# Patient Record
Sex: Male | Born: 1945
Health system: Southern US, Community
[De-identification: ages and names within clinical notes are randomized; demographics above are authoritative.]

## PROBLEM LIST (undated history)

## (undated) DIAGNOSIS — M1990 Unspecified osteoarthritis, unspecified site: Secondary | ICD-10-CM

## (undated) HISTORY — PX: CATARACT EXTRACTION, BILATERAL: SHX1313

---

## 2011-06-30 DIAGNOSIS — M069 Rheumatoid arthritis, unspecified: Secondary | ICD-10-CM | POA: Diagnosis not present

## 2011-10-29 DIAGNOSIS — M069 Rheumatoid arthritis, unspecified: Secondary | ICD-10-CM | POA: Diagnosis not present

## 2011-11-12 DIAGNOSIS — E785 Hyperlipidemia, unspecified: Secondary | ICD-10-CM | POA: Diagnosis not present

## 2011-11-12 DIAGNOSIS — Z125 Encounter for screening for malignant neoplasm of prostate: Secondary | ICD-10-CM | POA: Diagnosis not present

## 2011-11-12 DIAGNOSIS — J309 Allergic rhinitis, unspecified: Secondary | ICD-10-CM | POA: Diagnosis not present

## 2011-11-12 DIAGNOSIS — F17200 Nicotine dependence, unspecified, uncomplicated: Secondary | ICD-10-CM | POA: Diagnosis not present

## 2011-11-26 DIAGNOSIS — Z125 Encounter for screening for malignant neoplasm of prostate: Secondary | ICD-10-CM | POA: Diagnosis not present

## 2011-11-26 DIAGNOSIS — E785 Hyperlipidemia, unspecified: Secondary | ICD-10-CM | POA: Diagnosis not present

## 2011-11-26 DIAGNOSIS — F17200 Nicotine dependence, unspecified, uncomplicated: Secondary | ICD-10-CM | POA: Diagnosis not present

## 2011-11-26 DIAGNOSIS — Z0000 Encounter for general adult medical examination without abnormal findings: Secondary | ICD-10-CM | POA: Diagnosis not present

## 2011-11-26 DIAGNOSIS — Z23 Encounter for immunization: Secondary | ICD-10-CM | POA: Diagnosis not present

## 2011-11-26 DIAGNOSIS — E663 Overweight: Secondary | ICD-10-CM | POA: Diagnosis not present

## 2011-12-03 DIAGNOSIS — M069 Rheumatoid arthritis, unspecified: Secondary | ICD-10-CM | POA: Diagnosis not present

## 2011-12-03 DIAGNOSIS — J309 Allergic rhinitis, unspecified: Secondary | ICD-10-CM | POA: Diagnosis not present

## 2011-12-03 DIAGNOSIS — N182 Chronic kidney disease, stage 2 (mild): Secondary | ICD-10-CM | POA: Diagnosis not present

## 2011-12-03 DIAGNOSIS — F17200 Nicotine dependence, unspecified, uncomplicated: Secondary | ICD-10-CM | POA: Diagnosis not present

## 2011-12-10 DIAGNOSIS — Z1211 Encounter for screening for malignant neoplasm of colon: Secondary | ICD-10-CM | POA: Diagnosis not present

## 2011-12-23 DIAGNOSIS — K649 Unspecified hemorrhoids: Secondary | ICD-10-CM | POA: Diagnosis not present

## 2011-12-23 DIAGNOSIS — K5730 Diverticulosis of large intestine without perforation or abscess without bleeding: Secondary | ICD-10-CM | POA: Diagnosis not present

## 2011-12-23 DIAGNOSIS — D126 Benign neoplasm of colon, unspecified: Secondary | ICD-10-CM | POA: Diagnosis not present

## 2011-12-23 DIAGNOSIS — Z1211 Encounter for screening for malignant neoplasm of colon: Secondary | ICD-10-CM | POA: Diagnosis not present

## 2012-01-04 DIAGNOSIS — S61209A Unspecified open wound of unspecified finger without damage to nail, initial encounter: Secondary | ICD-10-CM | POA: Diagnosis not present

## 2012-02-02 DIAGNOSIS — M069 Rheumatoid arthritis, unspecified: Secondary | ICD-10-CM | POA: Diagnosis not present

## 2012-04-13 DIAGNOSIS — H2510 Age-related nuclear cataract, unspecified eye: Secondary | ICD-10-CM | POA: Diagnosis not present

## 2012-05-28 DIAGNOSIS — H269 Unspecified cataract: Secondary | ICD-10-CM | POA: Diagnosis not present

## 2012-05-28 DIAGNOSIS — H2510 Age-related nuclear cataract, unspecified eye: Secondary | ICD-10-CM | POA: Diagnosis not present

## 2012-06-03 DIAGNOSIS — M069 Rheumatoid arthritis, unspecified: Secondary | ICD-10-CM | POA: Diagnosis not present

## 2012-09-30 DIAGNOSIS — M069 Rheumatoid arthritis, unspecified: Secondary | ICD-10-CM | POA: Diagnosis not present

## 2012-09-30 DIAGNOSIS — M79609 Pain in unspecified limb: Secondary | ICD-10-CM | POA: Diagnosis not present

## 2012-10-06 DIAGNOSIS — M069 Rheumatoid arthritis, unspecified: Secondary | ICD-10-CM | POA: Diagnosis not present

## 2012-10-06 DIAGNOSIS — Z9221 Personal history of antineoplastic chemotherapy: Secondary | ICD-10-CM | POA: Diagnosis not present

## 2012-12-01 DIAGNOSIS — Z125 Encounter for screening for malignant neoplasm of prostate: Secondary | ICD-10-CM | POA: Diagnosis not present

## 2012-12-01 DIAGNOSIS — N182 Chronic kidney disease, stage 2 (mild): Secondary | ICD-10-CM | POA: Diagnosis not present

## 2012-12-01 DIAGNOSIS — Z1331 Encounter for screening for depression: Secondary | ICD-10-CM | POA: Diagnosis not present

## 2012-12-01 DIAGNOSIS — E785 Hyperlipidemia, unspecified: Secondary | ICD-10-CM | POA: Diagnosis not present

## 2012-12-01 DIAGNOSIS — Z0000 Encounter for general adult medical examination without abnormal findings: Secondary | ICD-10-CM | POA: Diagnosis not present

## 2012-12-01 DIAGNOSIS — Z23 Encounter for immunization: Secondary | ICD-10-CM | POA: Diagnosis not present

## 2012-12-08 DIAGNOSIS — F17200 Nicotine dependence, unspecified, uncomplicated: Secondary | ICD-10-CM | POA: Diagnosis not present

## 2012-12-08 DIAGNOSIS — N182 Chronic kidney disease, stage 2 (mild): Secondary | ICD-10-CM | POA: Diagnosis not present

## 2012-12-08 DIAGNOSIS — E785 Hyperlipidemia, unspecified: Secondary | ICD-10-CM | POA: Diagnosis not present

## 2012-12-08 DIAGNOSIS — J309 Allergic rhinitis, unspecified: Secondary | ICD-10-CM | POA: Diagnosis not present

## 2012-12-30 DIAGNOSIS — M069 Rheumatoid arthritis, unspecified: Secondary | ICD-10-CM | POA: Diagnosis not present

## 2013-03-31 DIAGNOSIS — M069 Rheumatoid arthritis, unspecified: Secondary | ICD-10-CM | POA: Diagnosis not present

## 2013-08-01 DIAGNOSIS — R059 Cough, unspecified: Secondary | ICD-10-CM | POA: Diagnosis not present

## 2013-08-01 DIAGNOSIS — R05 Cough: Secondary | ICD-10-CM | POA: Diagnosis not present

## 2013-08-01 DIAGNOSIS — J988 Other specified respiratory disorders: Secondary | ICD-10-CM | POA: Diagnosis not present

## 2013-08-01 DIAGNOSIS — M069 Rheumatoid arthritis, unspecified: Secondary | ICD-10-CM | POA: Diagnosis not present

## 2013-11-28 DIAGNOSIS — M069 Rheumatoid arthritis, unspecified: Secondary | ICD-10-CM | POA: Diagnosis not present

## 2013-12-08 DIAGNOSIS — W57XXXA Bitten or stung by nonvenomous insect and other nonvenomous arthropods, initial encounter: Secondary | ICD-10-CM | POA: Diagnosis not present

## 2013-12-08 DIAGNOSIS — E785 Hyperlipidemia, unspecified: Secondary | ICD-10-CM | POA: Diagnosis not present

## 2013-12-08 DIAGNOSIS — E669 Obesity, unspecified: Secondary | ICD-10-CM | POA: Diagnosis not present

## 2013-12-08 DIAGNOSIS — Z125 Encounter for screening for malignant neoplasm of prostate: Secondary | ICD-10-CM | POA: Diagnosis not present

## 2013-12-08 DIAGNOSIS — N182 Chronic kidney disease, stage 2 (mild): Secondary | ICD-10-CM | POA: Diagnosis not present

## 2013-12-08 DIAGNOSIS — Z1331 Encounter for screening for depression: Secondary | ICD-10-CM | POA: Diagnosis not present

## 2013-12-08 DIAGNOSIS — Z0000 Encounter for general adult medical examination without abnormal findings: Secondary | ICD-10-CM | POA: Diagnosis not present

## 2013-12-08 DIAGNOSIS — T148 Other injury of unspecified body region: Secondary | ICD-10-CM | POA: Diagnosis not present

## 2013-12-08 DIAGNOSIS — F17200 Nicotine dependence, unspecified, uncomplicated: Secondary | ICD-10-CM | POA: Diagnosis not present

## 2013-12-15 DIAGNOSIS — N182 Chronic kidney disease, stage 2 (mild): Secondary | ICD-10-CM | POA: Diagnosis not present

## 2013-12-15 DIAGNOSIS — F17200 Nicotine dependence, unspecified, uncomplicated: Secondary | ICD-10-CM | POA: Diagnosis not present

## 2013-12-15 DIAGNOSIS — J309 Allergic rhinitis, unspecified: Secondary | ICD-10-CM | POA: Diagnosis not present

## 2013-12-15 DIAGNOSIS — M069 Rheumatoid arthritis, unspecified: Secondary | ICD-10-CM | POA: Diagnosis not present

## 2014-04-03 DIAGNOSIS — M0589 Other rheumatoid arthritis with rheumatoid factor of multiple sites: Secondary | ICD-10-CM | POA: Diagnosis not present

## 2014-08-14 DIAGNOSIS — M25521 Pain in right elbow: Secondary | ICD-10-CM | POA: Diagnosis not present

## 2014-08-14 DIAGNOSIS — M0589 Other rheumatoid arthritis with rheumatoid factor of multiple sites: Secondary | ICD-10-CM | POA: Diagnosis not present

## 2014-12-05 DIAGNOSIS — L570 Actinic keratosis: Secondary | ICD-10-CM | POA: Diagnosis not present

## 2014-12-05 DIAGNOSIS — Z1283 Encounter for screening for malignant neoplasm of skin: Secondary | ICD-10-CM | POA: Diagnosis not present

## 2014-12-05 DIAGNOSIS — S80869A Insect bite (nonvenomous), unspecified lower leg, initial encounter: Secondary | ICD-10-CM | POA: Diagnosis not present

## 2014-12-12 DIAGNOSIS — Z1389 Encounter for screening for other disorder: Secondary | ICD-10-CM | POA: Diagnosis not present

## 2014-12-12 DIAGNOSIS — F17210 Nicotine dependence, cigarettes, uncomplicated: Secondary | ICD-10-CM | POA: Diagnosis not present

## 2014-12-12 DIAGNOSIS — Z23 Encounter for immunization: Secondary | ICD-10-CM | POA: Diagnosis not present

## 2014-12-12 DIAGNOSIS — N182 Chronic kidney disease, stage 2 (mild): Secondary | ICD-10-CM | POA: Diagnosis not present

## 2014-12-12 DIAGNOSIS — Z13820 Encounter for screening for osteoporosis: Secondary | ICD-10-CM | POA: Diagnosis not present

## 2014-12-12 DIAGNOSIS — E785 Hyperlipidemia, unspecified: Secondary | ICD-10-CM | POA: Diagnosis not present

## 2014-12-12 DIAGNOSIS — Z125 Encounter for screening for malignant neoplasm of prostate: Secondary | ICD-10-CM | POA: Diagnosis not present

## 2014-12-12 DIAGNOSIS — Z0000 Encounter for general adult medical examination without abnormal findings: Secondary | ICD-10-CM | POA: Diagnosis not present

## 2014-12-13 DIAGNOSIS — M0589 Other rheumatoid arthritis with rheumatoid factor of multiple sites: Secondary | ICD-10-CM | POA: Diagnosis not present

## 2014-12-19 DIAGNOSIS — N182 Chronic kidney disease, stage 2 (mild): Secondary | ICD-10-CM | POA: Diagnosis not present

## 2014-12-19 DIAGNOSIS — J449 Chronic obstructive pulmonary disease, unspecified: Secondary | ICD-10-CM | POA: Diagnosis not present

## 2014-12-19 DIAGNOSIS — M8580 Other specified disorders of bone density and structure, unspecified site: Secondary | ICD-10-CM | POA: Diagnosis not present

## 2014-12-19 DIAGNOSIS — F17210 Nicotine dependence, cigarettes, uncomplicated: Secondary | ICD-10-CM | POA: Diagnosis not present

## 2015-09-05 DIAGNOSIS — R05 Cough: Secondary | ICD-10-CM | POA: Diagnosis not present

## 2015-09-05 DIAGNOSIS — Z79899 Other long term (current) drug therapy: Secondary | ICD-10-CM | POA: Diagnosis not present

## 2015-09-05 DIAGNOSIS — R062 Wheezing: Secondary | ICD-10-CM | POA: Diagnosis not present

## 2015-09-05 DIAGNOSIS — M19041 Primary osteoarthritis, right hand: Secondary | ICD-10-CM | POA: Diagnosis not present

## 2015-09-05 DIAGNOSIS — M0589 Other rheumatoid arthritis with rheumatoid factor of multiple sites: Secondary | ICD-10-CM | POA: Diagnosis not present

## 2015-09-05 DIAGNOSIS — R683 Clubbing of fingers: Secondary | ICD-10-CM | POA: Diagnosis not present

## 2015-09-05 DIAGNOSIS — M19042 Primary osteoarthritis, left hand: Secondary | ICD-10-CM | POA: Diagnosis not present

## 2015-12-13 DIAGNOSIS — J302 Other seasonal allergic rhinitis: Secondary | ICD-10-CM | POA: Diagnosis not present

## 2015-12-13 DIAGNOSIS — Z0000 Encounter for general adult medical examination without abnormal findings: Secondary | ICD-10-CM | POA: Diagnosis not present

## 2015-12-13 DIAGNOSIS — Z1211 Encounter for screening for malignant neoplasm of colon: Secondary | ICD-10-CM | POA: Diagnosis not present

## 2015-12-13 DIAGNOSIS — Z1389 Encounter for screening for other disorder: Secondary | ICD-10-CM | POA: Diagnosis not present

## 2015-12-13 DIAGNOSIS — Z125 Encounter for screening for malignant neoplasm of prostate: Secondary | ICD-10-CM | POA: Diagnosis not present

## 2015-12-13 DIAGNOSIS — E663 Overweight: Secondary | ICD-10-CM | POA: Diagnosis not present

## 2015-12-13 DIAGNOSIS — E785 Hyperlipidemia, unspecified: Secondary | ICD-10-CM | POA: Diagnosis not present

## 2015-12-13 DIAGNOSIS — N182 Chronic kidney disease, stage 2 (mild): Secondary | ICD-10-CM | POA: Diagnosis not present

## 2015-12-19 DIAGNOSIS — R683 Clubbing of fingers: Secondary | ICD-10-CM | POA: Diagnosis not present

## 2015-12-19 DIAGNOSIS — Z79899 Other long term (current) drug therapy: Secondary | ICD-10-CM | POA: Diagnosis not present

## 2015-12-19 DIAGNOSIS — M0589 Other rheumatoid arthritis with rheumatoid factor of multiple sites: Secondary | ICD-10-CM | POA: Diagnosis not present

## 2015-12-19 DIAGNOSIS — R062 Wheezing: Secondary | ICD-10-CM | POA: Diagnosis not present

## 2015-12-20 DIAGNOSIS — F17210 Nicotine dependence, cigarettes, uncomplicated: Secondary | ICD-10-CM | POA: Diagnosis not present

## 2015-12-20 DIAGNOSIS — E785 Hyperlipidemia, unspecified: Secondary | ICD-10-CM | POA: Diagnosis not present

## 2015-12-20 DIAGNOSIS — J449 Chronic obstructive pulmonary disease, unspecified: Secondary | ICD-10-CM | POA: Diagnosis not present

## 2015-12-20 DIAGNOSIS — R0989 Other specified symptoms and signs involving the circulatory and respiratory systems: Secondary | ICD-10-CM | POA: Diagnosis not present

## 2015-12-20 DIAGNOSIS — N182 Chronic kidney disease, stage 2 (mild): Secondary | ICD-10-CM | POA: Diagnosis not present

## 2015-12-21 ENCOUNTER — Other Ambulatory Visit: Payer: Self-pay | Admitting: Internal Medicine

## 2015-12-21 DIAGNOSIS — R0989 Other specified symptoms and signs involving the circulatory and respiratory systems: Secondary | ICD-10-CM

## 2015-12-31 DIAGNOSIS — M059 Rheumatoid arthritis with rheumatoid factor, unspecified: Secondary | ICD-10-CM | POA: Diagnosis not present

## 2015-12-31 DIAGNOSIS — Z86010 Personal history of colonic polyps: Secondary | ICD-10-CM | POA: Diagnosis not present

## 2015-12-31 DIAGNOSIS — J309 Allergic rhinitis, unspecified: Secondary | ICD-10-CM | POA: Diagnosis not present

## 2016-01-07 ENCOUNTER — Ambulatory Visit
Admission: RE | Admit: 2016-01-07 | Discharge: 2016-01-07 | Disposition: A | Discharge status: Home / Self Care | Payer: Medicare Other | Source: Ambulatory Visit | Attending: Internal Medicine | Admitting: Internal Medicine

## 2016-01-07 DIAGNOSIS — R0989 Other specified symptoms and signs involving the circulatory and respiratory systems: Secondary | ICD-10-CM

## 2016-01-07 DIAGNOSIS — Z136 Encounter for screening for cardiovascular disorders: Secondary | ICD-10-CM | POA: Diagnosis not present

## 2016-01-07 DIAGNOSIS — I714 Abdominal aortic aneurysm, without rupture: Secondary | ICD-10-CM | POA: Diagnosis not present

## 2016-02-07 DIAGNOSIS — Z1211 Encounter for screening for malignant neoplasm of colon: Secondary | ICD-10-CM | POA: Diagnosis not present

## 2016-02-07 DIAGNOSIS — D123 Benign neoplasm of transverse colon: Secondary | ICD-10-CM | POA: Diagnosis not present

## 2016-02-07 DIAGNOSIS — K635 Polyp of colon: Secondary | ICD-10-CM | POA: Diagnosis not present

## 2016-02-07 DIAGNOSIS — Z86010 Personal history of colonic polyps: Secondary | ICD-10-CM | POA: Diagnosis not present

## 2016-03-20 DIAGNOSIS — Z79899 Other long term (current) drug therapy: Secondary | ICD-10-CM | POA: Diagnosis not present

## 2016-03-20 DIAGNOSIS — M0589 Other rheumatoid arthritis with rheumatoid factor of multiple sites: Secondary | ICD-10-CM | POA: Diagnosis not present

## 2016-03-20 DIAGNOSIS — R683 Clubbing of fingers: Secondary | ICD-10-CM | POA: Diagnosis not present

## 2016-03-20 DIAGNOSIS — R062 Wheezing: Secondary | ICD-10-CM | POA: Diagnosis not present

## 2016-06-19 DIAGNOSIS — M0589 Other rheumatoid arthritis with rheumatoid factor of multiple sites: Secondary | ICD-10-CM | POA: Diagnosis not present

## 2016-09-17 DIAGNOSIS — M0589 Other rheumatoid arthritis with rheumatoid factor of multiple sites: Secondary | ICD-10-CM | POA: Diagnosis not present

## 2016-09-17 DIAGNOSIS — M8580 Other specified disorders of bone density and structure, unspecified site: Secondary | ICD-10-CM | POA: Diagnosis not present

## 2016-09-17 DIAGNOSIS — Z79899 Other long term (current) drug therapy: Secondary | ICD-10-CM | POA: Diagnosis not present

## 2016-12-15 DIAGNOSIS — M0589 Other rheumatoid arthritis with rheumatoid factor of multiple sites: Secondary | ICD-10-CM | POA: Diagnosis not present

## 2017-01-01 DIAGNOSIS — M859 Disorder of bone density and structure, unspecified: Secondary | ICD-10-CM | POA: Diagnosis not present

## 2017-01-01 DIAGNOSIS — Z125 Encounter for screening for malignant neoplasm of prostate: Secondary | ICD-10-CM | POA: Diagnosis not present

## 2017-01-01 DIAGNOSIS — M8580 Other specified disorders of bone density and structure, unspecified site: Secondary | ICD-10-CM | POA: Diagnosis not present

## 2017-01-01 DIAGNOSIS — E785 Hyperlipidemia, unspecified: Secondary | ICD-10-CM | POA: Diagnosis not present

## 2017-01-08 DIAGNOSIS — N182 Chronic kidney disease, stage 2 (mild): Secondary | ICD-10-CM | POA: Diagnosis not present

## 2017-01-08 DIAGNOSIS — I700 Atherosclerosis of aorta: Secondary | ICD-10-CM | POA: Diagnosis not present

## 2017-01-08 DIAGNOSIS — M0589 Other rheumatoid arthritis with rheumatoid factor of multiple sites: Secondary | ICD-10-CM | POA: Diagnosis not present

## 2017-01-08 DIAGNOSIS — M8580 Other specified disorders of bone density and structure, unspecified site: Secondary | ICD-10-CM | POA: Diagnosis not present

## 2017-03-19 DIAGNOSIS — M0589 Other rheumatoid arthritis with rheumatoid factor of multiple sites: Secondary | ICD-10-CM | POA: Diagnosis not present

## 2017-03-19 DIAGNOSIS — Z79899 Other long term (current) drug therapy: Secondary | ICD-10-CM | POA: Diagnosis not present

## 2017-03-19 DIAGNOSIS — M8580 Other specified disorders of bone density and structure, unspecified site: Secondary | ICD-10-CM | POA: Diagnosis not present

## 2017-06-18 DIAGNOSIS — M0589 Other rheumatoid arthritis with rheumatoid factor of multiple sites: Secondary | ICD-10-CM | POA: Diagnosis not present

## 2017-09-16 DIAGNOSIS — M8580 Other specified disorders of bone density and structure, unspecified site: Secondary | ICD-10-CM | POA: Diagnosis not present

## 2017-09-16 DIAGNOSIS — Z79899 Other long term (current) drug therapy: Secondary | ICD-10-CM | POA: Diagnosis not present

## 2017-09-16 DIAGNOSIS — M0589 Other rheumatoid arthritis with rheumatoid factor of multiple sites: Secondary | ICD-10-CM | POA: Diagnosis not present

## 2017-09-16 DIAGNOSIS — M1990 Unspecified osteoarthritis, unspecified site: Secondary | ICD-10-CM | POA: Diagnosis not present

## 2017-09-16 DIAGNOSIS — M25562 Pain in left knee: Secondary | ICD-10-CM | POA: Diagnosis not present

## 2018-01-05 DIAGNOSIS — E785 Hyperlipidemia, unspecified: Secondary | ICD-10-CM | POA: Diagnosis not present

## 2018-01-05 DIAGNOSIS — M0589 Other rheumatoid arthritis with rheumatoid factor of multiple sites: Secondary | ICD-10-CM | POA: Diagnosis not present

## 2018-01-05 DIAGNOSIS — Z79899 Other long term (current) drug therapy: Secondary | ICD-10-CM | POA: Diagnosis not present

## 2018-01-05 DIAGNOSIS — Z125 Encounter for screening for malignant neoplasm of prostate: Secondary | ICD-10-CM | POA: Diagnosis not present

## 2018-01-12 DIAGNOSIS — Z0000 Encounter for general adult medical examination without abnormal findings: Secondary | ICD-10-CM | POA: Diagnosis not present

## 2018-01-12 DIAGNOSIS — M0589 Other rheumatoid arthritis with rheumatoid factor of multiple sites: Secondary | ICD-10-CM | POA: Diagnosis not present

## 2018-01-12 DIAGNOSIS — N182 Chronic kidney disease, stage 2 (mild): Secondary | ICD-10-CM | POA: Diagnosis not present

## 2018-01-12 DIAGNOSIS — E785 Hyperlipidemia, unspecified: Secondary | ICD-10-CM | POA: Diagnosis not present

## 2018-03-17 DIAGNOSIS — M1990 Unspecified osteoarthritis, unspecified site: Secondary | ICD-10-CM | POA: Diagnosis not present

## 2018-03-17 DIAGNOSIS — M25562 Pain in left knee: Secondary | ICD-10-CM | POA: Diagnosis not present

## 2018-03-17 DIAGNOSIS — M0589 Other rheumatoid arthritis with rheumatoid factor of multiple sites: Secondary | ICD-10-CM | POA: Diagnosis not present

## 2018-03-17 DIAGNOSIS — Z79899 Other long term (current) drug therapy: Secondary | ICD-10-CM | POA: Diagnosis not present

## 2018-03-17 DIAGNOSIS — M8580 Other specified disorders of bone density and structure, unspecified site: Secondary | ICD-10-CM | POA: Diagnosis not present

## 2018-04-13 DIAGNOSIS — M898X1 Other specified disorders of bone, shoulder: Secondary | ICD-10-CM | POA: Diagnosis not present

## 2018-04-13 DIAGNOSIS — Z79899 Other long term (current) drug therapy: Secondary | ICD-10-CM | POA: Diagnosis not present

## 2018-04-13 DIAGNOSIS — M25551 Pain in right hip: Secondary | ICD-10-CM | POA: Diagnosis not present

## 2018-04-13 DIAGNOSIS — M4185 Other forms of scoliosis, thoracolumbar region: Secondary | ICD-10-CM | POA: Diagnosis not present

## 2018-04-13 DIAGNOSIS — M1990 Unspecified osteoarthritis, unspecified site: Secondary | ICD-10-CM | POA: Diagnosis not present

## 2018-04-13 DIAGNOSIS — M545 Low back pain: Secondary | ICD-10-CM | POA: Diagnosis not present

## 2018-04-13 DIAGNOSIS — M160 Bilateral primary osteoarthritis of hip: Secondary | ICD-10-CM | POA: Diagnosis not present

## 2018-04-13 DIAGNOSIS — M25562 Pain in left knee: Secondary | ICD-10-CM | POA: Diagnosis not present

## 2018-04-13 DIAGNOSIS — M5135 Other intervertebral disc degeneration, thoracolumbar region: Secondary | ICD-10-CM | POA: Diagnosis not present

## 2018-04-13 DIAGNOSIS — M549 Dorsalgia, unspecified: Secondary | ICD-10-CM | POA: Diagnosis not present

## 2018-04-13 DIAGNOSIS — M47815 Spondylosis without myelopathy or radiculopathy, thoracolumbar region: Secondary | ICD-10-CM | POA: Diagnosis not present

## 2018-04-13 DIAGNOSIS — M0589 Other rheumatoid arthritis with rheumatoid factor of multiple sites: Secondary | ICD-10-CM | POA: Diagnosis not present

## 2018-04-13 DIAGNOSIS — M8580 Other specified disorders of bone density and structure, unspecified site: Secondary | ICD-10-CM | POA: Diagnosis not present

## 2018-05-12 DIAGNOSIS — M8580 Other specified disorders of bone density and structure, unspecified site: Secondary | ICD-10-CM | POA: Diagnosis not present

## 2018-05-12 DIAGNOSIS — M545 Low back pain: Secondary | ICD-10-CM | POA: Diagnosis not present

## 2018-05-12 DIAGNOSIS — Z79899 Other long term (current) drug therapy: Secondary | ICD-10-CM | POA: Diagnosis not present

## 2018-05-12 DIAGNOSIS — M25562 Pain in left knee: Secondary | ICD-10-CM | POA: Diagnosis not present

## 2018-05-12 DIAGNOSIS — M1990 Unspecified osteoarthritis, unspecified site: Secondary | ICD-10-CM | POA: Diagnosis not present

## 2018-05-12 DIAGNOSIS — M25551 Pain in right hip: Secondary | ICD-10-CM | POA: Diagnosis not present

## 2018-05-12 DIAGNOSIS — M0589 Other rheumatoid arthritis with rheumatoid factor of multiple sites: Secondary | ICD-10-CM | POA: Diagnosis not present

## 2018-05-19 DIAGNOSIS — M545 Low back pain: Secondary | ICD-10-CM | POA: Diagnosis not present

## 2018-05-19 DIAGNOSIS — M5431 Sciatica, right side: Secondary | ICD-10-CM | POA: Diagnosis not present

## 2018-05-19 DIAGNOSIS — M25512 Pain in left shoulder: Secondary | ICD-10-CM | POA: Diagnosis not present

## 2018-05-25 ENCOUNTER — Telehealth: Payer: Self-pay | Admitting: Nurse Practitioner

## 2018-05-25 ENCOUNTER — Other Ambulatory Visit: Payer: Self-pay | Admitting: Orthopedic Surgery

## 2018-05-25 DIAGNOSIS — M545 Low back pain, unspecified: Secondary | ICD-10-CM

## 2018-05-25 DIAGNOSIS — G8929 Other chronic pain: Secondary | ICD-10-CM

## 2018-05-25 NOTE — Telephone Encounter (Signed)
Phone call to patient to verify medication list and allergies for myelogram procedure, spoke with wife. Pt aware he will not need to hold any medications for this appointment.

## 2018-06-02 ENCOUNTER — Ambulatory Visit
Admission: RE | Admit: 2018-06-02 | Discharge: 2018-06-02 | Disposition: A | Discharge status: Home / Self Care | Payer: Medicare Other | Source: Ambulatory Visit | Attending: Orthopedic Surgery | Admitting: Orthopedic Surgery

## 2018-06-02 DIAGNOSIS — M5126 Other intervertebral disc displacement, lumbar region: Secondary | ICD-10-CM | POA: Diagnosis not present

## 2018-06-02 DIAGNOSIS — G8929 Other chronic pain: Secondary | ICD-10-CM

## 2018-06-02 DIAGNOSIS — M545 Low back pain: Principal | ICD-10-CM

## 2018-06-02 MED ORDER — MEPERIDINE HCL 100 MG/ML IJ SOLN
75.0000 mg | Freq: Once | INTRAMUSCULAR | Status: AC
  Administered 2018-06-02: 75 mg via INTRAMUSCULAR

## 2018-06-02 MED ORDER — ONDANSETRON HCL 4 MG/2ML IJ SOLN
4.0000 mg | Freq: Once | INTRAMUSCULAR | Status: AC
  Administered 2018-06-02: 4 mg via INTRAMUSCULAR

## 2018-06-02 MED ORDER — DIAZEPAM 5 MG PO TABS
5.0000 mg | ORAL_TABLET | Freq: Once | ORAL | Status: AC
  Administered 2018-06-02: 5 mg via ORAL

## 2018-06-02 MED ORDER — IOPAMIDOL (ISOVUE-M 200) INJECTION 41%
20.0000 mL | Freq: Once | INTRAMUSCULAR | Status: AC
  Administered 2018-06-02: 20 mL via INTRATHECAL

## 2018-06-02 MED ORDER — ONDANSETRON HCL 4 MG/2ML IJ SOLN: 4.0000 mg | Freq: Four times a day (QID) | INTRAMUSCULAR | Status: DC | PRN

## 2018-06-02 NOTE — Discharge Instructions (Signed)

## 2018-06-03 DIAGNOSIS — M5431 Sciatica, right side: Secondary | ICD-10-CM | POA: Diagnosis not present

## 2018-06-03 DIAGNOSIS — M25512 Pain in left shoulder: Secondary | ICD-10-CM | POA: Diagnosis not present

## 2018-06-04 ENCOUNTER — Inpatient Hospital Stay (HOSPITAL_COMMUNITY)
Admission: AD | Admit: 2018-06-04 | Discharge: 2018-06-29 | DRG: 456 | Disposition: A | Discharge status: Rehabilitation Facility | Payer: Medicare Other | Source: Ambulatory Visit | Attending: Orthopedic Surgery | Admitting: Orthopedic Surgery

## 2018-06-04 ENCOUNTER — Encounter (HOSPITAL_COMMUNITY): Payer: Self-pay

## 2018-06-04 ENCOUNTER — Other Ambulatory Visit: Payer: Self-pay

## 2018-06-04 ENCOUNTER — Inpatient Hospital Stay (HOSPITAL_COMMUNITY): Payer: Medicare Other

## 2018-06-04 DIAGNOSIS — R627 Adult failure to thrive: Secondary | ICD-10-CM | POA: Diagnosis present

## 2018-06-04 DIAGNOSIS — D492 Neoplasm of unspecified behavior of bone, soft tissue, and skin: Secondary | ICD-10-CM | POA: Diagnosis not present

## 2018-06-04 DIAGNOSIS — I82502 Chronic embolism and thrombosis of unspecified deep veins of left lower extremity: Secondary | ICD-10-CM | POA: Diagnosis not present

## 2018-06-04 DIAGNOSIS — Z66 Do not resuscitate: Secondary | ICD-10-CM | POA: Diagnosis not present

## 2018-06-04 DIAGNOSIS — M8448XA Pathological fracture, other site, initial encounter for fracture: Secondary | ICD-10-CM | POA: Diagnosis not present

## 2018-06-04 DIAGNOSIS — C7951 Secondary malignant neoplasm of bone: Secondary | ICD-10-CM | POA: Diagnosis not present

## 2018-06-04 DIAGNOSIS — Z79899 Other long term (current) drug therapy: Secondary | ICD-10-CM

## 2018-06-04 DIAGNOSIS — R609 Edema, unspecified: Secondary | ICD-10-CM | POA: Diagnosis not present

## 2018-06-04 DIAGNOSIS — M8448XD Pathological fracture, other site, subsequent encounter for fracture with routine healing: Secondary | ICD-10-CM | POA: Diagnosis present

## 2018-06-04 DIAGNOSIS — D638 Anemia in other chronic diseases classified elsewhere: Secondary | ICD-10-CM | POA: Diagnosis present

## 2018-06-04 DIAGNOSIS — R29890 Loss of height: Secondary | ICD-10-CM | POA: Diagnosis present

## 2018-06-04 DIAGNOSIS — C3431 Malignant neoplasm of lower lobe, right bronchus or lung: Secondary | ICD-10-CM | POA: Diagnosis not present

## 2018-06-04 DIAGNOSIS — Z923 Personal history of irradiation: Secondary | ICD-10-CM | POA: Diagnosis not present

## 2018-06-04 DIAGNOSIS — S36892A Contusion of other intra-abdominal organs, initial encounter: Secondary | ICD-10-CM | POA: Diagnosis not present

## 2018-06-04 DIAGNOSIS — M8458XD Pathological fracture in neoplastic disease, other specified site, subsequent encounter for fracture with routine healing: Secondary | ICD-10-CM | POA: Diagnosis not present

## 2018-06-04 DIAGNOSIS — C3490 Malignant neoplasm of unspecified part of unspecified bronchus or lung: Secondary | ICD-10-CM | POA: Diagnosis not present

## 2018-06-04 DIAGNOSIS — M48061 Spinal stenosis, lumbar region without neurogenic claudication: Secondary | ICD-10-CM | POA: Diagnosis not present

## 2018-06-04 DIAGNOSIS — K5900 Constipation, unspecified: Secondary | ICD-10-CM | POA: Diagnosis present

## 2018-06-04 DIAGNOSIS — M62838 Other muscle spasm: Secondary | ICD-10-CM | POA: Diagnosis not present

## 2018-06-04 DIAGNOSIS — Z882 Allergy status to sulfonamides status: Secondary | ICD-10-CM

## 2018-06-04 DIAGNOSIS — I959 Hypotension, unspecified: Secondary | ICD-10-CM | POA: Diagnosis not present

## 2018-06-04 DIAGNOSIS — Z981 Arthrodesis status: Secondary | ICD-10-CM

## 2018-06-04 DIAGNOSIS — R918 Other nonspecific abnormal finding of lung field: Secondary | ICD-10-CM | POA: Diagnosis not present

## 2018-06-04 DIAGNOSIS — R52 Pain, unspecified: Secondary | ICD-10-CM | POA: Diagnosis not present

## 2018-06-04 DIAGNOSIS — F17210 Nicotine dependence, cigarettes, uncomplicated: Secondary | ICD-10-CM | POA: Diagnosis present

## 2018-06-04 DIAGNOSIS — C787 Secondary malignant neoplasm of liver and intrahepatic bile duct: Secondary | ICD-10-CM | POA: Diagnosis not present

## 2018-06-04 DIAGNOSIS — Z720 Tobacco use: Secondary | ICD-10-CM | POA: Diagnosis present

## 2018-06-04 DIAGNOSIS — J9690 Respiratory failure, unspecified, unspecified whether with hypoxia or hypercapnia: Secondary | ICD-10-CM

## 2018-06-04 DIAGNOSIS — Z515 Encounter for palliative care: Secondary | ICD-10-CM | POA: Diagnosis not present

## 2018-06-04 DIAGNOSIS — Z7982 Long term (current) use of aspirin: Secondary | ICD-10-CM

## 2018-06-04 DIAGNOSIS — H409 Unspecified glaucoma: Secondary | ICD-10-CM | POA: Diagnosis present

## 2018-06-04 DIAGNOSIS — M545 Low back pain: Secondary | ICD-10-CM | POA: Diagnosis not present

## 2018-06-04 DIAGNOSIS — I824Y9 Acute embolism and thrombosis of unspecified deep veins of unspecified proximal lower extremity: Secondary | ICD-10-CM | POA: Diagnosis not present

## 2018-06-04 DIAGNOSIS — Z885 Allergy status to narcotic agent status: Secondary | ICD-10-CM | POA: Diagnosis not present

## 2018-06-04 DIAGNOSIS — J40 Bronchitis, not specified as acute or chronic: Secondary | ICD-10-CM

## 2018-06-04 DIAGNOSIS — M4326 Fusion of spine, lumbar region: Secondary | ICD-10-CM | POA: Diagnosis not present

## 2018-06-04 DIAGNOSIS — G8918 Other acute postprocedural pain: Secondary | ICD-10-CM | POA: Diagnosis not present

## 2018-06-04 DIAGNOSIS — K661 Hemoperitoneum: Secondary | ICD-10-CM | POA: Diagnosis not present

## 2018-06-04 DIAGNOSIS — C3400 Malignant neoplasm of unspecified main bronchus: Secondary | ICD-10-CM | POA: Diagnosis not present

## 2018-06-04 DIAGNOSIS — J9811 Atelectasis: Secondary | ICD-10-CM | POA: Diagnosis not present

## 2018-06-04 DIAGNOSIS — Z4659 Encounter for fitting and adjustment of other gastrointestinal appliance and device: Secondary | ICD-10-CM

## 2018-06-04 DIAGNOSIS — M48062 Spinal stenosis, lumbar region with neurogenic claudication: Secondary | ICD-10-CM | POA: Diagnosis not present

## 2018-06-04 DIAGNOSIS — M8468XA Pathological fracture in other disease, other site, initial encounter for fracture: Secondary | ICD-10-CM | POA: Diagnosis not present

## 2018-06-04 DIAGNOSIS — K219 Gastro-esophageal reflux disease without esophagitis: Secondary | ICD-10-CM | POA: Diagnosis present

## 2018-06-04 DIAGNOSIS — K8020 Calculus of gallbladder without cholecystitis without obstruction: Secondary | ICD-10-CM | POA: Diagnosis not present

## 2018-06-04 DIAGNOSIS — B9620 Unspecified Escherichia coli [E. coli] as the cause of diseases classified elsewhere: Secondary | ICD-10-CM | POA: Diagnosis not present

## 2018-06-04 DIAGNOSIS — T8141XA Infection following a procedure, superficial incisional surgical site, initial encounter: Secondary | ICD-10-CM | POA: Diagnosis not present

## 2018-06-04 DIAGNOSIS — I82412 Acute embolism and thrombosis of left femoral vein: Secondary | ICD-10-CM | POA: Diagnosis not present

## 2018-06-04 DIAGNOSIS — C799 Secondary malignant neoplasm of unspecified site: Secondary | ICD-10-CM | POA: Diagnosis not present

## 2018-06-04 DIAGNOSIS — M069 Rheumatoid arthritis, unspecified: Secondary | ICD-10-CM | POA: Diagnosis not present

## 2018-06-04 DIAGNOSIS — Z8249 Family history of ischemic heart disease and other diseases of the circulatory system: Secondary | ICD-10-CM | POA: Diagnosis not present

## 2018-06-04 DIAGNOSIS — C3491 Malignant neoplasm of unspecified part of right bronchus or lung: Secondary | ICD-10-CM | POA: Diagnosis present

## 2018-06-04 DIAGNOSIS — L259 Unspecified contact dermatitis, unspecified cause: Secondary | ICD-10-CM | POA: Diagnosis not present

## 2018-06-04 DIAGNOSIS — T8130XA Disruption of wound, unspecified, initial encounter: Secondary | ICD-10-CM | POA: Diagnosis not present

## 2018-06-04 DIAGNOSIS — R000 Tachycardia, unspecified: Secondary | ICD-10-CM | POA: Diagnosis not present

## 2018-06-04 DIAGNOSIS — M8458XA Pathological fracture in neoplastic disease, other specified site, initial encounter for fracture: Secondary | ICD-10-CM | POA: Diagnosis present

## 2018-06-04 DIAGNOSIS — Z1611 Resistance to penicillins: Secondary | ICD-10-CM | POA: Diagnosis not present

## 2018-06-04 DIAGNOSIS — Z01818 Encounter for other preprocedural examination: Secondary | ICD-10-CM | POA: Diagnosis not present

## 2018-06-04 DIAGNOSIS — T8149XA Infection following a procedure, other surgical site, initial encounter: Secondary | ICD-10-CM | POA: Diagnosis not present

## 2018-06-04 DIAGNOSIS — Z888 Allergy status to other drugs, medicaments and biological substances status: Secondary | ICD-10-CM | POA: Diagnosis not present

## 2018-06-04 DIAGNOSIS — Z978 Presence of other specified devices: Secondary | ICD-10-CM

## 2018-06-04 DIAGNOSIS — M4856XS Collapsed vertebra, not elsewhere classified, lumbar region, sequela of fracture: Secondary | ICD-10-CM | POA: Diagnosis not present

## 2018-06-04 DIAGNOSIS — J95821 Acute postprocedural respiratory failure: Secondary | ICD-10-CM | POA: Diagnosis not present

## 2018-06-04 DIAGNOSIS — G893 Neoplasm related pain (acute) (chronic): Secondary | ICD-10-CM | POA: Diagnosis not present

## 2018-06-04 DIAGNOSIS — D72829 Elevated white blood cell count, unspecified: Secondary | ICD-10-CM | POA: Diagnosis not present

## 2018-06-04 DIAGNOSIS — E876 Hypokalemia: Secondary | ICD-10-CM | POA: Diagnosis not present

## 2018-06-04 DIAGNOSIS — Z833 Family history of diabetes mellitus: Secondary | ICD-10-CM | POA: Diagnosis not present

## 2018-06-04 DIAGNOSIS — J9602 Acute respiratory failure with hypercapnia: Secondary | ICD-10-CM | POA: Diagnosis not present

## 2018-06-04 DIAGNOSIS — K769 Liver disease, unspecified: Secondary | ICD-10-CM | POA: Diagnosis not present

## 2018-06-04 DIAGNOSIS — C412 Malignant neoplasm of vertebral column: Secondary | ICD-10-CM | POA: Diagnosis not present

## 2018-06-04 DIAGNOSIS — M792 Neuralgia and neuritis, unspecified: Secondary | ICD-10-CM | POA: Diagnosis not present

## 2018-06-04 DIAGNOSIS — E871 Hypo-osmolality and hyponatremia: Secondary | ICD-10-CM | POA: Diagnosis not present

## 2018-06-04 DIAGNOSIS — C801 Malignant (primary) neoplasm, unspecified: Secondary | ICD-10-CM | POA: Diagnosis not present

## 2018-06-04 DIAGNOSIS — C7989 Secondary malignant neoplasm of other specified sites: Secondary | ICD-10-CM | POA: Diagnosis not present

## 2018-06-04 DIAGNOSIS — D62 Acute posthemorrhagic anemia: Secondary | ICD-10-CM | POA: Diagnosis not present

## 2018-06-04 DIAGNOSIS — J439 Emphysema, unspecified: Secondary | ICD-10-CM | POA: Diagnosis present

## 2018-06-04 DIAGNOSIS — R197 Diarrhea, unspecified: Secondary | ICD-10-CM | POA: Diagnosis not present

## 2018-06-04 DIAGNOSIS — D649 Anemia, unspecified: Secondary | ICD-10-CM

## 2018-06-04 DIAGNOSIS — R21 Rash and other nonspecific skin eruption: Secondary | ICD-10-CM

## 2018-06-04 DIAGNOSIS — Z79891 Long term (current) use of opiate analgesic: Secondary | ICD-10-CM

## 2018-06-04 DIAGNOSIS — M25551 Pain in right hip: Secondary | ICD-10-CM | POA: Diagnosis not present

## 2018-06-04 DIAGNOSIS — I9762 Postprocedural hemorrhage of a circulatory system organ or structure following other procedure: Secondary | ICD-10-CM | POA: Diagnosis not present

## 2018-06-04 DIAGNOSIS — Y838 Other surgical procedures as the cause of abnormal reaction of the patient, or of later complication, without mention of misadventure at the time of the procedure: Secondary | ICD-10-CM | POA: Diagnosis present

## 2018-06-04 DIAGNOSIS — R0989 Other specified symptoms and signs involving the circulatory and respiratory systems: Secondary | ICD-10-CM | POA: Diagnosis not present

## 2018-06-04 DIAGNOSIS — J9600 Acute respiratory failure, unspecified whether with hypoxia or hypercapnia: Secondary | ICD-10-CM | POA: Diagnosis not present

## 2018-06-04 DIAGNOSIS — C7801 Secondary malignant neoplasm of right lung: Secondary | ICD-10-CM | POA: Diagnosis not present

## 2018-06-04 DIAGNOSIS — R5081 Fever presenting with conditions classified elsewhere: Secondary | ICD-10-CM | POA: Diagnosis not present

## 2018-06-04 DIAGNOSIS — G479 Sleep disorder, unspecified: Secondary | ICD-10-CM | POA: Diagnosis not present

## 2018-06-04 DIAGNOSIS — Z419 Encounter for procedure for purposes other than remedying health state, unspecified: Secondary | ICD-10-CM

## 2018-06-04 DIAGNOSIS — J9601 Acute respiratory failure with hypoxia: Secondary | ICD-10-CM | POA: Diagnosis not present

## 2018-06-04 DIAGNOSIS — E46 Unspecified protein-calorie malnutrition: Secondary | ICD-10-CM | POA: Diagnosis present

## 2018-06-04 DIAGNOSIS — Z7189 Other specified counseling: Secondary | ICD-10-CM | POA: Diagnosis not present

## 2018-06-04 DIAGNOSIS — Z4682 Encounter for fitting and adjustment of non-vascular catheter: Secondary | ICD-10-CM | POA: Diagnosis not present

## 2018-06-04 DIAGNOSIS — Z881 Allergy status to other antibiotic agents status: Secondary | ICD-10-CM | POA: Diagnosis not present

## 2018-06-04 DIAGNOSIS — I951 Orthostatic hypotension: Secondary | ICD-10-CM | POA: Diagnosis not present

## 2018-06-04 DIAGNOSIS — Z9889 Other specified postprocedural states: Secondary | ICD-10-CM | POA: Diagnosis not present

## 2018-06-04 DIAGNOSIS — Z91048 Other nonmedicinal substance allergy status: Secondary | ICD-10-CM | POA: Diagnosis not present

## 2018-06-04 DIAGNOSIS — G629 Polyneuropathy, unspecified: Secondary | ICD-10-CM | POA: Diagnosis present

## 2018-06-04 DIAGNOSIS — B961 Klebsiella pneumoniae [K. pneumoniae] as the cause of diseases classified elsewhere: Secondary | ICD-10-CM | POA: Diagnosis not present

## 2018-06-04 DIAGNOSIS — I714 Abdominal aortic aneurysm, without rupture: Secondary | ICD-10-CM | POA: Diagnosis present

## 2018-06-04 DIAGNOSIS — M5416 Radiculopathy, lumbar region: Secondary | ICD-10-CM | POA: Diagnosis not present

## 2018-06-04 DIAGNOSIS — M7981 Nontraumatic hematoma of soft tissue: Secondary | ICD-10-CM

## 2018-06-04 HISTORY — DX: Unspecified osteoarthritis, unspecified site: M19.90

## 2018-06-04 LAB — COMPREHENSIVE METABOLIC PANEL
ALT: 20 U/L (ref 0–44)
AST: 23 U/L (ref 15–41)
Albumin: 3.4 g/dL — ABNORMAL LOW (ref 3.5–5.0)
Alkaline Phosphatase: 99 U/L (ref 38–126)
Anion gap: 11 (ref 5–15)
BUN: 13 mg/dL (ref 8–23)
CO2: 24 mmol/L (ref 22–32)
Calcium: 9.2 mg/dL (ref 8.9–10.3)
Chloride: 101 mmol/L (ref 98–111)
Creatinine, Ser: 0.84 mg/dL (ref 0.61–1.24)
GFR calc Af Amer: >60 mL/min (ref >60)
GFR calc non Af Amer: >60 mL/min (ref >60)
Glucose, Bld: 104 mg/dL — ABNORMAL HIGH (ref 70–99)
Potassium: 3.1 mmol/L — ABNORMAL LOW (ref 3.5–5.1)
Sodium: 136 mmol/L (ref 135–145)
Total Bilirubin: 1.2 mg/dL (ref 0.3–1.2)
Total Protein: 6.8 g/dL (ref 6.5–8.1)

## 2018-06-04 LAB — CBC WITH DIFFERENTIAL/PLATELET
Abs Immature Granulocytes: 0.11 10*3/uL — ABNORMAL HIGH (ref 0.00–0.07)
Basophils Absolute: 0.1 10*3/uL (ref 0.0–0.1)
Basophils Relative: 1 %
Eosinophils Absolute: 0.1 10*3/uL (ref 0.0–0.5)
Eosinophils Relative: 1 %
HCT: 40.6 % (ref 39.0–52.0)
Hemoglobin: 13.4 g/dL (ref 13.0–17.0)
Immature Granulocytes: 1 %
Lymphocytes Relative: 16 %
Lymphs Abs: 2 10*3/uL (ref 0.7–4.0)
MCH: 31 pg (ref 26.0–34.0)
MCHC: 33 g/dL (ref 30.0–36.0)
MCV: 94 fL (ref 80.0–100.0)
Monocytes Absolute: 0.7 10*3/uL (ref 0.1–1.0)
Monocytes Relative: 6 %
Neutro Abs: 9.8 10*3/uL — ABNORMAL HIGH (ref 1.7–7.7)
Neutrophils Relative %: 75 %
Platelets: 287 10*3/uL (ref 150–400)
RBC: 4.32 MIL/uL (ref 4.22–5.81)
RDW: 14.5 % (ref 11.5–15.5)
WBC: 12.8 10*3/uL — ABNORMAL HIGH (ref 4.0–10.5)
nRBC: 0 % (ref 0.0–0.2)

## 2018-06-04 LAB — SEDIMENTATION RATE: Sed Rate: 66 mm/hr — ABNORMAL HIGH (ref 0–16)

## 2018-06-04 LAB — URIC ACID: Uric Acid, Serum: 4.6 mg/dL (ref 3.7–8.6)

## 2018-06-04 LAB — PROTIME-INR
INR: 0.99
Prothrombin Time: 13 seconds (ref 11.4–15.2)

## 2018-06-04 LAB — C-REACTIVE PROTEIN: CRP: 11.9 mg/dL — ABNORMAL HIGH (ref <1.0)

## 2018-06-04 LAB — APTT: aPTT: 30 seconds (ref 24–36)

## 2018-06-04 MED ORDER — ONDANSETRON HCL 4 MG/2ML IJ SOLN
4.0000 mg | Freq: Four times a day (QID) | INTRAMUSCULAR | Status: DC | PRN
  Administered 2018-06-04 – 2018-06-10 (×7): 4 mg via INTRAVENOUS
  Filled 2018-06-04 (×7): qty 2

## 2018-06-04 MED ORDER — SODIUM CHLORIDE 0.9% FLUSH: 3.0000 mL | INTRAVENOUS | Status: DC | PRN

## 2018-06-04 MED ORDER — FOLIC ACID 1 MG PO TABS
1.0000 mg | ORAL_TABLET | Freq: Every day | ORAL | Status: DC
  Administered 2018-06-04 – 2018-06-09 (×5): 1 mg via ORAL
  Filled 2018-06-04 (×6): qty 1

## 2018-06-04 MED ORDER — ASPIRIN 81 MG PO CHEW
81.0000 mg | CHEWABLE_TABLET | Freq: Every day | ORAL | Status: DC
  Administered 2018-06-04 – 2018-06-07 (×4): 81 mg via ORAL
  Filled 2018-06-04 (×5): qty 1

## 2018-06-04 MED ORDER — IOHEXOL 300 MG/ML  SOLN
30.0000 mL | Freq: Once | INTRAMUSCULAR | Status: AC | PRN
  Administered 2018-06-04: 30 mL via ORAL

## 2018-06-04 MED ORDER — HYDROMORPHONE HCL 2 MG PO TABS
2.0000 mg | ORAL_TABLET | Freq: Four times a day (QID) | ORAL | Status: DC | PRN
  Administered 2018-06-04 – 2018-06-10 (×7): 4 mg via ORAL
  Filled 2018-06-04 (×7): qty 2
  Filled 2018-06-04: qty 1
  Filled 2018-06-04: qty 2

## 2018-06-04 MED ORDER — POTASSIUM 99 MG PO TABS: 99.0000 mg | ORAL_TABLET | Freq: Every day | ORAL | Status: DC | PRN

## 2018-06-04 MED ORDER — POLYETHYLENE GLYCOL 3350 17 G PO PACK
17.0000 g | PACK | Freq: Every day | ORAL | Status: DC | PRN
  Administered 2018-06-05 – 2018-06-07 (×2): 17 g via ORAL
  Filled 2018-06-04 (×3): qty 1

## 2018-06-04 MED ORDER — ALUM & MAG HYDROXIDE-SIMETH 200-200-20 MG/5ML PO SUSP
15.0000 mL | Freq: Once | ORAL | Status: AC
  Administered 2018-06-04: 15 mL via ORAL
  Filled 2018-06-04: qty 30

## 2018-06-04 MED ORDER — SODIUM CHLORIDE 0.9 % IV SOLN
INTRAVENOUS | Status: DC
  Administered 2018-06-04 – 2018-06-07 (×6): via INTRAVENOUS

## 2018-06-04 MED ORDER — SODIUM CHLORIDE 0.9 % IV SOLN
250.0000 mL | INTRAVENOUS | Status: DC | PRN
  Administered 2018-06-11: 16:00:00 via INTRAVENOUS

## 2018-06-04 MED ORDER — GADOBUTROL 1 MMOL/ML IV SOLN
7.5000 mL | Freq: Once | INTRAVENOUS | Status: AC | PRN
  Administered 2018-06-04: 7.5 mL via INTRAVENOUS

## 2018-06-04 MED ORDER — SODIUM CHLORIDE (PF) 0.9 % IJ SOLN
INTRAMUSCULAR | Status: AC
  Filled 2018-06-04: qty 50

## 2018-06-04 MED ORDER — DOCUSATE SODIUM 100 MG PO CAPS
100.0000 mg | ORAL_CAPSULE | Freq: Two times a day (BID) | ORAL | Status: DC | PRN
  Administered 2018-06-04 – 2018-06-05 (×2): 100 mg via ORAL
  Filled 2018-06-04 (×2): qty 1

## 2018-06-04 MED ORDER — MAGNESIUM CITRATE PO SOLN: 1.0000 | Freq: Once | ORAL | Status: DC | PRN

## 2018-06-04 MED ORDER — ACETAMINOPHEN 325 MG PO TABS
650.0000 mg | ORAL_TABLET | Freq: Four times a day (QID) | ORAL | Status: DC | PRN
  Filled 2018-06-04: qty 2

## 2018-06-04 MED ORDER — METHOTREXATE 2.5 MG PO TABS
20.0000 mg | ORAL_TABLET | ORAL | Status: DC
  Filled 2018-06-04: qty 8

## 2018-06-04 MED ORDER — LYSINE 500 MG PO CAPS: 500.0000 mg | ORAL_CAPSULE | Freq: Every day | ORAL | Status: DC | PRN

## 2018-06-04 MED ORDER — HYPROMELLOSE (GONIOSCOPIC) 2.5 % OP SOLN
1.0000 [drp] | OPHTHALMIC | Status: DC | PRN
  Filled 2018-06-04: qty 15

## 2018-06-04 MED ORDER — SODIUM CHLORIDE 0.9% FLUSH
3.0000 mL | Freq: Two times a day (BID) | INTRAVENOUS | Status: DC
  Administered 2018-06-04 – 2018-06-11 (×9): 3 mL via INTRAVENOUS

## 2018-06-04 MED ORDER — ONDANSETRON HCL 4 MG PO TABS: 4.0000 mg | ORAL_TABLET | Freq: Four times a day (QID) | ORAL | Status: DC | PRN

## 2018-06-04 MED ORDER — IOHEXOL 300 MG/ML  SOLN
100.0000 mL | Freq: Once | INTRAMUSCULAR | Status: AC | PRN
  Administered 2018-06-04: 100 mL via INTRAVENOUS

## 2018-06-04 MED ORDER — OXYCODONE HCL 5 MG PO TABS: 5.0000 mg | ORAL_TABLET | Freq: Four times a day (QID) | ORAL | Status: DC | PRN

## 2018-06-04 MED ORDER — ASPIRIN EC 81 MG PO TBEC: 81.0000 mg | DELAYED_RELEASE_TABLET | Freq: Every day | ORAL | Status: DC

## 2018-06-04 MED ORDER — BISACODYL 10 MG RE SUPP
10.0000 mg | Freq: Every day | RECTAL | Status: DC | PRN
  Administered 2018-06-05: 10 mg via RECTAL
  Filled 2018-06-04: qty 1

## 2018-06-04 MED ORDER — DOCUSATE SODIUM 100 MG PO CAPS: 100.0000 mg | ORAL_CAPSULE | Freq: Two times a day (BID) | ORAL | Status: DC

## 2018-06-04 MED ORDER — HYDROMORPHONE HCL 2 MG PO TABS: 4.0000 mg | ORAL_TABLET | Freq: Four times a day (QID) | ORAL | Status: DC | PRN

## 2018-06-04 NOTE — H&P (Addendum)
Brian [name] is an 72 y.o. male.   Chief Complaint: low back pain HPI: [name] presented to the office with the chief complaint of low back pain in addition to radicular pain into the right buttocks and leg. He also reported pain in his ribs as well as his left shoulder. He has a lot of numbness in his right lower leg as well. The sciatica has been bothering him for about 6 weeks. He has been using a walker to get around. Left shoulder has been bothering him for about 5 weeks. No recent falls or injures. He thinks the shoulder is bothering him from walking using a walker. No change in bladder or bowel function. No history of cancer. CT myelogram was ordered and showed at L4, there is a pathologic compression fracture with lytic destruction. There is loss of height posteriorly of 40%. Extraosseous tumor encroaches upon the spinal canal and both of the L4-5 intervertebral foramina, right more than left. Extraosseous tumor extends from the superior endplate of L4 down as far as the L4-5 disc. This is consistent with either metastatic carcinoma or myeloma. Patient was seen in the office and the decision was made for direct admission to the hospital for a full workup and consult to IR and oncology.   Family History Heart disease -Father -Mother Osteorthritis -Mother Diabetes mellitus -Mother   Social History Tobacco smoking status: Current every day smoker  Tobacco-years of use: 39 Occupation: retired Chewing tobacco: none Alcohol intake: None Hand dominance: Right Work related injury: No Advance directive: No Medical power of attorney: No Live alone or with others: with others (wife) Marital status: Married   Past Medical History Osteoarthritis Chronic Back Pain Concussion Eye Disease/Cataracts/Glaucoma Osteoarthritis Rheumatoid Arthritis COPD  Allergies:  Allergies  Allergen Reactions  . Minoxidil Palpitations  . Nicotine Palpitations   Medications aspirin 81 mg  capsule calcium cyclobenzaprine 10 mg tablet fluticasone propionate folic acid HYDROmorphone 4 mg tablet Take 1 tablet every 6-8 hours by oral route after meals. ibuprofen lysine 500 mg tablet metHOTREXate sodium 2.5 mg tablet omega 3 350 mg-dha 235 mg-epa 90 mg-fish oil 597 mg capsule,delay rel potassium predniSONE 5 mg tablet Pseudoephedrine Hcl S.R. 120 mg capsule,extended release    Ct Lumbar Spine W Contrast Result Date: 06/02/2018 CLINICAL DATA:  Low back and bilateral leg pain. EXAM: LUMBAR MYELOGRAM FLUOROSCOPY TIME:  0 minutes 38 seconds. 142.58 micro gray meter squared PROCEDURE: After thorough discussion of risks and benefits of the procedure including bleeding, infection, injury to nerves, blood vessels, adjacent structures as well as headache and CSF leak, written and oral informed consent was obtained. Consent was obtained by Dr. [name]. Time out form was completed. Patient was positioned prone on the fluoroscopy table. Local anesthesia was provided with 1% lidocaine without epinephrine after prepped and draped in the usual sterile fashion. Puncture was performed at L4-5 using a 3 1/2 inch 22-gauge spinal needle via right paramedian approach. Using a single pass through the dura, the needle was placed within the thecal sac, with return of clear CSF. 15 mL of Isovue M-200 was injected into the thecal sac, with normal opacification of the nerve roots and cauda equina consistent with free flow within the subarachnoid space. I personally performed the lumbar puncture and administered the intrathecal contrast. I also personally performed acquisition of the myelogram images. TECHNIQUE: Contiguous axial images were obtained through the Lumbar spine after the intrathecal infusion of infusion. Coronal and sagittal reconstructions were obtained of the axial image  sets. COMPARISON:  Radiography 04/13/2018 FINDINGS: LUMBAR MYELOGRAM FINDINGS: There is no compressive central canal stenosis.  There is an anterior extradural defect at the L4 level with an abnormal appearance of the posterior L4 vertebral body. There appears to be lytic change of the bone, new since the radiography of October. Extradural defect is present with some canal narrowing and lateral recess encroachment right more than left. L5-S1 shows disc space narrowing. Standing flexion extension views show perhaps lighted crease in prominence of the extradural defect at L4. No antero or retrolisthesis is seen. CT LUMBAR MYELOGRAM FINDINGS: No significant abnormality is seen at L2-3 or above. The vertebral bodies appear normal. The disc spaces are unremarkable. No compressive stenosis. At L4, there is a pathologic compression fracture with lytic destruction. There is loss of height posteriorly of 40%. Extraosseous tumor encroaches upon the spinal canal and both of the L4-5 intervertebral foramina, right more than left. Extraosseous tumor extends from the superior endplate of L4 down as far as the L4-5 disc. This is consistent with either metastatic carcinoma or myeloma. At L5-S1, there is chronic disc degeneration with vacuum phenomenon and bulging of the disc. There is bilateral facet degeneration. No central canal stenosis. There is bilateral foraminal narrowing right more than left. As shown at radiography, there is an infrarenal abdominal aortic aneurysm with transverse diameter maximal 3.8 cm. There are calcified gallstones within the gallbladder. No other regional finding. IMPRESSION: Pathologic fracture at L4 with maximal loss of height posteriorly of 40%. Lytic destruction of the L4 vertebral body. Extraosseous tumor encroaching upon the spinal canal at the L4 level, worse on the right than the left, with some foraminal encroachment worse on the right than the left at L4-5. This could be metastatic carcinoma or myeloma. Chronic degenerative disc disease at L5-S1 but without significant stenosis. Infrarenal abdominal aortic aneurysm  with maximal transverse diameter of 3.8 cm. Recommend followup by ultrasound in 2 years. This recommendation follows ACR consensus guidelines: White Paper of the ACR Incidental Findings Committee II on Vascular Findings. J Am Coll Radiol 2013; 10:789-794. Chololithiasis. These results will be called to the ordering clinician or representative by the Radiologist Assistant, and communication documented in the PACS or zVision Dashboard. Electronically Signed   By: [name] M.D.   On: 06/02/2018 11:59   Dg Myelography Lumbar Inj Lumbosacral Result Date: 06/02/2018 CLINICAL DATA:  Low back and bilateral leg pain. EXAM: LUMBAR MYELOGRAM FLUOROSCOPY TIME:  0 minutes 38 seconds. 142.58 micro gray meter squared PROCEDURE: After thorough discussion of risks and benefits of the procedure including bleeding, infection, injury to nerves, blood vessels, adjacent structures as well as headache and CSF leak, written and oral informed consent was obtained. Consent was obtained by Dr. [name]. Time out form was completed. Patient was positioned prone on the fluoroscopy table. Local anesthesia was provided with 1% lidocaine without epinephrine after prepped and draped in the usual sterile fashion. Puncture was performed at L4-5 using a 3 1/2 inch 22-gauge spinal needle via right paramedian approach. Using a single pass through the dura, the needle was placed within the thecal sac, with return of clear CSF. 15 mL of Isovue M-200 was injected into the thecal sac, with normal opacification of the nerve roots and cauda equina consistent with free flow within the subarachnoid space. I personally performed the lumbar puncture and administered the intrathecal contrast. I also personally performed acquisition of the myelogram images. TECHNIQUE: Contiguous axial images were obtained through the Lumbar spine after the intrathecal  infusion of infusion. Coronal and sagittal reconstructions were obtained of the axial image sets.  COMPARISON:  Radiography 04/13/2018 FINDINGS: LUMBAR MYELOGRAM FINDINGS: There is no compressive central canal stenosis. There is an anterior extradural defect at the L4 level with an abnormal appearance of the posterior L4 vertebral body. There appears to be lytic change of the bone, new since the radiography of October. Extradural defect is present with some canal narrowing and lateral recess encroachment right more than left. L5-S1 shows disc space narrowing. Standing flexion extension views show perhaps lighted crease in prominence of the extradural defect at L4. No antero or retrolisthesis is seen. CT LUMBAR MYELOGRAM FINDINGS: No significant abnormality is seen at L2-3 or above. The vertebral bodies appear normal. The disc spaces are unremarkable. No compressive stenosis. At L4, there is a pathologic compression fracture with lytic destruction. There is loss of height posteriorly of 40%. Extraosseous tumor encroaches upon the spinal canal and both of the L4-5 intervertebral foramina, right more than left. Extraosseous tumor extends from the superior endplate of L4 down as far as the L4-5 disc. This is consistent with either metastatic carcinoma or myeloma. At L5-S1, there is chronic disc degeneration with vacuum phenomenon and bulging of the disc. There is bilateral facet degeneration. No central canal stenosis. There is bilateral foraminal narrowing right more than left. As shown at radiography, there is an infrarenal abdominal aortic aneurysm with transverse diameter maximal 3.8 cm. There are calcified gallstones within the gallbladder. No other regional finding. IMPRESSION: Pathologic fracture at L4 with maximal loss of height posteriorly of 40%. Lytic destruction of the L4 vertebral body. Extraosseous tumor encroaching upon the spinal canal at the L4 level, worse on the right than the left, with some foraminal encroachment worse on the right than the left at L4-5. This could be metastatic carcinoma or  myeloma. Chronic degenerative disc disease at L5-S1 but without significant stenosis. Infrarenal abdominal aortic aneurysm with maximal transverse diameter of 3.8 cm. Recommend followup by ultrasound in 2 years. This recommendation follows ACR consensus guidelines: White Paper of the ACR Incidental Findings Committee II on Vascular Findings. J Am Coll Radiol 2013; 10:789-794. Chololithiasis. These results will be called to the ordering clinician or representative by the Radiologist Assistant, and communication documented in the PACS or zVision Dashboard. Electronically Signed   By: [name] M.D.   On: 06/02/2018 11:59    Review of Systems  Constitutional: Positive for malaise/fatigue. Negative for chills, diaphoresis, fever and weight loss.  HENT: Negative.   Eyes: Negative.   Respiratory: Positive for shortness of breath. Negative for cough, hemoptysis, sputum production and wheezing.   Cardiovascular: Negative.   Gastrointestinal: Positive for constipation. Negative for abdominal pain, blood in stool, diarrhea, heartburn, melena, nausea and vomiting.  Genitourinary: Positive for frequency. Negative for dysuria, flank pain, hematuria and urgency.  Musculoskeletal: Positive for back pain, joint pain and myalgias. Negative for falls and neck pain.  Skin: Negative.   Neurological: Positive for sensory change and weakness. Negative for dizziness, tingling, tremors, speech change, focal weakness, seizures, loss of consciousness and headaches.  Endo/Heme/Allergies: Negative.   Psychiatric/Behavioral: Negative.     Today's Vitals   06/04/18 1321  BP: (!) 144/80  Pulse: (!) 109  Resp: 14  Temp: 97.9 F (36.6 C)  TempSrc: Oral  SpO2: 95%    Physical Exam  Constitutional: He is oriented to person, place, and time. He appears well-developed and well-nourished. No distress.  HENT:  Head: Normocephalic and atraumatic.  Right Ear:  External ear normal.  Left Ear: External ear normal.  Nose:  Nose normal.  Mouth/Throat: Oropharynx is clear and moist.  Eyes: Conjunctivae and EOM are normal.  Neck: Normal range of motion. Neck supple.  Cardiovascular: Normal rate, regular rhythm, normal heart sounds and intact distal pulses.  Respiratory: Effort normal and breath sounds normal. No respiratory distress. He has no wheezes.  GI: Soft. Bowel sounds are normal. He exhibits no distension. There is no abdominal tenderness.  Neurological: He is alert and oriented to person, place, and time.  Skin: No rash noted. He is not diaphoretic. No erythema.  Psychiatric: He has a normal mood and affect. His behavior is normal.  Presents in wheelchair. Nonambulatory. Exam of his left shoulder normal except pain over the proximal clavicle. There is no gross deformities. There are no masses or tumors about the shoulder. Normal shoulder function. He had no major tenderness on palpation over the chest wall nor did he have pain with deep respirations. On auscultating his lungs his lungs were clear. His heart normal sinus rhythm and no murmur.  Neurologically he has some weakness of his toe extensors on the right. Calf is fine is no phlebitis. His circulation is intact. He had a great deal difficulty standing. Radicular pain into the right LE with lumbar motion.     Assessment/Plan Metastatic lumbar tumor Dr. Gladstone Lighter reviewed the test results with the patient and his wife. He has a significant pathologic fracture of L4 with that tumor displacing the sac somewhat from anterior to posterior. He is not incontinent and has good control of his bowel fact. Colonoscopy done 3 years ago He denies any problem for any heart disease blood pressure issues. No history of seizures. No history of any prostate issues. He has been a smoker for 62 years. 30 years ago he smoked 3 packs a day. Now he smokes about a pack a day. No history of any tumors or kidney issues. He is literally disabled getting up walking around on his own so  Dr. Gladstone Lighter decided to admit him. Dr. Gladstone Lighter called the radiologist who recommended we do a total body bone scan in preparation for possible needle biopsy. Since he is a smoker, he needs a CT scan of his chest. This could be a primary issue in the chest that has metastasized to the vertebrae. Will have the oncologist see him and will decide where to go from there. Will get a complete evaluation for multiple myeloma.    H&P performed by Dr. Latanya Maudlin, MD Documented by Ardeen Jourdain, PA-C  [place], PA-C 06/04/2018, 9:01 AM

## 2018-06-04 NOTE — Progress Notes (Signed)
Pt back from MRI. Complaining of back pain and indigestion. Page on call twice for TUMS, no response. Pt is also requesting Ibuprofen. Refusing Oxy. Gave dilaudid at 9pm. Page on call for Ibuprofen. Still awaiting orders.

## 2018-06-04 NOTE — Progress Notes (Signed)
Pt transported to MRI 

## 2018-06-05 DIAGNOSIS — Z923 Personal history of irradiation: Secondary | ICD-10-CM

## 2018-06-05 DIAGNOSIS — D492 Neoplasm of unspecified behavior of bone, soft tissue, and skin: Secondary | ICD-10-CM

## 2018-06-05 DIAGNOSIS — M545 Low back pain: Secondary | ICD-10-CM

## 2018-06-05 DIAGNOSIS — F17210 Nicotine dependence, cigarettes, uncomplicated: Secondary | ICD-10-CM

## 2018-06-05 LAB — COMPREHENSIVE METABOLIC PANEL
ALT: 19 U/L (ref 0–44)
AST: 19 U/L (ref 15–41)
Albumin: 2.8 g/dL — ABNORMAL LOW (ref 3.5–5.0)
Alkaline Phosphatase: 81 U/L (ref 38–126)
Anion gap: 9 (ref 5–15)
BUN: 10 mg/dL (ref 8–23)
CO2: 27 mmol/L (ref 22–32)
Calcium: 8.7 mg/dL — ABNORMAL LOW (ref 8.9–10.3)
Chloride: 103 mmol/L (ref 98–111)
Creatinine, Ser: 0.77 mg/dL (ref 0.61–1.24)
GFR calc Af Amer: >60 mL/min (ref >60)
GFR calc non Af Amer: >60 mL/min (ref >60)
Glucose, Bld: 101 mg/dL — ABNORMAL HIGH (ref 70–99)
Potassium: 4.3 mmol/L (ref 3.5–5.1)
Sodium: 139 mmol/L (ref 135–145)
Total Bilirubin: 0.9 mg/dL (ref 0.3–1.2)
Total Protein: 5.9 g/dL — ABNORMAL LOW (ref 6.5–8.1)

## 2018-06-05 LAB — CBC
HCT: 35.4 % — ABNORMAL LOW (ref 39.0–52.0)
Hemoglobin: 11.6 g/dL — ABNORMAL LOW (ref 13.0–17.0)
MCH: 31.6 pg (ref 26.0–34.0)
MCHC: 32.8 g/dL (ref 30.0–36.0)
MCV: 96.5 fL (ref 80.0–100.0)
Platelets: 255 10*3/uL (ref 150–400)
RBC: 3.67 MIL/uL — ABNORMAL LOW (ref 4.22–5.81)
RDW: 14.7 % (ref 11.5–15.5)
WBC: 10.7 10*3/uL — ABNORMAL HIGH (ref 4.0–10.5)
nRBC: 0 % (ref 0.0–0.2)

## 2018-06-05 MED ORDER — DOCUSATE SODIUM 100 MG PO CAPS
100.0000 mg | ORAL_CAPSULE | Freq: Two times a day (BID) | ORAL | Status: DC | PRN
  Administered 2018-06-06 – 2018-06-07 (×2): 100 mg via ORAL
  Filled 2018-06-05 (×2): qty 1

## 2018-06-05 MED ORDER — CALCIUM CARBONATE ANTACID 500 MG PO CHEW
1.0000 | CHEWABLE_TABLET | Freq: Once | ORAL | Status: AC
  Administered 2018-06-05: 200 mg via ORAL
  Filled 2018-06-05: qty 1

## 2018-06-05 MED ORDER — IBUPROFEN 200 MG PO TABS
400.0000 mg | ORAL_TABLET | Freq: Once | ORAL | Status: AC
  Administered 2018-06-05: 400 mg via ORAL
  Filled 2018-06-05: qty 2

## 2018-06-05 MED ORDER — IBUPROFEN 800 MG PO TABS
800.0000 mg | ORAL_TABLET | Freq: Three times a day (TID) | ORAL | Status: DC | PRN
  Administered 2018-06-05 – 2018-06-08 (×5): 800 mg via ORAL
  Filled 2018-06-05 (×8): qty 1

## 2018-06-05 NOTE — Consult Note (Signed)
Referral MD  Reason for Referral: Likely metastatic bronchogenic carcinoma-severe lower back pain secondary to L4 involvement  No chief complaint on file. : I cannot walk because of pain  HPI: [name] is a very nice 72 year old white male.  He is originally from [place].  He did serve in the [employer]  He was in for a couple years.  I thanked him for his service.  He has been in [place] for about 23 years.  He has been in great health.  He has been active.  He began to develop progressive lower back discomfort.  He had severe hip issues.  He was not really able to walk because of pain.  There is no weakness in the legs.  He had no problems with bowel or bladder incontinence.  He is a smoker.  He probably has about a 70-80-pack-year history of tobacco use.  He saw Dr. Gladstone Lighter.  As always, Dr. Gladstone Lighter was very thorough in the evaluation.  A CT scan of the lumbar spine was done on 06/02/2018.  This showed a pathologic fracture of the L4 vertebral body.  There was extraosseous tumor encroaching upon the spinal canal.  This was worse on the right than on the left.  Everything else looked okay.  An MRI of the lumbar spine was then done.  This showed metastatic tumor at L4 vertebral body with associated pathologic fracture and posterior bulging of the vertebral body.  It was felt that this was causing severe spinal canal stenosis and severe right neural foraminal stenosis.  He had [name] admitted.  A CT scan of the chest abdomen pelvis were done.  Unfortunately, this did show a tumor in the right lower lobe measuring 7.3 x 8.1 x 5.1 cm.  There was a destructive lesion in the left clavicle.  There was a destructive lesion in the right eighth rib.  There was a 2.5 cm lesion in the liver.  [name] has had no cough.  His appetite is okay.  He has had no nausea or vomiting.  He has had no bleeding.  There is been no headache.  We were asked to see him for management  recommendations.  Overall, his performance status prior to his hospitalization was ECOG 0     Past Medical History:  Diagnosis Date  . Arthritis   :  Past Surgical History:  Procedure Laterality Date  . CATARACT EXTRACTION, BILATERAL    :   Current Facility-Administered Medications:  .  0.9 %  sodium chloride infusion, , Intravenous, Continuous, Constable, Amber, PA-C, Stopped at 06/04/18 2040 .  0.9 %  sodium chloride infusion, 250 mL, Intravenous, PRN, Cecilio Asper, Amber, PA-C .  acetaminophen (TYLENOL) tablet 650 mg, 650 mg, Oral, Q6H PRN, Cecilio Asper, Amber, PA-C .  aspirin chewable tablet 81 mg, 81 mg, Oral, Daily, Constable, Amber, PA-C, 81 mg at 06/04/18 1423 .  bisacodyl (DULCOLAX) suppository 10 mg, 10 mg, Rectal, Daily PRN, Cecilio Asper, Amber, PA-C .  docusate sodium (COLACE) capsule 100 mg, 100 mg, Oral, BID PRN, Cecilio Asper, Amber, PA-C, 100 mg at 06/04/18 2032 .  folic acid (FOLVITE) tablet 1 mg, 1 mg, Oral, Daily, Constable, Amber, PA-C, 1 mg at 06/04/18 1423 .  HYDROmorphone (DILAUDID) tablet 2-4 mg, 2-4 mg, Oral, Q6H PRN, Cecilio Asper, Amber, PA-C, 4 mg at 06/04/18 2032 .  hydroxypropyl methylcellulose / hypromellose (ISOPTO TEARS / GONIOVISC) 2.5 % ophthalmic solution 1-2 drop, 1-2 drop, Both Eyes, PRN, Constable, Amber, PA-C .  ibuprofen (ADVIL,MOTRIN) tablet 800 mg, 800  mg, Oral, TID PRN, Latanya Maudlin, MD .  magnesium citrate solution 1 Bottle, 1 Bottle, Oral, Once PRN, Cecilio Asper, [employer], PA-C .  [START ON 06/09/2018] methotrexate (RHEUMATREX) tablet 20 mg, 20 mg, Oral, Weekly, Constable, Amber, PA-C .  ondansetron (ZOFRAN) tablet 4 mg, 4 mg, Oral, Q6H PRN **OR** ondansetron (ZOFRAN) injection 4 mg, 4 mg, Intravenous, Q6H PRN, Constable, Amber, PA-C, 4 mg at 06/04/18 1900 .  polyethylene glycol (MIRALAX / GLYCOLAX) packet 17 g, 17 g, Oral, Daily PRN, Constable, Amber, PA-C .  sodium chloride flush (NS) 0.9 % injection 3 mL, 3 mL, Intravenous, Q12H, Constable, Amber, PA-C, 3  mL at 06/04/18 1423 .  sodium chloride flush (NS) 0.9 % injection 3 mL, 3 mL, Intravenous, PRN, Constable, Amber, PA-C:  . aspirin  81 mg Oral Daily  . folic acid  1 mg Oral Daily  . [START ON 06/09/2018] methotrexate  20 mg Oral Weekly  . sodium chloride flush  3 mL Intravenous Q12H  :  Allergies  Allergen Reactions  . Minoxidil Palpitations  . Nicotine Palpitations  . Sulfamethoxazole Other (See Comments)    Both parents allergic, avoids med  :  History reviewed. No pertinent family history.:  Social History   Socioeconomic History  . Marital status: Married    Spouse name: Not on file  . Number of children: Not on file  . Years of education: Not on file  . Highest education level: Not on file  Occupational History  . Not on file  Social Needs  . Financial resource strain: Not on file  . Food insecurity:    Worry: Not on file    Inability: Not on file  . Transportation needs:    Medical: Not on file    Non-medical: Not on file  Tobacco Use  . Smoking status: Current Every Day Smoker    Packs/day: 0.25    Types: Cigarettes  . Smokeless tobacco: Never Used  Substance and Sexual Activity  . Alcohol use: Not on file  . Drug use: Never  . Sexual activity: Not on file  Lifestyle  . Physical activity:    Days per week: Not on file    Minutes per session: Not on file  . Stress: Not on file  Relationships  . Social connections:    Talks on phone: Not on file    Gets together: Not on file    Attends religious service: Not on file    Active member of club or organization: Not on file    Attends meetings of clubs or organizations: Not on file    Relationship status: Not on file  . Intimate partner violence:    Fear of current or ex partner: Not on file    Emotionally abused: Not on file    Physically abused: Not on file    Forced sexual activity: Not on file  Other Topics Concern  . Not on file  Social History Narrative  . Not on file  :  Review of Systems   Constitutional: Negative.   HENT: Negative.   Eyes: Negative.   Respiratory: Negative.   Cardiovascular: Negative.   Gastrointestinal: Negative.   Genitourinary: Negative.   Musculoskeletal: Positive for back pain.  Skin: Negative.   Neurological: Negative.   Endo/Heme/Allergies: Negative.   Psychiatric/Behavioral: Negative.      Exam: Well-developed well-nourished white male in no obvious distress.  Head neck exam shows no ocular or oral lesions.  He has no obvious adenopathy in the neck.  Lungs are clear bilaterally.  Cardiac exam regular rate and rhythm with no murmurs, rubs or bruits.  Abdomen is soft.  Has good bowel sounds.  There is no fluid wave.  There is no palpable liver or spleen tip.  Chest wall exam does show a subcutaneous nodule in the left anterior chest wall about the third-fourth rib.  It feels like a sebaceous cyst.  Back exam does show some tenderness in the lumbar spine.  Extremities shows no clubbing, cyanosis or edema.  He has decent strength in his upper and lower extremities.  Neurological exam shows no focal neurological deficits.  Skin exam shows no rashes, ecchymoses or petechia.  Patient Vitals for the past 24 hrs:  BP Temp Temp src Pulse Resp SpO2  06/05/18 0438 105/66 98.7 F (37.1 C) Oral 78 16 93 %  06/04/18 2005 133/82 98 F (36.7 C) Oral 96 20 93 %  06/04/18 1321 (!) 144/80 97.9 F (36.6 C) Oral (!) 109 14 95 %     Recent Labs    06/04/18 1417 06/05/18 0634  WBC 12.8* 10.7*  HGB 13.4 11.6*  HCT 40.6 35.4*  PLT 287 255   Recent Labs    06/04/18 1417 06/05/18 0634  NA 136 139  K 3.1* 4.3  CL 101 103  CO2 24 27  GLUCOSE 104* 101*  BUN 13 10  CREATININE 0.84 0.77  CALCIUM 9.2 8.7*    Blood smear review: None  Pathology: Pending    Assessment and Plan: [name] is a very nice 72 year old white male.  He looks like he has metastatic lung cancer.  I do believe that this is prerogative be a non-small cell lung cancer.  I  think the real issue though is what to do about his back.  I looked at his MRI.  He has replacement of L4 by tumor.  I really think that despite the fact that he has metastatic disease, that he is going to need to have some type of spinal procedure done to stabilize his spine.  I think if you treated his L4 tumor with radiation, he would be left with a unstable spine and that this would still cause a lot of pain and would really hinder his quality of life.  I realize that we are not a situation that is not curable.  He looks like he is in very good shape.  Prior to this back issue, he was very active.  He does not have a lot of metastatic disease.  With our new treatment, quite a few of our patients are living good lives for over a year and a half or more.  I really think that he is going to need to have a surgical procedure to remove the tumor at L4 and stabilize the spine.  Again, I realize that this is very aggressive for someone with metastatic disease.  However, I just think that he will still be debilitated after treatment because his spine will be unstable.  By doing surgery, we can get enough tumor for all of our molecular studies.  His spine can be stabilized and his pain will be markedly decreased.  I think, even with pain medicine, he still would be very limited.  He would be amenable to surgery if his pain would get better and he would be more functional.  I would do an MRI of the brain to see if there is any brain metastasis.  If he does have brain metastasis, then possibly I  would change my recommendations for doing it L4 corpectomy.  [name] served our country.  I wanted to make sure that we do as much as we can for him so that he will have a good quality of life while fighting this lung cancer.     Lattie Haw, MD  [name] 7:14

## 2018-06-05 NOTE — Progress Notes (Addendum)
Paged ortho. Received verbal order from Dr. Rolena Infante to give Tums and Iburopen per patient request. Patient states he states ibuprofen at home.

## 2018-06-05 NOTE — Progress Notes (Signed)
Subjective:    Patient reports pain as 5 on 0-10 scale. CT of abdomen and Chest shows a Right Lower Lobe Mass consistence with Bronchogenic Carcinoma.. He has metastatic lesions to his Left Clavicle,Right seventh Rib and the L-4 Vertebra. MRI of Lumbar spine reveals the same wit Spinal Stenosis. He is neurologically intact but very weak when he attempts to ambulate. He will be evaluated by DR. Enever ,Oncology.   Objective: Vital signs in last 24 hours: Temp:  [97.9 F (36.6 C)-98.7 F (37.1 C)] 98.7 F (37.1 C) (12/14 0438) Pulse Rate:  [78-109] 78 (12/14 0438) Resp:  [14-20] 16 (12/14 0438) BP: (105-144)/(66-82) 105/66 (12/14 0438) SpO2:  [93 %-95 %] 93 % (12/14 0438)  Intake/Output from previous day: 12/13 0701 - 12/14 0700 In: 120 [P.O.:120] Out: 1600 [Urine:1600] Intake/Output this shift: No intake/output data recorded.  Recent Labs    06/04/18 1417 06/05/18 0634  HGB 13.4 11.6*   Recent Labs    06/04/18 1417 06/05/18 0634  WBC 12.8* 10.7*  RBC 4.32 3.67*  HCT 40.6 35.4*  PLT 287 255   Recent Labs    06/04/18 1417  NA 136  K 3.1*  CL 101  CO2 24  BUN 13  CREATININE 0.84  GLUCOSE 104*  CALCIUM 9.2   Recent Labs    06/04/18 1417  INR 0.99    Neurologically intact  Very weak in Ambulation.  Assessment/Plan:    Up with therapy.Evaluation by Oncology and Total Body Bone Scan as requested by Radiologist    Latanya Maudlin 06/05/2018, 7:45 AM

## 2018-06-06 MED ORDER — OXYCODONE HCL ER 10 MG PO T12A
10.0000 mg | EXTENDED_RELEASE_TABLET | Freq: Two times a day (BID) | ORAL | Status: DC
  Administered 2018-06-07 (×2): 10 mg via ORAL
  Filled 2018-06-06 (×4): qty 1

## 2018-06-06 MED ORDER — ACETAMINOPHEN 325 MG PO TABS
650.0000 mg | ORAL_TABLET | Freq: Four times a day (QID) | ORAL | Status: DC | PRN
  Filled 2018-06-06: qty 2

## 2018-06-06 NOTE — Progress Notes (Signed)
Subjective:    Patient reports pain as 6 on 0-10 scale.Case thoroughly discussed with the patient and his wife. I explained to them that  Dr.Brooks will be in to see him and discuss surgery. I told him that he has a Tumor in his Right Lung that most likely sread to his skeletal system.    Objective: Vital signs in last 24 hours: Temp:  [98.1 F (36.7 C)-98.7 F (37.1 C)] 98.2 F (36.8 C) (12/15 0500) Pulse Rate:  [66-76] 73 (12/15 0500) Resp:  [17-18] 18 (12/15 0500) BP: (99-122)/(60-77) 122/73 (12/15 0500) SpO2:  [94 %-96 %] 94 % (12/15 0500)  Intake/Output from previous day: 12/14 0701 - 12/15 0700 In: 200 [P.O.:200] Out: 600 [Urine:600] Intake/Output this shift: No intake/output data recorded.  Recent Labs    06/04/18 1417 06/05/18 0634  HGB 13.4 11.6*   Recent Labs    06/04/18 1417 06/05/18 0634  WBC 12.8* 10.7*  RBC 4.32 3.67*  HCT 40.6 35.4*  PLT 287 255   Recent Labs    06/04/18 1417 06/05/18 0634  NA 136 139  K 3.1* 4.3  CL 101 103  CO2 24 27  BUN 13 10  CREATININE 0.84 0.77  GLUCOSE 104* 101*  CALCIUM 9.2 8.7*   Recent Labs    06/04/18 1417  INR 0.99    Main issue is Back and Leg pain.    Assessment/Plan:    Up with therapy he will be evaluated by DR. Brooks.    Latanya Maudlin 1June 28, 202019, 8:12 AM

## 2018-06-06 NOTE — Consult Note (Signed)
Brian Rocker, MD Chief Complaint: Metastatic L4 cancer with L4 fracture. History: Brian [name] is a very pleasant 72 year old gentleman who is been having progressive debilitating back pain which radiates into the lower extremity for the last 2-1/2 months.  There was no injury trauma or specific event that he can recall that led to the onset of his back pain.  The patient initially sought treatment by his rheumatologist who ultimately referred him to my partner Dr. Gladstone Lighter.  During the course of his work-up and treatment he had a CT myelogram which demonstrated an L4 pathological fracture.  As a result he is asked me to evaluate and take over care for the management of his spine.  Hospital course: Patient was admitted Friday for pain medical management as well as a comprehensive oncology work-up.  Thus far he has had 24-hour urine collection, CT scans of the chest, abdomen, pelvis as well as a lumbar MRI.  Three-phase bone scan (skeletal survey) is pending.  Patient reports that he is been essentially bed bound because there is too much pain when he tries to stand and ambulate.  He denies any loss in bowel and bladder control.  Past Medical History:  Diagnosis Date  . Arthritis     Allergies  Allergen Reactions  . Minoxidil Palpitations  . Nicotine Palpitations  . Sulfamethoxazole Other (See Comments)    Both parents allergic, avoids med    No current facility-administered medications on file prior to encounter.    Current Outpatient Medications on File Prior to Encounter  Medication Sig Dispense Refill  . aspirin 81 MG chewable tablet Chew 81 mg by mouth daily.    . Calcium Carb-Cholecalciferol (CALCIUM 600/VITAMIN D3) 600-800 MG-UNIT TABS Take 1 tablet by mouth daily.    Marland Kitchen docusate sodium (COLACE) 100 MG capsule Take 100 mg by mouth 2 (two) times daily as needed for mild constipation.    . folic acid (FOLVITE) 1 MG tablet Take 1 mg by mouth daily.    . Homeopathic Products (T-RELIEF PAIN  RELIEF) CREA Apply 1 application topically 2 (two) times daily. Hip/Back pain    . HYDROmorphone (DILAUDID) 4 MG tablet Take 4 mg by mouth every 6 (six) hours as needed for severe pain.    . hydroxypropyl methylcellulose / hypromellose (ISOPTO TEARS / GONIOVISC) 2.5 % ophthalmic solution Place 1-2 drops into both eyes as needed for dry eyes.    Marland Kitchen ibuprofen (ADVIL,MOTRIN) 200 MG tablet Take 400 mg by mouth every 6 (six) hours as needed for headache or mild pain.    Marland Kitchen Lysine 500 MG CAPS Take 500 mg by mouth daily as needed (chapped lips).    . methotrexate 2.5 MG tablet Take 20 mg by mouth once a week. Wednesdays    . Omega-3 350 MG CPDR Take 350 mg by mouth daily.    . Potassium 99 MG TABS Take 99 mg by mouth daily as needed (leg cramps).    . pseudoephedrine (SUDAFED) 120 MG 12 hr tablet Take 120 mg by mouth every 12 (twelve) hours as needed for congestion.      Physical Exam: Vitals:   06/05/18 2040 06/06/18 0500  BP: 118/77 122/73  Pulse: 76 73  Resp: 18 18  Temp: 98.7 F (37.1 C) 98.2 F (36.8 C)  SpO2: 96% 94%   There is no height or weight on file to calculate BMI. He is alert and oriented x3.  But he has noticed some issues with his short memory.  He states that he  is having increased difficulty remembering things.  Lungs: Clear to auscultation Cardiac: Regular rate and rhythm no rubs gallops or murmurs Abdomen: No significant tenderness with deep palpation.  No rebound tenderness.  No loss in bowel and bladder control. Neuro: 5 out of 5 motor strength in the lower extremity.  Motor strength testing was done when he was supine in the bed.  Negative lower extremity nerve root tension signs.  Negative Babinski test, no clonus, 1+ deep tendon reflexes at the knee and the Achilles.  Musculoskeletal: he is complaining of significant left clavicular/AC joint pain, right flank chest pain, and severe lumbar pain.  Patient has significant pain with light palpation over these areas.  Upper  extremity: He has actually full range of motion at the shoulder, elbow, wrist without significant pain with range of motion testing.  Lower extremity: No significant pain with range of motion of the hip, knee, ankle.  Lumbar spine: Significant pain with light palpation over the entire lumbar spine area.  No ecchymosis or bruising is noted.  No significant SI joint pain is noted.   Image: Ct Chest W Contrast  Result Date: 06/04/2018 CLINICAL DATA:  CLINICAL DATA Patient with a pathologic fracture in L4 on postmyelogram CT scan 06/02/2018. Evaluate for primary and metastatic disease. EXAM: CT CHEST, ABDOMEN, AND PELVIS WITH CONTRAST TECHNIQUE: Multidetector CT imaging of the chest, abdomen and pelvis was performed following the standard protocol during bolus administration of intravenous contrast. CONTRAST:  100 mL OMNIPAQUE IOHEXOL 300 MG/ML  SOLN COMPARISON:  Postmyelogram lumbar spine CT scan 06/02/2018. FINDINGS: CT CHEST FINDINGS Cardiovascular: Heart size is normal. There is calcific aortic and coronary atherosclerosis. No pericardial effusion. Mediastinum/Nodes: Right hilar lymph node on image 37 measures 1.5 cm short axis dimension. Small node anterior to the right mainstem bronchus measuring 1 cm on image 33 also noted. A few smaller mediastinal lymph nodes are noted. No axillary or supraclavicular lymphadenopathy. Lungs/Pleura: The lungs demonstrate extensive centrilobular emphysematous disease. There is a mass in the right lower lobe which measures 7.3 cm craniocaudal on image 72 of series 7 by 8.1 cm transverse by 5.1 cm AP on image 108 of series 4. Subpleural nodule in the right middle lobe on image 120 measures 0.5 cm Musculoskeletal: A destructive lesion in the medial diaphysis of the left clavicle measures 3 cm long image 20 of series 4 and is approximately 1.4 cm medial to the clavicular head. A destructive lesion in the lateral arc of the right eighth rib measures 7 cm long by 3.1 cm  transverse by 4.2 cm craniocaudal. Subtle lucent lesion lateral arc of the left seventh rib on image 105 of series 4 is also noted. No other focal bony abnormality is identified. A subcutaneous low attenuating lesion in the anterior left chest wall measures 1.8 cm AP x 2.6 cm transverse x 2.5 cm craniocaudal. CT ABDOMEN PELVIS FINDINGS Hepatobiliary: A 2.5 cm in diameter hypoattenuating lesion is seen in the left hepatic lobe near the dome on image 54. A punctate hypoattenuating lesion in the right hepatic lobe on image 70 is also identified. The liver is otherwise unremarkable. A few stones are seen in the gallbladder but there is no evidence of cholecystitis. Biliary tree appears normal. Pancreas: Unremarkable. No pancreatic ductal dilatation or surrounding inflammatory changes. Spleen: Normal in size without focal abnormality. Adrenals/Urinary Tract: Mild thickening of the left adrenal gland is most suggestive of hyperplasia. The right adrenal gland is unremarkable. Stomach/Bowel: Stomach is within normal limits. No evidence of  appendicitis. No evidence of bowel wall thickening, distention, or inflammatory changes. Vascular/Lymphatic: The patient has extensive calcific atherosclerosis. Mild aneurysmal dilatation of the descending abdominal aorta at 3.3 cm is identified. No lymphadenopathy. Reproductive: The prostate gland is mildly enlarged. Other: Small fat containing left inguinal hernia is noted. Musculoskeletal: Pathologic fracture of L4 is identified as seen on the comparison CT. No other bony abnormality is identified. Simple lipoma in the right gluteal musculature incidentally noted. IMPRESSION: Right lower lobe mass lesion most consistent with bronchogenic carcinoma. Destructive lesions in the left clavicle, right seventh ribs and L4 vertebral body are consistent with metastatic disease. Small mediastinal lymph nodes measuring up to 1.5 cm are identified may also be pathologic. Two lesions in the liver  are identified and worrisome for metastatic disease. Advanced emphysema. Calcific aortic and coronary atherosclerosis. 3.3 cm abdominal aortic aneurysm is identified. Recommend followup by ultrasound in 3 years. This recommendation follows ACR consensus guidelines: White Paper of the ACR Incidental Findings Committee II on Vascular Findings. J Am Coll Radiol 2013; 10:789-794 Gallstones without evidence of cholecystitis. Cystic lesion in the subcutaneous tissues of the anterior left chest wall is likely a sebaceous cyst. Simple lipoma right gluteal musculature. Electronically Signed   By: Inge Rise M.D.   On: 06/04/2018 19:31   Ct Lumbar Spine W Contrast  Result Date: 06/02/2018 CLINICAL DATA:  Low back and bilateral leg pain. EXAM: LUMBAR MYELOGRAM FLUOROSCOPY TIME:  0 minutes 38 seconds. 142.58 micro gray meter squared PROCEDURE: After thorough discussion of risks and benefits of the procedure including bleeding, infection, injury to nerves, blood vessels, adjacent structures as well as headache and CSF leak, written and oral informed consent was obtained. Consent was obtained by Dr. [name]. Time out form was completed. Patient was positioned prone on the fluoroscopy table. Local anesthesia was provided with 1% lidocaine without epinephrine after prepped and draped in the usual sterile fashion. Puncture was performed at L4-5 using a 3 1/2 inch 22-gauge spinal needle via right paramedian approach. Using a single pass through the dura, the needle was placed within the thecal sac, with return of clear CSF. 15 mL of Isovue M-200 was injected into the thecal sac, with normal opacification of the nerve roots and cauda equina consistent with free flow within the subarachnoid space. I personally performed the lumbar puncture and administered the intrathecal contrast. I also personally performed acquisition of the myelogram images. TECHNIQUE: Contiguous axial images were obtained through the Lumbar spine  after the intrathecal infusion of infusion. Coronal and sagittal reconstructions were obtained of the axial image sets. COMPARISON:  Radiography 04/13/2018 FINDINGS: LUMBAR MYELOGRAM FINDINGS: There is no compressive central canal stenosis. There is an anterior extradural defect at the L4 level with an abnormal appearance of the posterior L4 vertebral body. There appears to be lytic change of the bone, new since the radiography of October. Extradural defect is present with some canal narrowing and lateral recess encroachment right more than left. L5-S1 shows disc space narrowing. Standing flexion extension views show perhaps lighted crease in prominence of the extradural defect at L4. No antero or retrolisthesis is seen. CT LUMBAR MYELOGRAM FINDINGS: No significant abnormality is seen at L2-3 or above. The vertebral bodies appear normal. The disc spaces are unremarkable. No compressive stenosis. At L4, there is a pathologic compression fracture with lytic destruction. There is loss of height posteriorly of 40%. Extraosseous tumor encroaches upon the spinal canal and both of the L4-5 intervertebral foramina, right more than left. Extraosseous tumor  extends from the superior endplate of L4 down as far as the L4-5 disc. This is consistent with either metastatic carcinoma or myeloma. At L5-S1, there is chronic disc degeneration with vacuum phenomenon and bulging of the disc. There is bilateral facet degeneration. No central canal stenosis. There is bilateral foraminal narrowing right more than left. As shown at radiography, there is an infrarenal abdominal aortic aneurysm with transverse diameter maximal 3.8 cm. There are calcified gallstones within the gallbladder. No other regional finding. IMPRESSION: Pathologic fracture at L4 with maximal loss of height posteriorly of 40%. Lytic destruction of the L4 vertebral body. Extraosseous tumor encroaching upon the spinal canal at the L4 level, worse on the right than the  left, with some foraminal encroachment worse on the right than the left at L4-5. This could be metastatic carcinoma or myeloma. Chronic degenerative disc disease at L5-S1 but without significant stenosis. Infrarenal abdominal aortic aneurysm with maximal transverse diameter of 3.8 cm. Recommend followup by ultrasound in 2 years. This recommendation follows ACR consensus guidelines: White Paper of the ACR Incidental Findings Committee II on Vascular Findings. J Am Coll Radiol 2013; 10:789-794. Chololithiasis. These results will be called to the ordering clinician or representative by the Radiologist Assistant, and communication documented in the PACS or zVision Dashboard. Electronically Signed   By: [name] M.D.   On: 06/02/2018 11:59   Mr Lumbar Spine W Wo Contrast  Result Date: 06/04/2018 CLINICAL DATA:  Metastatic disease.  Lung cancer. EXAM: MRI LUMBAR SPINE WITHOUT AND WITH CONTRAST TECHNIQUE: Multiplanar and multiecho pulse sequences of the lumbar spine were obtained without and with intravenous contrast. CONTRAST:  7.5 mL Gadavist COMPARISON:  CT lumbar spine 06/02/2018 FINDINGS: Segmentation: Normal. The lowest disc space is considered to be L5-S1. Alignment:  Normal Vertebrae: There is loss of normal signal throughout the L4 vertebral body, where there is a known pathologic fracture with approximately 40% height loss. There is posterior convex bulging with multifocal contrast-enhancement. There is thickening of the overlying ventral dura with associated increased contrast enhancement. This results in severe spinal canal stenosis at this level. There are no other focal osseous lesions. Conus medullaris and cauda equina: The conus medullaris terminates at the L1 level. The cauda equina and conus medullaris are both normal. Paraspinal and other soft tissues: The visualized aorta, IVC and iliac vessels are normal. The visualized retroperitoneal organs and paraspinal soft tissues are normal. Disc  levels: Sagittal plane imaging includes the T11-12 disc level through the upper sacrum, with axial imaging of the T12-L1 to L5-S1 disc levels. There is disc desiccation at the T12-L1, L1-L2 and L2-L3 levels without spinal canal stenosis or neural impingement. L3-4: Small disc bulge without associated stenosis. At the L4 level, there is severe spinal canal stenosis due to posterior bulging of the L4 vertebral body. L4-5: Minimal disc bulge. There is severe right neural foraminal stenosis due to the L4 mass. Mild left foraminal stenosis. L5-S1: Disc desiccation and mild endplate spurring. Moderate right neural foraminal stenosis. The visualized portion of the sacrum is normal. IMPRESSION: 1. Metastatic lesion of the L4 vertebral body with associated pathologic fracture and posterior bulging of the vertebral body. This, in combination with overlying epidural thickening, causes severe spinal canal stenosis and severe right neural foraminal stenosis. 2. Moderate right L5-S1 neural foraminal stenosis. Electronically Signed   By: Ulyses Jarred M.D.   On: 06/04/2018 22:20   Ct Abdomen Pelvis W Contrast  Result Date: 06/04/2018 CLINICAL DATA:  CLINICAL DATA Patient with a  pathologic fracture in L4 on postmyelogram CT scan 06/02/2018. Evaluate for primary and metastatic disease. EXAM: CT CHEST, ABDOMEN, AND PELVIS WITH CONTRAST TECHNIQUE: Multidetector CT imaging of the chest, abdomen and pelvis was performed following the standard protocol during bolus administration of intravenous contrast. CONTRAST:  100 mL OMNIPAQUE IOHEXOL 300 MG/ML  SOLN COMPARISON:  Postmyelogram lumbar spine CT scan 06/02/2018. FINDINGS: CT CHEST FINDINGS Cardiovascular: Heart size is normal. There is calcific aortic and coronary atherosclerosis. No pericardial effusion. Mediastinum/Nodes: Right hilar lymph node on image 37 measures 1.5 cm short axis dimension. Small node anterior to the right mainstem bronchus measuring 1 cm on image 33 also  noted. A few smaller mediastinal lymph nodes are noted. No axillary or supraclavicular lymphadenopathy. Lungs/Pleura: The lungs demonstrate extensive centrilobular emphysematous disease. There is a mass in the right lower lobe which measures 7.3 cm craniocaudal on image 72 of series 7 by 8.1 cm transverse by 5.1 cm AP on image 108 of series 4. Subpleural nodule in the right middle lobe on image 120 measures 0.5 cm Musculoskeletal: A destructive lesion in the medial diaphysis of the left clavicle measures 3 cm long image 20 of series 4 and is approximately 1.4 cm medial to the clavicular head. A destructive lesion in the lateral arc of the right eighth rib measures 7 cm long by 3.1 cm transverse by 4.2 cm craniocaudal. Subtle lucent lesion lateral arc of the left seventh rib on image 105 of series 4 is also noted. No other focal bony abnormality is identified. A subcutaneous low attenuating lesion in the anterior left chest wall measures 1.8 cm AP x 2.6 cm transverse x 2.5 cm craniocaudal. CT ABDOMEN PELVIS FINDINGS Hepatobiliary: A 2.5 cm in diameter hypoattenuating lesion is seen in the left hepatic lobe near the dome on image 54. A punctate hypoattenuating lesion in the right hepatic lobe on image 70 is also identified. The liver is otherwise unremarkable. A few stones are seen in the gallbladder but there is no evidence of cholecystitis. Biliary tree appears normal. Pancreas: Unremarkable. No pancreatic ductal dilatation or surrounding inflammatory changes. Spleen: Normal in size without focal abnormality. Adrenals/Urinary Tract: Mild thickening of the left adrenal gland is most suggestive of hyperplasia. The right adrenal gland is unremarkable. Stomach/Bowel: Stomach is within normal limits. No evidence of appendicitis. No evidence of bowel wall thickening, distention, or inflammatory changes. Vascular/Lymphatic: The patient has extensive calcific atherosclerosis. Mild aneurysmal dilatation of the descending  abdominal aorta at 3.3 cm is identified. No lymphadenopathy. Reproductive: The prostate gland is mildly enlarged. Other: Small fat containing left inguinal hernia is noted. Musculoskeletal: Pathologic fracture of L4 is identified as seen on the comparison CT. No other bony abnormality is identified. Simple lipoma in the right gluteal musculature incidentally noted. IMPRESSION: Right lower lobe mass lesion most consistent with bronchogenic carcinoma. Destructive lesions in the left clavicle, right seventh ribs and L4 vertebral body are consistent with metastatic disease. Small mediastinal lymph nodes measuring up to 1.5 cm are identified may also be pathologic. Two lesions in the liver are identified and worrisome for metastatic disease. Advanced emphysema. Calcific aortic and coronary atherosclerosis. 3.3 cm abdominal aortic aneurysm is identified. Recommend followup by ultrasound in 3 years. This recommendation follows ACR consensus guidelines: White Paper of the ACR Incidental Findings Committee II on Vascular Findings. J Am Coll Radiol 2013; 10:789-794 Gallstones without evidence of cholecystitis. Cystic lesion in the subcutaneous tissues of the anterior left chest wall is likely a sebaceous cyst. Simple lipoma right gluteal  musculature. Electronically Signed   By: Inge Rise M.D.   On: 06/04/2018 19:31   Dg Myelography Lumbar Inj Lumbosacral  Result Date: 06/02/2018 CLINICAL DATA:  Low back and bilateral leg pain. EXAM: LUMBAR MYELOGRAM FLUOROSCOPY TIME:  0 minutes 38 seconds. 142.58 micro gray meter squared PROCEDURE: After thorough discussion of risks and benefits of the procedure including bleeding, infection, injury to nerves, blood vessels, adjacent structures as well as headache and CSF leak, written and oral informed consent was obtained. Consent was obtained by Dr. [name]. Time out form was completed. Patient was positioned prone on the fluoroscopy table. Local anesthesia was provided  with 1% lidocaine without epinephrine after prepped and draped in the usual sterile fashion. Puncture was performed at L4-5 using a 3 1/2 inch 22-gauge spinal needle via right paramedian approach. Using a single pass through the dura, the needle was placed within the thecal sac, with return of clear CSF. 15 mL of Isovue M-200 was injected into the thecal sac, with normal opacification of the nerve roots and cauda equina consistent with free flow within the subarachnoid space. I personally performed the lumbar puncture and administered the intrathecal contrast. I also personally performed acquisition of the myelogram images. TECHNIQUE: Contiguous axial images were obtained through the Lumbar spine after the intrathecal infusion of infusion. Coronal and sagittal reconstructions were obtained of the axial image sets. COMPARISON:  Radiography 04/13/2018 FINDINGS: LUMBAR MYELOGRAM FINDINGS: There is no compressive central canal stenosis. There is an anterior extradural defect at the L4 level with an abnormal appearance of the posterior L4 vertebral body. There appears to be lytic change of the bone, new since the radiography of October. Extradural defect is present with some canal narrowing and lateral recess encroachment right more than left. L5-S1 shows disc space narrowing. Standing flexion extension views show perhaps lighted crease in prominence of the extradural defect at L4. No antero or retrolisthesis is seen. CT LUMBAR MYELOGRAM FINDINGS: No significant abnormality is seen at L2-3 or above. The vertebral bodies appear normal. The disc spaces are unremarkable. No compressive stenosis. At L4, there is a pathologic compression fracture with lytic destruction. There is loss of height posteriorly of 40%. Extraosseous tumor encroaches upon the spinal canal and both of the L4-5 intervertebral foramina, right more than left. Extraosseous tumor extends from the superior endplate of L4 down as far as the L4-5 disc. This  is consistent with either metastatic carcinoma or myeloma. At L5-S1, there is chronic disc degeneration with vacuum phenomenon and bulging of the disc. There is bilateral facet degeneration. No central canal stenosis. There is bilateral foraminal narrowing right more than left. As shown at radiography, there is an infrarenal abdominal aortic aneurysm with transverse diameter maximal 3.8 cm. There are calcified gallstones within the gallbladder. No other regional finding. IMPRESSION: Pathologic fracture at L4 with maximal loss of height posteriorly of 40%. Lytic destruction of the L4 vertebral body. Extraosseous tumor encroaching upon the spinal canal at the L4 level, worse on the right than the left, with some foraminal encroachment worse on the right than the left at L4-5. This could be metastatic carcinoma or myeloma. Chronic degenerative disc disease at L5-S1 but without significant stenosis. Infrarenal abdominal aortic aneurysm with maximal transverse diameter of 3.8 cm. Recommend followup by ultrasound in 2 years. This recommendation follows ACR consensus guidelines: White Paper of the ACR Incidental Findings Committee II on Vascular Findings. J Am Coll Radiol 2013; 10:789-794. Chololithiasis. These results will be called to the ordering clinician  or representative by the Radiologist Assistant, and communication documented in the PACS or zVision Dashboard. Electronically Signed   By: [name] M.D.   On: 06/02/2018 11:59    A/P: Brian [name] is a very pleasant 72 year old gentleman with a 80-month history of progressive debilitating back pain.  Although he has no focal neurological deficits on clinical exam he does have horrific back pain which is significantly impacting the quality of his life.  Imaging studies confirm an L4 lytic lesion with L4 compression fracture deformity.  Patient has a pathological fracture at L4 with tumor extending posteriorly creating significant spinal stenosis centrally and in the  right neural foramen.  The remainder of his lumbar MRI and CT scan did not demonstrate any other areas of concern in the lumbar spine.  CT scans do indicate there is a mass in the lungs suggestive of bronchogenic carcinoma as the primary.  There is also a lesion in the right rib in the left clavicle.  I have reviewed the notes from [place] and I agree that the patient does require an L4 corpectomy.  In order to address the instability I would need to do an L4 corpectomy with L3-L5 fusion and then supplemental posterior fixation most likely L2-L5 possibly even through S1.  In addition you may also require supplemental posterior decompression at L4.  If the patient's life expectancy is greater than 6 months then I would recommend reconstruction with a cage and bone.  If however it is not, then I would recommend a cement/re-bar construct to reconstruct the L4 corpectomy.  I had a long discussion with the patient and his wife concerning the pathology, the instability pattern, and the surgical management of this.  All of their questions were addressed.  The risks of surgery include infection, bleeding, [place] Side to the bowel and bladder, loss of bowel and bladder control, blood clots, death, stroke, paralysis, injury to the lumbar plexus resulting in hip flexor weakness and difficulty walking, nonunion, hardware failure, need for additional surgery, leak of spinal fluid, ongoing or worse pain.  At this point time I am going to order a custom TLSO from [employer].  He will require brace treatment following surgery.  Once he is fitted with a brace and then a taken upright standing x-ray to evaluate his overall stability and determine the best surgical approach for him.  Further recommendations pending review of his bone scan as well as his upright x-rays in the TLSO brace.

## 2018-06-06 NOTE — Progress Notes (Signed)
Orthopedic Tech Progress Note Patient Details:  Brian [name] [birth year] [id number] [employer] was called and brace was ordered. Patient ID: Brian [name], male   DOB: 11-04-1945, 72 y.o.   MRN: [id number]   Ladell Pier [facility] 111-08-2017, 2:57 PM

## 2018-06-06 NOTE — Progress Notes (Signed)
So far, everything is pretty steady with [name].  He really is not getting out of bed that much.  Pain I think is the biggest problem that he has.  I think that his pain is much more significant than what he says to Dr. Gladstone Lighter.  He says that the pain does go down his legs when he walks.  Again, I really think that a L4 corpectomy is necessary.  He does not have a lot of metastatic disease.  He is in good physical shape.  I think that his quality of life is really good to be dictated by his back.  With surgery, there will be enough material for pathology so that we can send off all of our genetic studies that we need.  He probably is going to need some type of long-term pain management.  He says he has problems with patches.  As such, I will try him on OxyContin.  I think this is fairly effective and tolerated pretty well.  We will definitely need radiation therapy to the back once he heals up from surgery.  He may need radiation to the left clavicle also.  His appetite is pretty good.  His wife was in with him today.  I had a long talk with both of them.  I again emphasized that this is a situation that is not curable.  However, I think that we can have a significant impact on his quality of life and quantity of life with treatment.  Pathology is key.  Again, with surgery, we should have more than enough pathologic specimen for analysis.  [name] is ready for surgery.  He just wants to have the pain improved.  Again, I think that corpectomy is going to be the best way for [place] to achieve this.  I think that with radiation, the L4 vertebral body will be weakened so much that he will still be left with an unstable spine.  Hopefully, we will see him next over at [place], MD  1 Thessalonians 5:16-18

## 2018-06-07 ENCOUNTER — Inpatient Hospital Stay (HOSPITAL_COMMUNITY): Payer: Medicare Other

## 2018-06-07 ENCOUNTER — Other Ambulatory Visit: Payer: Self-pay

## 2018-06-07 DIAGNOSIS — C799 Secondary malignant neoplasm of unspecified site: Secondary | ICD-10-CM

## 2018-06-07 DIAGNOSIS — J40 Bronchitis, not specified as acute or chronic: Secondary | ICD-10-CM

## 2018-06-07 DIAGNOSIS — Z01818 Encounter for other preprocedural examination: Secondary | ICD-10-CM

## 2018-06-07 DIAGNOSIS — Z720 Tobacco use: Secondary | ICD-10-CM | POA: Diagnosis present

## 2018-06-07 LAB — PROTEIN ELECTROPHORESIS, SERUM
A/G Ratio: 1.1 (ref 0.7–1.7)
Albumin ELP: 3.2 g/dL (ref 2.9–4.4)
Alpha-1-Globulin: 0.4 g/dL (ref 0.0–0.4)
Alpha-2-Globulin: 1.2 g/dL — ABNORMAL HIGH (ref 0.4–1.0)
Beta Globulin: 0.9 g/dL (ref 0.7–1.3)
Gamma Globulin: 0.5 g/dL (ref 0.4–1.8)
Globulin, Total: 2.9 g/dL (ref 2.2–3.9)
Total Protein ELP: 6.1 g/dL (ref 6.0–8.5)

## 2018-06-07 LAB — ANTINUCLEAR ANTIBODIES, IFA: ANA Ab, IFA: NEGATIVE

## 2018-06-07 MED ORDER — ZOLEDRONIC ACID 4 MG/5ML IV CONC
4.0000 mg | Freq: Once | INTRAVENOUS | Status: AC
  Administered 2018-06-07: 4 mg via INTRAVENOUS
  Filled 2018-06-07: qty 5

## 2018-06-07 MED ORDER — DEXAMETHASONE SODIUM PHOSPHATE 10 MG/ML IJ SOLN
20.0000 mg | INTRAMUSCULAR | Status: AC
  Administered 2018-06-07 – 2018-06-10 (×4): 20 mg via INTRAVENOUS
  Filled 2018-06-07 (×3): qty 2
  Filled 2018-06-07: qty 5
  Filled 2018-06-07: qty 2

## 2018-06-07 MED ORDER — TECHNETIUM TC 99M MEDRONATE IV KIT
20.0000 | PACK | Freq: Once | INTRAVENOUS | Status: AC | PRN
  Administered 2018-06-07: 20.2 via INTRAVENOUS

## 2018-06-07 MED ORDER — GADOBUTROL 1 MMOL/ML IV SOLN
10.0000 mL | Freq: Once | INTRAVENOUS | Status: AC | PRN
  Administered 2018-06-07: 9 mL via INTRAVENOUS

## 2018-06-07 MED ORDER — DIAZEPAM 5 MG PO TABS
10.0000 mg | ORAL_TABLET | Freq: Once | ORAL | Status: AC
  Administered 2018-06-07: 10 mg via ORAL
  Filled 2018-06-07: qty 2

## 2018-06-07 NOTE — Care Management Important Message (Signed)
Important Message  Patient Details  Name: Brian [name] MRN: [id number] Date of Birth: 1945/09/11   Medicare Important Message Given:  Yes    Kerin Salen 06/07/2018, 2:16 [place] Message  Patient Details  Name: Brian [name] MRN: [id number] Date of Birth: [birth year]   Medicare Important Message Given:  Yes    Kerin Salen 06/07/2018, 2:16 PM

## 2018-06-07 NOTE — Progress Notes (Signed)
Patient continues to have significant back pain. Patient was fitted with a Aspen TLSO.  Should be noted that I had ordered a custom TLSO and not an off-the-shelf TLSO.  Patient did have the remainder of his preoperative imaging studies.  Bone scan:  Abnormal uptake is seen in midportion of right rib, L4 vertebral body and anterior portions of left first rib and left clavicle consistent with metastatic disease. This correlates with findings seen on prior CT scan.  No evidence of any other skeletal metastases are noted on the bone scan.    Brain MRI: No evidence of metastatic disease.  Moderate chronic small vessel ischemic changes affecting the brain.  Plan.  At this point time the patient has significant ongoing back pain secondary to the pathological fracture of L4 due to the metastatic lesion.  1.  Preoperative medical clearance has been obtained by the hospitalist team.   2.  Given the fact that he has no brain mets, and he has only the right seventh rib, and clavicle I agree with Dr. Marin Olp that his metastatic tumor load is low.  Given that I do think moving forward with the L4 corpectomy and reconstruction is in his best choice. 3.  Goals of surgery were discussed again with patient and his wife.  These include: Reduction in pain, improved ability to ambulate, and decreased tumor burden at the L4 level.  The patient and his wife know that this is not a curative procedure and that I cannot completely eliminate all of the tumor at that level.  The goal is to decompress the spinal cord and stabilize it in an attempt to decrease his pain and improve his quality of life.  Furthermore we will obtain enough tissue for appropriate tissue diagnosis for postoperative neoadjuvant chemotherapy/radiation treatment. 4.  We have also discussed the risks of surgery which include: Death, stroke, paralysis, hardware failure, need for additional surgery, leak of spinal fluid, ongoing or worse pain, loss of bowel  and bladder control, blood clots, infection, bleeding, nonunion, injury to the lumbar plexus resulting in weakness and further compromised ability to ambulate 5.  Patient has expressed an understanding of these risks and all their questions were addressed.  Surgical plan: L4 corpectomy with reconstruction from the lateral decubitus position.  Supplemental posterior instrumented fusion L2-L5 possibly to S1.  Possible posterior L4 decompression and further resection of tumor.  Surgery has been scheduled for Friday or noon.  Postoperatively more than likely we will keep the patient intubated and transferred to the ICU where appropriate pain management can be provided and he can be slowly weaned to extubation on Saturday.  My hope is that by Monday he will be ambulating and we can make a decision as to whether or not he would be an appropriate candidate for [facility] inpatient rehab versus discharge to home with home health services.  As it relates to the [employer].  I will contact [employer] so they can pick this up as this was not the brace that was ordered.  Postoperatively we will have him measured for an actual custom TLSO the brace that was specifically ordered by me yesterday.

## 2018-06-07 NOTE — Progress Notes (Signed)
Everything is about the same for [name].  He still is not really getting out of bed because of pain.  He was brought in a brace.  He certainly is not happy with this brace.  He says that the brace probably is not going to help him.  I am not sure exactly what this indicates.  I think the brace probably would work very well for him once he has his surgery and fusion.  I do not know why he is not yet had the MRI of the brain.  I think this is a very critical test for [place].  I think that if he does have brain metastasis, then I probably would not put him through major spinal surgery.  I thought for sure that the MRI would have been done yesterday.  I do not know if there is any problems with the MRI machine.  He is on OxyContin.  He is on 10 mg p.o. twice daily.  Hopefully this will help with some of the pain issues that he might be having.  His appetite continues to be quite good.  I will try him on some Xgeva or Zometa.  We will see how this does for his bones.  I could also consider some steroid use for him.  However, I do not want to give him too many steroids right now as this might affect some of the healing for surgery if this is to happen.  I am not sure when he is going to have surgery for the lumbar L4 involvement.  We will continue to follow the lead of orthopedic surgery.  I appreciate all the care that he is getting from all the staff on 5 E.  Lattie Haw, MD  Romans `12:16

## 2018-06-07 NOTE — Consult Note (Signed)
[facility] Medical Consultation  Kee Drudge NTZ:[id number] DOB: [birth year] DOA: 06/04/2018 PCP: [name], MD   Requesting physician: Dr. Gladstone Lighter Date of consultation: 06/07/18 Reason for consultation: medical evaluation preop   Impression/Recommendations Active Problems:   Lumbar spine tumor   Bronchitis due to tobacco use   Metastatic cancer [facility])  72 y/o male with PMH of RA, Tobacco use, is admitted under orthopedics service for evaluation of lower back pains, found to have metastatic cancer with bone mets/pathologic fracture at L4.  Patient is being scheduled for spinal surgery this week. hospitalist is asked for preop medical evaluation.   Metastatic cancer with pathologic L4 fracture. CT chest: Right lower lobe mass lesion most consistent with bronchogenic carcinoma, mets to R 7th rib, L4 vertebra, two liver lesions. Bone scan: right rib, L4 vertebra, L first rib, left clavicle. MRI brain: no mets. Pt is being scheduled for spinal surgery this week, bone biopsy. Received steroids, valium, opioids, zometa, cont per ortho, oncology. -Preop: patient has no history of ischemic heart disease, no exertional symptoms at baseline, no diabetes, no history of strokes, no renal disease. ECG: no acute ischemic changes. RCRI risk is 3.9%. He may have underlying copd, but no s/s of exacerbation. Patient is at mild risk for upcoming surgery. Recommend to resume aspirin post op per surgery   Possible underlying COPD. Imagine: +emphysema. No wheezing on exam. Recommend outpatient PFTs. Counseled to stop smoking   I will followup again tomorrow. Please contact me if I can be of assistance in the meanwhile. Thank you for this consultation.  Chief Complaint: Back pains   HPI: 72 y/o male with PMH of RA, Tobacco use, is admitted under orthopedics service for evaluation of lower back pains. Patient complained of worsening lower back pains with the last 6 weeks, associated with right  leg pain, numbness. No recent trauma. Underwent extensive work up, found to have pathologic fracture at L4 with suspected metastatic bronchogenic carcinoma. CT chest showed Right lower lobe mass lesion most consistent with bronchogenic carcinoma. MRI spine confirmed Metastatic lesion of the L4 vertebral body with associated pathologic fracture and posterior bulging of the vertebral body. Patient is being scheduled for spinal surgery on friday. hospitalist is asked for preop medical evaluation. Patient denies acute chest pains, no acute shortness of breath, no cough, no fevers, no exertional cardiopulmonary symptoms at home. He had one episode of nausea, vomiting yesterday in the setting of acute back pains. No abdominal pains. He reports BL LE mild paresthesias, pains. No focal weakness.   Review of Systems:  No shortness of breath No chest pain No changes in vision No headaches No focal weakness No diarrhea   unremarkable   Past Medical History:  Diagnosis Date  . Arthritis    Past Surgical History:  Procedure Laterality Date  . CATARACT EXTRACTION, BILATERAL     Social History:  reports that he has been smoking cigarettes. He has been smoking about 0.25 packs per day. He has never used smokeless tobacco. He reports that he does not use drugs. No history on file for alcohol.  Allergies  Allergen Reactions  . Minoxidil Palpitations  . Nicotine Palpitations  . Sulfamethoxazole Other (See Comments)    Both parents allergic, avoids med   History reviewed. No pertinent family history.  Prior to Admission medications   Medication Sig Start Date End Date Taking? Authorizing Provider  aspirin 81 MG chewable tablet Chew 81 mg by mouth daily.   Yes [provider]  Calcium Carb-Cholecalciferol (CALCIUM 600/VITAMIN D3) 600-800 MG-UNIT TABS Take 1 tablet by mouth daily.   Yes [provider]  docusate sodium (COLACE) 100 MG capsule Take 100 mg by mouth 2 (two) times daily as  needed for mild constipation.   Yes [provider]  folic acid (FOLVITE) 1 MG tablet Take 1 mg by mouth daily.   Yes [provider]  Homeopathic Products (T-RELIEF PAIN RELIEF) CREA Apply 1 application topically 2 (two) times daily. Hip/Back pain   Yes [provider]  HYDROmorphone (DILAUDID) 4 MG tablet Take 4 mg by mouth every 6 (six) hours as needed for severe pain.   Yes [provider]  hydroxypropyl methylcellulose / hypromellose (ISOPTO TEARS / GONIOVISC) 2.5 % ophthalmic solution Place 1-2 drops into both eyes as needed for dry eyes.   Yes [provider]  ibuprofen (ADVIL,MOTRIN) 200 MG tablet Take 400 mg by mouth every 6 (six) hours as needed for headache or mild pain.   Yes [provider]  Lysine 500 MG CAPS Take 500 mg by mouth daily as needed (chapped lips).   Yes [provider]  methotrexate 2.5 MG tablet Take 20 mg by mouth once a week. Wednesdays   Yes [provider]  Omega-3 350 MG CPDR Take 350 mg by mouth daily.   Yes [provider]  Potassium 99 MG TABS Take 99 mg by mouth daily as needed (leg cramps).   Yes [provider]  pseudoephedrine (SUDAFED) 120 MG 12 hr tablet Take 120 mg by mouth every 12 (twelve) hours as needed for congestion.   Yes [provider]   Physical Exam: Blood pressure 114/66, pulse (!) 59, temperature 98.1 F (36.7 C), temperature source Oral, resp. rate 18, SpO2 95 %. Vitals:   06/06/18 2020 06/07/18 0431  BP: 107/67 114/66  Pulse: 78 (!) 59  Resp: 18 18  Temp: 98.2 F (36.8 C) 98.1 F (36.7 C)  SpO2: 96% 95%     General:  No distress   Eyes: eom-i  ENT: no oral ulcers   Neck: supple   Cardiovascular: s1,s2 rrr  Respiratory: CTA BL  Abdomen: soft, nt   Skin: no rash   Musculoskeletal: no leg edema   Psychiatric: no hallucinations   Neurologic: CN 2-12 intact. 5/5 BL right leg limited due to pain  Labs on Admission:   Basic Metabolic Panel: Recent Labs  Lab 06/04/18 1417 06/05/18 0634  NA 136 139  K 3.1* 4.3  CL 101 103  CO2 24 27  GLUCOSE 104* 101*  BUN 13 10  CREATININE 0.84 0.77  CALCIUM 9.2 8.7*   Liver Function Tests: Recent Labs  Lab 06/04/18 1417 06/05/18 0634  AST 23 19  ALT 20 19  ALKPHOS 99 81  BILITOT 1.2 0.9  PROT 6.8 5.9*  ALBUMIN 3.4* 2.8*   No results for input(s): LIPASE, AMYLASE in the last 168 hours. No results for input(s): AMMONIA in the last 168 hours. CBC: Recent Labs  Lab 06/04/18 1417 06/05/18 0634  WBC 12.8* 10.7*  NEUTROABS 9.8*  --   HGB 13.4 11.6*  HCT 40.6 35.4*  MCV 94.0 96.5  PLT 287 255   Cardiac Enzymes: No results for input(s): CKTOTAL, CKMB, CKMBINDEX, TROPONINI in the last 168 hours. BNP: Invalid input(s): POCBNP CBG: No results for input(s): GLUCAP in the last 168 hours.  Radiological Exams on Admission: Nm Bone Scan Whole Body  Result Date: 06/07/2018 CLINICAL DATA:  Metastatic spine tumor. EXAM: NUCLEAR  MEDICINE WHOLE BODY BONE SCAN TECHNIQUE: Whole body anterior and posterior images were obtained approximately 3 hours after intravenous injection of radiopharmaceutical. RADIOPHARMACEUTICALS:  20.2 mCi Technetium-50m MDP IV COMPARISON:  MRI of June 04, 2018. CT scan of June 04, 2018. FINDINGS: Abnormal uptake is seen in left knee consistent with degenerative change. Abnormal uptake is seen at L4 level consistent with metastatic lesion seen on prior MRI. Focus of abnormal uptake is seen involving the posterior portion of a right rib which is consistent with metastatic disease. Abnormal uptake is seen involving the anterior portions of the left first rib and left clavicle consistent with metastatic disease as described on prior CT scan. IMPRESSION: Abnormal uptake is seen in midportion of right rib, L4 vertebral body and anterior portions of left first rib and left clavicle consistent with metastatic disease. This correlates with  findings seen on prior CT scan. Electronically Signed   By: Marijo Conception, M.D.   On: 06/07/2018 13:43    EKG: pend   Time spent: >35 minutes   Kinnie Feil [facility] Pager [phone]  If 7PM-7AM, please contact night-coverage [email or web address] Password [email or web address] 06/07/2018, 2:45 PM

## 2018-06-07 NOTE — Progress Notes (Signed)
Subjective: Patient reports pain steady around 5 out of 10. He states that he finds the brace very uncomfortable and does not know if it is "usable" for him. Denies SOB at rest and chest pain. Voiding well. Reports BM.   Objective: Vital signs in last 24 hours: Temp:  [98.1 F (36.7 C)-98.5 F (36.9 C)] 98.1 F (36.7 C) (12/16 0431) Pulse Rate:  [59-78] 59 (12/16 0431) Resp:  [18] 18 (12/16 0431) BP: (107-119)/(66-78) 114/66 (12/16 0431) SpO2:  [95 %-96 %] 95 % (12/16 0431)  Intake/Output from previous day: 12/15 0701 - 12/16 0700 In: -  Out: 975 [Urine:975]   Recent Labs    06/04/18 1417 06/05/18 0634  HGB 13.4 11.6*   Recent Labs    06/04/18 1417 06/05/18 0634  WBC 12.8* 10.7*  RBC 4.32 3.67*  HCT 40.6 35.4*  PLT 287 255   Recent Labs    06/04/18 1417 06/05/18 0634  NA 136 139  K 3.1* 4.3  CL 101 103  CO2 24 27  BUN 13 10  CREATININE 0.84 0.77  GLUCOSE 104* 101*  CALCIUM 9.2 8.7*   Recent Labs    06/04/18 1417  INR 0.99   Alert and oriented Neurologically intact Dorsiflexion/Plantar flexion intact Compartment soft    Assessment/Plan: L4 lytic lesion with L4 compression fracture deformity Will defer further care to Dr. Rolena Infante and Dr. Marin Olp. Appears that plan is for a L4 corpectomy with L3-L5 fusion and then supplemental posterior fixation most likely L2-L5 possibly even through S1.   Tamelia Michalowski L Zaydon Kinser 06/07/2018, 7:31 AM

## 2018-06-08 LAB — UPEP/UIFE/LIGHT CHAINS/TP, 24-HR UR
% BETA, Urine: 64.3 %
ALPHA 1 URINE: 2.2 %
Albumin, U: 9.7 %
Alpha 2, Urine: 8.2 %
Free Kappa Lt Chains,Ur: 39.2 mg/L — ABNORMAL HIGH (ref 1.35–24.19)
Free Kappa/Lambda Ratio: 8.63 (ref 2.04–10.37)
Free Lambda Lt Chains,Ur: 4.54 mg/L (ref 0.24–6.66)
GAMMA GLOBULIN URINE: 15.6 %
Total Protein, Urine: 22.9 mg/dL

## 2018-06-08 MED ORDER — PROMETHAZINE HCL 25 MG PO TABS
25.0000 mg | ORAL_TABLET | Freq: Four times a day (QID) | ORAL | Status: DC | PRN
  Administered 2018-06-08: 25 mg via ORAL
  Filled 2018-06-08: qty 1

## 2018-06-08 MED ORDER — PROMETHAZINE HCL 25 MG RE SUPP
25.0000 mg | Freq: Four times a day (QID) | RECTAL | Status: DC | PRN
  Filled 2018-06-08: qty 1

## 2018-06-08 MED ORDER — CYCLOBENZAPRINE HCL 10 MG PO TABS
5.0000 mg | ORAL_TABLET | Freq: Three times a day (TID) | ORAL | Status: DC | PRN
  Administered 2018-06-10: 5 mg via ORAL
  Filled 2018-06-08 (×2): qty 1

## 2018-06-08 MED ORDER — PROMETHAZINE HCL 25 MG PO TABS: 25.0000 mg | ORAL_TABLET | Freq: Four times a day (QID) | ORAL | Status: DC | PRN

## 2018-06-08 MED ORDER — PROMETHAZINE HCL 25 MG/ML IJ SOLN
12.5000 mg | Freq: Four times a day (QID) | INTRAMUSCULAR | Status: DC | PRN
  Administered 2018-06-08 – 2018-06-10 (×5): 12.5 mg via INTRAVENOUS
  Filled 2018-06-08 (×5): qty 1

## 2018-06-08 NOTE — Consult Note (Signed)
[facility] PROGRESS NOTE  Brian [name] OHY:[id number] DOB: 08/18/1945 DOA: 06/04/2018 PCP: [name], MD  Brief summary   72 y/o male with PMH of RA, Tobacco use, is admitted under orthopedics service for evaluation of lower back pains, found to have metastatic cancer with bone mets/pathologic fracture at L4.  Patient is being scheduled for spinal surgery this week. hospitalist is asked for preop medical evaluation.   Assessment/Plan:  Metastatic cancer with pathologic L4 fracture. CT chest: Right lower lobe mass lesion most consistent with bronchogenic carcinoma, mets to R 7th rib, L4 vertebra, two liver lesions. Bone scan: right rib, L4 vertebra, L first rib, left clavicle. MRI brain: no mets. Pt is being scheduled for spinal surgery this week, bone biopsy. Received steroids, valium, opioids, zometa, cont per ortho, oncology. -Preop: patient has no history of ischemic heart disease, no exertional symptoms at baseline, no diabetes, no history of strokes, no renal disease. ECG: no acute ischemic changes. RCRI risk is 3.9%. He may have underlying copd, but no s/s of exacerbation. Patient is at mild risk for upcoming surgery. Recommend to resume aspirin post op per surgery   Possible underlying COPD. Imagine: +emphysema. No wheezing on exam. Recommend outpatient PFTs. Counseled to stop smoking   I will followup again tomorrow. Please contact me if I can be of assistance in the meanwhile. Thank you for this consultation.   Code Status: full Family Communication: d/w patient, his family, RN (indicate person spoken with, relationship, and if by phone, the number) Disposition Plan: TF to [facility] per primary    Procedures:  MRI  Antibiotics: Anti-infectives (From admission, onward)   None        (indicate start date, and stop date if known)  HPI/Subjective: Reports mild nausea, muscle cramps.   Objective: Vitals:   06/07/18 2045 06/08/18 0532  BP: 120/60 114/69   Pulse: 72 (!) 57  Resp: 19 19  Temp: 98.4 F (36.9 C) 98 F (36.7 C)  SpO2: 95% 98%    Intake/Output Summary (Last 24 hours) at 06/08/2018 1131 Last data filed at 06/08/2018 0500 Gross per 24 hour  Intake 709.94 ml  Output 425 ml  Net 284.94 ml   There were no vitals filed for this visit.  Exam:   General:  No distress   Cardiovascular: s1.s2 rrr  Respiratory: CTA BL  Abdomen: soft, nt   Musculoskeletal: no leg edema    Data Reviewed: Basic Metabolic Panel: Recent Labs  Lab 06/04/18 1417 06/05/18 0634  NA 136 139  K 3.1* 4.3  CL 101 103  CO2 24 27  GLUCOSE 104* 101*  BUN 13 10  CREATININE 0.84 0.77  CALCIUM 9.2 8.7*   Liver Function Tests: Recent Labs  Lab 06/04/18 1417 06/05/18 0634  AST 23 19  ALT 20 19  ALKPHOS 99 81  BILITOT 1.2 0.9  PROT 6.8 5.9*  ALBUMIN 3.4* 2.8*   No results for input(s): LIPASE, AMYLASE in the last 168 hours. No results for input(s): AMMONIA in the last 168 hours. CBC: Recent Labs  Lab 06/04/18 1417 06/05/18 0634  WBC 12.8* 10.7*  NEUTROABS 9.8*  --   HGB 13.4 11.6*  HCT 40.6 35.4*  MCV 94.0 96.5  PLT 287 255   Cardiac Enzymes: No results for input(s): CKTOTAL, CKMB, CKMBINDEX, TROPONINI in the last 168 hours. BNP (last 3 results) No results for input(s): BNP in the last 8760 hours.  ProBNP (last 3 results) No results for input(s): PROBNP in the last  8760 hours.  CBG: No results for input(s): GLUCAP in the last 168 hours.  No results found for this or any previous visit (from the past 240 hour(s)).   Studies: Mr [name] Contrast  Result Date: 06/07/2018 CLINICAL DATA:  Staging small cell lung cancer. EXAM: MRI HEAD WITHOUT AND WITH CONTRAST TECHNIQUE: Multiplanar, multiecho pulse sequences of the brain and surrounding structures were obtained without and with intravenous contrast. CONTRAST:  9 cc Gadavist. COMPARISON:  None. FINDINGS: Brain: Diffusion imaging does not show any acute or subacute  infarction. There are a few old small vessel insults within the pons and cerebellum. Cerebral hemispheres show moderate chronic small-vessel ischemic changes of the deep and subcortical white matter without large vessel territory infarction. No evidence of hemorrhage, hydrocephalus or extra-axial collection. After contrast administration, there is no abnormal enhancement to suggest primary or metastatic mass lesion. Vascular: Major vessels at the base of the brain show flow. Skull and upper cervical spine: Negative Sinuses/Orbits: Clear/normal Other: None IMPRESSION: No evidence of metastatic disease. Moderate chronic small-vessel ischemic changes affecting the brain as outlined above. Electronically Signed   By: [name] M.D.   On: 06/07/2018 14:45   Nm Bone Scan Whole Body  Result Date: 06/07/2018 CLINICAL DATA:  Metastatic spine tumor. EXAM: NUCLEAR MEDICINE WHOLE BODY BONE SCAN TECHNIQUE: Whole body anterior and posterior images were obtained approximately 3 hours after intravenous injection of radiopharmaceutical. RADIOPHARMACEUTICALS:  20.2 mCi Technetium-7m MDP IV COMPARISON:  MRI of June 04, 2018. CT scan of June 04, 2018. FINDINGS: Abnormal uptake is seen in left knee consistent with degenerative change. Abnormal uptake is seen at L4 level consistent with metastatic lesion seen on prior MRI. Focus of abnormal uptake is seen involving the posterior portion of a right rib which is consistent with metastatic disease. Abnormal uptake is seen involving the anterior portions of the left first rib and left clavicle consistent with metastatic disease as described on prior CT scan. IMPRESSION: Abnormal uptake is seen in midportion of right rib, L4 vertebral body and anterior portions of left first rib and left clavicle consistent with metastatic disease. This correlates with findings seen on prior CT scan. Electronically Signed   By: Marijo Conception, M.D.   On: 06/07/2018 13:43    Scheduled  Meds: . aspirin  81 mg Oral Daily  . dexamethasone  20 mg Intravenous Q24H  . folic acid  1 mg Oral Daily  . [START ON 06/09/2018] methotrexate  20 mg Oral Weekly  . oxyCODONE  10 mg Oral Q12H  . sodium chloride flush  3 mL Intravenous Q12H   Continuous Infusions: . sodium chloride 50 mL/hr at 06/08/18 0500  . sodium chloride      Active Problems:   Lumbar spine tumor   Bronchitis due to tobacco use   Metastatic cancer ([place])    Time spent: >35 minutes     Kinnie Feil  [facility] Pager [phone]. If 7PM-7AM, please contact night-coverage at [email or web address], password [facility] 06/08/2018, 11:31 AM  LOS: 4 days

## 2018-06-08 NOTE — Progress Notes (Signed)
Overall, there really is no change with respect to his status.  The MRI of the brain is negative for any CNS metastasis.  As such, I think that the L4 corpectomy is "on go" and should be happening tomorrow.  I really believe that this is the best way to help his quality of life.  The bone scan really does not show anything that is revealing that we do not know from the CT scan.  It really does not deter from him having the spinal procedure.  He is on the OxyContin.  It might be helping a little bit.  We will plan to see him over at [facility].  I will eagerly anticipate the surgery as we will then have pathologic specimen that we can send off for a myriad of molecular studies.  This will really help [place] with our prognosis and treatment recommendations.  I probably would get radiation oncology on board now so that they can help plan for radiation therapy post L4 corpectomy/fusion.  Lattie Haw, MD  John 1:14

## 2018-06-08 NOTE — Progress Notes (Signed)
Subjective:   Procedure(s) (LRB): L4 Corpectomy, L3-5 anterior reconsgtruction, posterior L2-S1 fusion/instrumentation (N/A) Patient reports pain as 5 on 0-10 scaleBack pain persists. No major Neurological Deficit. DR. Rolena Infante plans to transfer to [facility].on Wed..    Objective: Vital signs in last 24 hours: Temp:  [98 F (36.7 C)-98.4 F (36.9 C)] 98 F (36.7 C) (12/17 0532) Pulse Rate:  [57-72] 57 (12/17 0532) Resp:  [19] 19 (12/17 0532) BP: (114-120)/(60-69) 114/69 (12/17 0532) SpO2:  [95 %-98 %] 98 % (12/17 0532)  Intake/Output from previous day: 12/16 0701 - 12/17 0700 In: 829.9 [P.O.:120; I.V.:709.9] Out: 425 [Urine:425] Intake/Output this shift: No intake/output data recorded.  No results for input(s): HGB in the last 72 hours. No results for input(s): WBC, RBC, HCT, PLT in the last 72 hours. No results for input(s): NA, K, CL, CO2, BUN, CREATININE, GLUCOSE, CALCIUM in the last 72 hours. No results for input(s): LABPT, INR in the last 72 hours.  Dorsiflexion/Plantar flexion intact  Bone scan shows a few Mets.,Ribs and Clavicle and L-4.  Assessment/Plan:   Procedure(s) (LRB): L4 Corpectomy, L3-5 anterior reconsgtruction, posterior L2-S1 fusion/instrumentation (N/A) Up with therapy.    Brian [name] 06/08/2018, 7:26 AM

## 2018-06-09 LAB — MRSA PCR SCREENING: MRSA by PCR: NEGATIVE

## 2018-06-09 MED ORDER — KCL IN DEXTROSE-NACL 20-5-0.9 MEQ/L-%-% IV SOLN
INTRAVENOUS | Status: AC
  Administered 2018-06-09 – 2018-06-10 (×2): via INTRAVENOUS
  Filled 2018-06-09 (×3): qty 1000

## 2018-06-09 MED ORDER — PANTOPRAZOLE SODIUM 40 MG IV SOLR
40.0000 mg | Freq: Two times a day (BID) | INTRAVENOUS | Status: AC
  Administered 2018-06-09 – 2018-06-10 (×3): 40 mg via INTRAVENOUS
  Filled 2018-06-09 (×3): qty 40

## 2018-06-09 MED ORDER — HYDROMORPHONE HCL 1 MG/ML IJ SOLN
0.5000 mg | INTRAMUSCULAR | Status: DC | PRN
  Administered 2018-06-09 – 2018-06-10 (×5): 1 mg via INTRAVENOUS
  Administered 2018-06-10: 0.5 mg via INTRAVENOUS
  Administered 2018-06-11 (×2): 1 mg via INTRAVENOUS
  Filled 2018-06-09 (×3): qty 1
  Filled 2018-06-09: qty 0.5
  Filled 2018-06-09 (×4): qty 1

## 2018-06-09 MED ORDER — ALBUTEROL SULFATE (2.5 MG/3ML) 0.083% IN NEBU
2.5000 mg | INHALATION_SOLUTION | Freq: Four times a day (QID) | RESPIRATORY_TRACT | Status: DC | PRN
  Administered 2018-06-10 – 2018-06-18 (×2): 2.5 mg via RESPIRATORY_TRACT
  Filled 2018-06-09 (×3): qty 3

## 2018-06-09 MED ORDER — IPRATROPIUM-ALBUTEROL 0.5-2.5 (3) MG/3ML IN SOLN: 3.0000 mL | Freq: Two times a day (BID) | RESPIRATORY_TRACT | Status: DC

## 2018-06-09 MED ORDER — IPRATROPIUM-ALBUTEROL 0.5-2.5 (3) MG/3ML IN SOLN
3.0000 mL | Freq: Two times a day (BID) | RESPIRATORY_TRACT | Status: DC
  Administered 2018-06-09 (×2): 3 mL via RESPIRATORY_TRACT
  Filled 2018-06-09 (×3): qty 3

## 2018-06-09 NOTE — Consult Note (Signed)
[facility] PROGRESS NOTE  Brian [name] ZJI:[id number] DOB: [birth year] DOA: 06/04/2018 PCP: [name], MD  Brief summary   72 y/o male with PMH of RA, Tobacco use, is admitted under orthopedics service for evaluation of lower back pains, found to have metastatic cancer with bone mets/pathologic fracture at L4.  Patient is being scheduled for spinal surgery this week. hospitalist is asked for preop medical evaluation.   Assessment/Plan:  Metastatic cancer with pathologic L4 fracture. CT chest: Right lower lobe mass lesion most consistent with bronchogenic carcinoma, mets to R 7th rib, L4 vertebra, two liver lesions. Bone scan: right rib, L4 vertebra, L first rib, left clavicle. MRI brain: no mets. Pt is being scheduled for spinal surgery this week, bone biopsy. Received steroids, valium, opioids, zometa, cont per ortho, oncology. -Preop: patient has no history of ischemic heart disease, no exertional symptoms at baseline, no diabetes, no history of strokes, no renal disease. ECG: no acute ischemic changes. RCRI risk is 3.9%. He may have underlying copd, but no s/s of exacerbation. Patient is at mild risk for upcoming surgery. Recommend to resume aspirin post op per surgery. Incentive spirometry   Underlying COPD. Imagine: +emphysema. No wheezing on exam. Recommend outpatient PFTs. Counseled to stop smoking. Start bronchodilators, prn. Incentive spirometry .   Nausea, vomiting. Family says this is due to oxycontin. Abdominal exam is benign. + BM. D/c OxyContin. Cont antiemetics. PPI for GERD  GERD. Start PPI.   Patient is being Transfrred to [facility] nfor surgery. hospitalist will sign off. Please call with questions.  Thank you for this consultation.   Code Status: full Family Communication: d/w patient, his family, RN (indicate person spoken with, relationship, and if by phone, the number) Disposition Plan: TF to [facility] per primary     Procedures:  MRI  Antibiotics: Anti-infectives (From admission, onward)   None       (indicate start date, and stop date if known)  HPI/Subjective: Reports mild nausea, muscle cramps.   Objective: Vitals:   06/08/18 2011 06/09/18 0556  BP: 130/72 (!) 142/87  Pulse: 78 83  Resp: 18 18  Temp: 98.3 F (36.8 C) 98.2 F (36.8 C)  SpO2: 94% 98%    Intake/Output Summary (Last 24 hours) at 06/09/2018 1003 Last data filed at 06/09/2018 0350 Gross per 24 hour  Intake -  Output 750 ml  Net -750 ml   There were no vitals filed for this visit.  Exam:   General:  No distress   Cardiovascular: s1.s2 rrr  Respiratory: CTA BL  Abdomen: soft, nt   Musculoskeletal: no leg edema    Data Reviewed: Basic Metabolic Panel: Recent Labs  Lab 06/04/18 1417 06/05/18 0634  NA 136 139  K 3.1* 4.3  CL 101 103  CO2 24 27  GLUCOSE 104* 101*  BUN 13 10  CREATININE 0.84 0.77  CALCIUM 9.2 8.7*   Liver Function Tests: Recent Labs  Lab 06/04/18 1417 06/05/18 0634  AST 23 19  ALT 20 19  ALKPHOS 99 81  BILITOT 1.2 0.9  PROT 6.8 5.9*  ALBUMIN 3.4* 2.8*   No results for input(s): LIPASE, AMYLASE in the last 168 hours. No results for input(s): AMMONIA in the last 168 hours. CBC: Recent Labs  Lab 06/04/18 1417 06/05/18 0634  WBC 12.8* 10.7*  NEUTROABS 9.8*  --   HGB 13.4 11.6*  HCT 40.6 35.4*  MCV 94.0 96.5  PLT 287 255   Cardiac Enzymes: No results for input(s): CKTOTAL, CKMB, CKMBINDEX,  TROPONINI in the last 168 hours. BNP (last 3 results) No results for input(s): BNP in the last 8760 hours.  ProBNP (last 3 results) No results for input(s): PROBNP in the last 8760 hours.  CBG: No results for input(s): GLUCAP in the last 168 hours.  No results found for this or any previous visit (from the past 240 hour(s)).   Studies: Mr Jeri Cos WR Contrast  Result Date: 06/07/2018 CLINICAL DATA:  Staging small cell lung cancer. EXAM: MRI HEAD WITHOUT AND WITH  CONTRAST TECHNIQUE: Multiplanar, multiecho pulse sequences of the brain and surrounding structures were obtained without and with intravenous contrast. CONTRAST:  9 cc Gadavist. COMPARISON:  None. FINDINGS: Brain: Diffusion imaging does not show any acute or subacute infarction. There are a few old small vessel insults within the pons and cerebellum. Cerebral hemispheres show moderate chronic small-vessel ischemic changes of the deep and subcortical white matter without large vessel territory infarction. No evidence of hemorrhage, hydrocephalus or extra-axial collection. After contrast administration, there is no abnormal enhancement to suggest primary or metastatic mass lesion. Vascular: Major vessels at the base of the brain show flow. Skull and upper cervical spine: Negative Sinuses/Orbits: Clear/normal Other: None IMPRESSION: No evidence of metastatic disease. Moderate chronic small-vessel ischemic changes affecting the brain as outlined above. Electronically Signed   By: [name] M.D.   On: 06/07/2018 14:45   Nm Bone Scan Whole Body  Result Date: 06/07/2018 CLINICAL DATA:  Metastatic spine tumor. EXAM: NUCLEAR MEDICINE WHOLE BODY BONE SCAN TECHNIQUE: Whole body anterior and posterior images were obtained approximately 3 hours after intravenous injection of radiopharmaceutical. RADIOPHARMACEUTICALS:  20.2 mCi Technetium-17m MDP IV COMPARISON:  MRI of June 04, 2018. CT scan of June 04, 2018. FINDINGS: Abnormal uptake is seen in left knee consistent with degenerative change. Abnormal uptake is seen at L4 level consistent with metastatic lesion seen on prior MRI. Focus of abnormal uptake is seen involving the posterior portion of a right rib which is consistent with metastatic disease. Abnormal uptake is seen involving the anterior portions of the left first rib and left clavicle consistent with metastatic disease as described on prior CT scan. IMPRESSION: Abnormal uptake is seen in midportion of  right rib, L4 vertebral body and anterior portions of left first rib and left clavicle consistent with metastatic disease. This correlates with findings seen on prior CT scan. Electronically Signed   By: Marijo Conception, M.D.   On: 06/07/2018 13:43    Scheduled Meds: . dexamethasone  20 mg Intravenous Q24H  . folic acid  1 mg Oral Daily  . methotrexate  20 mg Oral Weekly  . sodium chloride flush  3 mL Intravenous Q12H   Continuous Infusions: . sodium chloride      Active Problems:   Lumbar spine tumor   Bronchitis due to tobacco use   Metastatic cancer (HCC)    Time spent: >35 minutes     Kinnie Feil  [facility] Pager [phone]. If 7PM-7AM, please contact night-coverage at [email or web address], password [facility] 06/09/2018, 10:03 AM  LOS: 5 days

## 2018-06-09 NOTE — Progress Notes (Signed)
Pt and [name] stated that he was told by  Dr. Kathlene November (rheumatologist) to stop taking methotraxate.

## 2018-06-09 NOTE — Progress Notes (Signed)
Report called to 4N at [facility]. [facility] called for transport. Pt and wife updated at the bedside.

## 2018-06-10 NOTE — Progress Notes (Signed)
Gave pt anti-nausea med prior to oral pain medication.  Pt vomited.  Will continue with IV pain medication as prescribed.  Pt appetite very poor due to nausea and lack of appetite.  Will continue to encourage PO intake and administer nausea medication as prescribed.  Danton Clap, RN

## 2018-06-10 NOTE — Progress Notes (Signed)
    Subjective: Procedure(s) (LRB): L4 Corpectomy, L3-5 anterior reconsgtruction, posterior L2-S1 fusion/instrumentation (N/A)    Patient reports pain as 5 on 0-10 scale.  Reports unchanged leg pain reports lumbar back pain   Positive void Negative bowel movement Positive flatus Negative chest pain or shortness of breath  Objective: Vital signs in last 24 hours: Temp:  [97.8 F (36.6 C)-98.5 F (36.9 C)] 97.8 F (36.6 C) (12/19 1150) Pulse Rate:  [43-96] 63 (12/19 1150) Resp:  [16-18] 17 (12/19 1150) BP: (109-160)/(61-79) 115/67 (12/19 1150) SpO2:  [93 %-97 %] 97 % (12/19 1150)  Intake/Output from previous day: 12/18 0701 - 12/19 0700 In: 1117 [P.O.:120; I.V.:997] Out: 350 [Urine:350]  Labs: No results for input(s): WBC, RBC, HCT, PLT in the last 72 hours. No results for input(s): NA, K, CL, CO2, BUN, CREATININE, GLUCOSE, CALCIUM in the last 72 hours. No results for input(s): LABPT, INR in the last 72 hours.  Physical Exam: Neurologically intact Intact pulses distally Compartment soft Body mass index is 28.44 kg/m.   Assessment/Plan: Patient stable  xrays n/a Continue mobilization with physical therapy Continue care  1. Patient is stable 2. Plan on surgery tomorrow - explained procedure again to family and patient.  Also reviewed risks and benefits.  All questions encouraged and addressed.   Melina Schools, MD [facility] [phone]

## 2018-06-11 ENCOUNTER — Encounter (HOSPITAL_COMMUNITY)
Admission: AD | Disposition: A | Discharge status: Rehabilitation Facility | Payer: Self-pay | Source: Ambulatory Visit | Attending: Orthopedic Surgery

## 2018-06-11 ENCOUNTER — Inpatient Hospital Stay (HOSPITAL_COMMUNITY): Payer: Medicare Other

## 2018-06-11 ENCOUNTER — Inpatient Hospital Stay (HOSPITAL_COMMUNITY): Payer: Medicare Other | Admitting: Certified Registered"

## 2018-06-11 ENCOUNTER — Encounter (HOSPITAL_COMMUNITY): Payer: Self-pay | Admitting: Surgery

## 2018-06-11 DIAGNOSIS — C3490 Malignant neoplasm of unspecified part of unspecified bronchus or lung: Secondary | ICD-10-CM

## 2018-06-11 DIAGNOSIS — J9602 Acute respiratory failure with hypercapnia: Secondary | ICD-10-CM

## 2018-06-11 HISTORY — PX: ANTERIOR CERVICAL CORPECTOMY: SHX1159

## 2018-06-11 LAB — POCT I-STAT 7, (LYTES, BLD GAS, ICA,H+H)
ACID-BASE DEFICIT: 5 mmol/L — AB (ref 0.0–2.0)
Acid-base deficit: 2 mmol/L (ref 0.0–2.0)
Acid-base deficit: 7 mmol/L — ABNORMAL HIGH (ref 0.0–2.0)
Acid-base deficit: 8 mmol/L — ABNORMAL HIGH (ref 0.0–2.0)
Bicarbonate: 23.5 mmol/L (ref 20.0–28.0)
Bicarbonate: 18.6 mmol/L — ABNORMAL LOW (ref 20.0–28.0)
Bicarbonate: 19.3 mmol/L — ABNORMAL LOW (ref 20.0–28.0)
Bicarbonate: 23.3 mmol/L (ref 20.0–28.0)
Calcium, Ion: 1.09 mmol/L — ABNORMAL LOW (ref 1.15–1.40)
Calcium, Ion: 0.98 mmol/L — ABNORMAL LOW (ref 1.15–1.40)
Calcium, Ion: 1.2 mmol/L (ref 1.15–1.40)
Calcium, Ion: 0.99 mmol/L — ABNORMAL LOW (ref 1.15–1.40)
HCT: 29 % — ABNORMAL LOW (ref 39.0–52.0)
HCT: 29 % — ABNORMAL LOW (ref 39.0–52.0)
HCT: 25 % — ABNORMAL LOW (ref 39.0–52.0)
HCT: 27 % — ABNORMAL LOW (ref 39.0–52.0)
Hemoglobin: 9.9 g/dL — ABNORMAL LOW (ref 13.0–17.0)
Hemoglobin: 9.9 g/dL — ABNORMAL LOW (ref 13.0–17.0)
Hemoglobin: 8.5 g/dL — ABNORMAL LOW (ref 13.0–17.0)
Hemoglobin: 9.2 g/dL — ABNORMAL LOW (ref 13.0–17.0)
O2 Saturation: 100 %
O2 Saturation: 98 %
O2 Saturation: 96 %
O2 Saturation: 100 %
PH ART: 7.219 — AB (ref 7.350–7.450)
PH ART: 7.209 — AB (ref 7.350–7.450)
PO2 ART: 261 mmHg — AB (ref 83.0–108.0)
Patient temperature: 35.9
Potassium: 3.3 mmol/L — ABNORMAL LOW (ref 3.5–5.1)
Potassium: 2.8 mmol/L — ABNORMAL LOW (ref 3.5–5.1)
Potassium: 3.5 mmol/L (ref 3.5–5.1)
Potassium: 3.8 mmol/L (ref 3.5–5.1)
Sodium: 140 mmol/L (ref 135–145)
Sodium: 142 mmol/L (ref 135–145)
Sodium: 141 mmol/L (ref 135–145)
Sodium: 140 mmol/L (ref 135–145)
TCO2: 25 mmol/L (ref 22–32)
TCO2: 20 mmol/L — ABNORMAL LOW (ref 22–32)
TCO2: 21 mmol/L — ABNORMAL LOW (ref 22–32)
TCO2: 25 mmol/L (ref 22–32)
pCO2 arterial: 41.8 mmHg (ref 32.0–48.0)
pCO2 arterial: 38.2 mmHg (ref 32.0–48.0)
pCO2 arterial: 47.2 mmHg (ref 32.0–48.0)
pCO2 arterial: 58.5 mmHg — ABNORMAL HIGH (ref 32.0–48.0)
pH, Arterial: 7.352 (ref 7.350–7.450)
pH, Arterial: 7.295 — ABNORMAL LOW (ref 7.350–7.450)
pO2, Arterial: 352 mmHg — ABNORMAL HIGH (ref 83.0–108.0)
pO2, Arterial: 122 mmHg — ABNORMAL HIGH (ref 83.0–108.0)
pO2, Arterial: 98 mmHg (ref 83.0–108.0)

## 2018-06-11 LAB — PREPARE RBC (CROSSMATCH)

## 2018-06-11 LAB — CBC
HCT: 33.5 % — ABNORMAL LOW (ref 39.0–52.0)
Hemoglobin: 11.2 g/dL — ABNORMAL LOW (ref 13.0–17.0)
MCH: 29.8 pg (ref 26.0–34.0)
MCHC: 33.4 g/dL (ref 30.0–36.0)
MCV: 89.1 fL (ref 80.0–100.0)
Platelets: 181 10*3/uL (ref 150–400)
RBC: 3.76 MIL/uL — ABNORMAL LOW (ref 4.22–5.81)
RDW: 14.8 % (ref 11.5–15.5)
WBC: 13.7 10*3/uL — ABNORMAL HIGH (ref 4.0–10.5)
nRBC: 0 % (ref 0.0–0.2)

## 2018-06-11 LAB — POCT I-STAT 4, (NA,K, GLUC, HGB,HCT)
Glucose, Bld: 95 mg/dL (ref 70–99)
HCT: 28 % — ABNORMAL LOW (ref 39.0–52.0)
Hemoglobin: 9.5 g/dL — ABNORMAL LOW (ref 13.0–17.0)
Potassium: 3.9 mmol/L (ref 3.5–5.1)
Sodium: 141 mmol/L (ref 135–145)

## 2018-06-11 LAB — POCT I-STAT 3, ART BLOOD GAS (G3+)
Acid-base deficit: 3 mmol/L — ABNORMAL HIGH (ref 0.0–2.0)
Bicarbonate: 21.2 mmol/L (ref 20.0–28.0)
O2 Saturation: 100 %
Patient temperature: 98.6
TCO2: 22 mmol/L (ref 22–32)
pCO2 arterial: 35.7 mmHg (ref 32.0–48.0)
pH, Arterial: 7.381 (ref 7.350–7.450)
pO2, Arterial: 222 mmHg — ABNORMAL HIGH (ref 83.0–108.0)

## 2018-06-11 LAB — ABO/RH: ABO/RH(D): O NEG

## 2018-06-11 LAB — GLUCOSE, CAPILLARY: Glucose-Capillary: 105 mg/dL — ABNORMAL HIGH (ref 70–99)

## 2018-06-11 SURGERY — ANTERIOR CERVICAL CORPECTOMY
Anesthesia: General | Site: Flank | Laterality: Left

## 2018-06-11 MED ORDER — HYDROMORPHONE HCL 1 MG/ML IJ SOLN: 1.0000 mg | INTRAMUSCULAR | Status: DC | PRN

## 2018-06-11 MED ORDER — FENTANYL CITRATE (PF) 100 MCG/2ML IJ SOLN
50.0000 ug | INTRAMUSCULAR | Status: DC | PRN
  Administered 2018-06-11 – 2018-06-12 (×8): 75 ug via INTRAVENOUS
  Filled 2018-06-11 (×8): qty 2

## 2018-06-11 MED ORDER — PROPOFOL 1000 MG/100ML IV EMUL
INTRAVENOUS | Status: AC
  Filled 2018-06-11: qty 200

## 2018-06-11 MED ORDER — IOPAMIDOL (ISOVUE-370) INJECTION 76%
100.0000 mL | Freq: Once | INTRAVENOUS | Status: AC | PRN
  Administered 2018-06-11: 100 mL via INTRAVENOUS

## 2018-06-11 MED ORDER — ONDANSETRON HCL 4 MG/2ML IJ SOLN: 4.0000 mg | Freq: Four times a day (QID) | INTRAMUSCULAR | Status: DC | PRN

## 2018-06-11 MED ORDER — ROCURONIUM BROMIDE 10 MG/ML (PF) SYRINGE
PREFILLED_SYRINGE | INTRAVENOUS | Status: DC | PRN
  Administered 2018-06-11: 50 mg via INTRAVENOUS

## 2018-06-11 MED ORDER — OXYCODONE HCL 5 MG PO TABS: 10.0000 mg | ORAL_TABLET | ORAL | Status: DC | PRN

## 2018-06-11 MED ORDER — OXYCODONE HCL 5 MG PO TABS: 5.0000 mg | ORAL_TABLET | ORAL | Status: DC | PRN

## 2018-06-11 MED ORDER — FAMOTIDINE 20 MG PO TABS
20.0000 mg | ORAL_TABLET | Freq: Two times a day (BID) | ORAL | Status: DC
  Administered 2018-06-11 – 2018-06-12 (×2): 20 mg via ORAL
  Filled 2018-06-11 (×2): qty 1

## 2018-06-11 MED ORDER — MIDAZOLAM HCL 2 MG/2ML IJ SOLN
1.0000 mg | INTRAMUSCULAR | Status: DC | PRN
  Administered 2018-06-11 – 2018-06-12 (×2): 1 mg via INTRAVENOUS
  Filled 2018-06-11 (×3): qty 2

## 2018-06-11 MED ORDER — PROPOFOL 1000 MG/100ML IV EMUL
INTRAVENOUS | Status: AC
  Filled 2018-06-11: qty 100

## 2018-06-11 MED ORDER — PROPOFOL 500 MG/50ML IV EMUL
INTRAVENOUS | Status: DC | PRN
  Administered 2018-06-11: 90 ug/kg/min via INTRAVENOUS

## 2018-06-11 MED ORDER — ONDANSETRON HCL 4 MG PO TABS: 4.0000 mg | ORAL_TABLET | Freq: Four times a day (QID) | ORAL | Status: DC | PRN

## 2018-06-11 MED ORDER — FENTANYL CITRATE (PF) 250 MCG/5ML IJ SOLN
INTRAMUSCULAR | Status: AC
  Filled 2018-06-11: qty 5

## 2018-06-11 MED ORDER — ALBUMIN HUMAN 5 % IV SOLN
INTRAVENOUS | Status: DC | PRN
  Administered 2018-06-11 (×3): via INTRAVENOUS

## 2018-06-11 MED ORDER — METHOCARBAMOL 500 MG PO TABS: 500.0000 mg | ORAL_TABLET | Freq: Four times a day (QID) | ORAL | Status: DC | PRN

## 2018-06-11 MED ORDER — ORAL CARE MOUTH RINSE
15.0000 mL | OROMUCOSAL | Status: DC
  Administered 2018-06-11 – 2018-06-13 (×16): 15 mL via OROMUCOSAL

## 2018-06-11 MED ORDER — MENTHOL 3 MG MT LOZG: 1.0000 | LOZENGE | OROMUCOSAL | Status: DC | PRN

## 2018-06-11 MED ORDER — ACETAMINOPHEN 10 MG/ML IV SOLN
INTRAVENOUS | Status: AC
  Filled 2018-06-11: qty 100

## 2018-06-11 MED ORDER — TRANEXAMIC ACID-NACL 1000-0.7 MG/100ML-% IV SOLN
1000.0000 mg | INTRAVENOUS | Status: AC
  Administered 2018-06-11: 1000 mg via INTRAVENOUS
  Filled 2018-06-11: qty 100

## 2018-06-11 MED ORDER — FENTANYL CITRATE (PF) 100 MCG/2ML IJ SOLN
50.0000 ug | INTRAMUSCULAR | Status: AC | PRN
  Administered 2018-06-11 – 2018-06-12 (×3): 50 ug via INTRAVENOUS
  Filled 2018-06-11 (×3): qty 2

## 2018-06-11 MED ORDER — DOCUSATE SODIUM 50 MG/5ML PO LIQD: 100.0000 mg | Freq: Two times a day (BID) | ORAL | Status: DC | PRN

## 2018-06-11 MED ORDER — BUPIVACAINE-EPINEPHRINE (PF) 0.25% -1:200000 IJ SOLN
INTRAMUSCULAR | Status: AC
  Filled 2018-06-11: qty 30

## 2018-06-11 MED ORDER — POTASSIUM CHLORIDE 10 MEQ/100ML IV SOLN
10.0000 meq | INTRAVENOUS | Status: DC
  Administered 2018-06-11: 10 meq via INTRAVENOUS
  Filled 2018-06-11 (×2): qty 100

## 2018-06-11 MED ORDER — PROPOFOL 500 MG/50ML IV EMUL
INTRAVENOUS | Status: AC
  Filled 2018-06-11: qty 50

## 2018-06-11 MED ORDER — LIDOCAINE 2% (20 MG/ML) 5 ML SYRINGE
INTRAMUSCULAR | Status: DC | PRN
  Administered 2018-06-11: 60 mg via INTRAVENOUS
  Administered 2018-06-11: 100 mg via INTRAVENOUS

## 2018-06-11 MED ORDER — GLYCOPYRROLATE 0.2 MG/ML IJ SOLN
INTRAMUSCULAR | Status: DC | PRN
  Administered 2018-06-11: 0.2 mg via INTRAVENOUS

## 2018-06-11 MED ORDER — LACTATED RINGERS IV SOLN
INTRAVENOUS | Status: DC | PRN
  Administered 2018-06-11: 13:00:00 via INTRAVENOUS

## 2018-06-11 MED ORDER — MIDAZOLAM HCL 2 MG/2ML IJ SOLN
1.0000 mg | INTRAMUSCULAR | Status: DC | PRN
  Administered 2018-06-12: 1 mg via INTRAVENOUS
  Filled 2018-06-11: qty 2

## 2018-06-11 MED ORDER — BUPIVACAINE-EPINEPHRINE 0.25% -1:200000 IJ SOLN
INTRAMUSCULAR | Status: DC | PRN
  Administered 2018-06-11: 20 mL

## 2018-06-11 MED ORDER — SODIUM CHLORIDE 0.9 % IV SOLN: 10.0000 mL/h | Freq: Once | INTRAVENOUS | Status: DC

## 2018-06-11 MED ORDER — BISACODYL 10 MG RE SUPP
10.0000 mg | Freq: Every day | RECTAL | Status: DC | PRN
  Administered 2018-06-18: 10 mg via RECTAL
  Filled 2018-06-11 (×2): qty 1

## 2018-06-11 MED ORDER — METHOCARBAMOL 1000 MG/10ML IJ SOLN
500.0000 mg | Freq: Four times a day (QID) | INTRAVENOUS | Status: DC | PRN
  Filled 2018-06-11: qty 5

## 2018-06-11 MED ORDER — SUCCINYLCHOLINE CHLORIDE 20 MG/ML IJ SOLN
INTRAMUSCULAR | Status: DC | PRN
  Administered 2018-06-11: 80 mg via INTRAVENOUS

## 2018-06-11 MED ORDER — SODIUM CHLORIDE 0.9% FLUSH
3.0000 mL | Freq: Two times a day (BID) | INTRAVENOUS | Status: DC
  Administered 2018-06-11 – 2018-06-12 (×3): 3 mL via INTRAVENOUS

## 2018-06-11 MED ORDER — ACETAMINOPHEN 650 MG RE SUPP: 650.0000 mg | RECTAL | Status: DC | PRN

## 2018-06-11 MED ORDER — SODIUM CHLORIDE 0.9% FLUSH: 3.0000 mL | INTRAVENOUS | Status: DC | PRN

## 2018-06-11 MED ORDER — SODIUM CHLORIDE 0.9 % IV SOLN: 250.0000 mL | INTRAVENOUS | Status: DC

## 2018-06-11 MED ORDER — CALCIUM CHLORIDE 10 % IV SOLN
INTRAVENOUS | Status: DC | PRN
  Administered 2018-06-11 (×3): 200 mg via INTRAVENOUS

## 2018-06-11 MED ORDER — PROPOFOL 10 MG/ML IV BOLUS
INTRAVENOUS | Status: DC | PRN
  Administered 2018-06-11: 20 mg via INTRAVENOUS
  Administered 2018-06-11: 100 mg via INTRAVENOUS

## 2018-06-11 MED ORDER — PHENOL 1.4 % MT LIQD: 1.0000 | OROMUCOSAL | Status: DC | PRN

## 2018-06-11 MED ORDER — PROPOFOL 10 MG/ML IV BOLUS
INTRAVENOUS | Status: AC
  Filled 2018-06-11: qty 40

## 2018-06-11 MED ORDER — FENTANYL CITRATE (PF) 100 MCG/2ML IJ SOLN
50.0000 ug | INTRAMUSCULAR | Status: DC | PRN
  Administered 2018-06-11: 50 ug via INTRAVENOUS
  Filled 2018-06-11: qty 2

## 2018-06-11 MED ORDER — LACTATED RINGERS IV SOLN
INTRAVENOUS | Status: DC
  Administered 2018-06-11 – 2018-06-13 (×4): via INTRAVENOUS
  Filled 2018-06-11: qty 1000

## 2018-06-11 MED ORDER — LACTATED RINGERS IV SOLN
INTRAVENOUS | Status: DC
  Administered 2018-06-11 (×2): via INTRAVENOUS

## 2018-06-11 MED ORDER — ACETAMINOPHEN 325 MG PO TABS
650.0000 mg | ORAL_TABLET | ORAL | Status: DC | PRN
  Administered 2018-06-17: 650 mg via ORAL
  Filled 2018-06-11: qty 2

## 2018-06-11 MED ORDER — THROMBIN 20000 UNITS EX SOLR
CUTANEOUS | Status: AC
  Filled 2018-06-11: qty 20000

## 2018-06-11 MED ORDER — FENTANYL CITRATE (PF) 100 MCG/2ML IJ SOLN
INTRAMUSCULAR | Status: DC | PRN
  Administered 2018-06-11: 100 ug via INTRAVENOUS
  Administered 2018-06-11: 50 ug via INTRAVENOUS

## 2018-06-11 MED ORDER — 0.9 % SODIUM CHLORIDE (POUR BTL) OPTIME
TOPICAL | Status: DC | PRN
  Administered 2018-06-11 (×3): 1000 mL

## 2018-06-11 MED ORDER — SODIUM CHLORIDE 0.9 % IV SOLN
0.0125 ug/kg/min | INTRAVENOUS | Status: DC
  Administered 2018-06-11: .1 ug/kg/min via INTRAVENOUS
  Filled 2018-06-11 (×4): qty 2000

## 2018-06-11 MED ORDER — VANCOMYCIN HCL IN DEXTROSE 1-5 GM/200ML-% IV SOLN
1000.0000 mg | Freq: Two times a day (BID) | INTRAVENOUS | Status: DC
  Administered 2018-06-11 – 2018-06-13 (×5): 1000 mg via INTRAVENOUS
  Filled 2018-06-11 (×4): qty 200

## 2018-06-11 MED ORDER — CHLORHEXIDINE GLUCONATE 0.12% ORAL RINSE (MEDLINE KIT)
15.0000 mL | Freq: Two times a day (BID) | OROMUCOSAL | Status: DC
  Administered 2018-06-11 – 2018-06-13 (×4): 15 mL via OROMUCOSAL

## 2018-06-11 MED ORDER — VANCOMYCIN HCL 10 G IV SOLR
1500.0000 mg | Freq: Once | INTRAVENOUS | Status: AC
  Administered 2018-06-11: 1500 mg via INTRAVENOUS
  Filled 2018-06-11: qty 1500

## 2018-06-11 MED ORDER — SODIUM CHLORIDE 0.9 % IV SOLN
INTRAVENOUS | Status: DC | PRN
  Administered 2018-06-11: 50 ug/min via INTRAVENOUS

## 2018-06-11 MED ORDER — IOPAMIDOL (ISOVUE-370) INJECTION 76%
INTRAVENOUS | Status: AC
  Filled 2018-06-11: qty 100

## 2018-06-11 SURGICAL SUPPLY — 71 items
BLADE SURG 15 STRL LF DISP TIS (BLADE) ×1 IMPLANT
BLADE SURG 15 STRL SS (BLADE) ×2
BONE VIVIGEN FORMABLE 5.4CC (Bone Implant) ×3 IMPLANT
CAGE XCORE 2 TI 018 35-55 (Cage) ×2 IMPLANT
CAGE XCORE 2 TI 018MM 35-55MM (Cage) ×1 IMPLANT
CANISTER SUCT 3000ML PPV (MISCELLANEOUS) ×3 IMPLANT
CAP END 18X22X60 SPINAL TI 4 (Miscellaneous) ×1 IMPLANT
COVER SURGICAL LIGHT HANDLE (MISCELLANEOUS) ×3 IMPLANT
COVER WAND RF STERILE (DRAPES) IMPLANT
CRADLE DONUT ADULT HEAD (MISCELLANEOUS) ×3 IMPLANT
DRAPE C-ARM 42X72 X-RAY (DRAPES) ×3 IMPLANT
DRAPE C-ARMOR (DRAPES) ×3 IMPLANT
DRAPE INCISE IOBAN 66X45 STRL (DRAPES) ×3 IMPLANT
DRAPE POUCH INSTRU U-SHP 10X18 (DRAPES) ×3 IMPLANT
DRAPE U-SHAPE 47X51 STRL (DRAPES) ×6 IMPLANT
DURAPREP 26ML APPLICATOR (WOUND CARE) ×3 IMPLANT
ELECT BLADE 4.0 EZ CLEAN MEGAD (MISCELLANEOUS) ×3
ELECT COATED BLADE 2.86 ST (ELECTRODE) IMPLANT
ELECT NEEDLE BLADE 2-5/6 (NEEDLE) IMPLANT
ELECT PENCIL ROCKER SW 15FT (MISCELLANEOUS) ×3 IMPLANT
ELECT REM PT RETURN 9FT ADLT (ELECTROSURGICAL) ×3
ELECTRODE BLDE 4.0 EZ CLN MEGD (MISCELLANEOUS) ×1 IMPLANT
ELECTRODE REM PT RTRN 9FT ADLT (ELECTROSURGICAL) ×1 IMPLANT
END CAP SPNL 8D 60X18X22X (Miscellaneous) ×1 IMPLANT
ENDCAP SPINAL 18X22X60MM TI 4D (Miscellaneous) ×2 IMPLANT
EVACUATOR 1/8 PVC DRAIN (DRAIN) ×3 IMPLANT
GLOVE BIO SURGEON STRL SZ 6.5 (GLOVE) ×4 IMPLANT
GLOVE BIO SURGEONS STRL SZ 6.5 (GLOVE) ×2
GLOVE BIOGEL PI IND STRL 7.5 (GLOVE) ×1 IMPLANT
GLOVE BIOGEL PI IND STRL 8.5 (GLOVE) ×2 IMPLANT
GLOVE BIOGEL PI INDICATOR 7.5 (GLOVE) ×2
GLOVE BIOGEL PI INDICATOR 8.5 (GLOVE) ×4
GLOVE SS BIOGEL STRL SZ 8.5 (GLOVE) ×2 IMPLANT
GLOVE SUPERSENSE BIOGEL SZ 8.5 (GLOVE) ×4
GOWN STRL REUS W/ TWL LRG LVL3 (GOWN DISPOSABLE) ×1 IMPLANT
GOWN STRL REUS W/ TWL XL LVL3 (GOWN DISPOSABLE) ×1 IMPLANT
GOWN STRL REUS W/TWL 2XL LVL3 (GOWN DISPOSABLE) ×6 IMPLANT
GOWN STRL REUS W/TWL LRG LVL3 (GOWN DISPOSABLE) ×2
GOWN STRL REUS W/TWL XL LVL3 (GOWN DISPOSABLE) ×2
HEAD FEMORAL LFIT V40 32MM +12 (Miscellaneous) ×2 IMPLANT
HEMOSTAT ARISTA ABSORB 3G PWDR (MISCELLANEOUS) ×3 IMPLANT
HEMOSTAT SURGICEL 2X14 (HEMOSTASIS) ×3 IMPLANT
KIT BASIN OR (CUSTOM PROCEDURE TRAY) ×3 IMPLANT
KIT DILATOR XLIF 5 (KITS) ×1 IMPLANT
KIT SURGICAL ACCESS MAXCESS 4 (KITS) ×3 IMPLANT
KIT TURNOVER KIT B (KITS) ×3 IMPLANT
KIT XLIF (KITS) ×2
MODULE EMG NEEDLE SSEP NVM5 (NEEDLE) ×3 IMPLANT
MODULE NVM5 NEXT GEN EMG (NEEDLE) ×3 IMPLANT
NEEDLE SPNL 18GX3.5 QUINCKE PK (NEEDLE) IMPLANT
NS IRRIG 1000ML POUR BTL (IV SOLUTION) ×3 IMPLANT
PACK LAMINECTOMY ORTHO (CUSTOM PROCEDURE TRAY) ×3 IMPLANT
PACK UNIVERSAL I (CUSTOM PROCEDURE TRAY) ×3 IMPLANT
PAD ARMBOARD 7.5X6 YLW CONV (MISCELLANEOUS) ×6 IMPLANT
PUTTY DBX 5CC (Putty) ×3 IMPLANT
SCREW LOCK SMALL (Screw) ×12 IMPLANT
SPONGE LAP 4X18 RFD (DISPOSABLE) ×3 IMPLANT
STAPLER VISISTAT 35W (STAPLE) ×3 IMPLANT
SURGIFLO W/THROMBIN 8M KIT (HEMOSTASIS) ×15 IMPLANT
SUT BONE WAX W31G (SUTURE) ×3 IMPLANT
SUT ETHILON 3 0 FSL (SUTURE) ×3 IMPLANT
SUT MON AB 3-0 SH 27 (SUTURE) ×4
SUT MON AB 3-0 SH27 (SUTURE) ×2 IMPLANT
SUT VIC AB 1 CT1 18XCR BRD 8 (SUTURE) ×2 IMPLANT
SUT VIC AB 1 CT1 8-18 (SUTURE) ×4
SUT VIC AB 2-0 CT1 18 (SUTURE) ×6 IMPLANT
SYR BULB IRRIGATION 50ML (SYRINGE) ×3 IMPLANT
SYR CONTROL 10ML LL (SYRINGE) ×3 IMPLANT
TAPE CLOTH 4X10 WHT NS (GAUZE/BANDAGES/DRESSINGS) ×6 IMPLANT
TOWEL OR 17X26 10 PK STRL BLUE (TOWEL DISPOSABLE) ×6 IMPLANT
TRAY FOLEY MTR SLVR 16FR STAT (SET/KITS/TRAYS/PACK) ×3 IMPLANT

## 2018-06-11 NOTE — Progress Notes (Signed)
I stop by to see [name].  He is going for surgery today.  He is going to have the L4 corpectomy with the fusion basically of the lumbar spine.  I think that he is in very good shape for this.  He came in in decent shape.  We should have quite a lot of specimen for analysis to see what type of bronchogenic carcinoma this is.  I think that we had to make sure that we are aggressive with DVT prophylaxis after surgery.  Given that he does have bronchogenic carcinoma, and that he would not be all that ambulatory, we need to make sure that he is on anticoagulation after surgery when it is safe.  I am sure that he will have compression devices placed right after surgery.  I would think starting next week, we probably could get him on Lovenox for DVT prophylaxis.  I would think that he is going to need some physical therapy after surgery.  He really has not ambulated for quite a while.  I think the week that he has been in the hospital, he basically has been in bed.  It seems like his pain is doing a little bit better.  He had been on OxyContin.  This is no longer on his medication record.  He is on Dilaudid.  He was on some Decadron.  This may have been a little bit helpful.  Again, this will help his quality of life.  This is our primary goal with respect to therapy for [name].  I very much appreciate the help of orthopedic surgery.  We will follow-up next week.  We will eagerly await the pathology report.   Lattie Haw, MD  [place] 5:4

## 2018-06-11 NOTE — Care Management Important Message (Signed)
Important Message  Patient Details  Name: Brian [name] MRN: [id number] Date of Birth: Jun 18, 1946   Medicare Important Message Given:  Yes    [name] 06/11/2018, 2:10 PM

## 2018-06-11 NOTE — Consult Note (Signed)
NAME:  Brian [name], MRN:  [id number], DOB:  [birth year], LOS: 7 ADMISSION DATE:  06/04/2018, CONSULTATION DATE:  06/11/2018 REFERRING MD:  Dr. Rolena Infante, CHIEF COMPLAINT:   Ventilator management  History of present illness   72 year old man with history of RA, emphysema, tobacco use admitted on 06/04/2018 for lower back pain found to have pathologic L4 fracture and CT chest with right lower lobe mass concerning for bronchogenic carcinoma with metastasis to bone. He underwent L4 Corpectomy, L3-5 anterior reconstruction with ortho on 06/11/2018. During the procedure he received 4 units of pRBC and 2 units of FFP.   Past Medical History  Rheumatoid arthritis, emphysema, tobacco use   Significant Hospital Events   12/13 admission 12/20 L4 Corpectomy, L3-5 anterior reconstruction  Procedures:  12/20 L4 Corpectomy, L3-5 anterior reconstruction  Significant Diagnostic Tests:  CTA abd/pelvis on 12/20 retroperitoneal hematoma seen  CXR on 12/20: Endotracheal tube tip is 8.2 cm above the base the carina. Known right lower lobe mass projects over the right hilum. Chronic interstitial lung disease. Atelectasis or infiltrate at the right base.  CT chest/abd/pelvis w/ contrast on 12/13: Right lower lobe mass lesion. Destructive lesions in the left clavicle, right seventh ribs and L4 vertebral body. Small mediastinal lymph nodes measuring up to 1.5 cm. Two lesions in the liver. Advanced emphysema. Calcific aortic and coronary atherosclerosis. 3.3 cm abdominal aortic aneurysm is identified. Gallstones without evidence of cholecystitis. Cystic lesion in the subcutaneous tissues of the anterior left chest wall. Simple lipoma right gluteal musculature.  MR brain w/ w/o contrast on 12/16 No evidence of metastatic disease. Moderate chronic small-vessel ischemic changes   NM bone scan 12/16 Abnormal uptake is seen in midportion of right rib, L4 vertebral body and anterior portions of left first rib and  left clavicle  MR lumbar spine 12/13 Metastatic lesion of the L4 vertebral body with associated pathologic fracture and posterior bulging of the vertebral body, overlying epidural thickening, severe spinal canal stenosis and severe right neural foraminal stenosis. Moderate right L5-S1 neural foraminal stenosis  EKG 12/16 with sinus bradycardia, no previous for comparison  Micro Data:  MRSA screen neg on 12/18  Antimicrobials:  Vanc x 1 on 12/20  Interim history/subjective:  Intubated and sedated.  Objective   Blood pressure 121/70, pulse 61, temperature 98.8 F (37.1 C), temperature source Axillary, resp. rate 16, height 5\' 10"  (1.778 m), weight 89.9 kg, SpO2 95 %.        Intake/Output Summary (Last 24 hours) at 06/11/2018 1945 Last data filed at 06/11/2018 1943 Gross per 24 hour  Intake 5894 ml  Output 2800 ml  Net 3094 ml   Filed Weights   06/09/18 1107  Weight: 89.9 kg    Examination: General: NAD, sedated HENT: normocephalic, ETT in place Lungs: clear to auscultation bilaterally, no wheezes/rales/rhonchi Cardiovascular: regular rate and rhythm Abdomen: soft, mildly distended and tympanic to percussion, nontender Extremities: no LE edema Neuro: sedated GU: foley in place  Assessment & Plan:  72 year old man with history of RA, emphysema, tobacco use admitted on 06/04/2018 here with pathologic L4 fracture and right lower lobe mass concerning for bronchogenic carcinoma with metastasis to bone now s/p L4 Corpectomy, L3-5 anterior reconstruction with ortho on 06/11/2018  Acute hypercarbic respiratory failure: ABG 7.2/58.5/261 at 1800. Currently on PRVC with FiO2 of 80% and PEEP of 5, rate of 15 and TV of 580.  -Continue vent support for now -Wean FiO2 for goal O2 sat >88% -Recheck ABG -ETT noted to be  8.2 cm from carina, advanced 3cm.   Acute blood loss anemia: Received 4 units of pRBC and 2 units of FFP in OR 12/20. Hgb 9.5 at 1800.  -Trend CBC Q6hr for  tonight -IR following, available for embolization if bleeding worsens  L4 fracture: s/p L4 Corpectomy, L3-5 anterior reconstruction with ortho on 06/11/2018 -per ortho  RA, chronic pain: On dilaudid PO at home. Was previously on methotrexate. Had 3 weeklong pred tapers prior to hospitalization. -Home dilaudid held  -Home methotrexate currently held  Emphysema, possible COPD: Continue albuterol prn  Abdominal aortic aneurysm: Outpatient follow up   Best practice:  Diet: NPO Pain/Anxiety/Delirium protocol (if indicated): fent prn  VAP protocol (if indicated): Peridex BID and rest of protocol ordered DVT prophylaxis: SCDs GI prophylaxis: pepcid Glucose control: normoglycemic Mobility: PT with precautions per ortho Code Status: FULL Family Communication: discussed with wife and cousin at bedside 12/20 Disposition: Continue in ICU  Labs   CBC: Recent Labs  Lab 06/05/18 0634 06/11/18 1406 06/11/18 1550 06/11/18 1711 06/11/18 1801 06/11/18 1810  WBC 10.7*  --   --   --   --   --   HGB 11.6* 9.9* 9.9* 8.5* 9.5* 9.2*  HCT 35.4* 29.0* 29.0* 25.0* 28.0* 27.0*  MCV 96.5  --   --   --   --   --   PLT 255  --   --   --   --   --     Basic Metabolic Panel: Recent Labs  Lab 06/05/18 0634 06/11/18 1406 06/11/18 1550 06/11/18 1711 06/11/18 1801 06/11/18 1810  NA 139 140 142 141 141 140  K 4.3 3.3* 2.8* 3.5 3.9 3.8  CL 103  --   --   --   --   --   CO2 27  --   --   --   --   --   GLUCOSE 101*  --   --   --  95  --   BUN 10  --   --   --   --   --   CREATININE 0.77  --   --   --   --   --   CALCIUM 8.7*  --   --   --   --   --    GFR: Estimated Creatinine Clearance: 94.2 mL/min (by C-G formula based on SCr of 0.77 mg/dL). Recent Labs  Lab 06/05/18 0634  WBC 10.7*    Liver Function Tests: Recent Labs  Lab 06/05/18 0634  AST 19  ALT 19  ALKPHOS 81  BILITOT 0.9  PROT 5.9*  ALBUMIN 2.8*    ABG    Component Value Date/Time   PHART 7.209 (L) 06/11/2018 1810    PCO2ART 58.5 (H) 06/11/2018 1810   PO2ART 261.0 (H) 06/11/2018 1810   HCO3 23.3 06/11/2018 1810   TCO2 25 06/11/2018 1810   ACIDBASEDEF 5.0 (H) 06/11/2018 1810   O2SAT 100.0 06/11/2018 1810     Review of Systems:   Unable to obtain due to sedation  Surgical History    Past Surgical History:  Procedure Laterality Date  . CATARACT EXTRACTION, BILATERAL       Social History   reports that he has been smoking cigarettes. He has been smoking about 0.25 packs per day. He has never used smokeless tobacco. He reports that he does not use drugs.   Family History   His family history is not on file.   Allergies Allergies  Allergen Reactions  .  Minoxidil Palpitations  . Nicotine Palpitations  . Sulfamethoxazole Other (See Comments)    Both parents allergic, avoids med     Home Medications  Prior to Admission medications   Medication Sig Start Date End Date Taking? Authorizing Provider  aspirin 81 MG chewable tablet Chew 81 mg by mouth daily.   Yes [provider]  Calcium Carb-Cholecalciferol (CALCIUM 600/VITAMIN D3) 600-800 MG-UNIT TABS Take 1 tablet by mouth daily.   Yes [provider]  docusate sodium (COLACE) 100 MG capsule Take 100 mg by mouth 2 (two) times daily as needed for mild constipation.   Yes [provider]  folic acid (FOLVITE) 1 MG tablet Take 1 mg by mouth daily.   Yes [provider]  Homeopathic Products (T-RELIEF PAIN RELIEF) CREA Apply 1 application topically 2 (two) times daily. Hip/Back pain   Yes [provider]  HYDROmorphone (DILAUDID) 4 MG tablet Take 4 mg by mouth every 6 (six) hours as needed for severe pain.   Yes [provider]  hydroxypropyl methylcellulose / hypromellose (ISOPTO TEARS / GONIOVISC) 2.5 % ophthalmic solution Place 1-2 drops into both eyes as needed for dry eyes.   Yes [provider]  ibuprofen (ADVIL,MOTRIN) 200 MG tablet Take 400 mg by mouth every 6 (six) hours as  needed for headache or mild pain.   Yes [provider]  Lysine 500 MG CAPS Take 500 mg by mouth daily as needed (chapped lips).   Yes [provider]  Omega-3 350 MG CPDR Take 350 mg by mouth daily.   Yes [provider]  Potassium 99 MG TABS Take 99 mg by mouth daily as needed (leg cramps).   Yes [provider]  pseudoephedrine (SUDAFED) 120 MG 12 hr tablet Take 120 mg by mouth every 12 (twelve) hours as needed for congestion.   Yes [provider]     Critical care time: The patient is critically ill with multiple organ systems failure and requires high complexity decision making for assessment and support, frequent evaluation and titration of therapies, application of advanced monitoring technologies and extensive interpretation of multiple databases.   Critical Care Time devoted to patient care services described in this note is  45 Minutes. This time reflects time of care of this signee. This critical care time does not reflect procedure time, or teaching time or supervisory time of PA/NP/Med student/Med Resident etc but could involve care discussion time.  Jacques Earthly, M.D. [facility] Care Medicine After hours pager: [phone].

## 2018-06-11 NOTE — Progress Notes (Signed)
Pharmacy Antibiotic Note  Brian [name] is a 72 y.o. male admitted on 06/04/2018 s/p back surgery. Pharmacy has been consulted for Vancomycin dosing for surgical prophylaxis. Pt with drain in place.  Pt received Vanc 1.5gm IV pre-op ~1230. Pt does NOT have allergy to PCN so might be candidate for narrowing to Cefazolin.  Plan: Vancomycin 1gm IV q12h Will f/u micro data, renal function, and pt's clinical condition Vanc trough prn F/u drain removal   Height: 5\' 10"  (177.8 cm) Weight: 198 lb 3.1 oz (89.9 kg) IBW/kg (Calculated) : 73  Temp (24hrs), Avg:98.4 F (36.9 C), Min:98 F (36.7 C), Max:98.8 F (37.1 C)  Recent Labs  Lab 06/05/18 0634  WBC 10.7*  CREATININE 0.77    Estimated Creatinine Clearance: 94.2 mL/min (by C-G formula based on SCr of 0.77 mg/dL).    Allergies  Allergen Reactions  . Minoxidil Palpitations  . Nicotine Palpitations  . Sulfamethoxazole Other (See Comments)    Both parents allergic, avoids med    Antimicrobials this admission: 12/20 Vancomycin >>   Microbiology results: 12/18 MRSA PCR: negative  Thank you for allowing pharmacy to be a part of this patient's care.  Sherlon Handing, PharmD, BCPS Clinical pharmacist  **Pharmacist phone directory can now be found on [email or web address] ([email or web address]).  Listed under [place]. 06/11/2018 8:19 PM

## 2018-06-11 NOTE — Progress Notes (Signed)
    Subjective: Procedure(s) (LRB): L4 Corpectomy, L3-5 anterior reconsgtruction, posterior L2-S1 fusion/instrumentation (N/A) Day of Surgery  Patient reports pain as 5 on 0-10 scale.  Reports unchanged leg pain reports back pain   Positive void Negative bowel movement Positive flatus Negative chest pain or shortness of breath  Objective: Vital signs in last 24 hours: Temp:  [98 F (36.7 C)-98.8 F (37.1 C)] 98.8 F (37.1 C) (12/20 0819) Pulse Rate:  [61-71] 61 (12/20 0819) Resp:  [16-20] 16 (12/20 0819) BP: (112-142)/(69-80) 121/70 (12/20 0819) SpO2:  [91 %-95 %] 95 % (12/20 0819)  Intake/Output from previous day: 12/19 0701 - 12/20 0700 In: 1098.1 [P.O.:240; I.V.:858.1] Out: 1300 [Urine:1300]  Labs: No results for input(s): WBC, RBC, HCT, PLT in the last 72 hours. No results for input(s): NA, K, CL, CO2, BUN, CREATININE, GLUCOSE, CALCIUM in the last 72 hours. No results for input(s): LABPT, INR in the last 72 hours.  Physical Exam: Neurologically intact ABD soft Intact pulses distally No cellulitis present Compartment soft Body mass index is 28.44 kg/m.   Assessment/Plan: Patient stable  xrays n/a Continue mobilization with physical therapy Continue care  Plan on moving forward with lateral L4 corpectomy with L3-5 reconstruction, and then posterior spinal fusion L2-L5 possible for decompression.  At this point time I have discussed the treatment plan with the patient and his wife he had again and all of their questions been concerns were addressed.  Melina Schools, MD [facility] [phone]

## 2018-06-11 NOTE — Op Note (Signed)
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  Pathological fracture of L4 with spinal stenosis.  Postoperative diagnosis: Same  Operative procedure: L4 corpectomy with L3-5 reconstruction.  Complications: At the completion of the corpectomy I noted bleeding from the right contralateral segmental vessel.  I attempted to coagulate this and controlled the bleeding.  Although he remained hemodynamically stable I was concerned that he would have ongoing significant bleeding.  After discussing with the anesthesiologist I elected to stop the case at this point.  The plan will be to take him to CT for an angiogram and if there was not vessel that could be embolized the radiologist would do that.  Otherwise we will admit him to the ICU and monitor his progress.  A drain was placed.  Implant system used: NuVasive lateral corpectomy I cage.  4 degree lordotic superior endplate, 8 degree lordotic inferior endplate.  Cage was a 41-55  18 mm cage.  It was expanded to its maximum height.  Intraoperative neuro monitoring: Remained normal throughout with no abnormal free running EMGs or SSEPs.    EBL: 1 L  Transfusion: 4 units red blood cells, 2 units FFP.  Indications: This is a pleasant 72 year old gentleman who presents with acute onset of severe back buttock and neuropathic leg pain.  During the course of his work-up it was noted that he had a L4 metastatic lesion.  The patient was admitted for his complete work-up and he presents today for his definitive surgical management.  I had a long discussion with the patient and his wife about risks benefits and alternatives to surgery and they expressed an understanding.  Surgical plan today was to do a lateral corpectomy of L4 with anterior reconstruction and then supplemental posterior fixation.  Operative report  Patient is brought the operating room placed upon the operating room table.  After successful induction of general  anesthesia and endotracheal intubation teds SCDs and a Foley were inserted.  Patient was turned on the side left side up.  Axillary roll was placed and all bony prominences were well-padded.  With the arms properly positioned the legs padded the patient was thoroughly taped to the bed.  X-ray was used to identify the L4-5 and the L3-4 disc space to confirm that we could adequately visualize them.  Once the patient was secured to the table the lateral flank was prepped and draped in a standard fashion.  A timeout was taken to confirm patient procedure and all other important data.  Fluoroscopy was used to identify the anterior posterior margin of the L4 vertebral body.  Once this was mapped out I infiltrated the skin area with quarter percent Marcaine with epinephrine and made a transverse incision.  I sharply dissected through the adipose tissue down to the deep fascia.  A second small incision was made 1 fingerbreadth posterior and I bluntly dissected down to the retroperitoneal fascia. I entered into the retroperitoneal space with my finger again bluntly dissected.  I then dissected from the undersurface of the oblique muscles until I could visualize my finger in the initial incision site.  The first trocar was advanced down and placed on top of the psoas muscle I stimulated circumferentially to ensure was not traumatizing the lumbar plexus.  I then advanced through the psoas to the lateral aspect of the L4-5 disc space.  A pin was used to hold this trocar in place and I sequentially dilated each time stimulating circumferentially.  The working trocar was then  placed and secured into position.  I then gently mobilized this posteriorly until I had excellent visualization of the lateral aspect of the L4-5 disc space.  I then stimulated behind each of the blades to confirm that I was not traumatizing the lumbar plexus.  At no point with her abnormal EMG activity.  An annulotomy was performed at L4-5 and I used  a combination of box osteotomes, curettes and Kerrison rongeurs to remove all of the disc material.  I made sure had bleeding endplates on the L5 superior side.  While I did remove some of the disc material from the in inferior aspect of L4 I not as aggressive and thorough as I was at L5.  This is primarily because the L4 vertebral body will be removed as a corpectomy.  Once I had removal of the disc material and visualization of the L5 endplate I then repositioned my retractor using the same technique I had used a this level.  At this point the L3-4 disc space was now visualized.  Again an annulotomy was performed and I removed all of the disc material in a similar technique.      At this point time I had the L3-4 and the L4-5 discectomy is complete and I could clearly visualize portion of the L5 vertebral body.  I then repositioned using the dilators in the mid body of L4 and sequentially dilated and positioned my retractor so it was in the posterior portion of the vertebral body.  This allowed me to see the entire lateral aspect of the L4 vertebral body.  Significant bleeding was noted from the left segmental vessel.  This was identified and easily coagulated with bipolar electrocautery.  Once I had coagulated this I then used a large osteotome placed anteriorly and went across to the contralateral side.  A second cut was made posteriorly.  Both these cuts made with the large osteotome using fluoroscopy guidance.  Once I had these 2 cuts I could then completely remove the L4 vertebral body.  There was significant lytic areas with obvious metastatic material not consistent with normal healthy bone.  All of this was collected for pathology.  As I was removing the vertebral body and collecting sample I was able to use my nerve hook to palpate the posterior wall.  It was soft and freely mobile.  I continue to harvest the specimen for pathology.  As I was removing the remaining portion of the corpectomy the right  segmental vessel began to bleed.  I attempted to coagulate this in a similar fashion but I was unsuccessful.  At this point the patient was up to approximately 1 L of blood loss.  He had received 4 units of packed red blood cells, and 2 units of FFP.    At this point I was concerned that even though he was hemodynamically stable could change at any moment.  He was an elderly gentleman with underlying metastatic cancer and so I wanted to proceed with caution.  I did contact the on-call neuro interventional radiologist to discuss potential embolization.  He recommended a CT angiogram after the surgery and if there was a bleeding vessel such as a segmental artery that could be embolized that he would do that.  At this point I irrigated the wound copiously normal saline and using the FloSeal and other hemostatic agents I did get the bleeding to significantly diminished.  I then elected after irrigating the wound to place the cage.  The cage was  assembled and then packed with the allograft bone.  I then malleted it into position.  I was very pleased with its overall position.  I was able to get it across to the contralateral side and remain in the midline.  I was able to expanded to its maximum height and get excellent purchase at both the L3 and the L5 endplate.  Furthermore the bleeding actually decreased once the cage was expanded.  At this point with the bleeding somewhat controlled I still elected to stop the surgery in favor of the CT angiogram and possible embolization.  In the event, I would plan to return to the OR once he is stable to move forward with the posterior pedicle screw fixation portion of the case.   I have discussed this with the patient's wife and she was in agreement with the plan.  Once the cage was properly positioned I then irrigated and then placed a deep drain.  I closed the external oblique fascia with interrupted #1 Vicryl sutures and then superficial wounds were closed with 2-0 Vicryl  suture and 3-0 Monocryl.  Steri-Strips and dry dressings were applied.  The patient will be transferred to the CT scanner intubated and depending upon the results we will either have the embolization or return to the ICU intubated.  Once he is stable and drainage is we can return to the OR for the MIS pedicle screw fixation L3-5.  The patient had excellent fixation on I do not think the addition of the L2 pedicle screw or the S1 pedicle screw will be needed.  I do think that the posterior fixation L3-5 will be adequate enough.  At the end of the case all needle sponge counts were correct.

## 2018-06-11 NOTE — Anesthesia Preprocedure Evaluation (Addendum)
Anesthesia Evaluation  Patient identified by MRN, date of birth, ID band Patient awake    Reviewed: Allergy & Precautions, NPO status , Patient's Chart, lab work & pertinent test results  Airway Mallampati: III  TM Distance: >3 FB Neck ROM: Full    Dental no notable dental hx.    Pulmonary Current Smoker,  Lung cancer   Pulmonary exam normal breath sounds clear to auscultation       Cardiovascular negative cardio ROS Normal cardiovascular exam Rhythm:Regular Rate:Normal  ECG: SB, rate 57   Neuro/Psych negative neurological ROS  negative psych ROS   GI/Hepatic negative GI ROS, Neg liver ROS,   Endo/Other  negative endocrine ROS  Renal/GU negative Renal ROS     Musculoskeletal negative musculoskeletal ROS (+)   Abdominal   Peds  Hematology  (+) anemia ,   Anesthesia Other Findings metastatic cancer with pathologic fracture  Reproductive/Obstetrics                            Anesthesia Physical Anesthesia Plan  ASA: III  Anesthesia Plan: General   Post-op Pain Management:    Induction: Intravenous  PONV Risk Score and Plan: 2 and Ondansetron, Dexamethasone and Treatment may vary due to age or medical condition  Airway Management Planned: Oral ETT  Additional Equipment: Arterial line  Intra-op Plan:   Post-operative Plan: Extubation in OR  Informed Consent: I have reviewed the patients History and Physical, chart, labs and discussed the procedure including the risks, benefits and alternatives for the proposed anesthesia with the patient or authorized representative who has indicated his/her understanding and acceptance.   Dental advisory given  Plan Discussed with: CRNA  Anesthesia Plan Comments:        Anesthesia Quick Evaluation

## 2018-06-11 NOTE — Anesthesia Postprocedure Evaluation (Signed)
Anesthesia Post Note  Patient: Brian [name]  Procedure(s) Performed: L4 Corpectomy, L3-5 anterior reconstruction, (Left Flank)     Patient location during evaluation: SICU Anesthesia Type: General Level of consciousness: sedated Pain management: pain level controlled Vital Signs Assessment: post-procedure vital signs reviewed and stable Respiratory status: patient remains intubated per anesthesia plan Cardiovascular status: stable Postop Assessment: no apparent nausea or vomiting Anesthetic complications: no    Last Vitals:  Vitals:   06/11/18 2000 06/11/18 2100  BP: (!) 88/61 105/79  Pulse: 100 88  Resp: 15 18  Temp: (!) 36.3 C   SpO2: 100% 100%    Last Pain:  Vitals:   06/11/18 2108  TempSrc:   PainSc: Asleep                 Ryan P Ellender

## 2018-06-11 NOTE — Progress Notes (Signed)
Pt remain  NPO for [facility] DF this am at  11.45 am

## 2018-06-11 NOTE — Progress Notes (Signed)
Patient off unit for procedeure

## 2018-06-11 NOTE — Brief Op Note (Signed)
06/11/2018  6:34 PM  PATIENT:  [name]  72 y.o. male  PRE-OPERATIVE DIAGNOSIS:  L28metastatic cancer with pathologic fracture  POST-OPERATIVE DIAGNOSIS:  L52metastatic cancer with pathologic fracture  PROCEDURE:  Procedure(s) with comments: L4 Corpectomy, L3-5 anterior reconstruction, (Left) - 6.5 hrs- Ok for this day/time per April  SURGEON:  Surgeon(s) and Role:    * Melina Schools, MD - Primary  PHYSICIAN ASSISTANT:   ASSISTANTS: RNFA   ANESTHESIA:   general  EBL:  1000 mL   BLOOD ADMINISTERED:4 units  CC PRBC and 2 units FFP  DRAINS: 1 hemovac   LOCAL MEDICATIONS USED:  MARCAINE     SPECIMEN:  Source of Specimen:  L4 vertebral body and disc  DISPOSITION OF SPECIMEN:  PATHOLOGY  COUNTS:  YES  TOURNIQUET:  * No tourniquets in log *  DICTATION: .Dragon Dictation  PLAN OF CARE: Admit to inpatient   PATIENT DISPOSITION:  ICU - intubated and hemodynamically stable.

## 2018-06-11 NOTE — Anesthesia Procedure Notes (Signed)
Procedure Name: Intubation Date/Time: 06/11/2018 1:17 PM Performed by: Cleda Daub, CRNA Pre-anesthesia Checklist: Patient identified, Emergency Drugs available, Suction available and Patient being monitored Patient Re-evaluated:Patient Re-evaluated prior to induction Oxygen Delivery Method: Circle system utilized Preoxygenation: Pre-oxygenation with 100% oxygen Induction Type: IV induction Ventilation: Mask ventilation without difficulty and Mask ventilation throughout procedure Laryngoscope Size: Mac and 3 Grade View: Grade I Tube type: Oral Tube size: 8.0 mm Number of attempts: 1 Airway Equipment and Method: Stylet Placement Confirmation: ETT inserted through vocal cords under direct vision,  positive ETCO2 and breath sounds checked- equal and bilateral Secured at: 23 cm Tube secured with: Tape Dental Injury: Teeth and Oropharynx as per pre-operative assessment

## 2018-06-11 NOTE — Consult Note (Signed)
            [facility] CM Primary Care Navigator  06/11/2018  Brian [name] 1946/03/27 [id number]   Went to see patient at the bedside to identify possible discharge needs butstaff reports that he is on the OR for surgery at the moment.(L4 corpectomy with L3- L5 anterior reconstruction and posterior L2- S1 fusion of the lumbar spine.)  Will attempt tofollow-up andsee patientforfurther THN-CM needs when available.     Addendum (06/14/18):   Attempt to see patient to identify possible discharge needs but he was transferred to ICU (4N 15) Postoperatively he remained intubated and was transferred to the ICU for recovery  Will try at another time tofollow-up andsee patient for further THN-CM needswhen available and out of ICU.   For additional questions please contact:  Edwena Felty A. Alejandria Wessells, BSN, RN-BC [facility] PRIMARY CARE Navigator Cell: [phone]

## 2018-06-11 NOTE — Anesthesia Procedure Notes (Signed)
Arterial Line Insertion Start/End12/20/2019 12:15 PM, 06/11/2018 12:28 PM Performed by: Josephine Igo, CRNA, CRNA  Patient location: Pre-op. Preanesthetic checklist: patient identified, IV checked, risks and benefits discussed, monitors and equipment checked and pre-op evaluation Lidocaine 1% used for infiltration Right, radial was placed Catheter size: 20 G Hand hygiene performed , maximum sterile barriers used  and Seldinger technique used  Attempts: 1 Procedure performed without using ultrasound guided technique. Following insertion, dressing applied and Biopatch. Post procedure assessment: normal  Patient tolerated the procedure well with no immediate complications.

## 2018-06-11 NOTE — Consult Note (Signed)
Chief Complaint: Operative bleeding at lumbar spine  Referring Physician(s): Dr. Melina Schools  Supervising Physician: Corrie Mckusick  Patient Status: [facility]  History of Present Illness: Brian [name] is a 72 y.o. male presenting to Neuro-interventional team as an urgent consult to evaluate candidacy for embolization of possible venous vs arterial operative bleeding.   Brian [name] is known to have metastatic disease from bronchogenic carcinoma, and was admitted to [facility] hospital 12/13 with low back pain and new nerve symptoms.  Work-up reveals lung mass, liver and chest wall lesions, and MSK lesions including an L4 body met with canal encroachment.    NIR was called for input by Dr. Rolena Infante during surgery when he recognized some blood loss at the corpectomy site today.  We agreed a CTA for evaluation would be appropriate once the surgery site was closed.    CTA completed shows small hematoma at the right aspect of the corpectomy of L4, venous versus arterial.    I met the patient in his ICU bed, and he remains intubated and sedated.  I did discuss his history and our potential role with his family, including his wife.    At the time of my evaluation, his blood pressure on art line is 115-120systolic, and HR is 37-34.  He is not on pressors.    Past Medical History:  Diagnosis Date  . Arthritis     Past Surgical History:  Procedure Laterality Date  . CATARACT EXTRACTION, BILATERAL      Allergies: Minoxidil; Nicotine; and Sulfamethoxazole  Medications: Prior to Admission medications   Medication Sig Start Date End Date Taking? Authorizing Provider  aspirin 81 MG chewable tablet Chew 81 mg by mouth daily.   Yes [provider]  Calcium Carb-Cholecalciferol (CALCIUM 600/VITAMIN D3) 600-800 MG-UNIT TABS Take 1 tablet by mouth daily.   Yes [provider]  docusate sodium (COLACE) 100 MG capsule Take 100 mg by mouth 2 (two) times daily as needed for mild  constipation.   Yes [provider]  folic acid (FOLVITE) 1 MG tablet Take 1 mg by mouth daily.   Yes [provider]  Homeopathic Products (T-RELIEF PAIN RELIEF) CREA Apply 1 application topically 2 (two) times daily. Hip/Back pain   Yes [provider]  HYDROmorphone (DILAUDID) 4 MG tablet Take 4 mg by mouth every 6 (six) hours as needed for severe pain.   Yes [provider]  hydroxypropyl methylcellulose / hypromellose (ISOPTO TEARS / GONIOVISC) 2.5 % ophthalmic solution Place 1-2 drops into both eyes as needed for dry eyes.   Yes [provider]  ibuprofen (ADVIL,MOTRIN) 200 MG tablet Take 400 mg by mouth every 6 (six) hours as needed for headache or mild pain.   Yes [provider]  Lysine 500 MG CAPS Take 500 mg by mouth daily as needed (chapped lips).   Yes [provider]  Omega-3 350 MG CPDR Take 350 mg by mouth daily.   Yes [provider]  Potassium 99 MG TABS Take 99 mg by mouth daily as needed (leg cramps).   Yes [provider]  pseudoephedrine (SUDAFED) 120 MG 12 hr tablet Take 120 mg by mouth every 12 (twelve) hours as needed for congestion.   Yes [provider]     History reviewed. No pertinent family history.  Social History   Socioeconomic History  . Marital status: Married    Spouse name: Not on file  . Number of children: Not on file  .  Years of education: Not on file  . Highest education level: Not on file  Occupational History  . Not on file  Social Needs  . Financial resource strain: Not on file  . Food insecurity:    Worry: Not on file    Inability: Not on file  . Transportation needs:    Medical: Not on file    Non-medical: Not on file  Tobacco Use  . Smoking status: Current Every Day Smoker    Packs/day: 0.25    Types: Cigarettes  . Smokeless tobacco: Never Used  Substance and Sexual Activity  . Alcohol use: Not on file  . Drug use: Never  . Sexual activity:  Not on file  Lifestyle  . Physical activity:    Days per week: Not on file    Minutes per session: Not on file  . Stress: Not on file  Relationships  . Social connections:    Talks on phone: Not on file    Gets together: Not on file    Attends religious service: Not on file    Active member of club or organization: Not on file    Attends meetings of clubs or organizations: Not on file    Relationship status: Not on file  Other Topics Concern  . Not on file  Social History Narrative  . Not on file       Review of Systems: A 12 point ROS discussed and pertinent positives are indicated in the HPI above.  All other systems are negative.  Review of Systems  Vital Signs: BP 105/79   Pulse 88   Temp (!) 97.4 F (36.3 C) (Axillary) Comment: warm blanket  Resp 18   Ht _0  (1.778 m)   Wt 92.1 kg   SpO2 100%   BMI 29.13 kg/m   Physical Exam General: 72 yo male intubated in ICU.    Well-developed, well-nourished . HEENT: Atraumatic, normocephalic.  Intubated.  Neck: Symmetric with no goiter enlargement.  Chest/Lungs:  Symmetric chest with respirator.    Heart:  RRR,  No JVD appreciated.  Abdomen:  Soft, NT/ND  Genito-urinary: Deferred Neurologic: sedated.    .  Pulse Exam:  Palpable right and left CFA's.   Extremities: SCD's in place Imaging: Dg Lumbar Spine 2-3 Views  Result Date: 06/11/2018 CLINICAL DATA:  L4 corpectomy EXAM: LUMBAR SPINE - 2-3 VIEW; DG C-ARM 61-120 MIN COMPARISON:  MRI 06/04/2018 FINDINGS: Five low resolution intraoperative spot views of the lumbar spine. Total fluoroscopy time was 3 minutes 19 seconds. Images were obtained during L4 corpectomy and placement of interbody cage. IMPRESSION: Intraoperative fluoroscopic assistance provided during lumbar spine surgery Electronically Signed   By: Donavan Foil M.D.   On: 06/11/2018 19:07   Ct Chest W Contrast  Result Date: 06/04/2018 CLINICAL DATA:  CLINICAL DATA Patient with a pathologic fracture in L4  on postmyelogram CT scan 06/02/2018. Evaluate for primary and metastatic disease. EXAM: CT CHEST, ABDOMEN, AND PELVIS WITH CONTRAST TECHNIQUE: Multidetector CT imaging of the chest, abdomen and pelvis was performed following the standard protocol during bolus administration of intravenous contrast. CONTRAST:  100 mL OMNIPAQUE IOHEXOL 300 MG/ML  SOLN COMPARISON:  Postmyelogram lumbar spine CT scan 06/02/2018. FINDINGS: CT CHEST FINDINGS Cardiovascular: Heart size is normal. There is calcific aortic and coronary atherosclerosis. No pericardial effusion. Mediastinum/Nodes: Right hilar lymph node on image 37 measures 1.5 cm short axis dimension. Small node anterior to the right mainstem bronchus measuring 1 cm on image 33 also noted. A  few smaller mediastinal lymph nodes are noted. No axillary or supraclavicular lymphadenopathy. Lungs/Pleura: The lungs demonstrate extensive centrilobular emphysematous disease. There is a mass in the right lower lobe which measures 7.3 cm craniocaudal on image 72 of series 7 by 8.1 cm transverse by 5.1 cm AP on image 108 of series 4. Subpleural nodule in the right middle lobe on image 120 measures 0.5 cm Musculoskeletal: A destructive lesion in the medial diaphysis of the left clavicle measures 3 cm long image 20 of series 4 and is approximately 1.4 cm medial to the clavicular head. A destructive lesion in the lateral arc of the right eighth rib measures 7 cm long by 3.1 cm transverse by 4.2 cm craniocaudal. Subtle lucent lesion lateral arc of the left seventh rib on image 105 of series 4 is also noted. No other focal bony abnormality is identified. A subcutaneous low attenuating lesion in the anterior left chest wall measures 1.8 cm AP x 2.6 cm transverse x 2.5 cm craniocaudal. CT ABDOMEN PELVIS FINDINGS Hepatobiliary: A 2.5 cm in diameter hypoattenuating lesion is seen in the left hepatic lobe near the dome on image 54. A punctate hypoattenuating lesion in the right hepatic lobe on  image 70 is also identified. The liver is otherwise unremarkable. A few stones are seen in the gallbladder but there is no evidence of cholecystitis. Biliary tree appears normal. Pancreas: Unremarkable. No pancreatic ductal dilatation or surrounding inflammatory changes. Spleen: Normal in size without focal abnormality. Adrenals/Urinary Tract: Mild thickening of the left adrenal gland is most suggestive of hyperplasia. The right adrenal gland is unremarkable. Stomach/Bowel: Stomach is within normal limits. No evidence of appendicitis. No evidence of bowel wall thickening, distention, or inflammatory changes. Vascular/Lymphatic: The patient has extensive calcific atherosclerosis. Mild aneurysmal dilatation of the descending abdominal aorta at 3.3 cm is identified. No lymphadenopathy. Reproductive: The prostate gland is mildly enlarged. Other: Small fat containing left inguinal hernia is noted. Musculoskeletal: Pathologic fracture of L4 is identified as seen on the comparison CT. No other bony abnormality is identified. Simple lipoma in the right gluteal musculature incidentally noted. IMPRESSION: Right lower lobe mass lesion most consistent with bronchogenic carcinoma. Destructive lesions in the left clavicle, right seventh ribs and L4 vertebral body are consistent with metastatic disease. Small mediastinal lymph nodes measuring up to 1.5 cm are identified may also be pathologic. Two lesions in the liver are identified and worrisome for metastatic disease. Advanced emphysema. Calcific aortic and coronary atherosclerosis. 3.3 cm abdominal aortic aneurysm is identified. Recommend followup by ultrasound in 3 years. This recommendation follows ACR consensus guidelines: White Paper of the ACR Incidental Findings Committee II on Vascular Findings. J Am Coll Radiol 2013; 10:789-794 Gallstones without evidence of cholecystitis. Cystic lesion in the subcutaneous tissues of the anterior left chest wall is likely a sebaceous  cyst. Simple lipoma right gluteal musculature. Electronically Signed   By: Inge Rise M.D.   On: 06/04/2018 19:31   Ct Lumbar Spine W Contrast  Result Date: 06/02/2018 CLINICAL DATA:  Low back and bilateral leg pain. EXAM: LUMBAR MYELOGRAM FLUOROSCOPY TIME:  0 minutes 38 seconds. 142.58 micro gray meter squared PROCEDURE: After thorough discussion of risks and benefits of the procedure including bleeding, infection, injury to nerves, blood vessels, adjacent structures as well as headache and CSF leak, written and oral informed consent was obtained. Consent was obtained by Dr. [name]. Time out form was completed. Patient was positioned prone on the fluoroscopy table. Local anesthesia was provided with 1% lidocaine without  epinephrine after prepped and draped in the usual sterile fashion. Puncture was performed at L4-5 using a 3 1/2 inch 22-gauge spinal needle via right paramedian approach. Using a single pass through the dura, the needle was placed within the thecal sac, with return of clear CSF. 15 mL of Isovue M-200 was injected into the thecal sac, with normal opacification of the nerve roots and cauda equina consistent with free flow within the subarachnoid space. I personally performed the lumbar puncture and administered the intrathecal contrast. I also personally performed acquisition of the myelogram images. TECHNIQUE: Contiguous axial images were obtained through the Lumbar spine after the intrathecal infusion of infusion. Coronal and sagittal reconstructions were obtained of the axial image sets. COMPARISON:  Radiography 04/13/2018 FINDINGS: LUMBAR MYELOGRAM FINDINGS: There is no compressive central canal stenosis. There is an anterior extradural defect at the L4 level with an abnormal appearance of the posterior L4 vertebral body. There appears to be lytic change of the bone, new since the radiography of October. Extradural defect is present with some canal narrowing and lateral recess  encroachment right more than left. L5-S1 shows disc space narrowing. Standing flexion extension views show perhaps lighted crease in prominence of the extradural defect at L4. No antero or retrolisthesis is seen. CT LUMBAR MYELOGRAM FINDINGS: No significant abnormality is seen at L2-3 or above. The vertebral bodies appear normal. The disc spaces are unremarkable. No compressive stenosis. At L4, there is a pathologic compression fracture with lytic destruction. There is loss of height posteriorly of 40%. Extraosseous tumor encroaches upon the spinal canal and both of the L4-5 intervertebral foramina, right more than left. Extraosseous tumor extends from the superior endplate of L4 down as far as the L4-5 disc. This is consistent with either metastatic carcinoma or myeloma. At L5-S1, there is chronic disc degeneration with vacuum phenomenon and bulging of the disc. There is bilateral facet degeneration. No central canal stenosis. There is bilateral foraminal narrowing right more than left. As shown at radiography, there is an infrarenal abdominal aortic aneurysm with transverse diameter maximal 3.8 cm. There are calcified gallstones within the gallbladder. No other regional finding. IMPRESSION: Pathologic fracture at L4 with maximal loss of height posteriorly of 40%. Lytic destruction of the L4 vertebral body. Extraosseous tumor encroaching upon the spinal canal at the L4 level, worse on the right than the left, with some foraminal encroachment worse on the right than the left at L4-5. This could be metastatic carcinoma or myeloma. Chronic degenerative disc disease at L5-S1 but without significant stenosis. Infrarenal abdominal aortic aneurysm with maximal transverse diameter of 3.8 cm. Recommend followup by ultrasound in 2 years. This recommendation follows ACR consensus guidelines: White Paper of the ACR Incidental Findings Committee II on Vascular Findings. J Am Coll Radiol 2013; 10:789-794. Chololithiasis. These  results will be called to the ordering clinician or representative by the Radiologist Assistant, and communication documented in the PACS or zVision Dashboard. Electronically Signed   By: [name] M.D.   On: 06/02/2018 11:59   Brian [name] EL Contrast  Result Date: 06/07/2018 CLINICAL DATA:  Staging small cell lung cancer. EXAM: MRI HEAD WITHOUT AND WITH CONTRAST TECHNIQUE: Multiplanar, multiecho pulse sequences of the brain and surrounding structures were obtained without and with intravenous contrast. CONTRAST:  9 cc Gadavist. COMPARISON:  None. FINDINGS: Brain: Diffusion imaging does not show any acute or subacute infarction. There are a few old small vessel insults within the pons and cerebellum. Cerebral hemispheres show moderate chronic small-vessel ischemic  changes of the deep and subcortical white matter without large vessel territory infarction. No evidence of hemorrhage, hydrocephalus or extra-axial collection. After contrast administration, there is no abnormal enhancement to suggest primary or metastatic mass lesion. Vascular: Major vessels at the base of the brain show flow. Skull and upper cervical spine: Negative Sinuses/Orbits: Clear/normal Other: None IMPRESSION: No evidence of metastatic disease. Moderate chronic small-vessel ischemic changes affecting the brain as outlined above. Electronically Signed   By: [name] M.D.   On: 06/07/2018 14:45   Brian Lumbar Spine W Wo Contrast  Result Date: 06/04/2018 CLINICAL DATA:  Metastatic disease.  Lung cancer. EXAM: MRI LUMBAR SPINE WITHOUT AND WITH CONTRAST TECHNIQUE: Multiplanar and multiecho pulse sequences of the lumbar spine were obtained without and with intravenous contrast. CONTRAST:  7.5 mL Gadavist COMPARISON:  CT lumbar spine 06/02/2018 FINDINGS: Segmentation: Normal. The lowest disc space is considered to be L5-S1. Alignment:  Normal Vertebrae: There is loss of normal signal throughout the L4 vertebral body, where there is a known  pathologic fracture with approximately 40% height loss. There is posterior convex bulging with multifocal contrast-enhancement. There is thickening of the overlying ventral dura with associated increased contrast enhancement. This results in severe spinal canal stenosis at this level. There are no other focal osseous lesions. Conus medullaris and cauda equina: The conus medullaris terminates at the L1 level. The cauda equina and conus medullaris are both normal. Paraspinal and other soft tissues: The visualized aorta, IVC and iliac vessels are normal. The visualized retroperitoneal organs and paraspinal soft tissues are normal. Disc levels: Sagittal plane imaging includes the T11-12 disc level through the upper sacrum, with axial imaging of the T12-L1 to L5-S1 disc levels. There is disc desiccation at the T12-L1, L1-L2 and L2-L3 levels without spinal canal stenosis or neural impingement. L3-4: Small disc bulge without associated stenosis. At the L4 level, there is severe spinal canal stenosis due to posterior bulging of the L4 vertebral body. L4-5: Minimal disc bulge. There is severe right neural foraminal stenosis due to the L4 mass. Mild left foraminal stenosis. L5-S1: Disc desiccation and mild endplate spurring. Moderate right neural foraminal stenosis. The visualized portion of the sacrum is normal. IMPRESSION: 1. Metastatic lesion of the L4 vertebral body with associated pathologic fracture and posterior bulging of the vertebral body. This, in combination with overlying epidural thickening, causes severe spinal canal stenosis and severe right neural foraminal stenosis. 2. Moderate right L5-S1 neural foraminal stenosis. Electronically Signed   By: Ulyses Jarred M.D.   On: 06/04/2018 22:20   Nm Bone Scan Whole Body  Result Date: 06/07/2018 CLINICAL DATA:  Metastatic spine tumor. EXAM: NUCLEAR MEDICINE WHOLE BODY BONE SCAN TECHNIQUE: Whole body anterior and posterior images were obtained approximately 3 hours  after intravenous injection of radiopharmaceutical. RADIOPHARMACEUTICALS:  20.2 mCi Technetium-57mMDP IV COMPARISON:  MRI of June 04, 2018. CT scan of June 04, 2018. FINDINGS: Abnormal uptake is seen in left knee consistent with degenerative change. Abnormal uptake is seen at L4 level consistent with metastatic lesion seen on prior MRI. Focus of abnormal uptake is seen involving the posterior portion of a right rib which is consistent with metastatic disease. Abnormal uptake is seen involving the anterior portions of the left first rib and left clavicle consistent with metastatic disease as described on prior CT scan. IMPRESSION: Abnormal uptake is seen in midportion of right rib, L4 vertebral body and anterior portions of left first rib and left clavicle consistent with metastatic disease. This correlates with  findings seen on prior CT scan. Electronically Signed   By: Marijo Conception, M.D.   On: 06/07/2018 13:43   Ct Abdomen Pelvis W Contrast  Result Date: 06/04/2018 CLINICAL DATA:  CLINICAL DATA Patient with a pathologic fracture in L4 on postmyelogram CT scan 06/02/2018. Evaluate for primary and metastatic disease. EXAM: CT CHEST, ABDOMEN, AND PELVIS WITH CONTRAST TECHNIQUE: Multidetector CT imaging of the chest, abdomen and pelvis was performed following the standard protocol during bolus administration of intravenous contrast. CONTRAST:  100 mL OMNIPAQUE IOHEXOL 300 MG/ML  SOLN COMPARISON:  Postmyelogram lumbar spine CT scan 06/02/2018. FINDINGS: CT CHEST FINDINGS Cardiovascular: Heart size is normal. There is calcific aortic and coronary atherosclerosis. No pericardial effusion. Mediastinum/Nodes: Right hilar lymph node on image 37 measures 1.5 cm short axis dimension. Small node anterior to the right mainstem bronchus measuring 1 cm on image 33 also noted. A few smaller mediastinal lymph nodes are noted. No axillary or supraclavicular lymphadenopathy. Lungs/Pleura: The lungs demonstrate  extensive centrilobular emphysematous disease. There is a mass in the right lower lobe which measures 7.3 cm craniocaudal on image 72 of series 7 by 8.1 cm transverse by 5.1 cm AP on image 108 of series 4. Subpleural nodule in the right middle lobe on image 120 measures 0.5 cm Musculoskeletal: A destructive lesion in the medial diaphysis of the left clavicle measures 3 cm long image 20 of series 4 and is approximately 1.4 cm medial to the clavicular head. A destructive lesion in the lateral arc of the right eighth rib measures 7 cm long by 3.1 cm transverse by 4.2 cm craniocaudal. Subtle lucent lesion lateral arc of the left seventh rib on image 105 of series 4 is also noted. No other focal bony abnormality is identified. A subcutaneous low attenuating lesion in the anterior left chest wall measures 1.8 cm AP x 2.6 cm transverse x 2.5 cm craniocaudal. CT ABDOMEN PELVIS FINDINGS Hepatobiliary: A 2.5 cm in diameter hypoattenuating lesion is seen in the left hepatic lobe near the dome on image 54. A punctate hypoattenuating lesion in the right hepatic lobe on image 70 is also identified. The liver is otherwise unremarkable. A few stones are seen in the gallbladder but there is no evidence of cholecystitis. Biliary tree appears normal. Pancreas: Unremarkable. No pancreatic ductal dilatation or surrounding inflammatory changes. Spleen: Normal in size without focal abnormality. Adrenals/Urinary Tract: Mild thickening of the left adrenal gland is most suggestive of hyperplasia. The right adrenal gland is unremarkable. Stomach/Bowel: Stomach is within normal limits. No evidence of appendicitis. No evidence of bowel wall thickening, distention, or inflammatory changes. Vascular/Lymphatic: The patient has extensive calcific atherosclerosis. Mild aneurysmal dilatation of the descending abdominal aorta at 3.3 cm is identified. No lymphadenopathy. Reproductive: The prostate gland is mildly enlarged. Other: Small fat containing  left inguinal hernia is noted. Musculoskeletal: Pathologic fracture of L4 is identified as seen on the comparison CT. No other bony abnormality is identified. Simple lipoma in the right gluteal musculature incidentally noted. IMPRESSION: Right lower lobe mass lesion most consistent with bronchogenic carcinoma. Destructive lesions in the left clavicle, right seventh ribs and L4 vertebral body are consistent with metastatic disease. Small mediastinal lymph nodes measuring up to 1.5 cm are identified may also be pathologic. Two lesions in the liver are identified and worrisome for metastatic disease. Advanced emphysema. Calcific aortic and coronary atherosclerosis. 3.3 cm abdominal aortic aneurysm is identified. Recommend followup by ultrasound in 3 years. This recommendation follows ACR consensus guidelines: White Paper of the  ACR Incidental Findings Committee II on Vascular Findings. J Am Coll Radiol 2013; 10:789-794 Gallstones without evidence of cholecystitis. Cystic lesion in the subcutaneous tissues of the anterior left chest wall is likely a sebaceous cyst. Simple lipoma right gluteal musculature. Electronically Signed   By: Inge Rise M.D.   On: 06/04/2018 19:31   Dg Chest Port 1 View  Result Date: 06/11/2018 CLINICAL DATA:  Intubated. EXAM: PORTABLE CHEST 1 VIEW COMPARISON:  Chest CT 06/04/2018. FINDINGS: 2036 hours. Endotracheal tube tip is 8.2 cm above the base of the carina. Heart size upper normal. Interstitial markings are diffusely coarsened with chronic features. Known right lower lobe mass projects over the right hilum. There is right base atelectasis or infiltrate. IMPRESSION: 1. Endotracheal tube tip is 8.2 cm above the base the carina. 2. Known right lower lobe mass projects over the right hilum. 3. Chronic interstitial lung disease. Atelectasis or infiltrate noted at the right base. Electronically Signed   By: Misty Stanley M.D.   On: 06/11/2018 20:50   Dg C-arm 1-60 Min  Result  Date: 06/11/2018 CLINICAL DATA:  L4 corpectomy EXAM: LUMBAR SPINE - 2-3 VIEW; DG C-ARM 61-120 MIN COMPARISON:  MRI 06/04/2018 FINDINGS: Five low resolution intraoperative spot views of the lumbar spine. Total fluoroscopy time was 3 minutes 19 seconds. Images were obtained during L4 corpectomy and placement of interbody cage. IMPRESSION: Intraoperative fluoroscopic assistance provided during lumbar spine surgery Electronically Signed   By: Donavan Foil M.D.   On: 06/11/2018 19:07   Dg Myelography Lumbar Inj Lumbosacral  Result Date: 06/02/2018 CLINICAL DATA:  Low back and bilateral leg pain. EXAM: LUMBAR MYELOGRAM FLUOROSCOPY TIME:  0 minutes 38 seconds. 142.58 micro gray meter squared PROCEDURE: After thorough discussion of risks and benefits of the procedure including bleeding, infection, injury to nerves, blood vessels, adjacent structures as well as headache and CSF leak, written and oral informed consent was obtained. Consent was obtained by Dr. [name]. Time out form was completed. Patient was positioned prone on the fluoroscopy table. Local anesthesia was provided with 1% lidocaine without epinephrine after prepped and draped in the usual sterile fashion. Puncture was performed at L4-5 using a 3 1/2 inch 22-gauge spinal needle via right paramedian approach. Using a single pass through the dura, the needle was placed within the thecal sac, with return of clear CSF. 15 mL of Isovue M-200 was injected into the thecal sac, with normal opacification of the nerve roots and cauda equina consistent with free flow within the subarachnoid space. I personally performed the lumbar puncture and administered the intrathecal contrast. I also personally performed acquisition of the myelogram images. TECHNIQUE: Contiguous axial images were obtained through the Lumbar spine after the intrathecal infusion of infusion. Coronal and sagittal reconstructions were obtained of the axial image sets. COMPARISON:  Radiography  04/13/2018 FINDINGS: LUMBAR MYELOGRAM FINDINGS: There is no compressive central canal stenosis. There is an anterior extradural defect at the L4 level with an abnormal appearance of the posterior L4 vertebral body. There appears to be lytic change of the bone, new since the radiography of October. Extradural defect is present with some canal narrowing and lateral recess encroachment right more than left. L5-S1 shows disc space narrowing. Standing flexion extension views show perhaps lighted crease in prominence of the extradural defect at L4. No antero or retrolisthesis is seen. CT LUMBAR MYELOGRAM FINDINGS: No significant abnormality is seen at L2-3 or above. The vertebral bodies appear normal. The disc spaces are unremarkable. No compressive stenosis. At  L4, there is a pathologic compression fracture with lytic destruction. There is loss of height posteriorly of 40%. Extraosseous tumor encroaches upon the spinal canal and both of the L4-5 intervertebral foramina, right more than left. Extraosseous tumor extends from the superior endplate of L4 down as far as the L4-5 disc. This is consistent with either metastatic carcinoma or myeloma. At L5-S1, there is chronic disc degeneration with vacuum phenomenon and bulging of the disc. There is bilateral facet degeneration. No central canal stenosis. There is bilateral foraminal narrowing right more than left. As shown at radiography, there is an infrarenal abdominal aortic aneurysm with transverse diameter maximal 3.8 cm. There are calcified gallstones within the gallbladder. No other regional finding. IMPRESSION: Pathologic fracture at L4 with maximal loss of height posteriorly of 40%. Lytic destruction of the L4 vertebral body. Extraosseous tumor encroaching upon the spinal canal at the L4 level, worse on the right than the left, with some foraminal encroachment worse on the right than the left at L4-5. This could be metastatic carcinoma or myeloma. Chronic degenerative  disc disease at L5-S1 but without significant stenosis. Infrarenal abdominal aortic aneurysm with maximal transverse diameter of 3.8 cm. Recommend followup by ultrasound in 2 years. This recommendation follows ACR consensus guidelines: White Paper of the ACR Incidental Findings Committee II on Vascular Findings. J Am Coll Radiol 2013; 10:789-794. Chololithiasis. These results will be called to the ordering clinician or representative by the Radiologist Assistant, and communication documented in the PACS or zVision Dashboard. Electronically Signed   By: [name] M.D.   On: 06/02/2018 11:59    Labs:  CBC: Recent Labs    06/04/18 1417 06/05/18 0634  06/11/18 1550 06/11/18 1711 06/11/18 1801 06/11/18 1810  WBC 12.8* 10.7*  --   --   --   --   --   HGB 13.4 11.6*   < > 9.9* 8.5* 9.5* 9.2*  HCT 40.6 35.4*   < > 29.0* 25.0* 28.0* 27.0*  PLT 287 255  --   --   --   --   --    < > = values in this interval not displayed.    COAGS: Recent Labs    06/04/18 1417  INR 0.99  APTT 30    BMP: Recent Labs    06/04/18 1417 06/05/18 0634  06/11/18 1550 06/11/18 1711 06/11/18 1801 06/11/18 1810  NA 136 139   < > 142 141 141 140  K 3.1* 4.3   < > 2.8* 3.5 3.9 3.8  CL 101 103  --   --   --   --   --   CO2 24 27  --   --   --   --   --   GLUCOSE 104* 101*  --   --   --  95  --   BUN 13 10  --   --   --   --   --   CALCIUM 9.2 8.7*  --   --   --   --   --   CREATININE 0.84 0.77  --   --   --   --   --   GFRNONAA >60 >60  --   --   --   --   --   GFRAA >60 >60  --   --   --   --   --    < > = values in this interval not displayed.    LIVER FUNCTION TESTS: Recent  Labs    06/04/18 1417 06/05/18 0634  BILITOT 1.2 0.9  AST 23 19  ALT 20 19  ALKPHOS 99 81  PROT 6.8 5.9*  ALBUMIN 3.4* 2.8*    TUMOR MARKERS: No results for input(s): AFPTM, CEA, CA199, CHROMGRNA in the last 8760 hours.  Assessment and Plan:  Brian [name] is a 72 year old male with history of metastatic disease,  including an L4 lesion with neural compromise, SP corpectomy today.    He has some intra-operative bleeding, for which NIR was called in the case of need to embolize.    I have discussed his case with Dr. Rolena Infante.  We agree that for now observation is reasonable, as the blood loss dose not seem out of the ordinary for corpectomy, he has normal vital signs, patient has no coagulopathy, and the site of the hematoma on CT is retroperitoneum which is an area that will typically tamponade.    I have also discussed the situation with his wife.  At this time, a formal consent for angio/embo has not been performed, as I let her know that there is low likelihood.  In the case of a change, she knows that we will need to call her for complete informed consent.    NIR to observe for now.  We are available with any changes.    Call with questions/concerns.   Electronically Signed: Corrie Mckusick, DO 06/11/2018, 10:00 PM   I spent a total of 15 Miinutes    in face to face in clinical consultation, greater than 50% of which was counseling/coordinating care for operative hematoma, possible angio and possible embolization.

## 2018-06-11 NOTE — Transfer of Care (Signed)
Immediate Anesthesia Transfer of Care Note  Patient: Brian [name]  Procedure(s) Performed: L4 Corpectomy, L3-5 anterior reconstruction, (Left Flank)  Patient Location: ICU  Anesthesia Type:General  Level of Consciousness: sedated and Patient remains intubated per anesthesia plan  Airway & Oxygen Therapy: Patient remains intubated per anesthesia plan and Patient placed on Ventilator (see vital sign flow sheet for setting)  Post-op Assessment: Report given to RN and Post -op Vital signs reviewed and stable  Post vital signs: Reviewed and stable  Last Vitals:  Vitals Value Taken Time  BP 168/107 06/11/2018  7:35 PM  Temp    Pulse 109 06/11/2018  7:43 PM  Resp 13 06/11/2018  7:43 PM  SpO2 100 % 06/11/2018  7:43 PM  Vitals shown include unvalidated device data.  Last Pain:  Vitals:   06/11/18 1100  TempSrc:   PainSc: 3       Patients Stated Pain Goal: 2 (01/75/10 2585)  Complications: No apparent anesthesia complications

## 2018-06-12 ENCOUNTER — Inpatient Hospital Stay (HOSPITAL_COMMUNITY): Payer: Medicare Other

## 2018-06-12 DIAGNOSIS — J9600 Acute respiratory failure, unspecified whether with hypoxia or hypercapnia: Secondary | ICD-10-CM

## 2018-06-12 LAB — APTT: aPTT: 26 seconds (ref 24–36)

## 2018-06-12 LAB — TRIGLYCERIDES: Triglycerides: 108 mg/dL (ref <150)

## 2018-06-12 LAB — PREPARE FRESH FROZEN PLASMA
Unit division: 0
Unit division: 0
Unit division: 0

## 2018-06-12 LAB — BPAM FFP
Blood Product Expiration Date: 201912202359
Blood Product Expiration Date: 201912202359
Blood Product Expiration Date: 201912202359
Blood Product Expiration Date: 201912202359
ISSUE DATE / TIME: 201912152049
ISSUE DATE / TIME: 201912201725
ISSUE DATE / TIME: 201912201725
Unit Type and Rh: 2800
Unit Type and Rh: 6200
Unit Type and Rh: 6200
Unit Type and Rh: 8400

## 2018-06-12 LAB — BASIC METABOLIC PANEL
Anion gap: 8 (ref 5–15)
Anion gap: 10 (ref 5–15)
BUN: 13 mg/dL (ref 8–23)
BUN: 12 mg/dL (ref 8–23)
CO2: 20 mmol/L — ABNORMAL LOW (ref 22–32)
CO2: 20 mmol/L — ABNORMAL LOW (ref 22–32)
Calcium: 7.6 mg/dL — ABNORMAL LOW (ref 8.9–10.3)
Calcium: 7.3 mg/dL — ABNORMAL LOW (ref 8.9–10.3)
Chloride: 110 mmol/L (ref 98–111)
Chloride: 107 mmol/L (ref 98–111)
Creatinine, Ser: 0.69 mg/dL (ref 0.61–1.24)
Creatinine, Ser: 0.82 mg/dL (ref 0.61–1.24)
GFR calc Af Amer: >60 mL/min (ref >60)
GFR calc Af Amer: >60 mL/min (ref >60)
GFR calc non Af Amer: >60 mL/min (ref >60)
GFR calc non Af Amer: >60 mL/min (ref >60)
Glucose, Bld: 121 mg/dL — ABNORMAL HIGH (ref 70–99)
Glucose, Bld: 107 mg/dL — ABNORMAL HIGH (ref 70–99)
Potassium: 4 mmol/L (ref 3.5–5.1)
Potassium: 3.5 mmol/L (ref 3.5–5.1)
SODIUM: 138 mmol/L (ref 135–145)
SODIUM: 137 mmol/L (ref 135–145)

## 2018-06-12 LAB — GLUCOSE, CAPILLARY
GLUCOSE-CAPILLARY: 100 mg/dL — AB (ref 70–99)
GLUCOSE-CAPILLARY: 94 mg/dL (ref 70–99)
Glucose-Capillary: 105 mg/dL — ABNORMAL HIGH (ref 70–99)
Glucose-Capillary: 94 mg/dL (ref 70–99)
Glucose-Capillary: 93 mg/dL (ref 70–99)
Glucose-Capillary: 103 mg/dL — ABNORMAL HIGH (ref 70–99)
Glucose-Capillary: 102 mg/dL — ABNORMAL HIGH (ref 70–99)

## 2018-06-12 LAB — CBC
HCT: 32.3 % — ABNORMAL LOW (ref 39.0–52.0)
HEMOGLOBIN: 10.8 g/dL — AB (ref 13.0–17.0)
MCH: 29.7 pg (ref 26.0–34.0)
MCHC: 33.4 g/dL (ref 30.0–36.0)
MCV: 88.7 fL (ref 80.0–100.0)
Platelets: 185 10*3/uL (ref 150–400)
RBC: 3.64 MIL/uL — ABNORMAL LOW (ref 4.22–5.81)
RDW: 15.3 % (ref 11.5–15.5)
WBC: 11.7 10*3/uL — ABNORMAL HIGH (ref 4.0–10.5)
nRBC: 0 % (ref 0.0–0.2)

## 2018-06-12 LAB — PROTIME-INR
INR: 1.16
Prothrombin Time: 14.7 seconds (ref 11.4–15.2)

## 2018-06-12 MED ORDER — SODIUM CHLORIDE 0.9 % IV SOLN
INTRAVENOUS | Status: DC | PRN
  Administered 2018-06-12: 500 mL via INTRAVENOUS

## 2018-06-12 MED ORDER — FENTANYL 2500MCG IN NS 250ML (10MCG/ML) PREMIX INFUSION
0.0000 ug/h | INTRAVENOUS | Status: DC
  Administered 2018-06-12: 25 ug/h via INTRAVENOUS
  Administered 2018-06-13: 250 ug/h via INTRAVENOUS
  Administered 2018-06-13 (×2): 125 ug/h via INTRAVENOUS
  Filled 2018-06-12 (×3): qty 250

## 2018-06-12 MED ORDER — PROPOFOL 1000 MG/100ML IV EMUL
0.0000 ug/kg/min | INTRAVENOUS | Status: DC
  Administered 2018-06-12: 10 ug/kg/min via INTRAVENOUS
  Administered 2018-06-12: 20 ug/kg/min via INTRAVENOUS
  Administered 2018-06-12 (×2): 5 ug/kg/min via INTRAVENOUS
  Administered 2018-06-13: 10 ug/kg/min via INTRAVENOUS
  Filled 2018-06-12 (×4): qty 100

## 2018-06-12 MED ORDER — SODIUM CHLORIDE 0.9 % IV SOLN: 250.0000 mL | INTRAVENOUS | Status: DC

## 2018-06-12 MED ORDER — FAMOTIDINE 40 MG/5ML PO SUSR
20.0000 mg | Freq: Two times a day (BID) | ORAL | Status: DC
  Administered 2018-06-12 – 2018-06-14 (×3): 20 mg
  Filled 2018-06-12 (×3): qty 2.5

## 2018-06-12 MED ORDER — CHLORHEXIDINE GLUCONATE 0.12 % MT SOLN
OROMUCOSAL | Status: AC
  Filled 2018-06-12: qty 15

## 2018-06-12 NOTE — Plan of Care (Signed)
  Problem: Pain Managment: Goal: General experience of comfort will improve Outcome: Progressing   

## 2018-06-12 NOTE — Progress Notes (Addendum)
NAME:  Brian [name], MRN:  [id number], DOB:  01-10-1946, LOS: 26 ADMISSION DATE:  06/04/2018, CONSULTATION DATE:  06/11/2018 REFERRING MD:  Dr. Rolena Infante, CHIEF COMPLAINT:   Ventilator management  History of present illness   72 year old man with history of RA, emphysema, tobacco use admitted on 06/04/2018 for lower back pain found to have pathologic L4 fracture and CT chest with right lower lobe mass concerning for bronchogenic carcinoma with metastasis to bone. He underwent L4 Corpectomy, L3-5 anterior reconstruction with ortho on 06/11/2018. During the procedure he received 4 units of pRBC and 2 units of FFP.   Past Medical History  Rheumatoid arthritis, emphysema, tobacco use   Significant Hospital Events   12/13 admission 12/20 L4 Corpectomy, L3-5 anterior reconstruction 06/11/2018 intubated during surgery  Procedures:  12/20 L4 Corpectomy, L3-5 anterior reconstruction 06/11/2018 intubation>> 06/11/2018 right radial A-line>> 06/11/2018 Hemovac drains lumbar area>>  Significant Diagnostic Tests:  CTA abd/pelvis on 12/20 retroperitoneal hematoma seen  CXR on 12/20: Endotracheal tube tip is 8.2 cm above the base the carina. Known right lower lobe mass projects over the right hilum. Chronic interstitial lung disease. Atelectasis or infiltrate at the right base.  CT chest/abd/pelvis w/ contrast on 12/13: Right lower lobe mass lesion. Destructive lesions in the left clavicle, right seventh ribs and L4 vertebral body. Small mediastinal lymph nodes measuring up to 1.5 cm. Two lesions in the liver. Advanced emphysema. Calcific aortic and coronary atherosclerosis. 3.3 cm abdominal aortic aneurysm is identified. Gallstones without evidence of cholecystitis. Cystic lesion in the subcutaneous tissues of the anterior left chest wall. Simple lipoma right gluteal musculature.  MR brain w/ w/o contrast on 12/16 No evidence of metastatic disease. Moderate chronic small-vessel ischemic changes    NM bone scan 12/16 Abnormal uptake is seen in midportion of right rib, L4 vertebral body and anterior portions of left first rib and left clavicle  MR lumbar spine 12/13 Metastatic lesion of the L4 vertebral body with associated pathologic fracture and posterior bulging of the vertebral body, overlying epidural thickening, severe spinal canal stenosis and severe right neural foraminal stenosis. Moderate right L5-S1 neural foraminal stenosis  EKG 12/16 with sinus bradycardia, no previous for comparison  Micro Data:  MRSA screen neg on 12/18  Antimicrobials:  Vanc x 1 on 12/20 06/11/2018 vancomycin>> Interim history/subjective:  06/12/2018 remains sedated and intubated  Objective   Blood pressure 119/85, pulse 99, temperature 98.4 F (36.9 C), temperature source Axillary, resp. rate (!) 21, height 5\' 10"  (1.778 m), weight 93 kg, SpO2 100 %.    Vent Mode: PRVC FiO2 (%):  [40 %-80 %] 40 % Set Rate:  [15 bmp] 15 bmp Vt Set:  [580 mL] 580 mL PEEP:  [5 cmH20] 5 cmH20 Pressure Support:  [5 cmH20] 5 cmH20 Plateau Pressure:  [12 cmH20-16 cmH20] 16 cmH20   Intake/Output Summary (Last 24 hours) at 06/12/2018 1311 Last data filed at 06/12/2018 1300 Gross per 24 hour  Intake 7620.29 ml  Output 3745 ml  Net 3875.29 ml   Filed Weights   06/09/18 1107 06/11/18 2000 06/12/18 0500  Weight: 89.9 kg 92.1 kg 93 kg    Examination: General: Male currently sedated on mechanical ventilatory support HEENT: Endotracheal tube in place. Neuro: Although sedated follows commands moves all extremities opens eyes CV: Sounds are regular PULM: even/non-labored, lungs bilaterally creased in the bases HE:NIDP, non-tender, bsx4 active  Extremities: warm/dry, negative edema  Skin: no rashes or lesions   Assessment & Plan:  72 year old man with history  of RA, emphysema, tobacco use admitted on 06/04/2018 here with pathologic L4 fracture and right lower lobe mass concerning for bronchogenic carcinoma  with metastasis to bone now s/p L4 Corpectomy, L3-5 anterior reconstruction with ortho on 06/11/2018  Acute hypercarbic respiratory failure: Right lung mass questionable bronchogenic carcinoma with metastasis to the bone.  ABG 7.2/58.5/261 at 1800. Currently on PRVC with FiO2 of 80% and PEEP of 5, rate of 15 and TV of 580.  -Continue full vent support with planning going back to the OR on 06/13/2018 -Wean O2 per protocol -Serial chest x-rays and ABGs   Acute blood loss anemia: Received 4 units of pRBC and 2 units of FFP in OR 12/20. Hgb 9.5 at 1800.  Recent Labs    06/11/18 2326 06/12/18 0456  HGB 11.2* 10.8*    -Follow CBC -Interventional radiology is following for questionable embolization if bleeding recurred.  Suspected bone mets  L4 fracture: s/p L4 Corpectomy, L3-5 anterior reconstruction with ortho on 06/11/2018 -per ortho  RA, chronic pain: On dilaudid PO at home. Was previously on methotrexate. Had 3 weeklong pred tapers prior to hospitalization. -Holding home Dilaudid while on IV sedation -At the trach site on hold  Emphysema, possible COPD:  -C continue bronchodilators  Abdominal aortic aneurysm: -Patient follow-up   Best practice:  Diet: NPO Pain/Anxiety/Delirium protocol (if indicated): fent prn  VAP protocol (if indicated): Peridex BID and rest of protocol ordered DVT prophylaxis: SCDs GI prophylaxis: pepcid Glucose control: normoglycemic Mobility: PT with precautions per ortho Code Status: FULL Family Communication: 06/12/2018 wife updated at bedside Disposition: Continue in ICU  Labs   CBC: Recent Labs  Lab 06/11/18 1711 06/11/18 1801 06/11/18 1810 06/11/18 2326 06/12/18 0456  WBC  --   --   --  13.7* 11.7*  HGB 8.5* 9.5* 9.2* 11.2* 10.8*  HCT 25.0* 28.0* 27.0* 33.5* 32.3*  MCV  --   --   --  89.1 88.7  PLT  --   --   --  181 973    Basic Metabolic Panel: Recent Labs  Lab 06/11/18 1711 06/11/18 1801 06/11/18 1810 06/11/18 2326  06/12/18 0456  NA 141 141 140 138 137  K 3.5 3.9 3.8 4.0 3.5  CL  --   --   --  110 107  CO2  --   --   --  20* 20*  GLUCOSE  --  95  --  121* 107*  BUN  --   --   --  13 12  CREATININE  --   --   --  0.69 0.82  CALCIUM  --   --   --  7.6* 7.3*   GFR: Estimated Creatinine Clearance: 93.3 mL/min (by C-G formula based on SCr of 0.82 mg/dL). Recent Labs  Lab 06/11/18 2326 06/12/18 0456  WBC 13.7* 11.7*    Liver Function Tests: No results for input(s): AST, ALT, ALKPHOS, BILITOT, PROT, ALBUMIN in the last 168 hours.  ABG    Component Value Date/Time   PHART 7.381 06/11/2018 2123   PCO2ART 35.7 06/11/2018 2123   PO2ART 222.0 (H) 06/11/2018 2123   HCO3 21.2 06/11/2018 2123   TCO2 22 06/11/2018 2123   ACIDBASEDEF 3.0 (H) 06/11/2018 2123   O2SAT 100.0 06/11/2018 2123       Richardson Landry Aubrionna Istre ACNP Maryanna Shape PCCM Pager [phone] till 1 pm If no answer page [phone] 06/12/2018, 1:12 PM

## 2018-06-12 NOTE — Progress Notes (Signed)
Nutrition Brief Note  Patient identified as newly ventilated patient.   Per conference with nurse, pt intubated d/t impending surgery tomorrow morning. Respiratory wise, he is stable and unless complications arise will likely be extubated shortly after surgery tomorrow    Will defer Assessment/TF recommendations  If nutrition issues arise, please consult RD.   Burtis Junes RD, LDN, CNSC Clinical Nutrition Available Tues-Sat via Pager: [phone] 06/12/2018 6:17 PM

## 2018-06-12 NOTE — Progress Notes (Addendum)
   Subjective: 1 Day Post-Op Procedure(s) (LRB): L4 Corpectomy, L3-5 anterior reconstruction, (Left)  Pt awake and alert but currently on the vent C/o moderate pain to low back Able to feel bilateral lower legs and move them well Reviewed notes from surgery and critical care team Patient reports pain as moderate.  Objective:   VITALS:   Vitals:   06/12/18 0700 06/12/18 0728  BP: 103/72   Pulse: 99 (!) 102  Resp: 18 20  Temp:    SpO2: 98% 98%    Pt awake and alert currently nv intact distally Not taken thru full exam and incision was not evaluated due to current vent placement Family at bedside  LABS Recent Labs    06/11/18 1810 06/11/18 2326 06/12/18 0456  HGB 9.2* 11.2* 10.8*  HCT 27.0* 33.5* 32.3*  WBC  --  13.7* 11.7*  PLT  --  181 185    Recent Labs    06/11/18 1801 06/11/18 1810 06/11/18 2326 06/12/18 0456  NA 141 140 138 137  K 3.9 3.8 4.0 3.5  BUN  --   --  13 12  CREATININE  --   --  0.69 0.82  GLUCOSE 95  --  121* 107*     Assessment/Plan: 1 Day Post-Op Procedure(s) (LRB): L4 Corpectomy, L3-5 anterior reconstruction, (Left) Appreciate critical care team aiding with his care Will continue to monitor his progress  Pain management     Kathrynn Speed, [place] [facility] now [facility]  [place] [address], [place], [place], [place] [address] Phone: [phone] [email or web address]  Engineer, structural     Addendum: Patient remains stable in ICU. Moving all four extremities Agitation secondary to intubationg HCT - stable Does not require pressors. Pain control remains an issue - recommend fentanyl drip - will defer final discission to CCM Plan on return to OR tomorrow for posterior pedicle screw fixation L3-5

## 2018-06-13 ENCOUNTER — Inpatient Hospital Stay (HOSPITAL_COMMUNITY): Payer: Medicare Other

## 2018-06-13 ENCOUNTER — Inpatient Hospital Stay (HOSPITAL_COMMUNITY): Payer: Medicare Other | Admitting: Anesthesiology

## 2018-06-13 ENCOUNTER — Encounter (HOSPITAL_COMMUNITY)
Admission: AD | Disposition: A | Discharge status: Rehabilitation Facility | Payer: Self-pay | Source: Ambulatory Visit | Attending: Orthopedic Surgery

## 2018-06-13 DIAGNOSIS — C801 Malignant (primary) neoplasm, unspecified: Secondary | ICD-10-CM

## 2018-06-13 LAB — CBC WITH DIFFERENTIAL/PLATELET
Abs Immature Granulocytes: 0.19 10*3/uL — ABNORMAL HIGH (ref 0.00–0.07)
Basophils Absolute: 0 10*3/uL (ref 0.0–0.1)
Basophils Relative: 0 %
Eosinophils Absolute: 0.1 10*3/uL (ref 0.0–0.5)
Eosinophils Relative: 1 %
HCT: 28.8 % — ABNORMAL LOW (ref 39.0–52.0)
Hemoglobin: 9.7 g/dL — ABNORMAL LOW (ref 13.0–17.0)
Immature Granulocytes: 1 %
LYMPHS ABS: 1.5 10*3/uL (ref 0.7–4.0)
Lymphocytes Relative: 10 %
MCH: 31.2 pg (ref 26.0–34.0)
MCHC: 33.7 g/dL (ref 30.0–36.0)
MCV: 92.6 fL (ref 80.0–100.0)
Monocytes Absolute: 0.9 10*3/uL (ref 0.1–1.0)
Monocytes Relative: 6 %
NRBC: 0 % (ref 0.0–0.2)
Neutro Abs: 12.1 10*3/uL — ABNORMAL HIGH (ref 1.7–7.7)
Neutrophils Relative %: 82 %
Platelets: 181 10*3/uL (ref 150–400)
RBC: 3.11 MIL/uL — ABNORMAL LOW (ref 4.22–5.81)
RDW: 15.6 % — ABNORMAL HIGH (ref 11.5–15.5)
WBC: 14.8 10*3/uL — ABNORMAL HIGH (ref 4.0–10.5)

## 2018-06-13 LAB — GLUCOSE, CAPILLARY
Glucose-Capillary: 103 mg/dL — ABNORMAL HIGH (ref 70–99)
Glucose-Capillary: 112 mg/dL — ABNORMAL HIGH (ref 70–99)
Glucose-Capillary: 116 mg/dL — ABNORMAL HIGH (ref 70–99)
Glucose-Capillary: 120 mg/dL — ABNORMAL HIGH (ref 70–99)
Glucose-Capillary: 134 mg/dL — ABNORMAL HIGH (ref 70–99)
Glucose-Capillary: 128 mg/dL — ABNORMAL HIGH (ref 70–99)

## 2018-06-13 LAB — POCT I-STAT 7, (LYTES, BLD GAS, ICA,H+H)
Acid-base deficit: 2 mmol/L (ref 0.0–2.0)
Bicarbonate: 22.7 mmol/L (ref 20.0–28.0)
Calcium, Ion: 0.98 mmol/L — ABNORMAL LOW (ref 1.15–1.40)
HCT: 22 % — ABNORMAL LOW (ref 39.0–52.0)
Hemoglobin: 7.5 g/dL — ABNORMAL LOW (ref 13.0–17.0)
O2 Saturation: 100 %
PCO2 ART: 38.1 mmHg (ref 32.0–48.0)
Patient temperature: 37
Potassium: 3.2 mmol/L — ABNORMAL LOW (ref 3.5–5.1)
Sodium: 139 mmol/L (ref 135–145)
TCO2: 24 mmol/L (ref 22–32)
pH, Arterial: 7.383 (ref 7.350–7.450)
pO2, Arterial: 338 mmHg — ABNORMAL HIGH (ref 83.0–108.0)

## 2018-06-13 LAB — BASIC METABOLIC PANEL
Anion gap: 9 (ref 5–15)
Anion gap: 8 (ref 5–15)
BUN: 8 mg/dL (ref 8–23)
BUN: 7 mg/dL — ABNORMAL LOW (ref 8–23)
CALCIUM: 7 mg/dL — AB (ref 8.9–10.3)
CO2: 21 mmol/L — ABNORMAL LOW (ref 22–32)
CO2: 22 mmol/L (ref 22–32)
CREATININE: 0.77 mg/dL (ref 0.61–1.24)
Calcium: 6.7 mg/dL — ABNORMAL LOW (ref 8.9–10.3)
Chloride: 108 mmol/L (ref 98–111)
Chloride: 104 mmol/L (ref 98–111)
Creatinine, Ser: 0.62 mg/dL (ref 0.61–1.24)
GFR calc Af Amer: >60 mL/min (ref >60)
GFR calc non Af Amer: >60 mL/min (ref >60)
Glucose, Bld: 113 mg/dL — ABNORMAL HIGH (ref 70–99)
Glucose, Bld: 144 mg/dL — ABNORMAL HIGH (ref 70–99)
Potassium: 3.2 mmol/L — ABNORMAL LOW (ref 3.5–5.1)
Potassium: 3.7 mmol/L (ref 3.5–5.1)
Sodium: 138 mmol/L (ref 135–145)
Sodium: 134 mmol/L — ABNORMAL LOW (ref 135–145)

## 2018-06-13 LAB — PHOSPHORUS
PHOSPHORUS: 2.3 mg/dL — AB (ref 2.5–4.6)
Phosphorus: 3 mg/dL (ref 2.5–4.6)

## 2018-06-13 LAB — TROPONIN I: Troponin I: <0.03 ng/mL (ref <0.03)

## 2018-06-13 LAB — MAGNESIUM
MAGNESIUM: 2.2 mg/dL (ref 1.7–2.4)
Magnesium: 2.1 mg/dL (ref 1.7–2.4)

## 2018-06-13 SURGERY — POSTERIOR LUMBAR FUSION 2 LEVEL
Anesthesia: General | Site: Back

## 2018-06-13 MED ORDER — PROPOFOL 1000 MG/100ML IV EMUL
5.0000 ug/kg/min | INTRAVENOUS | Status: DC
  Administered 2018-06-13 (×2): 5 ug/kg/min via INTRAVENOUS
  Administered 2018-06-14: 10 ug/kg/min via INTRAVENOUS
  Filled 2018-06-13 (×2): qty 100

## 2018-06-13 MED ORDER — BUPIVACAINE-EPINEPHRINE (PF) 0.25% -1:200000 IJ SOLN
INTRAMUSCULAR | Status: AC
  Filled 2018-06-13: qty 30

## 2018-06-13 MED ORDER — ORAL CARE MOUTH RINSE
15.0000 mL | OROMUCOSAL | Status: DC
  Administered 2018-06-13 – 2018-06-14 (×7): 15 mL via OROMUCOSAL

## 2018-06-13 MED ORDER — THROMBIN (RECOMBINANT) 20000 UNITS EX SOLR
CUTANEOUS | Status: AC
  Filled 2018-06-13: qty 20000

## 2018-06-13 MED ORDER — BUPIVACAINE-EPINEPHRINE 0.25% -1:200000 IJ SOLN
INTRAMUSCULAR | Status: DC | PRN
  Administered 2018-06-13: 16 mL

## 2018-06-13 MED ORDER — PHENYLEPHRINE HCL-NACL 10-0.9 MG/250ML-% IV SOLN
0.0000 ug/min | INTRAVENOUS | Status: DC
  Filled 2018-06-13: qty 250

## 2018-06-13 MED ORDER — SODIUM CHLORIDE 0.9 % IV BOLUS
500.0000 mL | Freq: Once | INTRAVENOUS | Status: AC
  Administered 2018-06-13: 500 mL via INTRAVENOUS

## 2018-06-13 MED ORDER — THROMBIN 20000 UNITS EX SOLR
CUTANEOUS | Status: DC | PRN
  Administered 2018-06-13: 13:00:00 via TOPICAL

## 2018-06-13 MED ORDER — METHOCARBAMOL 500 MG PO TABS
500.0000 mg | ORAL_TABLET | Freq: Four times a day (QID) | ORAL | Status: DC | PRN
  Administered 2018-06-14 – 2018-06-15 (×3): 500 mg via ORAL
  Filled 2018-06-13 (×3): qty 1

## 2018-06-13 MED ORDER — HYDROCODONE-ACETAMINOPHEN 5-325 MG PO TABS: 1.0000 | ORAL_TABLET | ORAL | Status: DC | PRN

## 2018-06-13 MED ORDER — 0.9 % SODIUM CHLORIDE (POUR BTL) OPTIME
TOPICAL | Status: DC | PRN
  Administered 2018-06-13: 1000 mL

## 2018-06-13 MED ORDER — PHENOL 1.4 % MT LIQD: 1.0000 | OROMUCOSAL | Status: DC | PRN

## 2018-06-13 MED ORDER — CALCIUM GLUCONATE-NACL 1-0.675 GM/50ML-% IV SOLN
1.0000 g | Freq: Once | INTRAVENOUS | Status: AC
  Administered 2018-06-13: 1000 mg via INTRAVENOUS
  Filled 2018-06-13: qty 50

## 2018-06-13 MED ORDER — ENOXAPARIN SODIUM 40 MG/0.4ML ~~LOC~~ SOLN
40.0000 mg | SUBCUTANEOUS | Status: DC
  Administered 2018-06-14 – 2018-06-19 (×6): 40 mg via SUBCUTANEOUS
  Filled 2018-06-13 (×6): qty 0.4

## 2018-06-13 MED ORDER — VANCOMYCIN HCL IN DEXTROSE 1-5 GM/200ML-% IV SOLN
1000.0000 mg | Freq: Two times a day (BID) | INTRAVENOUS | Status: DC
  Administered 2018-06-13 – 2018-06-14 (×2): 1000 mg via INTRAVENOUS
  Filled 2018-06-13 (×2): qty 200

## 2018-06-13 MED ORDER — MENTHOL 3 MG MT LOZG
1.0000 | LOZENGE | OROMUCOSAL | Status: DC | PRN
  Administered 2018-06-20: 3 mg via ORAL
  Filled 2018-06-13 (×2): qty 9

## 2018-06-13 MED ORDER — ACETAMINOPHEN 650 MG RE SUPP: 650.0000 mg | RECTAL | Status: DC | PRN

## 2018-06-13 MED ORDER — SODIUM CHLORIDE 0.9 % IV SOLN
INTRAVENOUS | Status: DC | PRN
  Administered 2018-06-13: 09:00:00 via INTRAVENOUS

## 2018-06-13 MED ORDER — SURGIFOAM 100 EX MISC
CUTANEOUS | Status: DC | PRN
  Administered 2018-06-13: 1 via TOPICAL

## 2018-06-13 MED ORDER — SODIUM CHLORIDE 0.9 % IV SOLN: 250.0000 mL | INTRAVENOUS | Status: DC

## 2018-06-13 MED ORDER — GABAPENTIN 300 MG PO CAPS
300.0000 mg | ORAL_CAPSULE | Freq: Three times a day (TID) | ORAL | Status: DC
  Administered 2018-06-13 – 2018-06-15 (×4): 300 mg via ORAL
  Filled 2018-06-13 (×6): qty 1

## 2018-06-13 MED ORDER — DEXAMETHASONE SODIUM PHOSPHATE 10 MG/ML IJ SOLN
INTRAMUSCULAR | Status: DC | PRN
  Administered 2018-06-13: 10 mg via INTRAVENOUS

## 2018-06-13 MED ORDER — ACETAMINOPHEN 325 MG PO TABS: 650.0000 mg | ORAL_TABLET | ORAL | Status: DC | PRN

## 2018-06-13 MED ORDER — POTASSIUM CHLORIDE 20 MEQ/15ML (10%) PO SOLN: 40.0000 meq | Freq: Once | ORAL | Status: DC

## 2018-06-13 MED ORDER — PHENYLEPHRINE HCL-NACL 10-0.9 MG/250ML-% IV SOLN
0.0000 ug/min | INTRAVENOUS | Status: DC
  Administered 2018-06-13: 20 ug/min via INTRAVENOUS

## 2018-06-13 MED ORDER — SODIUM CHLORIDE 0.9% FLUSH: 3.0000 mL | INTRAVENOUS | Status: DC | PRN

## 2018-06-13 MED ORDER — SODIUM CHLORIDE 0.9% FLUSH: 3.0000 mL | Freq: Two times a day (BID) | INTRAVENOUS | Status: DC

## 2018-06-13 MED ORDER — CHLORHEXIDINE GLUCONATE 0.12% ORAL RINSE (MEDLINE KIT)
15.0000 mL | Freq: Two times a day (BID) | OROMUCOSAL | Status: DC
  Administered 2018-06-13 – 2018-06-21 (×12): 15 mL via OROMUCOSAL

## 2018-06-13 MED ORDER — ONDANSETRON HCL 4 MG PO TABS: 4.0000 mg | ORAL_TABLET | Freq: Four times a day (QID) | ORAL | Status: DC | PRN

## 2018-06-13 MED ORDER — FENTANYL CITRATE (PF) 250 MCG/5ML IJ SOLN
INTRAMUSCULAR | Status: AC
  Filled 2018-06-13: qty 5

## 2018-06-13 MED ORDER — LACTATED RINGERS IV SOLN
INTRAVENOUS | Status: DC | PRN
  Administered 2018-06-13: 12:00:00 via INTRAVENOUS

## 2018-06-13 MED ORDER — ONDANSETRON HCL 4 MG/2ML IJ SOLN: 4.0000 mg | Freq: Four times a day (QID) | INTRAMUSCULAR | Status: DC | PRN

## 2018-06-13 MED ORDER — POTASSIUM PHOSPHATES 15 MMOLE/5ML IV SOLN
20.0000 mmol | Freq: Once | INTRAVENOUS | Status: AC
  Administered 2018-06-13: 20 mmol via INTRAVENOUS
  Filled 2018-06-13: qty 6.67

## 2018-06-13 MED ORDER — HYDROCODONE-ACETAMINOPHEN 10-325 MG PO TABS
2.0000 | ORAL_TABLET | ORAL | Status: DC | PRN
  Administered 2018-06-14 – 2018-06-15 (×3): 2 via ORAL
  Filled 2018-06-13 (×4): qty 2

## 2018-06-13 MED ORDER — PROPOFOL 10 MG/ML IV BOLUS
INTRAVENOUS | Status: AC
  Filled 2018-06-13: qty 20

## 2018-06-13 MED ORDER — LACTATED RINGERS IV SOLN
INTRAVENOUS | Status: DC
  Administered 2018-06-13 – 2018-06-22 (×16): via INTRAVENOUS

## 2018-06-13 MED ORDER — METHOCARBAMOL 1000 MG/10ML IJ SOLN
500.0000 mg | Freq: Four times a day (QID) | INTRAVENOUS | Status: DC | PRN
  Administered 2018-06-14 – 2018-06-22 (×5): 500 mg via INTRAVENOUS
  Filled 2018-06-13 (×6): qty 5
  Filled 2018-06-13: qty 500

## 2018-06-13 MED ORDER — ONDANSETRON HCL 4 MG/2ML IJ SOLN
INTRAMUSCULAR | Status: DC | PRN
  Administered 2018-06-13: 4 mg via INTRAVENOUS

## 2018-06-13 SURGICAL SUPPLY — 71 items
BLADE CLIPPER SURG (BLADE) IMPLANT
BUR EGG ELITE 4.0 (BURR) IMPLANT
BUR EGG ELITE 4.0MM (BURR)
CABLE BIPOLOR RESECTION CORD (MISCELLANEOUS) ×3 IMPLANT
CLIP NEUROVISION LG (CLIP) ×3 IMPLANT
CLOSURE STERI-STRIP 1/2X4 (GAUZE/BANDAGES/DRESSINGS)
CLOSURE WOUND 1/2 X4 (GAUZE/BANDAGES/DRESSINGS) ×1
CLSR STERI-STRIP ANTIMIC 1/2X4 (GAUZE/BANDAGES/DRESSINGS) IMPLANT
COVER MAYO STAND STRL (DRAPES) IMPLANT
COVER SURGICAL LIGHT HANDLE (MISCELLANEOUS) ×3 IMPLANT
COVER WAND RF STERILE (DRAPES) ×3 IMPLANT
DRAPE C-ARM 42X72 X-RAY (DRAPES) ×3 IMPLANT
DRAPE C-ARMOR (DRAPES) ×3 IMPLANT
DRAPE POUCH INSTRU U-SHP 10X18 (DRAPES) ×3 IMPLANT
DRAPE SURG 17X23 STRL (DRAPES) ×3 IMPLANT
DRAPE U-SHAPE 47X51 STRL (DRAPES) ×3 IMPLANT
DRSG OPSITE POSTOP 4X8 (GAUZE/BANDAGES/DRESSINGS) ×3 IMPLANT
DURAPREP 26ML APPLICATOR (WOUND CARE) ×3 IMPLANT
ELECT BLADE 4.0 EZ CLEAN MEGAD (MISCELLANEOUS)
ELECT BLADE 6.5 EXT (BLADE) IMPLANT
ELECT CAUTERY BLADE 6.4 (BLADE) ×3 IMPLANT
ELECT PENCIL ROCKER SW 15FT (MISCELLANEOUS) ×3 IMPLANT
ELECT REM PT RETURN 9FT ADLT (ELECTROSURGICAL) ×3
ELECTRODE BLDE 4.0 EZ CLN MEGD (MISCELLANEOUS) IMPLANT
ELECTRODE REM PT RTRN 9FT ADLT (ELECTROSURGICAL) ×1 IMPLANT
GLOVE BIOGEL PI IND STRL 8.5 (GLOVE) ×1 IMPLANT
GLOVE BIOGEL PI INDICATOR 8.5 (GLOVE) ×2
GLOVE SS BIOGEL STRL SZ 8.5 (GLOVE) ×1 IMPLANT
GLOVE SUPERSENSE BIOGEL SZ 8.5 (GLOVE) ×2
GOWN STRL REUS W/ TWL LRG LVL3 (GOWN DISPOSABLE) ×1 IMPLANT
GOWN STRL REUS W/TWL 2XL LVL3 (GOWN DISPOSABLE) ×6 IMPLANT
GOWN STRL REUS W/TWL LRG LVL3 (GOWN DISPOSABLE) ×2
KIT BASIN OR (CUSTOM PROCEDURE TRAY) ×3 IMPLANT
KIT POSITION SURG JACKSON T1 (MISCELLANEOUS) ×3 IMPLANT
KIT TURNOVER KIT B (KITS) ×3 IMPLANT
MODULE NVM5 NEXT GEN EMG (NEEDLE) ×3 IMPLANT
NEEDLE 22X1 1/2 (OR ONLY) (NEEDLE) ×3 IMPLANT
NEEDLE I-PASS III (NEEDLE) ×3 IMPLANT
NEEDLE SPNL 18GX3.5 QUINCKE PK (NEEDLE) ×3 IMPLANT
NS IRRIG 1000ML POUR BTL (IV SOLUTION) ×3 IMPLANT
PACK LAMINECTOMY ORTHO (CUSTOM PROCEDURE TRAY) ×3 IMPLANT
PACK UNIVERSAL I (CUSTOM PROCEDURE TRAY) ×3 IMPLANT
PAD ARMBOARD 7.5X6 YLW CONV (MISCELLANEOUS) ×6 IMPLANT
PATTIES SURGICAL .5 X.5 (GAUZE/BANDAGES/DRESSINGS) ×3 IMPLANT
PATTIES SURGICAL .5 X1 (DISPOSABLE) ×3 IMPLANT
POSITIONER HEAD PRONE TRACH (MISCELLANEOUS) ×3 IMPLANT
PROBE BALL TIP NVM5 SNG USE (BALLOONS) ×3 IMPLANT
ROD SPINAL 5.5X80 TI LORDOSE (Rod) ×6 IMPLANT
SCREW LOCK RELINE 5.5 TULIP (Screw) ×12 IMPLANT
SCREW RELINE RED 6.5X50MM POLY (Screw) ×12 IMPLANT
SPONGE LAP 4X18 RFD (DISPOSABLE) ×6 IMPLANT
SPONGE SURGIFOAM ABS GEL 100 (HEMOSTASIS) ×3 IMPLANT
STRIP CLOSURE SKIN 1/2X4 (GAUZE/BANDAGES/DRESSINGS) ×2 IMPLANT
SURGIFLO W/THROMBIN 8M KIT (HEMOSTASIS) IMPLANT
SUT BONE WAX W31G (SUTURE) ×3 IMPLANT
SUT MNCRL AB 3-0 PS2 18 (SUTURE) ×6 IMPLANT
SUT MON AB 3-0 SH 27 (SUTURE) ×2
SUT MON AB 3-0 SH27 (SUTURE) ×1 IMPLANT
SUT VIC AB 1 CT1 18XCR BRD 8 (SUTURE) IMPLANT
SUT VIC AB 1 CT1 8-18 (SUTURE)
SUT VIC AB 1 CTX 36 (SUTURE)
SUT VIC AB 1 CTX36XBRD ANBCTR (SUTURE) IMPLANT
SUT VIC AB 2-0 CT1 18 (SUTURE) ×9 IMPLANT
SYR BULB IRRIGATION 50ML (SYRINGE) ×3 IMPLANT
SYR CONTROL 10ML LL (SYRINGE) ×3 IMPLANT
TOWEL GREEN STERILE (TOWEL DISPOSABLE) ×3 IMPLANT
TOWEL GREEN STERILE FF (TOWEL DISPOSABLE) ×3 IMPLANT
TRAY FOLEY MTR SLVR 16FR STAT (SET/KITS/TRAYS/PACK) ×3 IMPLANT
TUBING BULK SUCTION (MISCELLANEOUS) ×3 IMPLANT
WATER STERILE IRR 1000ML POUR (IV SOLUTION) ×3 IMPLANT
YANKAUER SUCT BULB TIP NO VENT (SUCTIONS) ×3 IMPLANT

## 2018-06-13 NOTE — Progress Notes (Signed)
Pt with increasing frequency of couplet PVCs. EKG obtained, reading sinus rhythm with frequent and consecutive PVCs. CCM paged, orders received for BMP, mag, phos and troponin levels. Drawn and sent.

## 2018-06-13 NOTE — Progress Notes (Signed)
Patient in OR no vent check done at this time.

## 2018-06-13 NOTE — Brief Op Note (Signed)
06/13/2018  1:41 PM  PATIENT:  [name]  72 y.o. male  PRE-OPERATIVE DIAGNOSIS:  lumbar spine tumor  POST-OPERATIVE DIAGNOSIS:  lumbar spine tumor  PROCEDURE:  Procedure(s): POSTERIOR SPINAL FUSION L3-L5 (N/A)  SURGEON:  Surgeon(s) and Role:    Melina Schools, MD - Primary  PHYSICIAN ASSISTANT:   ASSISTANTS: none   ANESTHESIA:   general  EBL:  50 mL   BLOOD ADMINISTERED:none  DRAINS: none   LOCAL MEDICATIONS USED:  MARCAINE     SPECIMEN:  No Specimen  DISPOSITION OF SPECIMEN:  N/A  COUNTS:  YES  TOURNIQUET:  * No tourniquets in log *  DICTATION: .Dragon Dictation  PLAN OF CARE: Admit to inpatient   PATIENT DISPOSITION:  ICU - intubated and hemodynamically stable.

## 2018-06-13 NOTE — Anesthesia Postprocedure Evaluation (Signed)
Anesthesia Post Note  Patient: Brian [name]  Procedure(s) Performed: POSTERIOR SPINAL FUSION L3-L5 (N/A Back)     Patient location during evaluation: SICU Anesthesia Type: General Level of consciousness: sedated Pain management: pain level controlled Vital Signs Assessment: post-procedure vital signs reviewed and stable Respiratory status: patient remains intubated per anesthesia plan Cardiovascular status: stable Postop Assessment: no apparent nausea or vomiting Anesthetic complications: no    Last Vitals:  Vitals:   06/13/18 1600 06/13/18 1615  BP: (!) 87/57   Pulse: 80 80  Resp: 15 15  Temp: 36.8 C   SpO2: 95% 94%    Last Pain:  Vitals:   06/13/18 1600  TempSrc: Axillary  PainSc:                  Karyl Kinnier Meily Glowacki

## 2018-06-13 NOTE — Progress Notes (Signed)
NAME:  Brian [name], MRN:  [id number], DOB:  31-Jan-1946, LOS: 9 ADMISSION DATE:  06/04/2018, CONSULTATION DATE:  06/11/2018 REFERRING MD:  Dr. Rolena Infante, CHIEF COMPLAINT:   Ventilator management  History of present illness   72 year old man with history of RA, emphysema, tobacco use admitted on 06/04/2018 for lower back pain found to have pathologic L4 fracture and CT chest with right lower lobe mass concerning for bronchogenic carcinoma with metastasis to bone. He underwent L4 Corpectomy, L3-5 anterior reconstruction with ortho on 06/11/2018. During the procedure he received 4 units of pRBC and 2 units of FFP.   Past Medical History  Rheumatoid arthritis, emphysema, tobacco use   Significant Hospital Events   12/13 admission 12/20 L4 Corpectomy, L3-5 anterior reconstruction 06/11/2018 intubated during surgery  Procedures:  12/20 L4 Corpectomy, L3-5 anterior reconstruction 06/11/2018 intubation>> 06/11/2018 right radial A-line>> 06/11/2018 Hemovac drains lumbar area>>  Significant Diagnostic Tests:  CTA abd/pelvis on 12/20 retroperitoneal hematoma seen  CXR on 12/20: Endotracheal tube tip is 8.2 cm above the base the carina. Known right lower lobe mass projects over the right hilum. Chronic interstitial lung disease. Atelectasis or infiltrate at the right base.  CT chest/abd/pelvis w/ contrast on 12/13: Right lower lobe mass lesion. Destructive lesions in the left clavicle, right seventh ribs and L4 vertebral body. Small mediastinal lymph nodes measuring up to 1.5 cm. Two lesions in the liver. Advanced emphysema. Calcific aortic and coronary atherosclerosis. 3.3 cm abdominal aortic aneurysm is identified. Gallstones without evidence of cholecystitis. Cystic lesion in the subcutaneous tissues of the anterior left chest wall. Simple lipoma right gluteal musculature.  MR brain w/ w/o contrast on 12/16 No evidence of metastatic disease. Moderate chronic small-vessel ischemic changes    NM bone scan 12/16 Abnormal uptake is seen in midportion of right rib, L4 vertebral body and anterior portions of left first rib and left clavicle  MR lumbar spine 12/13 Metastatic lesion of the L4 vertebral body with associated pathologic fracture and posterior bulging of the vertebral body, overlying epidural thickening, severe spinal canal stenosis and severe right neural foraminal stenosis. Moderate right L5-S1 neural foraminal stenosis  EKG 12/16 with sinus bradycardia, no previous for comparison  Micro Data:  MRSA screen neg on 12/18  Antimicrobials:  Vanc x 1 on 12/20 06/11/2018 vancomycin>> Interim history/subjective:  06/13/2018 awake alert despite sedation is a tendency to become hypotensive when sedation is increased.  Given normal saline 500 cc and Neo-Synephrine drip as needed to achieve pain control  Objective   Blood pressure 95/66, pulse 96, temperature 98.3 F (36.8 C), temperature source Axillary, resp. rate 16, height 5\' 10"  (1.778 m), weight 93.8 kg, SpO2 99 %.    Vent Mode: PRVC FiO2 (%):  [30 %-40 %] 30 % Set Rate:  [15 bmp] 15 bmp Vt Set:  [580 mL] 580 mL PEEP:  [5 cmH20] 5 cmH20 Plateau Pressure:  [12 cmH20-16 cmH20] 13 cmH20   Intake/Output Summary (Last 24 hours) at 06/13/2018 0806 Last data filed at 06/13/2018 0700 Gross per 24 hour  Intake 2700.38 ml  Output 1450 ml  Net 1250.38 ml   Filed Weights   06/11/18 2000 06/12/18 0500 06/13/18 0500  Weight: 92.1 kg 93 kg 93.8 kg    Examination: General: Nourished well-developed male who though is still sedated with propofol and fentanyl is awake and alert HEENT: Endotracheal tube orogastric tube in place Neuro: Moves all extremities follows commands able to communicate nonverbally CV: Heart sounds are regular PULM:  decreased breath  sounds at bases GI: Tense, nontender, positive bowel sounds Extremities: warm/dry, negative edema  Skin: no rashes or lesions, Hemovac in place and fully  charged     Assessment & Plan:  72 year old man with history of RA, emphysema, tobacco use admitted on 06/04/2018 here with pathologic L4 fracture and right lower lobe mass concerning for bronchogenic carcinoma with metastasis to bone now s/p L4 Corpectomy, L3-5 anterior reconstruction with ortho on 06/11/2018  Acute hypercarbic respiratory failure: Right lung mass questionable bronchogenic carcinoma with metastasis to the bone. Currently FiO2 has been weaned down to 30% with O2 saturations 98% -Continue full ventilator support, scheduled for OR today 06/13/2018, wean as able once returns from the OR -Wean O2 per protocol -Serial chest x-rays and arterial blood gases -Chest mass with suspected bronchogenic carcinoma with bone mets.  Hypotension when sedation is increased it is become hypotensive -500 cc bolus normal saline -Neo-Synephrine drip as needed -Encouraged nurse to use p.o. oxycodone      Acute blood loss anemia: Received 4 units of pRBC and 2 units of FFP in OR 12/20. Hgb 9.5 at 1800.  Recent Labs    06/12/18 0456 06/13/18 0544  HGB 10.8* 9.7*    -Monitor CBC  -Interventional radiology is on standby for any bleeding issues  L4 fracture: s/p L4 Corpectomy, L3-5 anterior reconstruction with ortho on 06/11/2018 -Per orthopedics, to return to the OR today 06/13/2018  RA, chronic pain: On dilaudid PO at home. Was previously on methotrexate. Had 3 weeklong pred tapers prior to hospitalization. -Currently holding Dilaudid using fentanyl and propofol and p.o. oxycodone -Hold methotrexate  Emphysema, possible COPD:  -Bronchodilators  Abdominal aortic aneurysm: -We will need outpatient follow-up   Best practice:  Diet: NPO if not extubated within 24 hours start tube feedings Pain/Anxiety/Delirium protocol (if indicated): Fentanyl and propofol drips VAP protocol (if indicated): Peridex BID and rest of protocol ordered DVT prophylaxis: SCDs GI prophylaxis:  pepcid Glucose control: normoglycemic Mobility: PT with precautions per ortho Code Status: FULL Family Communication: 11/11/2016 no family at bedside, patient updated Disposition: Continue in ICU  Labs   CBC: Recent Labs  Lab 06/11/18 1801 06/11/18 1810 06/11/18 2326 06/12/18 0456 06/13/18 0544  WBC  --   --  13.7* 11.7* 14.8*  NEUTROABS  --   --   --   --  12.1*  HGB 9.5* 9.2* 11.2* 10.8* 9.7*  HCT 28.0* 27.0* 33.5* 32.3* 28.8*  MCV  --   --  89.1 88.7 92.6  PLT  --   --  181 185 659    Basic Metabolic Panel: Recent Labs  Lab 06/11/18 1801 06/11/18 1810 06/11/18 2326 06/12/18 0456 06/13/18 0544  NA 141 140 138 137 138  K 3.9 3.8 4.0 3.5 3.2*  CL  --   --  110 107 108  CO2  --   --  20* 20* 21*  GLUCOSE 95  --  121* 107* 113*  BUN  --   --  13 12 8   CREATININE  --   --  0.69 0.82 0.77  CALCIUM  --   --  7.6* 7.3* 7.0*  MG  --   --   --   --  2.1  PHOS  --   --   --   --  2.3*   GFR: Estimated Creatinine Clearance: 96 mL/min (by C-G formula based on SCr of 0.77 mg/dL). Recent Labs  Lab 06/11/18 2326 06/12/18 0456 06/13/18 0544  WBC 13.7* 11.7* 14.8*  Liver Function Tests: No results for input(s): AST, ALT, ALKPHOS, BILITOT, PROT, ALBUMIN in the last 168 hours.  ABG    Component Value Date/Time   PHART 7.381 06/11/2018 2123   PCO2ART 35.7 06/11/2018 2123   PO2ART 222.0 (H) 06/11/2018 2123   HCO3 21.2 06/11/2018 2123   TCO2 22 06/11/2018 2123   ACIDBASEDEF 3.0 (H) 06/11/2018 2123   O2SAT 100.0 06/11/2018 2123     App CCT 30 min  Richardson Landry  ACNP [facility] P[facility] Pager [phone] till 1 pm If no answer page [phone] 06/13/2018, 8:06 AM

## 2018-06-13 NOTE — Progress Notes (Signed)
CSW received consult for SNF. CSW will continue to follow up as needed was PT recommendations are made.  Lamonte Richer, LCSW, [place] Worker II [phone]

## 2018-06-13 NOTE — Progress Notes (Signed)
Spoke with Dr. Rolena Infante PA regarding pt status. CCM instructed RN to wean patient as tolerated, no orders to extubate. RT informed.   Wife states will bring in brace from home.

## 2018-06-13 NOTE — Anesthesia Preprocedure Evaluation (Addendum)
Anesthesia Evaluation  Patient identified by MRN, date of birth, ID band Patient unresponsive    Reviewed: Allergy & Precautions, NPO status , Patient's Chart, lab work & pertinent test results  Airway Mallampati: Intubated  TM Distance: >3 FB Neck ROM: Full    Dental no notable dental hx.    Pulmonary Current Smoker,  Intubated Lung cancer   Pulmonary exam normal breath sounds clear to auscultation       Cardiovascular negative cardio ROS Normal cardiovascular exam Rhythm:Regular Rate:Normal  ECG: SB, rate 57   Neuro/Psych negative neurological ROS  negative psych ROS   GI/Hepatic negative GI ROS, Neg liver ROS,   Endo/Other  negative endocrine ROS  Renal/GU negative Renal ROS     Musculoskeletal  (+) Arthritis , Rheumatoid disorders,  Metastatic cancer   Abdominal   Peds  Hematology  (+) anemia ,   Anesthesia Other Findings lumbar spine tumor  Reproductive/Obstetrics                            Anesthesia Physical Anesthesia Plan  ASA: III  Anesthesia Plan: General   Post-op Pain Management:    Induction: Intravenous and Inhalational  PONV Risk Score and Plan: 1 and Ondansetron, Dexamethasone, Midazolam and Treatment may vary due to age or medical condition  Airway Management Planned: Oral ETT  Additional Equipment:   Intra-op Plan:   Post-operative Plan: Post-operative intubation/ventilation  Informed Consent: I have reviewed the patients History and Physical, chart, labs and discussed the procedure including the risks, benefits and alternatives for the proposed anesthesia with the patient or authorized representative who has indicated his/her understanding and acceptance.   Dental advisory given  Plan Discussed with: CRNA  Anesthesia Plan Comments:         Anesthesia Quick Evaluation

## 2018-06-13 NOTE — Transfer of Care (Signed)
Immediate Anesthesia Transfer of Care Note  Patient: Brian [name]  Procedure(s) Performed: POSTERIOR SPINAL FUSION L3-L5 (N/A Back)  Patient Location: NICU  Anesthesia Type:General  Level of Consciousness: Patient remains intubated per anesthesia plan  Airway & Oxygen Therapy: Patient remains intubated per anesthesia plan  Post-op Assessment: Report given to RN and Post -op Vital signs reviewed and stable  Post vital signs: Reviewed and stable  Last Vitals:  Vitals Value Taken Time  BP    Temp    Pulse 83 06/13/2018  1:57 PM  Resp 15 06/13/2018  1:57 PM  SpO2 96 % 06/13/2018  1:57 PM  Vitals shown include unvalidated device data.  Last Pain:  Vitals:   06/13/18 0800  TempSrc: Axillary  PainSc:       Patients Stated Pain Goal: 2 (82/06/01 5615)  Complications: No apparent anesthesia complications

## 2018-06-13 NOTE — Op Note (Signed)
Operative report  Preoperative diagnosis: Metastatic bronchogenic carcinoma to the spine.  Status post L4 corpectomy with L3-5 instrumentation.  Postoperative diagnosis: Same  Operative procedure: Posterior supplemental pedicle screw fixation L3-5.  Complications: None  Intraoperative neuro monitoring: No adverse SSEP or free running EMG activity.  All 4 pedicle screws were directly stimulated and there was no abnormal activity at greater than 40 mA.  Implants: Nuvasive MIS pedicle screws 6.5 x 85mm x4.  Indications: This is a very pleasant gentleman who on Friday underwent a lateral L4 corpectomy with L3-5 instrumented fusion.  At that time the procedure was set for a posterior supplemental fixation.  However because of intraoperative bleeding the procedure was terminated prior to the posterior pedicle screw fixation.  The patient was admitted to the ICU where he has been stable.  As result the decision was made to take him back to the operating room today to complete the posterior fixation.  All appropriate risks benefits and alternatives were discussed with the patient prior to his initial surgery, and again with his wife prior to this surgery.  Operative report.  Patient was brought the operating room direct from the ICU.  The patient was already intubated.  Patient was turned prone onto the spine frame and all bony prominences well-padded.  The back was prepped and draped in a standard fashion.  Timeout was taken to confirm patient procedure and all other important data.  At this point using fluoroscopy identified the lateral borders of the L3 and L5 pedicles and infiltrated the area with quarter percent Marcaine with epinephrine.  Incisions were made at these levels and then the Jamshidi needle was advanced percutaneously to the lateral aspect of the pedicle.  Once I confirmed satisfactory position on the AP plane I advanced the Jamshidi needle into the pedicle.  I was using the AP fluoroscopy  confirming overall positioning of the Jamshidi needle and watching it advanced towards the medial wall of the pedicle.  In addition I was also directly stimulating the Jamshidi needle confirming that there was no adverse free running EMG activity to suggest breach.  Once I was nearing the medial wall of the pedicle on the AP view I switched to the lateral to confirm that I was just beyond the posterior wall of the vertebral body.  Once I confirm this I advanced the Jamshidi needle into the vertebral body.  A cannula was then placed and the Jamshidi needle removed.  I then repeated this exact same procedure at L5 and on the contralateral side.  Once all 4 pedicles were cannulated I then measured and then placed the appropriate size screw over the cannula into adequate.  I utilized 6.5 x 50 mm length screws at all 4 levels.  Once all 4 was replaced I directly stimulated and there is no adverse activity at greater than 40 mA.  At this point I then measured and obtain the appropriate size rod and passed percutaneously from the L3 level to the L5 level I then secured the top locking knots.  All 4 locking knots were then torqued off according manufacturer standards.  Final x-rays were taken which demonstrated satisfactory position of all 4 pedicle screws.  At this point irrigated the wounds copiously normal saline and closed each in a layered fashion with interrupted #1 Vicryl suture, 2-0 Vicryl suture, 3-0 Monocryl.  At the end of the case all needle sponge counts were correct.  Steri-Strips and dry dressings were applied.  The patient was transferred back to  the ICU where he will be weaned to extubation.

## 2018-06-13 NOTE — Plan of Care (Signed)
  Problem: Pain Managment: Goal: General experience of comfort will improve Outcome: Progressing   

## 2018-06-14 ENCOUNTER — Encounter (HOSPITAL_COMMUNITY): Payer: Self-pay | Admitting: Orthopedic Surgery

## 2018-06-14 DIAGNOSIS — J9601 Acute respiratory failure with hypoxia: Secondary | ICD-10-CM

## 2018-06-14 DIAGNOSIS — C3491 Malignant neoplasm of unspecified part of right bronchus or lung: Secondary | ICD-10-CM | POA: Diagnosis present

## 2018-06-14 DIAGNOSIS — C7951 Secondary malignant neoplasm of bone: Secondary | ICD-10-CM

## 2018-06-14 DIAGNOSIS — C3490 Malignant neoplasm of unspecified part of unspecified bronchus or lung: Secondary | ICD-10-CM | POA: Diagnosis present

## 2018-06-14 LAB — BASIC METABOLIC PANEL
Anion gap: 11 (ref 5–15)
BUN: 7 mg/dL — AB (ref 8–23)
CO2: 21 mmol/L — ABNORMAL LOW (ref 22–32)
Calcium: 6.7 mg/dL — ABNORMAL LOW (ref 8.9–10.3)
Chloride: 106 mmol/L (ref 98–111)
Creatinine, Ser: 0.71 mg/dL (ref 0.61–1.24)
GFR calc Af Amer: >60 mL/min (ref >60)
GFR calc non Af Amer: >60 mL/min (ref >60)
Glucose, Bld: 108 mg/dL — ABNORMAL HIGH (ref 70–99)
POTASSIUM: 3.5 mmol/L (ref 3.5–5.1)
Sodium: 138 mmol/L (ref 135–145)

## 2018-06-14 LAB — IRON AND TIBC
Iron: 18 ug/dL — ABNORMAL LOW (ref 45–182)
Saturation Ratios: 10 % — ABNORMAL LOW (ref 17.9–39.5)
TIBC: 188 ug/dL — ABNORMAL LOW (ref 250–450)
UIBC: 170 ug/dL

## 2018-06-14 LAB — CBC
HCT: 26.8 % — ABNORMAL LOW (ref 39.0–52.0)
Hemoglobin: 8.9 g/dL — ABNORMAL LOW (ref 13.0–17.0)
MCH: 31.2 pg (ref 26.0–34.0)
MCHC: 33.2 g/dL (ref 30.0–36.0)
MCV: 94 fL (ref 80.0–100.0)
Platelets: 205 10*3/uL (ref 150–400)
RBC: 2.85 MIL/uL — ABNORMAL LOW (ref 4.22–5.81)
RDW: 15.3 % (ref 11.5–15.5)
WBC: 15.3 10*3/uL — ABNORMAL HIGH (ref 4.0–10.5)
nRBC: 0 % (ref 0.0–0.2)

## 2018-06-14 LAB — GLUCOSE, CAPILLARY
GLUCOSE-CAPILLARY: 105 mg/dL — AB (ref 70–99)
Glucose-Capillary: 99 mg/dL (ref 70–99)
Glucose-Capillary: 123 mg/dL — ABNORMAL HIGH (ref 70–99)
Glucose-Capillary: 102 mg/dL — ABNORMAL HIGH (ref 70–99)

## 2018-06-14 LAB — APTT: aPTT: 29 seconds (ref 24–36)

## 2018-06-14 LAB — FERRITIN: Ferritin: 1184 ng/mL — ABNORMAL HIGH (ref 24–336)

## 2018-06-14 LAB — PROTIME-INR
INR: 1.12
Prothrombin Time: 14.4 seconds (ref 11.4–15.2)

## 2018-06-14 MED ORDER — FOLIC ACID 1 MG PO TABS
2.0000 mg | ORAL_TABLET | Freq: Every day | ORAL | Status: DC
  Administered 2018-06-15 – 2018-06-23 (×8): 2 mg via ORAL
  Filled 2018-06-14 (×9): qty 2

## 2018-06-14 MED ORDER — NALOXONE HCL 0.4 MG/ML IJ SOLN: 0.4000 mg | INTRAMUSCULAR | Status: DC | PRN

## 2018-06-14 MED ORDER — MORPHINE SULFATE (PF) 2 MG/ML IV SOLN
1.0000 mg | INTRAVENOUS | Status: DC | PRN
  Administered 2018-06-14: 1 mg via INTRAVENOUS

## 2018-06-14 MED ORDER — SODIUM CHLORIDE 0.9% FLUSH: 9.0000 mL | INTRAVENOUS | Status: DC | PRN

## 2018-06-14 MED ORDER — MORPHINE SULFATE (PF) 2 MG/ML IV SOLN
INTRAVENOUS | Status: AC
  Filled 2018-06-14: qty 1

## 2018-06-14 MED ORDER — MORPHINE SULFATE 2 MG/ML IV SOLN
INTRAVENOUS | Status: DC
  Administered 2018-06-14: 8 mg via INTRAVENOUS
  Administered 2018-06-14: 13:00:00 via INTRAVENOUS
  Administered 2018-06-14: 13.5 mg via INTRAVENOUS
  Filled 2018-06-14: qty 30

## 2018-06-14 MED ORDER — FERROUS SULFATE 325 (65 FE) MG PO TABS
325.0000 mg | ORAL_TABLET | Freq: Three times a day (TID) | ORAL | Status: DC
  Administered 2018-06-15: 325 mg via ORAL
  Filled 2018-06-14 (×3): qty 1

## 2018-06-14 MED ORDER — PANTOPRAZOLE SODIUM 40 MG PO TBEC: 40.0000 mg | DELAYED_RELEASE_TABLET | Freq: Every day | ORAL | Status: DC

## 2018-06-14 MED ORDER — DIPHENHYDRAMINE HCL 12.5 MG/5ML PO ELIX: 12.5000 mg | ORAL_SOLUTION | Freq: Four times a day (QID) | ORAL | Status: DC | PRN

## 2018-06-14 MED ORDER — POTASSIUM CHLORIDE 20 MEQ/15ML (10%) PO SOLN
20.0000 meq | ORAL | Status: AC
  Administered 2018-06-14 (×2): 20 meq
  Filled 2018-06-14 (×2): qty 15

## 2018-06-14 MED ORDER — DIPHENHYDRAMINE HCL 50 MG/ML IJ SOLN: 12.5000 mg | Freq: Four times a day (QID) | INTRAMUSCULAR | Status: DC | PRN

## 2018-06-14 MED ORDER — ORAL CARE MOUTH RINSE
15.0000 mL | Freq: Two times a day (BID) | OROMUCOSAL | Status: DC
  Administered 2018-06-14 – 2018-06-23 (×13): 15 mL via OROMUCOSAL

## 2018-06-14 NOTE — Progress Notes (Signed)
Pharmacy Antibiotic Note  Brian [name] is a 72 y.o. male admitted on 06/04/2018 s/p back surgery. Pharmacy has been consulted for Vancomycin dosing (day #4) for surgical prophylaxis. Pt with drain in place. Afebrile, WBC wnl. SCr stable.  Plan: Vancomycin 1gm IV q12h Will f/u micro data, renal function, and pt's clinical condition Vanc trough later today if continuing F/u drain removal   Height: 5\' 10"  (177.8 cm) Weight: 206 lb 12.7 oz (93.8 kg) IBW/kg (Calculated) : 73  Temp (24hrs), Avg:98.4 F (36.9 C), Min:98.2 F (36.8 C), Max:98.9 F (37.2 C)  Recent Labs  Lab 06/11/18 2326 06/12/18 0456 06/13/18 0544 06/13/18 1839 06/14/18 0538  WBC 13.7* 11.7* 14.8*  --  15.3*  CREATININE 0.69 0.82 0.77 0.62 0.71    Estimated Creatinine Clearance: 96 mL/min (by C-G formula based on SCr of 0.71 mg/dL).    Allergies  Allergen Reactions  . Minoxidil Palpitations  . Nicotine Palpitations  . Sulfamethoxazole Other (See Comments)    Both parents allergic, avoids med    Antimicrobials this admission: 12/20 Vancomycin >>   Microbiology results: 12/18 MRSA PCR: negative  Elicia Lamp, PharmD, BCPS Clinical Pharmacist Clinical phone [phone] Please check AMION for all [place] contact numbers 06/14/2018 9:29 AM

## 2018-06-14 NOTE — Progress Notes (Signed)
Initial Nutrition Assessment  DOCUMENTATION CODES:   Not applicable  INTERVENTION:   Discussed importance of nutrition post op.   Pt aggreeable to ensure once diet advanced.    NUTRITION DIAGNOSIS:   Increased nutrient needs related to cancer and cancer related treatments as evidenced by estimated needs.  GOAL:   Patient will meet greater than or equal to 90% of their needs  MONITOR:   PO intake, Supplement acceptance  REASON FOR ASSESSMENT:   Rounds    ASSESSMENT:   Pt with PMH of OA, chronic back pain, OA, COPD and RA who was admitted to [facility] on 12/13 with back pain. Pt found to have presumed stage IV lung cancer due to lumbar spine metastatic lesion s/p fusion.    Pt discussed during ICU rounds and with RN.  Per pt and his wife no recent weight changes. Usual body weight is 204 lb and good intake PTA. Pt has not been eating since Monday.    Medications reviewed and include: ferrous sulfate Labs reviewed    NUTRITION - FOCUSED PHYSICAL EXAM:    Most Recent Value  Orbital Region  No depletion  Upper Arm Region  No depletion  Thoracic and Lumbar Region  No depletion  Buccal Region  No depletion  Temple Region  No depletion  Clavicle Bone Region  No depletion  Clavicle and Acromion Bone Region  No depletion  Scapular Bone Region  No depletion  Dorsal Hand  Unable to assess  Patellar Region  No depletion  Anterior Thigh Region  No depletion  Posterior Calf Region  No depletion  Edema (RD Assessment)  Moderate  Hair  Reviewed  Eyes  Reviewed  Mouth  Reviewed  Skin  Reviewed  Nails  Reviewed       Diet Order:   Diet Order            Diet NPO time specified  Diet effective now              EDUCATION NEEDS:   Education needs have been addressed  Skin:  Skin Assessment: Reviewed RN Assessment  Last BM:  12/17  Height:   Ht Readings from Last 1 Encounters:  06/09/18 5\' 10"  (1.778 m)    Weight:   Wt Readings from Last 1 Encounters:   06/13/18 93.8 kg    Ideal Body Weight:  75.4 kg  BMI:  Body mass index is 29.67 kg/m.  Estimated Nutritional Needs:   Kcal:  2000-2300  Protein:  105-124 grams  Fluid:  > 2 L/day  Maylon Peppers RD, LDN, CNSC [phone] Pager [phone] After Hours Pager

## 2018-06-14 NOTE — Evaluation (Signed)
Physical Therapy Evaluation Patient Details Name: Brian [name] MRN: [id number] DOB: Oct 07, 1945 Today's Date: 06/14/2018   History of Present Illness  72 yo s/p L3-5 PLIF corpectomy metastic with lesion with primary stage III lung CA commanded by acute respiratory failure intubated from 12/20-12/23  PMH: emphysema, pathologic L4 fx, lung CA with mets to the bone, RA, abdominal aortic aneurysm  Clinical Impression  Patient is s/p above surgery resulting in the deficits listed below (see PT Problem List). Pt with significant back pain, just received IV pain pump. Attempted to stand x 3 however unable to achieve full upright posture due to weakness and pain. Patient will benefit from skilled PT to increase their independence and safety with mobility (while adhering to their precautions) to allow discharge to the venue listed below.     Follow Up Recommendations CIR;Supervision/Assistance - 24 hour    Equipment Recommendations  None recommended by PT(TBD)    Recommendations for Other Services Rehab consult     Precautions / Restrictions Precautions Precautions: Back Precaution Booklet Issued: No Precaution Comments: educated pt and spouse, will bring handout tomorrow Required Braces or Orthoses: Spinal Brace Spinal Brace: Thoracolumbosacral orthotic;Applied in sitting position(when amb) Restrictions Weight Bearing Restrictions: No      Mobility  Bed Mobility Overal bed mobility: Needs Assistance Bed Mobility: Rolling;Supine to Sit;Sit to Sidelying Rolling: Mod assist;+2 for physical assistance   Supine to sit: Max assist;+2 for physical assistance Sit to supine: Max assist;+2 for physical assistance Sit to sidelying: +2 for physical assistance General bed mobility comments: max directional verbal cues, maxA to maintain back precautions, LE management and trunk elevation  Transfers Overall transfer level: Needs assistance Equipment used: (2 person lift) Transfers: Sit  to/from Stand Sit to Stand: Max assist;+2 physical assistance         General transfer comment: pt with increased pain, attemptedx3 unable to achieve full upright standing due to back pain and bilat LE weakness, unable to achieve bilat knee extension, attempted std pvt however pt unable to advance LEs, attempted side stepping  however unable to step, slid feet   Ambulation/Gait             General Gait Details: unable  Stairs            Wheelchair Mobility    Modified Rankin (Stroke Patients Only)       Balance Overall balance assessment: Needs assistance Sitting-balance support: Feet supported;Bilateral upper extremity supported Sitting balance-Leahy Scale: Poor Sitting balance - Comments: dependent on bilat UE support due to increased back pain   Standing balance support: Bilateral upper extremity supported Standing balance-Leahy Scale: Poor Standing balance comment: dependent on physical assist                             Pertinent Vitals/Pain Pain Assessment: 0-10 Pain Score: 4  Pain Location: back Pain Descriptors / Indicators: Burning Pain Intervention(s): PCA encouraged    Home Living Family/patient expects to be discharged to:: Private residence Living Arrangements: Spouse/significant other Available Help at Discharge: Family;Available 24 hours/day Type of Home: House Home Access: Stairs to enter Entrance Stairs-Rails: Right Entrance Stairs-Number of Steps: 6 Home Layout: One level Home Equipment: Walker - 2 wheels;Grab bars - tub/shower;Shower seat;Hand held shower head;Adaptive equipment(frame for toilet that are just "handles" not a BSC) Additional Comments: two cats- within the home but 10 total in the house but are in a special room in the house  Prior Function Level of Independence: Needs assistance   Gait / Transfers Assistance Needed: walker at baseline  ADL's / Homemaking Assistance Needed: sitting with wife helping with  back and buttock        Hand Dominance   Dominant Hand: Right    Extremity/Trunk Assessment   Upper Extremity Assessment Upper Extremity Assessment: Overall WFL for tasks assessed    Lower Extremity Assessment Lower Extremity Assessment: RLE deficits/detail;LLE deficits/detail RLE Deficits / Details: increased radiating pain, able to initiate quad set and heel slides in bed but limited by pain LLE Deficits / Details: able to complete quad set, achieve heel slide to 45 degrees    Cervical / Trunk Assessment Cervical / Trunk Assessment: Other exceptions Cervical / Trunk Exceptions: back surgery  Communication   Communication: No difficulties  Cognition Arousal/Alertness: Awake/alert Behavior During Therapy: WFL for tasks assessed/performed Overall Cognitive Status: Within Functional Limits for tasks assessed                                        General Comments General comments (skin integrity, edema, etc.): RN removed drain, dressing at incision intact    Exercises General Exercises - Lower Extremity Ankle Circles/Pumps: AROM;Both;10 reps;Supine Quad Sets: AROM;Both;10 reps;Supine Gluteal Sets: AROM;Both;10 reps;Supine   Assessment/Plan    PT Assessment Patient needs continued PT services  PT Problem List Decreased strength;Decreased activity tolerance;Decreased range of motion;Decreased balance;Decreased mobility;Decreased coordination;Decreased cognition;Decreased knowledge of use of DME;Decreased safety awareness;Pain       PT Treatment Interventions      PT Goals (Current goals can be found in the Care Plan section)  Acute Rehab PT Goals Patient Stated Goal: stop the pain PT Goal Formulation: With patient Time For Goal Achievement: 06/28/18 Potential to Achieve Goals: Good    Frequency Min 5X/week   Barriers to discharge        Co-evaluation               AM-PAC PT "6 Clicks" Mobility  Outcome Measure Help needed turning  from your back to your side while in a flat bed without using bedrails?: A Lot Help needed moving from lying on your back to sitting on the side of a flat bed without using bedrails?: A Lot Help needed moving to and from a bed to a chair (including a wheelchair)?: A Lot Help needed standing up from a chair using your arms (e.g., wheelchair or bedside chair)?: A Lot Help needed to walk in hospital room?: Total Help needed climbing 3-5 steps with a railing? : Total 6 Click Score: 10    End of Session Equipment Utilized During Treatment: Gait belt;Oxygen Activity Tolerance: Patient limited by pain Patient left: in bed;with call bell/phone within reach;with family/visitor present Nurse Communication: Mobility status PT Visit Diagnosis: Unsteadiness on feet (R26.81);Pain;Difficulty in walking, not elsewhere classified (R26.2) Pain - Right/Left: (back, R LE) Pain - part of body: Leg    Time: 4709-6283 PT Time Calculation (min) (ACUTE ONLY): 31 min   Charges:   PT Evaluation $PT Eval High Complexity: 1 High          Kittie Plater, PT, DPT Acute Rehabilitation Services Pager #: [phone] Office #: [phone]   Berline Lopes 06/14/2018, 2:49 PM

## 2018-06-14 NOTE — Progress Notes (Signed)
Wasted 53ml of Morphine PCA syringe with [place], Therapist, sports.

## 2018-06-14 NOTE — Procedures (Signed)
Extubation Procedure Note  Patient Details:   Name: Brian [name] DOB: Jan 27, 1946 MRN: [id number]   Airway Documentation:    Vent end date: 06/14/18 Vent end time: 1000   Evaluation  O2 sats: stable throughout Complications: No apparent complications Patient did tolerate procedure well. Bilateral Breath Sounds: Clear   Yes  Sharla Kidney 06/14/2018, 10:06 AM

## 2018-06-14 NOTE — Progress Notes (Signed)
    Subjective: Procedure(s) (LRB): POSTERIOR SPINAL FUSION L3-L5 (N/A) 1 Day Post-Op  Patient reports pain as 6 on 0-10 scale.  Reports unchanged leg pain reports incisional back pain   Positive void Negative bowel movement Positive flatus Negative chest pain or shortness of breath  Objective: Vital signs in last 24 hours: Temp:  [98.2 F (36.8 C)-98.9 F (37.2 C)] 98.2 F (36.8 C) (12/23 1200) Pulse Rate:  [48-112] 94 (12/23 0800) Resp:  [10-21] 10 (12/23 0800) BP: (83-115)/(52-77) 109/61 (12/23 0800) SpO2:  [92 %-99 %] 93 % (12/23 1007) Arterial Line BP: (64-134)/(48-91) 99/91 (12/23 0800) FiO2 (%):  [30 %] 30 % (12/23 0800)  Intake/Output from previous day: 12/22 0701 - 12/23 0700 In: 4387.1 [I.V.:3200; IV Piggyback:1177.2] Out: 1240 [Urine:1125; Drains:65; Blood:50]  Labs: Recent Labs    06/13/18 0544 06/13/18 1215 06/14/18 0538  WBC 14.8*  --  15.3*  RBC 3.11*  --  2.85*  HCT 28.8* 22.0* 26.8*  PLT 181  --  205   Recent Labs    06/13/18 1839 06/14/18 0538  NA 134* 138  K 3.7 3.5  CL 104 106  CO2 22 21*  BUN 7* 7*  CREATININE 0.62 0.71  GLUCOSE 144* 108*  CALCIUM 6.7* 6.7*   Recent Labs    06/12/18 0456 06/14/18 0538  INR 1.16 1.12    Physical Exam: ABD soft Intact pulses distally Incision: dressing C/D/I Compartment soft minimal drain output noted Body mass index is 29.67 kg/m.  Right thigh numbness remains Generalized LE weakness persists   Assessment/Plan: Patient stable  xrays n/a Continue mobilization with physical therapy Continue care  Advance diet Up with therapy  1. Patient now extubated - hemodynamically stable 2. PCA for pain control while in ICU 3. Mobilization with PT is critical since he has been essentially bedridden for past 2 weeks 4. Will request thoracic surgery consult given right lung mass 5. May also need CIR consult - will see whta is recommended after PT/OT eval 6. D/c drain today 7. Post-operative  anemia noted.  Will start iron supplements.  No indication for transfusion at this time  Melina Schools, MD [facility] [phone]

## 2018-06-14 NOTE — Progress Notes (Signed)
[facility] ADULT ICU REPLACEMENT PROTOCOL FOR AM LAB REPLACEMENT ONLY  The patient does apply for the [facility] Adult ICU Electrolyte Replacment Protocol based on the criteria listed below:   1. Is GFR >/= 40 ml/min? Yes.    Patient's GFR today is >60 2. Is urine output >/= 0.5 ml/kg/hr for the last 6 hours? Yes.   Patient's UOP is 0.6 ml/kg/hr 3. Is BUN < 60 mg/dL? Yes.    Patient's BUN today is 7  4. Abnormal electrolyte(s): k 3.5 5. Ordered repletion with: protocol 6. If a panic level lab has been reported, has the CCM MD in charge been notified? No..   Physician:    Ronda Fairly A 06/14/2018 6:42 AM

## 2018-06-14 NOTE — Progress Notes (Signed)
OT Cancellation Note  Patient Details Name: Tyrail Grandfield MRN: [id number] DOB: June 02, 1946   Cancelled Treatment:    Reason Eval/Treat Not Completed: Patient not medically ready(pending extubation RN request to hold for now) Rn to notifiy therapy when patient is ready to participate if appropriate today. OT to hold for most appropriate time after discussion with RN  Richelle Ito, OTR/L  Acute Rehabilitation Services Pager: [phone] Office: [phone] .  06/14/2018, 8:05 AM

## 2018-06-14 NOTE — Progress Notes (Signed)
Brian Soho, MD called and notified that patient has not voided since Foley Catheter was removed at 1000. Bladder scans have been less than 400, but last one said >360. Verbal orders for in and out cath once, and then wait again til in the AM.

## 2018-06-14 NOTE — Progress Notes (Signed)
PT Cancellation Note  Patient Details Name: Nichols Corter MRN: [id number] DOB: [birth year]   Cancelled Treatment:    Reason Eval/Treat Not Completed: Other (comment). Pt being prepped for extubation. PT to return as able, as appropriate to complete PT eval.  Kittie Plater, PT, DPT Acute Rehabilitation Services Pager #: [phone] Office #: [phone]    [place] 06/14/2018, 10:00 AM

## 2018-06-14 NOTE — Progress Notes (Signed)
NAME:  Brian [name], MRN:  [id number], DOB:  [birth year], LOS: 40 ADMISSION DATE:  06/04/2018, CONSULTATION DATE:  06/11/2018 REFERRING MD:  Dr. Rolena Infante, CHIEF COMPLAINT:   Ventilator management  History of present illness   72 year old man with history of RA, emphysema, tobacco use admitted on 06/04/2018 for lower back pain found to have pathologic L4 fracture and CT chest with right lower lobe mass concerning for bronchogenic carcinoma with metastasis to bone. He underwent L4 Corpectomy, L3-5 anterior reconstruction with ortho on 06/11/2018. During the procedure he received 4 units of pRBC and 2 units of FFP.   Past Medical History  Rheumatoid arthritis, emphysema, tobacco use   Significant Hospital Events   12/13 admission 12/20 L4 Corpectomy, L3-5 anterior reconstruction 06/11/2018 intubated during surgery  Procedures:  12/20 L4 Corpectomy, L3-5 anterior reconstruction 06/11/2018 intubation>> 06/11/2018 right radial A-line>> 06/11/2018 Hemovac drains lumbar area>>  Significant Diagnostic Tests:  CTA abd/pelvis on 12/20 retroperitoneal hematoma seen  CXR on 12/20: Endotracheal tube tip is 8.2 cm above the base the carina. Known right lower lobe mass projects over the right hilum. Chronic interstitial lung disease. Atelectasis or infiltrate at the right base.  CT chest/abd/pelvis w/ contrast on 12/13: Right lower lobe mass lesion. Destructive lesions in the left clavicle, right seventh ribs and L4 vertebral body. Small mediastinal lymph nodes measuring up to 1.5 cm. Two lesions in the liver. Advanced emphysema. Calcific aortic and coronary atherosclerosis. 3.3 cm abdominal aortic aneurysm is identified. Gallstones without evidence of cholecystitis. Cystic lesion in the subcutaneous tissues of the anterior left chest wall. Simple lipoma right gluteal musculature.  MR brain w/ w/o contrast on 12/16 No evidence of metastatic disease. Moderate chronic small-vessel ischemic changes     NM bone scan 12/16 Abnormal uptake is seen in midportion of right rib, L4 vertebral body and anterior portions of left first rib and left clavicle  MR lumbar spine 12/13 Metastatic lesion of the L4 vertebral body with associated pathologic fracture and posterior bulging of the vertebral body, overlying epidural thickening, severe spinal canal stenosis and severe right neural foraminal stenosis. Moderate right L5-S1 neural foraminal stenosis  EKG 12/16 with sinus bradycardia, no previous for comparison  Micro Data:  MRSA screen neg on 12/18  Antimicrobials:  Vanc x 1 on 12/20 06/11/2018 vancomycin>> Interim history/subjective:  06/14/18 Vent weaned to PSV/CPAP. Propofol off, patient SBPs improved. Awake and alert. Wife at bedside, spoke with both about plan to extubate patient.   Objective   Blood pressure 104/77, pulse 90, temperature 98.9 F (37.2 C), temperature source Axillary, resp. rate 15, height 5\' 10"  (1.778 m), weight 93.8 kg, SpO2 97 %.    Vent Mode: CPAP;PSV FiO2 (%):  [30 %] 30 % Set Rate:  [15 bmp] 15 bmp Vt Set:  [540 mL-580 mL] 540 mL PEEP:  [5 cmH20] 5 cmH20 Pressure Support:  [5 cmH20] 5 cmH20 Plateau Pressure:  [13 cmH20-16 cmH20] 13 cmH20   Intake/Output Summary (Last 24 hours) at 06/14/2018 0842 Last data filed at 06/14/2018 0700 Gross per 24 hour  Intake 4047.33 ml  Output 1115 ml  Net 2932.33 ml   Filed Weights   06/11/18 2000 06/12/18 0500 06/13/18 0500  Weight: 92.1 kg 93 kg 93.8 kg    Examination: General: WDWN older adult male in no acute distress.  HEENT: Endotracheal tube orogastric tube in place Neuro: Awake, alert. PERRL. Follows commands, nods/shakes head to questions appropriately.  CV: s1s2 appreciated, no r/g/m. PULM:  ETT present. No adventitious  lung sounds.  GI: soft, round, non-tender. Normoactive bowel sounds x 4 quadrants. Extremities: 2 point soft restraints BUE. 2+ radial pulses bilaterally, 1+ pedal pulses  bilaterally Skin: clean, dry, warm and without rashes or lesion. Hemovac remains appropriately placed.   Assessment & Plan:  72 year old man with history of RA, emphysema, tobacco use admitted on 06/04/2018 here with pathologic L4 fracture and right lower lobe mass concerning for bronchogenic carcinoma with metastasis to bone now s/p L4 Corpectomy, L3-5 anterior reconstruction with ortho on 06/11/2018  Acute hypercarbic respiratory failure: post-operative. Current settings: CPAP/PSV PS 5 PEEP 5 30%FiO2 Plan -Plan to extubate 06/14/18 -Wean O2 per protocol -ABGs PRN   Right lung mass  -questionable bronchogenic carcinoma with metastasis to the bone Plan -Outpatient follow up  Hypotension  -Hypotension associated with increased sedation  -Given recent surgery however, must also consider possibility of post-op bleed -12/23 propofol off in anticipation of extubation Plan -Wean neosynephrine for MAPs > 65 -PO pain regimen favored over IV fentanyl -Monitor MAPs via arterial line, can likely dc art line 06/14/18     Acute blood loss anemia: Received 4 units of pRBC and 2 units of FFP in OR 12/20.  Recent Labs    06/13/18 1215 06/14/18 0538  HGB 7.5* 8.9*    -Monitor CBC qD -Monitor hemovac output for increased output -Interventional radiology is on standby for any bleeding issues  L4 fracture: s/p L4 Corpectomy, L3-5 anterior reconstruction with ortho on 06/11/2018, s/p return OR 06/13/18 Plan -Neuro exam qshift  Post-operative pain S/p L4 corpectomy L3-5 reconstruction Plan Patient currently PO oxycodone and IV fentanyl for acute pain with chronic pain reg detailed below -Consider PCA for patient when extubated and restraints dc'd   RA, chronic pain: On dilaudid PO at home. Was previously on methotrexate. Had 3 weeklong pred tapers prior to hospitalization. -Currently holding home Dilaudid  -Patient receiving home gabapentin, robaxin as well as new oxycodone and  fentanyl -Hold methotrexate  Emphysema, possible COPD:  -Bronchodilators  Abdominal aortic aneurysm: -We will need outpatient follow-up   Inadequate PO intake Patient has been NPO x several days Plan Post-extubation speech/swallow Advance diet as tolerated   Best practice:  Diet: NPO if not extubated within 24 hours start tube feedings Pain/Anxiety/Delirium protocol (if indicated): Fentanyl and propofol drips VAP protocol (if indicated): Peridex BID and rest of protocol ordered DVT prophylaxis: SCDs GI prophylaxis: pepcid Glucose control: normoglycemic Mobility: PT with precautions per ortho Code Status: FULL Family Communication: wife at bedside Disposition: ICU level of care as patient is intubated, requiring intermittent pressors  Labs   CBC: Recent Labs  Lab 06/11/18 2326 06/12/18 0456 06/13/18 0544 06/13/18 1215 06/14/18 0538  WBC 13.7* 11.7* 14.8*  --  15.3*  NEUTROABS  --   --  12.1*  --   --   HGB 11.2* 10.8* 9.7* 7.5* 8.9*  HCT 33.5* 32.3* 28.8* 22.0* 26.8*  MCV 89.1 88.7 92.6  --  94.0  PLT 181 185 181  --  220    Basic Metabolic Panel: Recent Labs  Lab 06/11/18 2326 06/12/18 0456 06/13/18 0544 06/13/18 1215 06/13/18 1839 06/14/18 0538  NA 138 137 138 139 134* 138  K 4.0 3.5 3.2* 3.2* 3.7 3.5  CL 110 107 108  --  104 106  CO2 20* 20* 21*  --  22 21*  GLUCOSE 121* 107* 113*  --  144* 108*  BUN 13 12 8   --  7* 7*  CREATININE 0.69 0.82 0.77  --  0.62 0.71  CALCIUM 7.6* 7.3* 7.0*  --  6.7* 6.7*  MG  --   --  2.1  --  2.2  --   PHOS  --   --  2.3*  --  3.0  --    GFR: Estimated Creatinine Clearance: 96 mL/min (by C-G formula based on SCr of 0.71 mg/dL). Recent Labs  Lab 06/11/18 2326 06/12/18 0456 06/13/18 0544 06/14/18 0538  WBC 13.7* 11.7* 14.8* 15.3*    Liver Function Tests: No results for input(s): AST, ALT, ALKPHOS, BILITOT, PROT, ALBUMIN in the last 168 hours.  ABG    Component Value Date/Time   PHART 7.383 06/13/2018 1215    PCO2ART 38.1 06/13/2018 1215   PO2ART 338.0 (H) 06/13/2018 1215   HCO3 22.7 06/13/2018 1215   TCO2 24 06/13/2018 1215   ACIDBASEDEF 2.0 06/13/2018 1215   O2SAT 100.0 06/13/2018 1215     Critical Care time 30 minutes  Eliseo Gum AGACNP-BC Pulmonary Critical Care Medicine 06/14/2018, 8:42 AM

## 2018-06-14 NOTE — Evaluation (Signed)
Occupational Therapy Evaluation Patient Details Name: Brian [name] MRN: [id number] DOB: Apr 19, 1946 Today's Date: 06/14/2018    History of Present Illness 72 yo s/p L3-5 PLIF corpectomy metastic with lesion with primary stage III lung CA commanded by acute respiratory failure intubated from 12/20-12/23  PMH: emphysema, pathologic L4 fx, lung CA with mets to the bone, RA, abdominal aortic aneurysm   Clinical Impression   Patient is s/p L3-5 corepectomy surgery resulting in functional limitations due to the deficits listed below (see OT problem list). Pt currently total +2 max (A) for sit<>stand at EOB. Pt able to sit<>Stand x3 but unable to progress to chair this session. Recommend RN use the bed to chair position x3 and EOB dang x2 during shift.  Patient will benefit from skilled OT acutely to increase independence and safety with ADLS to allow discharge CIR.     Follow Up Recommendations  CIR    Equipment Recommendations  3 in 1 bedside commode;Other (comment)(RW)    Recommendations for Other Services Rehab consult     Precautions / Restrictions Precautions Precautions: Back Precaution Booklet Issued: No Precaution Comments: educated pt and spouse, will bring handout tomorrow Required Braces or Orthoses: Spinal Brace Spinal Brace: Thoracolumbosacral orthotic;Applied in sitting position(when amb) Restrictions Weight Bearing Restrictions: No      Mobility Bed Mobility Overal bed mobility: Needs Assistance Bed Mobility: Rolling;Supine to Sit;Sit to Sidelying Rolling: Mod assist;+2 for physical assistance   Supine to sit: Max assist;+2 for physical assistance Sit to supine: Max assist;+2 for physical assistance Sit to sidelying: +2 for physical assistance General bed mobility comments: max directional verbal cues, maxA to maintain back precautions, LE management and trunk elevation  Transfers Overall transfer level: Needs assistance Equipment used: (2 person  lift) Transfers: Sit to/from Stand Sit to Stand: Max assist;+2 physical assistance         General transfer comment: pt with increased pain, attemptedx3 unable to achieve full upright standing due to back pain and bilat LE weakness, unable to achieve bilat knee extension, attempted std pvt however pt unable to advance LEs, attempted side stepping  however unable to step, slid feet     Balance Overall balance assessment: Needs assistance Sitting-balance support: Feet supported;Bilateral upper extremity supported Sitting balance-Leahy Scale: Poor Sitting balance - Comments: dependent on bilat UE support due to increased back pain   Standing balance support: Bilateral upper extremity supported Standing balance-Leahy Scale: Poor Standing balance comment: dependent on physical assist                           ADL either performed or assessed with clinical judgement   ADL Overall ADL's : Needs assistance/impaired Eating/Feeding: Minimal assistance;Bed level   Grooming: Wash/dry hands;Wash/dry face;Minimal assistance;Bed level   Upper Body Bathing: Moderate assistance   Lower Body Bathing: Total assistance   Upper Body Dressing : Maximal assistance   Lower Body Dressing: Total assistance   Toilet Transfer: +2 for physical assistance;Maximal assistance;BSC Toilet Transfer Details (indicate cue type and reason): elevated surface required and unable to sustain static standing. simulated sit<.stand eob. Recommend BSC placed behind patient with bed swung away Toileting- Clothing Manipulation and Hygiene: Total assistance;+2 for physical assistance         General ADL Comments: pt completed EOB sitting and sit<>Stand x3 this session wiht ability to clear buttock x2     Vision Baseline Vision/History: Wears glasses Wears Glasses: At all times  Perception     Praxis      Pertinent Vitals/Pain Pain Assessment: 0-10 Pain Score: 4  Pain Location: back Pain  Descriptors / Indicators: Burning Pain Intervention(s): PCA encouraged     Hand Dominance Right   Extremity/Trunk Assessment Upper Extremity Assessment Upper Extremity Assessment: Overall WFL for tasks assessed   Lower Extremity Assessment Lower Extremity Assessment: Defer to PT evaluation RLE Deficits / Details: increased radiating pain, able to initiate quad set and heel slides in bed but limited by pain LLE Deficits / Details: able to complete quad set, achieve heel slide to 45 degrees   Cervical / Trunk Assessment Cervical / Trunk Assessment: Other exceptions Cervical / Trunk Exceptions: back surgery   Communication Communication Communication: No difficulties   Cognition Arousal/Alertness: Awake/alert Behavior During Therapy: WFL for tasks assessed/performed Overall Cognitive Status: Within Functional Limits for tasks assessed                                     General Comments  all dressings intact and dry.     Exercises Exercises: General Lower Extremity General Exercises - Lower Extremity Ankle Circles/Pumps: AROM;Both;10 reps;Supine Quad Sets: AROM;Both;10 reps;Supine Gluteal Sets: AROM;Both;10 reps;Supine   Shoulder Instructions      Home Living Family/patient expects to be discharged to:: Private residence Living Arrangements: Spouse/significant other Available Help at Discharge: Family;Available 24 hours/day Type of Home: House Home Access: Stairs to enter [employer] of Steps: 6 Entrance Stairs-Rails: Right Home Layout: One level     Bathroom Shower/Tub: Occupational psychologist: Standard     Home Equipment: Environmental consultant - 2 wheels;Grab bars - tub/shower;Shower seat;Hand held shower head;Adaptive equipment(frame for toilet that are just "handles" not a BSC) Adaptive Equipment: Reacher Additional Comments: two cats- within the home but 10 total in the house but are in a special room in the house      Prior  Functioning/Environment Level of Independence: Needs assistance  Gait / Transfers Assistance Needed: walker at baseline ADL's / Homemaking Assistance Needed: sitting with wife helping with back and buttock            OT Problem List: Decreased strength;Decreased range of motion;Decreased activity tolerance;Impaired balance (sitting and/or standing);Decreased safety awareness;Decreased knowledge of use of DME or AE;Decreased knowledge of precautions;Cardiopulmonary status limiting activity;Pain;Decreased coordination      OT Treatment/Interventions: Self-care/ADL training;Therapeutic exercise;Neuromuscular education;Energy conservation;DME and/or AE instruction;Manual therapy;Modalities;Therapeutic activities;Patient/family education;Balance training    OT Goals(Current goals can be found in the care plan section) Acute Rehab OT Goals Patient Stated Goal: stop the pain OT Goal Formulation: With patient/family Time For Goal Achievement: 06/28/18 Potential to Achieve Goals: Good ADL Goals Pt Will Perform Grooming: with set-up;sitting Pt Will Perform Upper Body Bathing: with min assist;sitting Pt Will Transfer to Toilet: with max assist;bedside commode;stand pivot transfer Additional ADL Goal #1: pt will verbalize back precautions without cues mod I  OT Frequency: Min 3X/week   Barriers to D/C:            Co-evaluation PT/OT/SLP Co-Evaluation/Treatment: Yes Reason for Co-Treatment: Complexity of the patient's impairments (multi-system involvement);For patient/therapist safety;Necessary to address cognition/behavior during functional activity;To address functional/ADL transfers   OT goals addressed during session: ADL's and self-care;Proper use of Adaptive equipment and DME;Strengthening/ROM      AM-PAC OT "6 Clicks" Daily Activity     Outcome Measure Help from another person eating meals?: A Little Help from another  person taking care of personal grooming?: A Lot Help from  another person toileting, which includes using toliet, bedpan, or urinal?: A Lot Help from another person bathing (including washing, rinsing, drying)?: A Lot Help from another person to put on and taking off regular upper body clothing?: A Lot Help from another person to put on and taking off regular lower body clothing?: Total 6 Click Score: 12   End of Session Equipment Utilized During Treatment: Oxygen(decr to 88% RA) Nurse Communication: Mobility status;Precautions  Activity Tolerance: Patient tolerated treatment well Patient left: in bed;with call bell/phone within reach;with bed alarm set;with family/visitor present;with SCD's reapplied  OT Visit Diagnosis: Unsteadiness on feet (R26.81);Muscle weakness (generalized) (M62.81)                Time: 0277-4128 OT Time Calculation (min): 37 min Charges:  OT General Charges $OT Visit: 1 Visit OT Evaluation $OT Eval High Complexity: 1 High   Jeri Modena, OTR/L  Acute Rehabilitation Services Pager: [phone] Office: [phone] .   Jeri Modena 06/14/2018, 2:54 PM

## 2018-06-14 NOTE — Progress Notes (Signed)
Rehab Admissions Coordinator Note:  Patient was screened by Cleatrice Burke for appropriateness for an Inpatient Acute Rehab Consult per PT recommendations. .  At this time, we are recommending Inpatient Rehab consult.  Danne Baxter, RN, MSN Rehab Admissions Coordinator [phone] 06/14/2018 2:56 PM

## 2018-06-15 ENCOUNTER — Encounter: Payer: Self-pay | Admitting: *Deleted

## 2018-06-15 DIAGNOSIS — C7951 Secondary malignant neoplasm of bone: Principal | ICD-10-CM

## 2018-06-15 LAB — TYPE AND SCREEN
ABO/RH(D): O NEG
Antibody Screen: NEGATIVE
UNIT DIVISION: 0
Unit division: 0
Unit division: 0
Unit division: 0
Unit division: 0
Unit division: 0
Unit division: 0
Unit division: 0
Unit division: 0
Unit division: 0

## 2018-06-15 LAB — CBC
HCT: 24.4 % — ABNORMAL LOW (ref 39.0–52.0)
Hemoglobin: 7.8 g/dL — ABNORMAL LOW (ref 13.0–17.0)
MCH: 30.7 pg (ref 26.0–34.0)
MCHC: 32 g/dL (ref 30.0–36.0)
MCV: 96.1 fL (ref 80.0–100.0)
Platelets: 224 10*3/uL (ref 150–400)
RBC: 2.54 MIL/uL — ABNORMAL LOW (ref 4.22–5.81)
RDW: 15.3 % (ref 11.5–15.5)
WBC: 11.3 10*3/uL — ABNORMAL HIGH (ref 4.0–10.5)
nRBC: 0 % (ref 0.0–0.2)

## 2018-06-15 LAB — BPAM RBC
BLOOD PRODUCT EXPIRATION DATE: 201912282359
BLOOD PRODUCT EXPIRATION DATE: 202001142359
Blood Product Expiration Date: 202001142359
Blood Product Expiration Date: 202001142359
Blood Product Expiration Date: 202001142359
Blood Product Expiration Date: 202001142359
Blood Product Expiration Date: 202001142359
Blood Product Expiration Date: 202001142359
Blood Product Expiration Date: 202001152359
Blood Product Expiration Date: 202001152359
ISSUE DATE / TIME: 201912201410
ISSUE DATE / TIME: 201912201410
ISSUE DATE / TIME: 201912201716
ISSUE DATE / TIME: 201912201716
ISSUE DATE / TIME: 201912201756
ISSUE DATE / TIME: 201912201756
ISSUE DATE / TIME: 201912201756
ISSUE DATE / TIME: 201912211423
UNIT TYPE AND RH: 5100
UNIT TYPE AND RH: 5100
Unit Type and Rh: 5100
Unit Type and Rh: 5100
Unit Type and Rh: 5100
Unit Type and Rh: 5100
Unit Type and Rh: 5100
Unit Type and Rh: 5100
Unit Type and Rh: 5100
Unit Type and Rh: 5100

## 2018-06-15 LAB — BASIC METABOLIC PANEL
Anion gap: 7 (ref 5–15)
BUN: 8 mg/dL (ref 8–23)
CO2: 26 mmol/L (ref 22–32)
Calcium: 6.9 mg/dL — ABNORMAL LOW (ref 8.9–10.3)
Chloride: 107 mmol/L (ref 98–111)
Creatinine, Ser: 0.82 mg/dL (ref 0.61–1.24)
GFR calc non Af Amer: >60 mL/min (ref >60)
Glucose, Bld: 97 mg/dL (ref 70–99)
Potassium: 4.6 mmol/L (ref 3.5–5.1)
Sodium: 140 mmol/L (ref 135–145)

## 2018-06-15 MED ORDER — ENSURE ENLIVE PO LIQD
237.0000 mL | Freq: Two times a day (BID) | ORAL | Status: DC
  Administered 2018-06-15 – 2018-06-29 (×15): 237 mL via ORAL

## 2018-06-15 MED ORDER — SODIUM CHLORIDE 0.9 % IV SOLN
510.0000 mg | Freq: Once | INTRAVENOUS | Status: AC
  Administered 2018-06-15: 510 mg via INTRAVENOUS
  Filled 2018-06-15: qty 17

## 2018-06-15 MED ORDER — FLEET ENEMA 7-19 GM/118ML RE ENEM: 1.0000 | ENEMA | Freq: Once | RECTAL | Status: DC

## 2018-06-15 MED ORDER — SODIUM CHLORIDE 0.9 % IV SOLN
8.0000 mg | Freq: Four times a day (QID) | INTRAVENOUS | Status: DC | PRN
  Filled 2018-06-15 (×2): qty 4

## 2018-06-15 MED ORDER — HYDROMORPHONE HCL 1 MG/ML PO LIQD
1.0000 mg | ORAL | Status: DC | PRN
  Administered 2018-06-17 – 2018-06-24 (×39): 1 mg via ORAL
  Filled 2018-06-15 (×41): qty 1

## 2018-06-15 MED ORDER — FAMOTIDINE IN NACL 20-0.9 MG/50ML-% IV SOLN
20.0000 mg | Freq: Two times a day (BID) | INTRAVENOUS | Status: DC
  Administered 2018-06-15 – 2018-06-17 (×4): 20 mg via INTRAVENOUS
  Filled 2018-06-15 (×4): qty 50

## 2018-06-15 MED ORDER — HYDROMORPHONE HCL 1 MG/ML IJ SOLN
0.5000 mg | INTRAMUSCULAR | Status: DC | PRN
  Administered 2018-06-15 – 2018-06-24 (×11): 0.5 mg via INTRAVENOUS
  Filled 2018-06-15 (×15): qty 1

## 2018-06-15 MED ORDER — ONDANSETRON HCL 4 MG/2ML IJ SOLN
INTRAMUSCULAR | Status: AC
  Administered 2018-06-15: 8 mg
  Filled 2018-06-15: qty 4

## 2018-06-15 MED ORDER — SORBITOL 70 % SOLN
960.0000 mL | TOPICAL_OIL | Freq: Once | ORAL | Status: AC
  Administered 2018-06-16: 960 mL via RECTAL
  Filled 2018-06-15: qty 473

## 2018-06-15 NOTE — Progress Notes (Signed)
    Subjective: Procedure(s) (LRB): POSTERIOR SPINAL FUSION L3-L5 (N/A) 2 Days Post-Op  Patient reports pain as 5 on 0-10 scale.  Reports decreased leg pain reports incisional back pain   Positive void Negative bowel movement Positive flatus Negative chest pain or shortness of breath  Objective: Vital signs in last 24 hours: Temp:  [97.5 F (36.4 C)-98.8 F (37.1 C)] 97.7 F (36.5 C) (12/24 1553) Pulse Rate:  [31-123] 81 (12/24 1553) Resp:  [12-27] 16 (12/24 1553) BP: (94-121)/(57-80) 115/57 (12/24 1553) SpO2:  [72 %-100 %] 99 % (12/24 1553)  Intake/Output from previous day: 12/23 0701 - 12/24 0700 In: 1729.4 [I.V.:1635.8; IV Piggyback:93.6] Out: 810 [Urine:800; Drains:10]  Labs: Recent Labs    06/14/18 0538 06/15/18 0214  WBC 15.3* 11.3*  RBC 2.85* 2.54*  HCT 26.8* 24.4*  PLT 205 224   Recent Labs    06/14/18 0538 06/15/18 0214  NA 138 140  K 3.5 4.6  CL 106 107  CO2 21* 26  BUN 7* 8  CREATININE 0.71 0.82  GLUCOSE 108* 97  CALCIUM 6.7* 6.9*   Recent Labs    06/14/18 0538  INR 1.12    Physical Exam: ABD soft Intact pulses distally Incision: dressing C/D/I and no drainage Compartment soft Neuro: generalized LE wekaness is noted.  no focal motor deficits noted.  Diffuse numbness in LE to light touch. Body mass index is 29.67 kg/m.   Assessment/Plan: Patient stable  xrays n/a Continue mobilization with physical therapy Continue care  1.  Patient with nausea with oral medications.  Will d/c norco and start liquid diluadid with IV for breakthorugh. 2. Enema for constipation 3. Hospitalist consult for further eval.  Question need for feeding tube if PO intake remains poor 4. RT consult for chest PT and other recommendations 5. Patient was OOB to chair today and felt numbness which is to be expected given the prolonged period of deconditioning.  Continue PT/mobilization 6. Start pepcid and increase zofran for nausea and emesis 7. Agree with IV  Iron as oral meds provoke nausea 8. DVT prevention: teds/scd's and lovenox 9. Possible transfusion if HCT continues to decrease (eval in the AM)  Melina Schools, MD [facility] [phone]

## 2018-06-15 NOTE — Progress Notes (Signed)
Brian [name] had his surgery last week.  He has successful L4 corpectomy.  He has had fusion of the lumbar spine.  He still is when I gotten out of bed.  He really is going to need quite a bit of physical therapy to try to get his performance status better.  His hemoglobin is only 7.8.  He is iron deficient.  I will give him some IV iron today.  He does have squamous cell carcinoma of the lung.  The pathology report shows this.  This is no surprise.  I am sending off the specimen for molecular profiling to see if there is any molecular mutation or if it can be treated with immunotherapy.  I really think that he would benefit from incentive spirometry.  I think that he is at significant risk for pneumonia as he is just bedbound right now.  I am not sure how good his appetite is.  Hopefully, he will be able to start eating a little bit better.  I suspect that he will need radiation therapy.  It is still too early for radiation therapy.  I will let radiation oncology know when the time is appropriate for them to be involved with his care.  His vital signs all look pretty stable.  His temperature is 98.7.  Pulse 88.  Blood pressure 102/65.  Head neck exam shows no ocular or oral lesions.  He has no adenopathy in the neck.  His lungs sound pretty clear bilaterally.  Cardiac exam regular rate and rhythm.  Abdomen is soft.  Extremities shows the sequential compression devices on.  He does move his legs without difficulty.  Skin exam shows no ecchymoses or petechia.  Neurological exam is nonfocal.  Of note, today is his birthday.  Hopefully, he will have a nice birthday.  It sounds like he will be going to some rehab.  Hopefully he will be going to inpatient rehab.  I think this would be optimal.   Lattie Haw, MD  [name] 9:6-7

## 2018-06-15 NOTE — Plan of Care (Signed)
  Problem: Education: Goal: Knowledge of General Education information will improve Description Including pain rating scale, medication(s)/side effects and non-pharmacologic comfort measures Outcome: Progressing   Problem: Health Behavior/Discharge Planning: Goal: Ability to manage health-related needs will improve Outcome: Progressing   Problem: Clinical Measurements: Goal: Ability to maintain clinical measurements within normal limits will improve Outcome: Progressing Goal: Will remain free from infection Outcome: Progressing Goal: Diagnostic test results will improve Outcome: Progressing Goal: Respiratory complications will improve Outcome: Progressing Goal: Cardiovascular complication will be avoided Outcome: Progressing   Problem: Activity: Goal: Risk for activity intolerance will decrease Outcome: Progressing   Problem: Nutrition: Goal: Adequate nutrition will be maintained Outcome: Progressing   Problem: Coping: Goal: Level of anxiety will decrease Outcome: Progressing   Problem: Elimination: Goal: Will not experience complications related to bowel motility Outcome: Progressing Goal: Will not experience complications related to urinary retention Outcome: Progressing   Problem: Pain Managment: Goal: General experience of comfort will improve Outcome: Progressing   Problem: Safety: Goal: Ability to remain free from injury will improve Outcome: Progressing   Problem: Skin Integrity: Goal: Risk for impaired skin integrity will decrease Outcome: Progressing   Problem: Education: Goal: Ability to verbalize activity precautions or restrictions will improve Outcome: Progressing Goal: Knowledge of the prescribed therapeutic regimen will improve Outcome: Progressing Goal: Understanding of discharge needs will improve Outcome: Progressing   Problem: Activity: Goal: Ability to avoid complications of mobility impairment will improve Outcome: Progressing Goal:  Ability to tolerate increased activity will improve Outcome: Progressing Goal: Will remain free from falls Outcome: Progressing   Problem: Bowel/Gastric: Goal: Gastrointestinal status for postoperative course will improve Outcome: Progressing   Problem: Clinical Measurements: Goal: Ability to maintain clinical measurements within normal limits will improve Outcome: Progressing Goal: Postoperative complications will be avoided or minimized Outcome: Progressing Goal: Diagnostic test results will improve Outcome: Progressing   Problem: Pain Management: Goal: Pain level will decrease Outcome: Progressing   Problem: Skin Integrity: Goal: Will show signs of wound healing Outcome: Progressing   Problem: Health Behavior/Discharge Planning: Goal: Identification of resources available to assist in meeting health care needs will improve Outcome: Progressing   Problem: Bladder/Genitourinary: Goal: Urinary functional status for postoperative course will improve Outcome: Progressing   Problem: Education: Goal: Knowledge of General Education information will improve Description Including pain rating scale, medication(s)/side effects and non-pharmacologic comfort measures Outcome: Progressing   Problem: Health Behavior/Discharge Planning: Goal: Ability to manage health-related needs will improve Outcome: Progressing   Problem: Clinical Measurements: Goal: Ability to maintain clinical measurements within normal limits will improve Outcome: Progressing Goal: Will remain free from infection Outcome: Progressing Goal: Diagnostic test results will improve Outcome: Progressing Goal: Respiratory complications will improve Outcome: Progressing Goal: Cardiovascular complication will be avoided Outcome: Progressing   Problem: Activity: Goal: Risk for activity intolerance will decrease Outcome: Progressing   Problem: Nutrition: Goal: Adequate nutrition will be maintained Outcome:  Progressing   Problem: Coping: Goal: Level of anxiety will decrease Outcome: Progressing   Problem: Elimination: Goal: Will not experience complications related to bowel motility Outcome: Progressing Goal: Will not experience complications related to urinary retention Outcome: Progressing   Problem: Pain Managment: Goal: General experience of comfort will improve Outcome: Progressing   Problem: Safety: Goal: Ability to remain free from injury will improve Outcome: Progressing   Problem: Skin Integrity: Goal: Risk for impaired skin integrity will decrease Outcome: Progressing

## 2018-06-15 NOTE — Consult Note (Signed)
Medical Consultation   Brian [name]  DJM:[id number]  DOB: [birth year]  DOA: 06/04/2018  PCP: [name], MD   Outpatient Specialists: Rosanne Gutting [facility] Lighter R)  Requesting physician: Trenton Gammon (orthopedics)  Reason for consultation: refractory nausea and emesis post-op on 12/22  History of Present Illness: Brian [name] is an 72 y.o. male who underwent LV corpectomy on 06/11/18 then posterior L3-L5 spinal fusion on 06/13/18 for pathologic L4 fracture in setting of metastatic bronchogenic CA. Since his last surgeries patient has been having general malaise and weakness and severe nausea and non bloody, occasionally bilious emesis for the past 2 days. Some upper epigastric discomfort with heaving during emesis episodes. No hematemesis. Patient also reports scratchy discomfort in the back of his throat, constant, over the past few days. Unable to tolerate any PO food or medications today. Reports nausea Pain managed with PO norco and IV dilaudid. Per [facility] he has received 39m IV zofran last evening around 6pm. Reports last bowel movement was about 4 days ago.     Review of Systems:  ROS  + severe nausea and emesis  + unable to tolerate PO food + generalized weakness - no fevers/chills - no chest pain - no rhinorrhea/URI sx - no urinary sx - no headache As per HPI otherwise 10 point review of systems negative.    Past Medical History: Past Medical History:  Diagnosis Date  . Arthritis     Past Surgical History: Past Surgical History:  Procedure Laterality Date  . ANTERIOR CERVICAL CORPECTOMY Left 06/11/2018   Procedure: L4 Corpectomy, L3-5 anterior reconstruction,;  Surgeon: BMelina Schools MD;  Location: [facility]  Service: Orthopedics;  Laterality: Left;  6.5 hrs- Ok for this day/time per April  . CATARACT EXTRACTION, BILATERAL       Allergies:   Allergies  Allergen Reactions  . Minoxidil Palpitations  . Nicotine Palpitations  .  Sulfamethoxazole Other (See Comments)    Both parents allergic, avoids med     Social History:  reports that he has been smoking cigarettes. He has been smoking about 0.25 packs per day. He has never used smokeless tobacco. He reports that he does not use drugs. No history on file for alcohol.   Family History: History reviewed. No pertinent family history.  Unacceptable: Noncontributory, unremarkable, or negative. Acceptable: Family history reviewed and not pertinent (If you reviewed it)   Physical Exam: Vitals:   06/15/18 0400 06/15/18 0813 06/15/18 1203 06/15/18 1553  BP: 102/65 99/80 (!) 107/59 (!) 115/57  Pulse: 88 88 88 81  Resp: 13 (!) _0 Temp: 98.7 F (37.1 C) 98.8 F (37.1 C) 98.1 F (36.7 C) 97.7 F (36.5 C)  TempSrc: Oral Oral Oral Oral  SpO2: 93% 98% 96% 99%  Weight:      Height:        Constitutional: Elderly caucasian male,  Alert and awake, oriented x3, not in any acute distress. Appears chronically ill Eyes: PERLA, EOMI, irises appear normal, anicteric sclera,  ENMT: external ears and nose appear normal            Lips appears normal, oropharynx mucosa, tongue, posterior pharynx appear normal  Neck: neck appears normal, no masses, normal ROM, no thyromegaly, no JVD  CVS: S1-S2 clear, no murmur rubs or gallops, no LE edema, normal pedal pulses  Respiratory:  clear to auscultation bilaterally, no wheezing, rales or rhonchi. Respiratory effort  normal. No accessory muscle use.  Abdomen: soft nontender, nondistended, normal bowel sounds, no hepatosplenomegaly, no hernias  Musculoskeletal: : no cyanosis, clubbing or edema noted bilaterally Neuro:  Lower extremity weakness 4/5 bilateral and sensory deficit to gross touch  Psych: judgement and insight appear normal, stable mood and affect, mental status Skin: no rashes or lesions or ulcers, no induration or nodules   Data reviewed:  I have personally reviewed following labs and imaging studies Labs:    CBC: Recent Labs  Lab 06/11/18 2326 06/12/18 0456 06/13/18 0544 06/13/18 1215 06/14/18 0538 06/15/18 0214  WBC 13.7* 11.7* 14.8*  --  15.3* 11.3*  NEUTROABS  --   --  12.1*  --   --   --   HGB 11.2* 10.8* 9.7* 7.5* 8.9* 7.8*  HCT 33.5* 32.3* 28.8* 22.0* 26.8* 24.4*  MCV 89.1 88.7 92.6  --  94.0 96.1  PLT 181 185 181  --  205 573    Basic Metabolic Panel: Recent Labs  Lab 06/12/18 0456 06/13/18 0544 06/13/18 1215 06/13/18 1839 06/14/18 0538 06/15/18 0214  NA 137 138 139 134* 138 140  K 3.5 3.2* 3.2* 3.7 3.5 4.6  CL 107 108  --  104 106 107  CO2 20* 21*  --  22 21* 26  GLUCOSE 107* 113*  --  144* 108* 97  BUN 12 8  --  7* 7* 8  CREATININE 0.82 0.77  --  0.62 0.71 0.82  CALCIUM 7.3* 7.0*  --  6.7* 6.7* 6.9*  MG  --  2.1  --  2.2  --   --   PHOS  --  2.3*  --  3.0  --   --    GFR Estimated Creatinine Clearance: 92.3 mL/min (by C-G formula based on SCr of 0.82 mg/dL). Liver Function Tests: No results for input(s): AST, ALT, ALKPHOS, BILITOT, PROT, ALBUMIN in the last 168 hours. No results for input(s): LIPASE, AMYLASE in the last 168 hours. No results for input(s): AMMONIA in the last 168 hours. Coagulation profile Recent Labs  Lab 06/12/18 0456 06/14/18 0538  INR 1.16 1.12    Cardiac Enzymes: Recent Labs  Lab 06/13/18 1839  TROPONINI <0.03   BNP: Invalid input(s): POCBNP CBG: Recent Labs  Lab 06/13/18 2310 06/14/18 0302 06/14/18 0735 06/14/18 1141 06/14/18 1529  GLUCAP 128* 105* 99 123* 102*   D-Dimer No results for input(s): DDIMER in the last 72 hours. Hgb A1c No results for input(s): HGBA1C in the last 72 hours. Lipid Profile No results for input(s): CHOL, HDL, LDLCALC, TRIG, CHOLHDL, LDLDIRECT in the last 72 hours. Thyroid function studies No results for input(s): TSH, T4TOTAL, T3FREE, THYROIDAB in the last 72 hours.  Invalid input(s): FREET3 Anemia work up Recent Labs    06/14/18 1820  FERRITIN 1,184*  TIBC 188*  IRON 18*    Urinalysis No results found for: COLORURINE, APPEARANCEUR, [place] , [place], [place], [place], [place], [place], [place], [place], [place], [place]   Microbiology Recent Results (from the past 240 hour(s))  MRSA PCR Screening     Status: None   Collection Time: 06/09/18 10:50 AM  Result Value Ref Range Status   MRSA by PCR NEGATIVE NEGATIVE Final    Comment:        The GeneXpert MRSA Assay (FDA approved for NASAL specimens only), is one component of a comprehensive MRSA colonization surveillance program. It is not intended to diagnose MRSA infection nor to guide or monitor treatment for MRSA infections. Performed at [facility]  Lab, [address], [place], [place] [address]        Inpatient Medications:   Scheduled Meds: . chlorhexidine gluconate (MEDLINE KIT)  15 mL Mouth Rinse BID  . enoxaparin (LOVENOX) injection  40 mg Subcutaneous Q24H  . feeding supplement (ENSURE ENLIVE)  237 mL Oral BID BM  . folic acid  2 mg Oral Daily  . mouth rinse  15 mL Mouth Rinse BID  . sorbitol, milk of mag, mineral oil, glycerin (SMOG) enema  960 mL Rectal Once   Continuous Infusions: . famotidine (PEPCID) IV    . lactated ringers 85 mL/hr at 06/15/18 0956  . methocarbamol (ROBAXIN) IV Stopped (06/14/18 1200)  . ondansetron [facility]) IV       Radiological Exams on Admission: No results found.  Impression/Recommendations Principal Problem:   Lung cancer metastatic to bone [facility]) Active Problems:   Lumbar spine tumor   Bronchitis due to tobacco use   Lung cancer, primary, with metastasis from lung to other site, right (HCC)   Squamous cell lung cancer, right [facility])  Patient with severe ongoing post operative nausea and emesis that is likely in setting of narcotics, relatively recent surgery and suspected gastritis. He is also having constipation which is also likely contributing to his symptoms. Recommend aggressive standing anti-emetics with IV phenergan as  first line to stay ahead of the nausea and using zofran only for breakthrough as well as stool softeners, since zofran is known to exacerbate constipation in some patients. He is also reporting subjective discomfort in his posterior oropharynx, but no object or injury visualized on my exam -- this is likely in setting of ongoing emesis and reflux symptoms. At this time, too early to start considering feeding tube but will need to reconsider over the next 2-3 days if this approach is unsuccessful. If nausea worsening and no BM over the next day, consider functional obstruction.   RECOMMENDATIONS - start IV phenergan standing at 47m q6h then space out as tolerated tomorrow - IV zofran as prn for refractory nausea - continue IV maintenance fluids and liquid diet/advance as tolerated - start protonix 448mdaily (would do PO in the AM) - continue pepcid  - make bowel regimen standing ie dulcolax suppository - if no BM or worsening emesis over the next day, obtain abdom XR and place NG tube for decompression  - note that dilaudid may be worsening nausea but given continued pain, will defer to surgical team. May benefit from adding toradol and other non opioids to regimen   Thank you for this consultation.  [facility] team will follow the patient with you.   Time Spent: > 35 mins  CaColbert Ewing.D. [facility] 06/15/2018, 8:05 PM

## 2018-06-15 NOTE — Progress Notes (Signed)
Physical Therapy Treatment Patient Details Name: Brian [name] MRN: [id number] DOB: [birth year] Today's Date: 06/15/2018    History of Present Illness 72 yo s/p L3-5 PLIF corpectomy metastic with lesion with primary stage III lung CA commanded by acute respiratory failure intubated from 12/20-12/23  PMH: emphysema, pathologic L4 fx, lung CA with mets to the bone, RA, abdominal aortic aneurysm    PT Comments    Pt improved today compared to yesterday. Pt cont to have very weak bilat LEs and increased pain with attempt to standing requiring use of stedy today to achieve full standing and transfer OOB to chair. Pt was able to tolerate sitting EOB without external support. Pt cont to require maxA for all mobility and would greatly benefit from CIR upon d/c for maximal functional recovery.    Follow Up Recommendations  CIR;Supervision/Assistance - 24 hour     Equipment Recommendations  None recommended by PT(TBD)    Recommendations for Other Services Rehab consult     Precautions / Restrictions Precautions Precautions: Back Precaution Booklet Issued: Yes (comment) Precaution Comments: educated pt and spouse, will bring handout tomorrow Required Braces or Orthoses: Spinal Brace Spinal Brace: Thoracolumbosacral orthotic;Applied in sitting position Restrictions Weight Bearing Restrictions: No    Mobility  Bed Mobility Overal bed mobility: Needs Assistance Bed Mobility: Rolling;Sidelying to Sit Rolling: Mod assist   Supine to sit: Max assist Sit to supine: Max assist;+2 for physical assistance Sit to sidelying: +2 for physical assistance General bed mobility comments: increased ability to participate wtih bed mobility  Transfers Overall transfer level: Needs assistance Equipment used: (2 person lift) Transfers: Sit to/from Stand Sit to Stand: Max assist;+2 physical assistance         General transfer comment: pt with increased pain, attemptedx3 unable to achieve full  upright standing due to back pain and bilat LE weakness, unable to achieve bilat knee extension, attempted std pvt however pt unable to advance LEs, attempted side stepping  however unable to step, slid feet   Ambulation/Gait             General Gait Details: unable   Stairs             Wheelchair Mobility    Modified Rankin (Stroke Patients Only)       Balance Overall balance assessment: Needs assistance Sitting-balance support: Feet supported;Bilateral upper extremity supported Sitting balance-Leahy Scale: Fair Sitting balance - Comments: able to sit unsupported   Standing balance support: Bilateral upper extremity supported Standing balance-Leahy Scale: Poor Standing balance comment: dependent on physical assist                            Cognition Arousal/Alertness: Awake/alert Behavior During Therapy: WFL for tasks assessed/performed Overall Cognitive Status: Within Functional Limits for tasks assessed                                        Exercises General Exercises - Lower Extremity Ankle Circles/Pumps: AROM;Both;10 reps;Supine Quad Sets: AROM;Both;10 reps;Supine Gluteal Sets: AROM;Both;10 reps;Supine Long Arc Quad: AROM;Both;10 reps;Seated    General Comments General comments (skin integrity, edema, etc.): dressings intact, SPO2 dec during standing due to holding breath      Pertinent Vitals/Pain Pain Assessment: 0-10 Pain Score: 5  Pain Location: R hip Pain Descriptors / Indicators: Burning Pain Intervention(s): Limited activity within patient's tolerance  Home Living                      Prior Function            PT Goals (current goals can now be found in the care plan section) Acute Rehab PT Goals Patient Stated Goal: stop the pain Progress towards PT goals: Progressing toward goals    Frequency    Min 5X/week      PT Plan Current plan remains appropriate    Co-evaluation PT/OT/SLP  Co-Evaluation/Treatment: Yes Reason for Co-Treatment: Complexity of the patient's impairments (multi-system involvement) PT goals addressed during session: Mobility/safety with mobility OT goals addressed during session: ADL's and self-care      AM-PAC PT "6 Clicks" Mobility   Outcome Measure  Help needed turning from your back to your side while in a flat bed without using bedrails?: A Lot Help needed moving from lying on your back to sitting on the side of a flat bed without using bedrails?: A Lot Help needed moving to and from a bed to a chair (including a wheelchair)?: A Lot Help needed standing up from a chair using your arms (e.g., wheelchair or bedside chair)?: A Lot Help needed to walk in hospital room?: Total Help needed climbing 3-5 steps with a railing? : Total 6 Click Score: 10    End of Session Equipment Utilized During Treatment: Gait belt;Oxygen Activity Tolerance: Patient limited by pain Patient left: in bed;with call bell/phone within reach;with family/visitor present Nurse Communication: Mobility status PT Visit Diagnosis: Unsteadiness on feet (R26.81);Pain;Difficulty in walking, not elsewhere classified (R26.2) Pain - Right/Left: (back, R LE) Pain - part of body: Leg     Time: 0822-0917 PT Time Calculation (min) (ACUTE ONLY): 55 min  Charges:  $Therapeutic Activity: 23-37 mins                     Brian [name], PT, DPT Acute Rehabilitation Services Pager #: [phone] Office #: [phone]    Brian [name] 06/15/2018, 10:29 AM

## 2018-06-15 NOTE — Progress Notes (Signed)
NAME:  Brian [name], MRN:  [id number], DOB:  1945-06-26, LOS: 69 ADMISSION DATE:  06/04/2018, CONSULTATION DATE:  06/11/2018 REFERRING MD:  Dr. Rolena Infante, CHIEF COMPLAINT:   Ventilator management  History of present illness   72 year old man with history of RA, emphysema, tobacco use admitted on 06/04/2018 for lower back pain found to have pathologic L4 fracture and CT chest with right lower lobe mass concerning for bronchogenic carcinoma with metastasis to bone. He underwent L4 Corpectomy, L3-5 anterior reconstruction with ortho on 06/11/2018. During the procedure he received 4 units of pRBC and 2 units of FFP.   Past Medical History  Rheumatoid arthritis, emphysema, tobacco use   Significant Hospital Events   12/13 admission 12/20 L4 Corpectomy, L3-5 anterior reconstruction 06/11/2018 intubated during surgery  Procedures:  12/20 L4 Corpectomy, L3-5 anterior reconstruction 12/20 intubation>> 12/23 06/11/2018 Hemovac drains lumbar area>>  Significant Diagnostic Tests:  CTA abd/pelvis on 12/20 retroperitoneal hematoma seen  CXR on 12/20: Endotracheal tube tip is 8.2 cm above the base the carina. Known right lower lobe mass projects over the right hilum. Chronic interstitial lung disease. Atelectasis or infiltrate at the right base.  CT chest/abd/pelvis w/ contrast on 12/13: Right lower lobe mass lesion. Destructive lesions in the left clavicle, right seventh ribs and L4 vertebral body. Small mediastinal lymph nodes measuring up to 1.5 cm. Two lesions in the liver. Advanced emphysema. Calcific aortic and coronary atherosclerosis. 3.3 cm abdominal aortic aneurysm is identified. Gallstones without evidence of cholecystitis. Cystic lesion in the subcutaneous tissues of the anterior left chest wall. Simple lipoma right gluteal musculature.  MR brain w/ w/o contrast on 12/16 No evidence of metastatic disease. Moderate chronic small-vessel ischemic changes   NM bone scan 12/16 Abnormal  uptake is seen in midportion of right rib, L4 vertebral body and anterior portions of left first rib and left clavicle  MR lumbar spine 12/13 Metastatic lesion of the L4 vertebral body with associated pathologic fracture and posterior bulging of the vertebral body, overlying epidural thickening, severe spinal canal stenosis and severe right neural foraminal stenosis. Moderate right L5-S1 neural foraminal stenosis  EKG 12/16 with sinus bradycardia, no previous for comparison  Micro Data:  MRSA screen neg on 12/18  Antimicrobials:  Vanc x 1 on 12/20 06/11/2018 vancomycin>> 12/23  Interim history/subjective:  C/o back pain.  Breathing okay.  Denies chest congestion.  Objective   Blood pressure 99/80, pulse 88, temperature 98.8 F (37.1 C), temperature source Oral, resp. rate (!) 24, height 5\' 10"  (1.778 m), weight 93.8 kg, SpO2 98 %.        Intake/Output Summary (Last 24 hours) at 06/15/2018 1043 Last data filed at 06/15/2018 0200 Gross per 24 hour  Intake 1591.05 ml  Output 810 ml  Net 781.05 ml   Filed Weights   06/11/18 2000 06/12/18 0500 06/13/18 0500  Weight: 92.1 kg 93 kg 93.8 kg    Examination:  General - alert Eyes - pupils reactive ENT - no sinus tenderness, no stridor Cardiac - regular rate/rhythm, no murmur Chest - decreased BS Abdomen - back brace on Extremities - no cyanosis, clubbing, or edema Skin - no rashes Neuro - normal strength, moves extremities, follows commands Psych - normal mood and behavior      Assessment & Plan:   Acute hypoxic, hypercapnic respiratory failure from medications. - resolved  Stage IV NSCLC. - per oncology  Emphysema. - prn BDs  L4 fracture. - post op care per ortho  Acute blood loss and  iron deficiency anemia. - IV iron per oncology  Hx of rheumatoid arthritis. - recommend consulting hospitalist if assistance needed to manage this while he is in hospital  Best practice:  Diet: Full liquids DVT  prophylaxis: lovenox GI prophylaxis: not indicated Mobility: oob Code Status: full code Family Communication: updated pt's wife at bedside  Labs    CMP Latest Ref Rng & Units 06/15/2018 06/14/2018 06/13/2018  Glucose 70 - 99 mg/dL 97 108(H) 144(H)  BUN 8 - 23 mg/dL 8 7(L) 7(L)  Creatinine 0.61 - 1.24 mg/dL 0.82 0.71 0.62  Sodium 135 - 145 mmol/L 140 138 134(L)  Potassium 3.5 - 5.1 mmol/L 4.6 3.5 3.7  Chloride 98 - 111 mmol/L 107 106 104  CO2 22 - 32 mmol/L 26 21(L) 22  Calcium 8.9 - 10.3 mg/dL 6.9(L) 6.7(L) 6.7(L)  Total Protein 6.5 - 8.1 g/dL - - -  Total Bilirubin 0.3 - 1.2 mg/dL - - -  Alkaline Phos 38 - 126 U/L - - -  AST 15 - 41 U/L - - -  ALT 0 - 44 U/L - - -   CBC Latest Ref Rng & Units 06/15/2018 06/14/2018 06/13/2018  WBC 4.0 - 10.5 K/uL 11.3(H) 15.3(H) -  Hemoglobin 13.0 - 17.0 g/dL 7.8(L) 8.9(L) 7.5(L)  Hematocrit 39.0 - 52.0 % 24.4(L) 26.8(L) 22.0(L)  Platelets 150 - 400 K/uL 224 205 -   PCCM will sign off.  Recommend consulting hospitalist if further medical assistance needed while he is in hospital.  Chesley Mires, MD [place] 06/15/2018, 10:49 AM

## 2018-06-15 NOTE — Progress Notes (Signed)
Occupational Therapy Treatment Patient Details Name: Geo Slone MRN: [id number] DOB: October 14, 1945 Today's Date: 06/15/2018    History of present illness 72 yo s/p L3-5 PLIF corpectomy metastic with lesion with primary stage III lung CA commanded by acute respiratory failure intubated from 12/20-12/23  PMH: emphysema, pathologic L4 fx, lung CA with mets to the bone, RA, abdominal aortic aneurysm   OT comments  Pt able to mobilize OOB to chair during co-treat today with use of Stedy. Able to sit unsupported EOB. Began educating pt on back precautions. Handout reviewed. Feel pt is an excellent candidate for CIR due to deficits below. Will continue to follow acutely.   Follow Up Recommendations  CIR    Equipment Recommendations  3 in 1 bedside commode;Other (comment)    Recommendations for Other Services Rehab consult    Precautions / Restrictions Precautions Precautions: Back Precaution Booklet Issued: Yes (comment) Required Braces or Orthoses: Spinal Brace Spinal Brace: Thoracolumbosacral orthotic;Applied in sitting position       Mobility Bed Mobility Overal bed mobility: Needs Assistance Bed Mobility: Rolling;Sidelying to Sit Rolling: Mod assist   Supine to sit: Max assist     General bed mobility comments: increased ability to participate wtih bed mobility  Transfers Overall transfer level: Needs assistance   Transfers: Sit to/from Stand Sit to Stand: Max assist;+2 physical assistance              Balance     Sitting balance-Leahy Scale: Fair Sitting balance - Comments: able to sit unsupported     Standing balance-Leahy Scale: Poor Standing balance comment: dependent on physical assist                           ADL either performed or assessed with clinical judgement   ADL Overall ADL's : Needs assistance/impaired Eating/Feeding: Set up   Grooming: Set up;Supervision/safety;Wash/dry hands;Brushing hair;Sitting                               Functional mobility during ADLs: Maximal assistance;+2 for physical assistance General ADL Comments: Stedy used for transfer; educated pt on back precautions; handout issued; total A to donn brace; pt's attention limited by pain     Vision       Perception     Praxis      Cognition Arousal/Alertness: Awake/alert Behavior During Therapy: WFL for tasks assessed/performed Overall Cognitive Status: Within Functional Limits for tasks assessed                                          Exercises     Shoulder Instructions       General Comments      Pertinent Vitals/ Pain       Pain Assessment: 0-10 Pain Score: 5  Pain Location: R hip Pain Descriptors / Indicators: Burning Pain Intervention(s): Limited activity within patient's tolerance  Home Living                                          Prior Functioning/Environment              Frequency  Min 3X/week        Progress Toward Goals  OT Goals(current goals can now be found in the care plan section)  Progress towards OT goals: Progressing toward goals  Acute Rehab OT Goals Patient Stated Goal: stop the pain OT Goal Formulation: With patient/family Time For Goal Achievement: 06/28/18 Potential to Achieve Goals: Good ADL Goals Pt Will Perform Grooming: with set-up;sitting Pt Will Perform Upper Body Bathing: with min assist;sitting Pt Will Transfer to Toilet: with max assist;bedside commode;stand pivot transfer Additional ADL Goal #1: pt will verbalize back precautions without cues mod I  Plan Discharge plan remains appropriate    Co-evaluation    PT/OT/SLP Co-Evaluation/Treatment: Yes Reason for Co-Treatment: Complexity of the patient's impairments (multi-system involvement);To address functional/ADL transfers   OT goals addressed during session: ADL's and self-care      AM-PAC OT "6 Clicks" Daily Activity     Outcome Measure   Help from  another person eating meals?: None Help from another person taking care of personal grooming?: A Little Help from another person toileting, which includes using toliet, bedpan, or urinal?: A Lot Help from another person bathing (including washing, rinsing, drying)?: A Lot Help from another person to put on and taking off regular upper body clothing?: A Lot Help from another person to put on and taking off regular lower body clothing?: Total 6 Click Score: 14    End of Session Equipment Utilized During Treatment: Gait belt;Back brace;Oxygen  OT Visit Diagnosis: Unsteadiness on feet (R26.81);Pain;Muscle weakness (generalized) (M62.81) Pain - Right/Left: Right Pain - part of body: Hip   Activity Tolerance Patient tolerated treatment well   Patient Left in chair;with call bell/phone within reach;with chair alarm set   Nurse Communication Mobility status;Need for lift equipment;Precautions        Time: 0045-9977 OT Time Calculation (min): 57 min  Charges: OT General Charges $OT Visit: 1 Visit OT Treatments $Self Care/Home Management : 23-37 mins  Maurie Boettcher, OT/L   Acute OT Clinical Specialist [place] Pager [phone] Office [phone]    [facility] 06/15/2018, 9:26 AM

## 2018-06-15 NOTE — Progress Notes (Signed)
Per Dr. Marin Olp, I faxed paperwork to [employer]. Fax number is [phone]. Received faxed verification.

## 2018-06-15 NOTE — Progress Notes (Signed)
Received patient from 4NICU alert, oriented x4 complaining of hip pain and mucles spasms.  Gave patient (2) Norco and Robaxin crushed in applesauce with lots of encouragement, sitting up right in the bed. Patient insisted on Yanker with suction in case he choked.

## 2018-06-16 LAB — CBC WITH DIFFERENTIAL/PLATELET
Abs Immature Granulocytes: 0.23 10*3/uL — ABNORMAL HIGH (ref 0.00–0.07)
Basophils Absolute: 0.1 10*3/uL (ref 0.0–0.1)
Basophils Relative: 0 %
Eosinophils Absolute: 0.1 10*3/uL (ref 0.0–0.5)
Eosinophils Relative: 1 %
HCT: 24.9 % — ABNORMAL LOW (ref 39.0–52.0)
Hemoglobin: 8.2 g/dL — ABNORMAL LOW (ref 13.0–17.0)
Immature Granulocytes: 2 %
Lymphocytes Relative: 15 %
Lymphs Abs: 1.8 10*3/uL (ref 0.7–4.0)
MCH: 30 pg (ref 26.0–34.0)
MCHC: 32.9 g/dL (ref 30.0–36.0)
MCV: 91.2 fL (ref 80.0–100.0)
Monocytes Absolute: 1 10*3/uL (ref 0.1–1.0)
Monocytes Relative: 8 %
Neutro Abs: 9.1 10*3/uL — ABNORMAL HIGH (ref 1.7–7.7)
Neutrophils Relative %: 74 %
Platelets: 259 10*3/uL (ref 150–400)
RBC: 2.73 MIL/uL — ABNORMAL LOW (ref 4.22–5.81)
RDW: 14.9 % (ref 11.5–15.5)
WBC: 12.4 10*3/uL — ABNORMAL HIGH (ref 4.0–10.5)
nRBC: 0 % (ref 0.0–0.2)

## 2018-06-16 MED ORDER — GUAIFENESIN ER 600 MG PO TB12: 600.0000 mg | ORAL_TABLET | Freq: Two times a day (BID) | ORAL | Status: DC

## 2018-06-16 MED ORDER — PROMETHAZINE HCL 25 MG/ML IJ SOLN
12.5000 mg | Freq: Four times a day (QID) | INTRAMUSCULAR | Status: DC | PRN
  Administered 2018-06-20: 12.5 mg via INTRAVENOUS
  Filled 2018-06-16: qty 1

## 2018-06-16 NOTE — Progress Notes (Signed)
PROGRESS NOTE    Brian [name]  FBP:[id number] DOB: October 03, 1945 DOA: 06/04/2018 PCP: [name], MD   Brief Narrative: Patient is a 72 year old male with past medical history  Rheumatoid arthritis, COPD, tobacco abuse who was admitted on 06/04/2018 for management of low back pain.  He was found to have pathological L4 fracture .CT chest showed right lower lobe mass concerning for bronchogenic carcinoma with metastasis to bone.  He underwent L4 corpectomy, posterior spinal fusion L3-L5  by orthopedic surgery 06/11/2018.  During the procedure he also received 4 units of PRBC and 2 units of FFP.  He was intubated during the surgery and was being managed by PCCM.  Extubated on 12/23. medicine was consulted for the management of nausea and other medical problems.   Assessment & Plan:   Principal Problem:   Lung cancer metastatic to bone [facility]) Active Problems:   Lumbar spine tumor   Bronchitis due to tobacco use   Lung cancer, primary, with metastasis from lung to other site, right (HCC)   Squamous cell lung cancer, right (HCC)  Nausea: Much better today.  Started on Phenergan.  Continue IV Zofran as needed for refractory nausea.  Continue IV fluids.  Diet has been started.  Continue PPI.  He is getting an enema today.Continue bowel regimen.  He has good bowel sounds.  Minimize pain medications.  Encouraged ambulation.  Pathological fracture/back pain: Secondary to metastatic lung cancer.  CT imaging showed right lower lobe mass.  Destructive lesions in the left clavicle, right seventh rib and L4 vertebral body.  Mediastinal lymph nodes measuring up to 1.5 cm.  2 lesions in the liver.  MRI of the brain did not reveal any metastatic disease.  MRI of the lumbar spine confirmed metastatic lesion of the L4 vertebral body with associated pathological fracture.  Underwent L4 corpectomy, posterior spinal fusion L3-L5  by orthopedic surgery 06/11/2018.  Management as per orthopedic space surgery.   Physical therapy consulted.  Recommended CIR.  Acute hypoxic respiratory failure: Had to be intubated during surgery.  Currently respiratory status stable.  Right lower lobe mass/stage IV non-small cell lung cancer:Oncology following.  Found to have squamous cell carcinoma as per pathology report.  Oncology sending molecular profiling for consideration of chemotherapy.  Also planning for radiation therapy.  Anemia: Low iron.  Status post iron infusion.  Cough: Continue mucolytic's.  Continue with incentive spirometry.  Encourage ambulation.  Chest x-ray shows chronic interstitial lung disease, right lower lobe mass and left lower lobe atelectasis.  No pneumonia.  Continue PRN bronchodilators.  Rheumatoid arthritis: Follow-up with his rheumatologist outpatient.    Nutrition Problem: Increased nutrient needs Etiology: cancer and cancer related treatments      DVT prophylaxis:Lovenox Code Status: Full Family Communication: Wife present at the bedside Disposition Plan: Likely CIR as per orthopedics   Procedures: L4 corpectomy, posterior spinal fusion L3-L5  Antimicrobials: None  Subjective: Patient seen and examined the bedside this morning.  Remains hemodynamically stable.  Bothered by congestion and cough.  Nausea is better.  Waiting for bowel movement.  Denies any abdominal pain.  Objective: Vitals:   06/15/18 1553 06/15/18 2308 06/16/18 0413 06/16/18 0700  BP: (!) 115/57 124/64 120/79 126/68  Pulse: 81 87 86 94  Resp: _0 Temp: 97.7 F (36.5 C) 98.6 F (37 C) 99 F (37.2 C) 98 F (36.7 C)  TempSrc: Oral Oral Oral Oral  SpO2: 99% 92% 99% 96%  Weight:      Height:  Intake/Output Summary (Last 24 hours) at 06/16/2018 1241 Last data filed at 06/16/2018 0600 Gross per 24 hour  Intake 2312.39 ml  Output 810 ml  Net 1502.39 ml   Filed Weights   06/11/18 2000 06/12/18 0500 06/13/18 0500  Weight: 92.1 kg 93 kg 93.8 kg    Examination:  General  exam: Appears calm and comfortable ,Not in distress,average built HEENT:PERRL,Oral mucosa moist, Ear/Nose normal on gross exam Respiratory system: Bilateral decreased air entry in the bases Cardiovascular system: S1 & S2 heard, RRR. No JVD, murmurs, rubs, gallops or clicks. No pedal edema. Gastrointestinal system: Abdomen is nondistended, soft and nontender. No organomegaly or masses felt.  Active bowel sounds heard. Central nervous system: Alert and oriented. No focal neurological deficits. Extremities: No edema, no clubbing ,no cyanosis, distal peripheral pulses palpable. Skin: No rashes, lesions or ulcers,no icterus ,no pallor MSK: Normal muscle bulk,tone ,power, clean surgical scars on the back Psychiatry: Judgement and insight appear normal. Mood & affect appropriate.     Data Reviewed: I have personally reviewed following labs and imaging studies  CBC: Recent Labs  Lab 06/12/18 0456 06/13/18 0544 06/13/18 1215 06/14/18 0538 06/15/18 0214 06/16/18 0940  WBC 11.7* 14.8*  --  15.3* 11.3* 12.4*  NEUTROABS  --  12.1*  --   --   --  9.1*  HGB 10.8* 9.7* 7.5* 8.9* 7.8* 8.2*  HCT 32.3* 28.8* 22.0* 26.8* 24.4* 24.9*  MCV 88.7 92.6  --  94.0 96.1 91.2  PLT 185 181  --  205 224 341   Basic Metabolic Panel: Recent Labs  Lab 06/12/18 0456 06/13/18 0544 06/13/18 1215 06/13/18 1839 06/14/18 0538 06/15/18 0214  NA 137 138 139 134* 138 140  K 3.5 3.2* 3.2* 3.7 3.5 4.6  CL 107 108  --  104 106 107  CO2 20* 21*  --  22 21* 26  GLUCOSE 107* 113*  --  144* 108* 97  BUN 12 8  --  7* 7* 8  CREATININE 0.82 0.77  --  0.62 0.71 0.82  CALCIUM 7.3* 7.0*  --  6.7* 6.7* 6.9*  MG  --  2.1  --  2.2  --   --   PHOS  --  2.3*  --  3.0  --   --    GFR: Estimated Creatinine Clearance: 92.3 mL/min (by C-G formula based on SCr of 0.82 mg/dL). Liver Function Tests: No results for input(s): AST, ALT, ALKPHOS, BILITOT, PROT, ALBUMIN in the last 168 hours. No results for input(s): LIPASE, AMYLASE  in the last 168 hours. No results for input(s): AMMONIA in the last 168 hours. Coagulation Profile: Recent Labs  Lab 06/12/18 0456 06/14/18 0538  INR 1.16 1.12   Cardiac Enzymes: Recent Labs  Lab 06/13/18 1839  TROPONINI <0.03   BNP (last 3 results) No results for input(s): PROBNP in the last 8760 hours. HbA1C: No results for input(s): HGBA1C in the last 72 hours. CBG: Recent Labs  Lab 06/13/18 2310 06/14/18 0302 06/14/18 0735 06/14/18 1141 06/14/18 1529  GLUCAP 128* 105* 99 123* 102*   Lipid Profile: No results for input(s): CHOL, HDL, LDLCALC, TRIG, CHOLHDL, LDLDIRECT in the last 72 hours. Thyroid Function Tests: No results for input(s): TSH, T4TOTAL, FREET4, T3FREE, THYROIDAB in the last 72 hours. Anemia Panel: Recent Labs    06/14/18 1820  FERRITIN 1,184*  TIBC 188*  IRON 18*   Sepsis Labs: No results for input(s): PROCALCITON, LATICACIDVEN in the last 168 hours.  Recent Results (from the  past 240 hour(s))  MRSA PCR Screening     Status: None   Collection Time: 06/09/18 10:50 AM  Result Value Ref Range Status   MRSA by PCR NEGATIVE NEGATIVE Final    Comment:        The GeneXpert MRSA Assay (FDA approved for NASAL specimens only), is one component of a comprehensive MRSA colonization surveillance program. It is not intended to diagnose MRSA infection nor to guide or monitor treatment for MRSA infections. Performed at [facility], [place] [address], [place], [place] [address]          Radiology Studies: No results found.      Scheduled Meds: . chlorhexidine gluconate (MEDLINE KIT)  15 mL Mouth Rinse BID  . enoxaparin (LOVENOX) injection  40 mg Subcutaneous Q24H  . feeding supplement (ENSURE ENLIVE)  237 mL Oral BID BM  . folic acid  2 mg Oral Daily  . mouth rinse  15 mL Mouth Rinse BID   Continuous Infusions: . famotidine (PEPCID) IV 20 mg (06/16/18 1046)  . lactated ringers 85 mL/hr at 06/16/18 0823  . methocarbamol (ROBAXIN)  IV 500 mg (06/16/18 0941)  . ondansetron (ZOFRAN) IV       LOS: 12 days    Time spent: More than 50% of that time was spent in counseling and/or coordination of care.      Shelly Coss, MD [facility] Pager [phone]  If 7PM-7AM, please contact night-coverage [email or web address] Password [facility] 06/16/2018, 12:41 PM

## 2018-06-16 NOTE — Progress Notes (Signed)
   Subjective: 3 Days Post-Op Procedure(s) (LRB): POSTERIOR SPINAL FUSION L3-L5 (N/A) Patient reports pain as moderate.   Patient seen in rounds for Dr. Rolena Infante. Patient is well, but has had some minor complaints of constipation, pain in the low back, requiring pain medications and chest congestion. Foley to straight drain. Positive flatus but no BM. Patient reports some frustration with therapy and the amount of pain it causes.    Objective: Vital signs in last 24 hours: Temp:  [97.7 F (36.5 C)-99 F (37.2 C)] 98 F (36.7 C) (12/25 0700) Pulse Rate:  [81-94] 94 (12/25 0700) Resp:  [16-18] 18 (12/25 0700) BP: (107-126)/(57-79) 126/68 (12/25 0700) SpO2:  [92 %-99 %] 96 % (12/25 0700)  Intake/Output from previous day:  Intake/Output Summary (Last 24 hours) at 06/16/2018 0916 Last data filed at 06/16/2018 0600 Gross per 24 hour  Intake 2312.39 ml  Output 810 ml  Net 1502.39 ml     Labs: Recent Labs    06/13/18 1215 06/14/18 0538 06/15/18 0214  HGB 7.5* 8.9* 7.8*   Recent Labs    06/14/18 0538 06/15/18 0214  WBC 15.3* 11.3*  RBC 2.85* 2.54*  HCT 26.8* 24.4*  PLT 205 224   Recent Labs    06/14/18 0538 06/15/18 0214  NA 138 140  K 3.5 4.6  CL 106 107  CO2 21* 26  BUN 7* 8  CREATININE 0.71 0.82  GLUCOSE 108* 97  CALCIUM 6.7* 6.9*   Recent Labs    06/14/18 0538  INR 1.12    EXAM General - Patient is Alert and Oriented Extremity - Neurologically intact Intact pulses distally No cellulitis present Compartment soft Dressing/Incision - clean, dry Motor Function - intact, moving foot and toes well on exam.  Neuro: generalized LE wekaness is noted.  No focal motor deficits noted.  Diffuse numbness in LE to light touch.  Past Medical History:  Diagnosis Date  . Arthritis     Assessment/Plan: 3 Days Post-Op Procedure(s) (LRB): POSTERIOR SPINAL FUSION L3-L5 (N/A) Principal Problem:   Lung cancer metastatic to bone [facility]) Active Problems:   Lumbar  spine tumor   Bronchitis due to tobacco use   Lung cancer, primary, with metastasis from lung to other site, right (HCC)   Squamous cell lung cancer, right (HCC)  Estimated body mass index is 29.67 kg/m as calculated from the following:   Height as of this encounter: 5\' 10"  (1.778 m).   Weight as of this encounter: 93.8 kg. Advance diet Up with therapy  DVT Prophylaxis - Lovenox Weight-Bearing as tolerated   Patient to receive enema this morning. Continue O2 and respiratory therapy. Incentive spirometer encouraged. Discussed with the patient the importance of walking with therapy. CBC with diff ordered this morning. Will decide on transfusion based on results and clinical need.  Ardeen Jourdain, PA-C Orthopaedic Surgery 06/16/2018, 9:16 AM

## 2018-06-17 ENCOUNTER — Encounter (HOSPITAL_COMMUNITY): Payer: Self-pay | Admitting: Physical Medicine and Rehabilitation

## 2018-06-17 LAB — CBC WITH DIFFERENTIAL/PLATELET
Abs Immature Granulocytes: 0.23 10*3/uL — ABNORMAL HIGH (ref 0.00–0.07)
Basophils Absolute: 0 10*3/uL (ref 0.0–0.1)
Basophils Relative: 0 %
Eosinophils Absolute: 0.1 10*3/uL (ref 0.0–0.5)
Eosinophils Relative: 1 %
HCT: 24.6 % — ABNORMAL LOW (ref 39.0–52.0)
Hemoglobin: 7.7 g/dL — ABNORMAL LOW (ref 13.0–17.0)
Immature Granulocytes: 2 %
Lymphocytes Relative: 12 %
Lymphs Abs: 1.3 10*3/uL (ref 0.7–4.0)
MCH: 29.3 pg (ref 26.0–34.0)
MCHC: 31.3 g/dL (ref 30.0–36.0)
MCV: 93.5 fL (ref 80.0–100.0)
Monocytes Absolute: 0.8 10*3/uL (ref 0.1–1.0)
Monocytes Relative: 7 %
NRBC: 0 % (ref 0.0–0.2)
Neutro Abs: 8.9 10*3/uL — ABNORMAL HIGH (ref 1.7–7.7)
Neutrophils Relative %: 78 %
Platelets: 284 10*3/uL (ref 150–400)
RBC: 2.63 MIL/uL — ABNORMAL LOW (ref 4.22–5.81)
RDW: 14.9 % (ref 11.5–15.5)
WBC: 11.3 10*3/uL — ABNORMAL HIGH (ref 4.0–10.5)

## 2018-06-17 MED ORDER — GUAIFENESIN 100 MG/5ML PO SOLN
10.0000 mL | Freq: Four times a day (QID) | ORAL | Status: DC
  Administered 2018-06-17 – 2018-06-29 (×40): 200 mg via ORAL
  Filled 2018-06-17 (×17): qty 10
  Filled 2018-06-17: qty 100
  Filled 2018-06-17 (×25): qty 10

## 2018-06-17 MED ORDER — METHOCARBAMOL 500 MG PO TABS
500.0000 mg | ORAL_TABLET | Freq: Four times a day (QID) | ORAL | Status: DC | PRN
  Administered 2018-06-17 – 2018-06-24 (×8): 500 mg via ORAL
  Filled 2018-06-17 (×8): qty 1

## 2018-06-17 MED ORDER — FAMOTIDINE 20 MG PO TABS
20.0000 mg | ORAL_TABLET | Freq: Two times a day (BID) | ORAL | Status: DC
  Administered 2018-06-17 – 2018-06-23 (×13): 20 mg via ORAL
  Filled 2018-06-17 (×13): qty 1

## 2018-06-17 MED ORDER — SODIUM CHLORIDE 0.9 % IV BOLUS: 500.0000 mL | Freq: Once | INTRAVENOUS | Status: DC

## 2018-06-17 NOTE — Progress Notes (Signed)
PROGRESS NOTE    Brian [name]  YDX:[id number] DOB: October 01, 1945 DOA: 06/04/2018 PCP: [name], MD   Brief Narrative: Patient is a 72 year old male with past medical history  Rheumatoid arthritis, COPD, tobacco abuse who was admitted on 06/04/2018 for management of low back pain.  He was found to have pathological L4 fracture .CT chest showed right lower lobe mass concerning for bronchogenic carcinoma with metastasis to bone.  He underwent L4 corpectomy, posterior spinal fusion L3-L5  by orthopedic surgery 06/11/2018.  During the procedure he also received 4 units of PRBC and 2 units of FFP.  He was intubated during the surgery and was being managed by PCCM.  Extubated on 12/23. Medicine was consulted for the management of nausea and other medical problems.   Assessment & Plan:   Principal Problem:   Lung cancer metastatic to bone [facility]) Active Problems:   Lumbar spine tumor   Bronchitis due to tobacco use   Lung cancer, primary, with metastasis from lung to other site, right (HCC)   Squamous cell lung cancer, right (HCC)  Nausea: Patient looks comfortable.Denied vomiting. Says he still has some nausea.  Started on Phenergan.  Continue IV Zofran as needed for refractory nausea.  Continue IV fluids.  Diet has been started.  Continue PPI.  He had a bowel movement yesterday .Continue bowel regimen.  He has good bowel sounds.  Minimize pain medications.  Encouraged ambulation.  Pathological fracture/back pain: Secondary to metastatic lung cancer.  CT imaging showed right lower lobe mass.  Destructive lesions in the left clavicle, right seventh rib and L4 vertebral body.  Mediastinal lymph nodes measuring up to 1.5 cm.  2 lesions in the liver.  MRI of the brain did not reveal any metastatic disease.  MRI of the lumbar spine confirmed metastatic lesion of the L4 vertebral body with associated pathological fracture.  Underwent L4 corpectomy, posterior spinal fusion L3-L5  by orthopedic  surgery 06/11/2018.  Management as per orthopedic space surgery.  Physical therapy consulted.  Recommended CIR.  Acute hypoxic respiratory failure: Had to be intubated during surgery.  Currently respiratory status stable.Continue weaning oxygen to off.  Right lower lobe mass/stage IV non-small cell lung cancer:Oncology following.  Found to have squamous cell carcinoma as per pathology report.  Oncology sending molecular profiling for consideration of chemotherapy.  Also planning for radiation therapy.  Anemia: Low iron.  Status post iron infusion.Hb is 7.7  today.  Cough: Continue mucolytic's.  Continue with incentive spirometry.  Encourage ambulation.  Chest x-ray shows chronic interstitial lung disease, right lower lobe mass and left lower lobe atelectasis.  No pneumonia.  Continue PRN bronchodilators.  Rheumatoid arthritis: Follow-up with his rheumatologist outpatient.    Nutrition Problem: Increased nutrient needs Etiology: cancer and cancer related treatments      DVT prophylaxis:Lovenox Code Status: Full Family Communication: Wife present at the bedside Disposition Plan: Likely CIR .  Discharge planning as per orthopedics.  He is medically cleared for discharge to CIR whenever possible   Procedures: L4 corpectomy, posterior spinal fusion L3-L5  Antimicrobials: None  Subjective: Patient seen and examined the bedside this morning.  Remains hemodynamically stable.  Says he is still bothered by congestion.  Nausea is better but has not competely gone.  Had a bowel movement yesterday.  Denies any abdominal pain.  Complains of severe pain on the back on ambulation.  Objective: Vitals:   06/17/18 0300 06/17/18 0500 06/17/18 0739 06/17/18 0800  BP: 124/88 124/88 130/80   Pulse:  97 89 87 (!) 133  Resp: _0 Temp: 98.6 F (37 C)  97.8 F (36.6 C)   TempSrc: Oral  Oral   SpO2: 94% 95% 95% (!) 89%  Weight:      Height:        Intake/Output Summary (Last 24 hours) at  06/17/2018 1209 Last data filed at 06/17/2018 0800 Gross per 24 hour  Intake 2562.93 ml  Output 525 ml  Net 2037.93 ml   Filed Weights   06/11/18 2000 06/12/18 0500 06/13/18 0500  Weight: 92.1 kg 93 kg 93.8 kg    Examination:  General exam: Appears calm and comfortable ,Not in distress,average built HEENT:PERRL,Oral mucosa moist, Ear/Nose normal on gross exam Respiratory system: Bilateral decreased air entry in the bases Cardiovascular system: S1 & S2 heard, RRR. No JVD, murmurs, rubs, gallops or clicks. No pedal edema. Gastrointestinal system: Abdomen is nondistended, soft and nontender. No organomegaly or masses felt.  Active bowel sounds heard.Brace  Central nervous system: Alert and oriented. No focal neurological deficits. Extremities: No edema, no clubbing ,no cyanosis, distal peripheral pulses palpable. Skin: No rashes, lesions or ulcers,no icterus ,no pallor MSK: Normal muscle bulk,tone ,power, clean surgical scars on the back Psychiatry: Judgement and insight appear normal. Mood & affect appropriate.     Data Reviewed: I have personally reviewed following labs and imaging studies  CBC: Recent Labs  Lab 06/13/18 0544 06/13/18 1215 06/14/18 0538 06/15/18 0214 06/16/18 0940 06/17/18 0345  WBC 14.8*  --  15.3* 11.3* 12.4* 11.3*  NEUTROABS 12.1*  --   --   --  9.1* 8.9*  HGB 9.7* 7.5* 8.9* 7.8* 8.2* 7.7*  HCT 28.8* 22.0* 26.8* 24.4* 24.9* 24.6*  MCV 92.6  --  94.0 96.1 91.2 93.5  PLT 181  --  205 224 259 130   Basic Metabolic Panel: Recent Labs  Lab 06/12/18 0456 06/13/18 0544 06/13/18 1215 06/13/18 1839 06/14/18 0538 06/15/18 0214  NA 137 138 139 134* 138 140  K 3.5 3.2* 3.2* 3.7 3.5 4.6  CL 107 108  --  104 106 107  CO2 20* 21*  --  22 21* 26  GLUCOSE 107* 113*  --  144* 108* 97  BUN 12 8  --  7* 7* 8  CREATININE 0.82 0.77  --  0.62 0.71 0.82  CALCIUM 7.3* 7.0*  --  6.7* 6.7* 6.9*  MG  --  2.1  --  2.2  --   --   PHOS  --  2.3*  --  3.0  --   --      GFR: Estimated Creatinine Clearance: 92.3 mL/min (by C-G formula based on SCr of 0.82 mg/dL). Liver Function Tests: No results for input(s): AST, ALT, ALKPHOS, BILITOT, PROT, ALBUMIN in the last 168 hours. No results for input(s): LIPASE, AMYLASE in the last 168 hours. No results for input(s): AMMONIA in the last 168 hours. Coagulation Profile: Recent Labs  Lab 06/12/18 0456 06/14/18 0538  INR 1.16 1.12   Cardiac Enzymes: Recent Labs  Lab 06/13/18 1839  TROPONINI <0.03   BNP (last 3 results) No results for input(s): PROBNP in the last 8760 hours. HbA1C: No results for input(s): HGBA1C in the last 72 hours. CBG: Recent Labs  Lab 06/13/18 2310 06/14/18 0302 06/14/18 0735 06/14/18 1141 06/14/18 1529  GLUCAP 128* 105* 99 123* 102*   Lipid Profile: No results for input(s): CHOL, HDL, LDLCALC, TRIG, CHOLHDL, LDLDIRECT in the last 72 hours. Thyroid Function Tests: No  results for input(s): TSH, T4TOTAL, FREET4, T3FREE, THYROIDAB in the last 72 hours. Anemia Panel: Recent Labs    06/14/18 1820  FERRITIN 1,184*  TIBC 188*  IRON 18*   Sepsis Labs: No results for input(s): PROCALCITON, LATICACIDVEN in the last 168 hours.  Recent Results (from the past 240 hour(s))  MRSA PCR Screening     Status: None   Collection Time: 06/09/18 10:50 AM  Result Value Ref Range Status   MRSA by PCR NEGATIVE NEGATIVE Final    Comment:        The GeneXpert MRSA Assay (FDA approved for NASAL specimens only), is one component of a comprehensive MRSA colonization surveillance program. It is not intended to diagnose MRSA infection nor to guide or monitor treatment for MRSA infections. Performed at [facility], [place] [address] [place], [place] [address]          Radiology Studies: No results found.      Scheduled Meds: . chlorhexidine gluconate (MEDLINE KIT)  15 mL Mouth Rinse BID  . enoxaparin (LOVENOX) injection  40 mg Subcutaneous Q24H  . feeding supplement  (ENSURE ENLIVE)  237 mL Oral BID BM  . folic acid  2 mg Oral Daily  . guaiFENesin  10 mL Oral Q6H  . mouth rinse  15 mL Mouth Rinse BID   Continuous Infusions: . famotidine (PEPCID) IV 20 mg (06/17/18 1101)  . lactated ringers 85 mL/hr at 06/17/18 0800  . methocarbamol (ROBAXIN) IV Stopped (06/16/18 2056)  . ondansetron (ZOFRAN) IV    . sodium chloride       LOS: 13 days    Time spent:35 mins. More than 50% of that time was spent in counseling and/or coordination of care.      Shelly Coss, MD [facility] Pager [phone]  If 7PM-7AM, please contact night-coverage [email or web address] Password [facility] 06/17/2018, 12:09 PM

## 2018-06-17 NOTE — Progress Notes (Signed)
Rehab Admissions Coordinator Note:  Patient was screened by Jhonnie Garner for appropriateness for an Inpatient Acute Rehab Consult.  At this time, we are recommending Inpatient Rehab consult. AC will contact MD regarding request for IP Rehab Consult Order.  Jhonnie Garner 06/17/2018, 11:54 AM  I can be reached at [phone].

## 2018-06-17 NOTE — Progress Notes (Signed)
    Subjective: Procedure(s) (LRB): POSTERIOR SPINAL FUSION L3-L5 (N/A) 4 Days Post-Op  Patient reports pain as 4 on 0-10 scale.  Reports unchanged leg pain reports incisional back pain   Positive void Positive bowel movement Positive flatus Negative chest pain or shortness of breath  Objective: Vital signs in last 24 hours: Temp:  [97.8 F (36.6 C)-98.6 F (37 C)] 97.8 F (36.6 C) (12/26 0739) Pulse Rate:  [87-133] 94 (12/26 1440) Resp:  [15-19] 18 (12/26 1440) BP: (117-138)/(72-88) 138/83 (12/26 1440) SpO2:  [89 %-95 %] 95 % (12/26 1440)  Intake/Output from previous day: 12/25 0701 - 12/26 0700 In: 1321.2 [P.O.:480; I.V.:741.2; IV Piggyback:100] Out: 525 [Urine:525]  Labs: Recent Labs    06/16/18 0940 06/17/18 0345  WBC 12.4* 11.3*  RBC 2.73* 2.63*  HCT 24.9* 24.6*  PLT 259 284   Recent Labs    06/15/18 0214  NA 140  K 4.6  CL 107  CO2 26  BUN 8  CREATININE 0.82  GLUCOSE 97  CALCIUM 6.9*   No results for input(s): LABPT, INR in the last 72 hours.  Physical Exam: Neurologically intact Intact pulses distally Incision: dressing C/D/I Compartment soft Body mass index is 29.67 kg/m.   Assessment/Plan: Patient stable  xrays n/a Continue mobilization with physical therapy Continue care  Advance diet Up with therapy  1. Nausea/emesis improving.  Continue to monitor  2. D/c telemetry as patient has been stable 3. HST stable - will monitor exam and hold on transfusion  4.agree with CIR when bed is ready   Melina Schools, MD [facility] [phone]

## 2018-06-17 NOTE — Care Management Important Message (Signed)
Important Message  Patient Details  Name: Brian [name] MRN: [id number] Date of Birth: 1946-04-17   Medicare Important Message Given:  Yes    [name] Enrika Aguado 06/17/2018, 4:58 PM

## 2018-06-17 NOTE — Social Work (Signed)
CSW acknowledging consult for SNF placement. Will follow for therapy recommendations, at current time pt is recommended for CIR.    Westley Hummer, MSW, [place] Work [phone]

## 2018-06-17 NOTE — Progress Notes (Signed)
Physical Therapy Treatment Patient Details Name: Brian [name] MRN: [id number] DOB: [birth year] Today's Date: 06/17/2018    History of Present Illness 72 yo s/p L3-5 PLIF corpectomy metastic with lesion with primary stage III lung CA commanded by acute respiratory failure intubated from 12/20-12/23  PMH: emphysema, pathologic L4 fx, lung CA with mets to the bone, RA, abdominal aortic aneurysm    PT Comments    Continuing work on functional mobility and activity tolerance;  Reinforced back precautions as well; Noting improvements in transfers, especially initial sit to stand; Still with significant bilateral knees buckling with transfers and weight shifts; fatigues quickly in stance; Continue to recommend comprehensive inpatient rehab (CIR) for post-acute therapy needs.   Follow Up Recommendations  CIR;Supervision/Assistance - 24 hour     Equipment Recommendations  None recommended by PT(TBD)    Recommendations for Other Services Rehab consult     Precautions / Restrictions Precautions Precautions: Back Precaution Booklet Issued: Yes (comment) Precaution Comments: educated pt and spouse Required Braces or Orthoses: Spinal Brace Spinal Brace: Thoracolumbosacral orthotic;Applied in sitting position Restrictions Weight Bearing Restrictions: No    Mobility  Bed Mobility Overal bed mobility: Needs Assistance Bed Mobility: Rolling;Sidelying to Sit Rolling: Mod assist Sidelying to sit: Mod assist;+2 for physical assistance       General bed mobility comments: Cues for log roll technique; Mod assist to elevate trunk sidelying to sit  Transfers Overall transfer level: Needs assistance Equipment used: 2 person hand held assist(and bed pad cradling hips initially) Transfers: Sit to/from [facility] Sit to Stand: Mod assist;+2 physical assistance;+2 safety/equipment Stand pivot transfers: Max assist;+2 physical assistance;+2 safety/equipment       General  transfer comment: Bed elevated; Initial sit to stand went quite well, with bilateral mod assist to rise; More difficulty with dynamic motion, weight shift, and stance stabiltiy to take small steps to chair; significant knee buckling, requiring knees to be blocked to stability during transfer  Ambulation/Gait             General Gait Details: unable   Stairs             Wheelchair Mobility    Modified Rankin (Stroke Patients Only)       Balance     Sitting balance-Leahy Scale: Fair       Standing balance-Leahy Scale: Poor                              Cognition Arousal/Alertness: Awake/alert Behavior During Therapy: WFL for tasks assessed/performed Overall Cognitive Status: Within Functional Limits for tasks assessed                                        Exercises      General Comments        Pertinent Vitals/Pain Pain Assessment: 0-10 Pain Score: 6  Pain Location: low back and hips Pain Descriptors / Indicators: Aching;Burning Pain Intervention(s): Monitored during session    Home Living                      Prior Function            PT Goals (current goals can now be found in the care plan section) Acute Rehab PT Goals Patient Stated Goal: stop the pain PT Goal Formulation: With patient Time For Goal  Achievement: 06/28/18 Potential to Achieve Goals: Good Progress towards PT goals: Progressing toward goals    Frequency    Min 5X/week      PT Plan Current plan remains appropriate    Co-evaluation              AM-PAC PT "6 Clicks" Mobility   Outcome Measure  Help needed turning from your back to your side while in a flat bed without using bedrails?: A Lot Help needed moving from lying on your back to sitting on the side of a flat bed without using bedrails?: A Lot Help needed moving to and from a bed to a chair (including a wheelchair)?: A Lot Help needed standing up from a chair using  your arms (e.g., wheelchair or bedside chair)?: A Lot Help needed to walk in hospital room?: Total Help needed climbing 3-5 steps with a railing? : Total 6 Click Score: 10    End of Session Equipment Utilized During Treatment: Gait belt;Oxygen Activity Tolerance: Patient tolerated treatment well Patient left: in chair;with call bell/phone within reach;with chair alarm set Nurse Communication: Mobility status PT Visit Diagnosis: Unsteadiness on feet (R26.81);Pain;Difficulty in walking, not elsewhere classified (R26.2) Pain - part of body: Leg     Time: 9753-0051 PT Time Calculation (min) (ACUTE ONLY): 32 min  Charges:  $Therapeutic Activity: 23-37 mins                     Roney Marion, PT  Acute Rehabilitation Services Pager [phone] Office [place] 06/17/2018, 11:42 AM

## 2018-06-17 NOTE — Consult Note (Signed)
Physical Medicine and Rehabilitation Consult   Reason for Consult: Functional deficits due to L4 pathalogic fracture due to metastic lung cancer.  Referring Physician: Dr. Prudence Davidson.    HPI: Brian [name] is a 72 y.o. male with history of RA, emphysema, tobacco abuse with low back and hip pain. Work up done revealing of bronchogenic RLL lung cancer with bony mets to left clavicle, right 8th rib, L4 vertebral body with fracture and 2.5 cm liver lesion. He was admitted form office for work up on 06/06/18 and surgical intervention of pathologic fracture recommended by Dr. Marin Olp. He underwent L4 corpectomy with L3-L5 anterior fusion by Dr.Brooks on 12/20. Perioperatively with blood loss with hematoma at corpectomy site requiring 4 units  PRBC and 2 units FFP. Surgery stopped due to bleeding and CTA abdomen/pelvis revealed small retroperitoneal hematoma win right lateral spine extending to psoas muscle. Once stabilized, he was taken back to OR on 12/22 for screw fixation L3 - L5.   Pathology reveled metastatic squamous cell lung cancer and plans for XRT in the future.  H/H being monitored and he continues on full liquid diet due to constipation. Post op with BLE weakness as well as pain affecting mobility and self care tasks. He was able to sit up in a chair for an hour today. CIR recommended due to functional deficits.    Review of Systems  Constitutional: Negative for chills and fever.  HENT: Negative for hearing loss and tinnitus.   Eyes: Negative for blurred vision and double vision.  Respiratory: Positive for cough and shortness of breath.   Cardiovascular: Positive for leg swelling. Negative for chest pain and palpitations.  Gastrointestinal: Positive for constipation and nausea.  Musculoskeletal: Positive for joint pain (back radiating to RLE and left shoulder).  Skin: Negative for rash.  Neurological: Positive for sensory change (RLE for 6 weeks PTA) and focal weakness.  Negative for dizziness.  Psychiatric/Behavioral: The patient is nervous/anxious.       Past Medical History:  Diagnosis Date  . Arthritis     Past Surgical History:  Procedure Laterality Date  . ANTERIOR CERVICAL CORPECTOMY Left 06/11/2018   Procedure: L4 Corpectomy, L3-5 anterior reconstruction,;  Surgeon: Melina Schools, MD;  Location: [place];  Service: Orthopedics;  Laterality: Left;  6.5 hrs- Ok for this day/time per April  . CATARACT EXTRACTION, BILATERAL      Family History  Problem Relation Age of Onset  . Diabetes Mother   . Heart attack Father   . Obesity Father   . Diabetes Sister      Social History:  Married. Retired Engineer, maintenance (IT). Independent with use of walker for a couple of months. Wife supportive.  He reports that he has been smoking cigarettes. He has been smoking about 0.25 packs per day. He has never used smokeless tobacco. He reports that he does not use drugs. No history on file for alcohol.    Allergies  Allergen Reactions  . Minoxidil Palpitations  . Nicotine Palpitations  . Sulfamethoxazole Other (See Comments)    Both parents allergic, avoids med    Medications Prior to Admission  Medication Sig Dispense Refill  . aspirin 81 MG chewable tablet Chew 81 mg by mouth daily.    . Calcium Carb-Cholecalciferol (CALCIUM 600/VITAMIN D3) 600-800 MG-UNIT TABS Take 1 tablet by mouth daily.    Marland Kitchen docusate sodium (COLACE) 100 MG capsule Take 100 mg by mouth 2 (two) times daily as needed for mild constipation.    Marland Kitchen  folic acid (FOLVITE) 1 MG tablet Take 1 mg by mouth daily.    . Homeopathic Products (T-RELIEF PAIN RELIEF) CREA Apply 1 application topically 2 (two) times daily. Hip/Back pain    . HYDROmorphone (DILAUDID) 4 MG tablet Take 4 mg by mouth every 6 (six) hours as needed for severe pain.    . hydroxypropyl methylcellulose / hypromellose (ISOPTO TEARS / GONIOVISC) 2.5 % ophthalmic solution Place 1-2 drops into both eyes as needed for dry eyes.    Marland Kitchen ibuprofen  (ADVIL,MOTRIN) 200 MG tablet Take 400 mg by mouth every 6 (six) hours as needed for headache or mild pain.    Marland Kitchen Lysine 500 MG CAPS Take 500 mg by mouth daily as needed (chapped lips).    . Omega-3 350 MG CPDR Take 350 mg by mouth daily.    . Potassium 99 MG TABS Take 99 mg by mouth daily as needed (leg cramps).    . pseudoephedrine (SUDAFED) 120 MG 12 hr tablet Take 120 mg by mouth every 12 (twelve) hours as needed for congestion.      Home: Home Living Family/patient expects to be discharged to:: Private residence Living Arrangements: Spouse/significant other Available Help at Discharge: Family, Available 24 hours/day Type of Home: House Home Access: Stairs to enter [employer] of Steps: 6 Entrance Stairs-Rails: Right Home Layout: One level Bathroom Shower/Tub: Multimedia programmer: [place]: Environmental consultant - 2 wheels, Grab bars - tub/shower, Shower seat, Hand held shower head, Adaptive equipment(frame for toilet that are just "handles" not a BSC) Adaptive Equipment: Reacher Additional Comments: two cats- within the home but 10 total in the house but are in a special room in the house  Functional History: Prior Function Level of Independence: Needs assistance Gait / Transfers Assistance Needed: walker at baseline ADL's / Homemaking Assistance Needed: sitting with wife helping with back and buttock Functional Status:  Mobility: Bed Mobility Overal bed mobility: Needs Assistance Bed Mobility: Rolling, Sidelying to Sit Rolling: Mod assist Sidelying to sit: Mod assist, +2 for physical assistance Supine to sit: Max assist Sit to supine: Max assist, +2 for physical assistance Sit to sidelying: +2 for physical assistance General bed mobility comments: Cues for log roll technique; Mod assist to elevate trunk sidelying to sit Transfers Overall transfer level: Needs assistance Equipment used: 2 person hand held assist(and bed pad cradling hips  initially) Transfer via Lift Equipment: Stedy Transfers: Sit to/from Stand, [employer] Transfers Sit to Stand: Mod assist, +2 physical assistance, +2 safety/equipment Stand pivot transfers: Max assist, +2 physical assistance, +2 safety/equipment General transfer comment: Bed elevated; Initial sit to stand went quite well, with bilateral mod assist to rise; More difficulty with dynamic motion, weight shift, and stance stabiltiy to take small steps to chair; significant knee buckling, requiring knees to be blocked to stability during transfer Ambulation/Gait General Gait Details: unable    ADL: ADL Overall ADL's : Needs assistance/impaired Eating/Feeding: Set up Grooming: Set up, Supervision/safety, Wash/dry hands, Brushing hair, Sitting Upper Body Bathing: Moderate assistance Lower Body Bathing: Total assistance Upper Body Dressing : Maximal assistance Lower Body Dressing: Total assistance Toilet Transfer: +2 for physical assistance, Maximal assistance, BSC Toilet Transfer Details (indicate cue type and reason): elevated surface required and unable to sustain static standing. simulated sit<.stand eob. Recommend BSC placed behind patient with bed swung away Toileting- Clothing Manipulation and Hygiene: Total assistance, +2 for physical assistance Functional mobility during ADLs: Maximal assistance, +2 for physical assistance General ADL Comments: Stedy used for transfer; educated  pt on back precautions; handout issued; total A to donn brace; pt's attention limited by pain  Cognition: Cognition Overall Cognitive Status: Within Functional Limits for tasks assessed Orientation Level: Oriented X4 Cognition Arousal/Alertness: Awake/alert Behavior During Therapy: WFL for tasks assessed/performed Overall Cognitive Status: Within Functional Limits for tasks assessed  Blood pressure 130/80, pulse (!) 133, temperature 97.8 F (36.6 C), temperature source Oral, resp. rate 19, height 5\' 10"   (1.778 m), weight 93.8 kg, SpO2 (!) 89 %. Physical Exam  Nursing note and vitals reviewed. Constitutional: He is oriented to person, place, and time. He appears well-developed and well-nourished.  Continues to report pain.  Anxious and easily frustrated.    Neurological: He is alert and oriented to person, place, and time.    Results for orders placed or performed during the hospital encounter of 06/04/18 (from the past 24 hour(s))  CBC with Differential/Platelet     Status: Abnormal   Collection Time: 06/17/18  3:45 AM  Result Value Ref Range   WBC 11.3 (H) 4.0 - 10.5 K/uL   RBC 2.63 (L) 4.22 - 5.81 MIL/uL   Hemoglobin 7.7 (L) 13.0 - 17.0 g/dL   HCT 24.6 (L) 39.0 - 52.0 %   MCV 93.5 80.0 - 100.0 fL   MCH 29.3 26.0 - 34.0 pg   MCHC 31.3 30.0 - 36.0 g/dL   RDW 14.9 11.5 - 15.5 %   Platelets 284 150 - 400 K/uL   nRBC 0.0 0.0 - 0.2 %   Neutrophils Relative % 78 %   Neutro Abs 8.9 (H) 1.7 - 7.7 K/uL   Lymphocytes Relative 12 %   Lymphs Abs 1.3 0.7 - 4.0 K/uL   Monocytes Relative 7 %   Monocytes Absolute 0.8 0.1 - 1.0 K/uL   Eosinophils Relative 1 %   Eosinophils Absolute 0.1 0.0 - 0.5 K/uL   Basophils Relative 0 %   Basophils Absolute 0.0 0.0 - 0.1 K/uL   Immature Granulocytes 2 %   Abs Immature Granulocytes 0.23 (H) 0.00 - 0.07 K/uL   No results found.   Assessment/Plan: Diagnosis: Functional deficits secondary to diffusely metastatic lung cancer which has involved L4 vertebral body. Pt underwent L4 corpectomy and fusion.  1. Does the need for close, 24 hr/day medical supervision in concert with the patient's rehab needs make it unreasonable for this patient to be served in a less intensive setting? Yes 2. Co-Morbidities requiring supervision/potential complications: pain mgt, oncological considerations, post-op considerations 3. Due to bladder management, bowel management, safety, skin/wound care, disease management, medication administration, pain management and patient  education, does the patient require 24 hr/day rehab nursing? Yes 4. Does the patient require coordinated care of a physician, rehab nurse, PT (1-2 hrs/day, 5 days/week) and OT (1-2 hrs/day, 5 days/week) to address physical and functional deficits in the context of the above medical diagnosis(es)? Yes Addressing deficits in the following areas: balance, endurance, locomotion, strength, transferring, bowel/bladder control, bathing, dressing, feeding, grooming and toileting 5. Can the patient actively participate in an intensive therapy program of at least 3 hrs of therapy per day at least 5 days per week? Yes 6. The potential for patient to make measurable gains while on inpatient rehab is excellent 7. Anticipated functional outcomes upon discharge from inpatient rehab are supervision and min assist  with PT, supervision and min assist with OT, n/a with SLP. 8. Estimated rehab length of stay to reach the above functional goals is: 15-20 days 9. Anticipated D/C setting: Home 10. Anticipated post D/C  treatments: [facility] therapy 11. Overall Rehab/Functional Prognosis: excellent  RECOMMENDATIONS: This patient's condition is appropriate for continued rehabilitative care in the following setting: CIR Patient has agreed to participate in recommended program. Yes Note that insurance prior authorization may be required for reimbursement for recommended care.  Comment: Rehab Admissions Coordinator to follow up.  Thanks,  Meredith Staggers, MD, Mellody Drown      Bary Leriche, PA-C 06/17/2018

## 2018-06-18 ENCOUNTER — Inpatient Hospital Stay (HOSPITAL_COMMUNITY): Payer: Medicare Other

## 2018-06-18 MED ORDER — POLYETHYLENE GLYCOL 3350 17 G PO PACK
17.0000 g | PACK | Freq: Every day | ORAL | Status: DC
  Administered 2018-06-19: 17 g via ORAL
  Filled 2018-06-18 (×5): qty 1

## 2018-06-18 MED ORDER — GADOBUTROL 1 MMOL/ML IV SOLN
9.0000 mL | Freq: Once | INTRAVENOUS | Status: AC | PRN
  Administered 2018-06-18: 9 mL via INTRAVENOUS

## 2018-06-18 NOTE — Progress Notes (Signed)
VAST RN called unit RN in regards to consult for PIV placement. Inquired as to his current mental status, as pt has removed several PIV's over the past few days. Unit RN unaware of IV losses d/t pt removing PIV's. Unit RN reported that this am he was confused and his PIV was lying in the bed along with his oxygen tubing. She stated he is much more coherent now  and felt he did not need mitts on his hands at this time, but stated if his wife left the bedside, she would consider them.

## 2018-06-18 NOTE — Progress Notes (Signed)
PROGRESS NOTE    Brian [name]  WUJ:[id number] DOB: 02-13-1946 DOA: 06/04/2018 PCP: [name], MD   Brief Narrative: Patient is a 72 year old male with past medical history  Rheumatoid arthritis, COPD, tobacco abuse who was admitted on 06/04/2018 for management of low back pain.  He was found to have pathological L4 fracture .CT chest showed right lower lobe mass concerning for bronchogenic carcinoma with metastasis to bone.  He underwent L4 corpectomy, posterior spinal fusion L3-L5  by orthopedic surgery 06/11/2018.  During the procedure he also received 4 units of PRBC and 2 units of FFP.  He was intubated during the surgery and was being managed by PCCM.  Extubated on 12/23. Medicine was consulted for the management of nausea and other medical problems.   Assessment & Plan:   Principal Problem:   Lung cancer metastatic to bone [facility]) Active Problems:   Lumbar spine tumor   Bronchitis due to tobacco use   Lung cancer, primary, with metastasis from lung to other site, right (HCC)   Squamous cell lung cancer, right (HCC)  Nausea: Patient looks comfortable.Denied vomiting. Nausea has much improved. Continue PRN Phenergan.  Diet has been started.  Continue PPI.  Last bowel movement on 06/16/18.Continue bowel regimen. Minimize pain medications.  Encouraged ambulation.  Pathological fracture/back pain: Secondary to metastatic lung cancer.  CT imaging showed right lower lobe mass.  Destructive lesions in the left clavicle, right seventh rib and L4 vertebral body.  Mediastinal lymph nodes measuring up to 1.5 cm.  2 lesions in the liver.  MRI of the brain did not reveal any metastatic disease.  MRI of the lumbar spine confirmed metastatic lesion of the L4 vertebral body with associated pathological fracture.  Underwent L4 corpectomy, posterior spinal fusion L3-L5  by orthopedic surgery 06/11/2018.  Management as per orthopedic space surgery.  Physical therapy consulted.  Recommended  CIR.  Acute hypoxic respiratory failure: Had to be intubated during surgery.  Currently respiratory status stable.Continue weaning oxygen to off.Lungs are clear on auscultation.  Right lower lobe mass/stage IV non-small cell lung cancer:Oncology following.  Found to have squamous cell carcinoma as per pathology report.  Oncology sending molecular profiling for consideration of chemotherapy.  Also planning for radiation therapy.  Anemia: Likely associated with malignancy, surgery .low iron.  Status post iron infusion.Last Hb is 7.7  . Not  Started on oral iron supplementation because of nausea.  Cough: Continue mucolytic's.  Continue with incentive spirometry.  Encourage ambulation.  Chest x-ray shows chronic interstitial lung disease, right lower lobe mass and left lower lobe atelectasis.  No pneumonia.  Continue PRN bronchodilators.  Rheumatoid arthritis: Follow-up with his rheumatologist outpatient.  Thank you for the consultation.  Medicine will sign off.  Please   call us again  if necessary.  Looks like he is stable for discharge to CIR/skilled nursing facility whenever possible.  Nutrition Problem: Increased nutrient needs Etiology: cancer and cancer related treatments      DVT prophylaxis:Lovenox Code Status: Full Family Communication: Wife present at the bedside Disposition Plan: Likely CIR .  Discharge planning as per orthopedics.  He is medically cleared for discharge to CIR whenever possible   Procedures: L4 corpectomy, posterior spinal fusion L3-L5  Antimicrobials: None  Subjective: Patient seen and examined the bedside this morning.  Remains hemodynamically stable.  Denies any abdominal pain.  Complains of  pain on the back on ambulation.  Nausea is much better.  Last bowel movement on 12/25.  Objective: Vitals:  06/17/18 2334 06/18/18 0400 06/18/18 0840 06/18/18 1122  BP: (!) 147/84 135/76 (!) 124/57   Pulse: 99 83 92   Resp:      Temp: 98.3 F (36.8 C) 98.1 F  (36.7 C) 98.1 F (36.7 C) (!) 97.3 F (36.3 C)  TempSrc: Oral Oral Oral Oral  SpO2: 93% 92% 95%   Weight:      Height:        Intake/Output Summary (Last 24 hours) at 06/18/2018 1147 Last data filed at 06/18/2018 0846 Gross per 24 hour  Intake 2109.68 ml  Output 3075 ml  Net -965.32 ml   Filed Weights   06/11/18 2000 06/12/18 0500 06/13/18 0500  Weight: 92.1 kg 93 kg 93.8 kg    Examination:  General exam: Appears calm and comfortable ,Not in distress,average built HEENT:PERRL,Oral mucosa moist, Ear/Nose normal on gross exam Respiratory system: Bilateral clear Cardiovascular system: S1 & S2 heard, RRR. No JVD, murmurs, rubs, gallops or clicks. No pedal edema. Gastrointestinal system: Abdomen is nondistended, soft and nontender. No organomegaly or masses felt.  Active bowel sounds heard.  Central nervous system: Alert and oriented. No focal neurological deficits. Extremities: No edema, no clubbing ,no cyanosis, distal peripheral pulses palpable. Skin: No rashes, lesions or ulcers,no icterus ,no pallor MSK: Normal muscle bulk,tone ,power, clean surgical scars on the back Psychiatry: Judgement and insight appear normal. Mood & affect appropriate.     Data Reviewed: I have personally reviewed following labs and imaging studies  CBC: Recent Labs  Lab 06/13/18 0544 06/13/18 1215 06/14/18 0538 06/15/18 0214 06/16/18 0940 06/17/18 0345  WBC 14.8*  --  15.3* 11.3* 12.4* 11.3*  NEUTROABS 12.1*  --   --   --  9.1* 8.9*  HGB 9.7* 7.5* 8.9* 7.8* 8.2* 7.7*  HCT 28.8* 22.0* 26.8* 24.4* 24.9* 24.6*  MCV 92.6  --  94.0 96.1 91.2 93.5  PLT 181  --  205 224 259 119   Basic Metabolic Panel: Recent Labs  Lab 06/12/18 0456 06/13/18 0544 06/13/18 1215 06/13/18 1839 06/14/18 0538 06/15/18 0214  NA 137 138 139 134* 138 140  K 3.5 3.2* 3.2* 3.7 3.5 4.6  CL 107 108  --  104 106 107  CO2 20* 21*  --  22 21* 26  GLUCOSE 107* 113*  --  144* 108* 97  BUN 12 8  --  7* 7* 8   CREATININE 0.82 0.77  --  0.62 0.71 0.82  CALCIUM 7.3* 7.0*  --  6.7* 6.7* 6.9*  MG  --  2.1  --  2.2  --   --   PHOS  --  2.3*  --  3.0  --   --    GFR: Estimated Creatinine Clearance: 92.3 mL/min (by C-G formula based on SCr of 0.82 mg/dL). Liver Function Tests: No results for input(s): AST, ALT, ALKPHOS, BILITOT, PROT, ALBUMIN in the last 168 hours. No results for input(s): LIPASE, AMYLASE in the last 168 hours. No results for input(s): AMMONIA in the last 168 hours. Coagulation Profile: Recent Labs  Lab 06/12/18 0456 06/14/18 0538  INR 1.16 1.12   Cardiac Enzymes: Recent Labs  Lab 06/13/18 1839  TROPONINI <0.03   BNP (last 3 results) No results for input(s): PROBNP in the last 8760 hours. HbA1C: No results for input(s): HGBA1C in the last 72 hours. CBG: Recent Labs  Lab 06/13/18 2310 06/14/18 0302 06/14/18 0735 06/14/18 1141 06/14/18 1529  GLUCAP 128* 105* 99 123* 102*   Lipid Profile: No results  for input(s): CHOL, HDL, LDLCALC, TRIG, CHOLHDL, LDLDIRECT in the last 72 hours. Thyroid Function Tests: No results for input(s): TSH, T4TOTAL, FREET4, T3FREE, THYROIDAB in the last 72 hours. Anemia Panel: No results for input(s): VITAMINB12, FOLATE, FERRITIN, TIBC, IRON, RETICCTPCT in the last 72 hours. Sepsis Labs: No results for input(s): PROCALCITON, LATICACIDVEN in the last 168 hours.  Recent Results (from the past 240 hour(s))  MRSA PCR Screening     Status: None   Collection Time: 06/09/18 10:50 AM  Result Value Ref Range Status   MRSA by PCR NEGATIVE NEGATIVE Final    Comment:        The GeneXpert MRSA Assay (FDA approved for NASAL specimens only), is one component of a comprehensive MRSA colonization surveillance program. It is not intended to diagnose MRSA infection nor to guide or monitor treatment for MRSA infections. Performed at [facility], [place] [address], [place], [place] [address]          Radiology Studies: No results  found.      Scheduled Meds: . chlorhexidine gluconate (MEDLINE KIT)  15 mL Mouth Rinse BID  . enoxaparin (LOVENOX) injection  40 mg Subcutaneous Q24H  . famotidine  20 mg Oral BID  . feeding supplement (ENSURE ENLIVE)  237 mL Oral BID BM  . folic acid  2 mg Oral Daily  . guaiFENesin  10 mL Oral Q6H  . mouth rinse  15 mL Mouth Rinse BID  . polyethylene glycol  17 g Oral Daily   Continuous Infusions: . lactated ringers Stopped (06/18/18 0755)  . methocarbamol (ROBAXIN) IV 500 mg (06/17/18 1259)  . ondansetron (ZOFRAN) IV    . sodium chloride       LOS: 14 days    Time spent:35 mins. More than 50% of that time was spent in counseling and/or coordination of care.      Shelly Coss, MD [facility] Pager [phone]  If 7PM-7AM, please contact night-coverage [email or web address] Password [facility] 06/18/2018, 11:47 AM

## 2018-06-18 NOTE — Progress Notes (Signed)
PT Cancellation Note  Patient Details Name: Brian [name] MRN: [id number] DOB: 1946-02-24   Cancelled Treatment:    Reason Eval/Treat Not Completed: Pain limiting ability to participate; spouse and RN reports patient with severe pain earlier and has taken much of the day to get under control.  Will check back another day.   Reginia Naas 06/18/2018, 4:27 PM Magda Kiel, [place] [phone] 06/18/2018

## 2018-06-18 NOTE — PMR Pre-admission (Signed)
PMR Admission Coordinator Pre-Admission Assessment  Patient: Brian [name] is an 72 y.o., male MRN: [id number] DOB: April 01, 1946 Height: '5\' 10"'$  (177.8 cm) Weight: 93.8 kg              Insurance Information HMO:     PPO:      PCP:      IPA:      80/20:  yes   OTHER:  PRIMARY:   Medicare Part A and B    Policy#: [id number]      Subscriber: Patient CM Name:       Phone#:      Fax#:  Pre-Cert#:       Employer:  Benefits:  Phone #: Automated system     Name: Verified eligibility on 06/28/18 via [employer]. Date: part A effective: 05/23/10; Part B effective 05/23/10     Deduct: $1,408      Out of Pocket Max: NA      Life Max: NA CIR:  Covered per Medicare guidelines once yearly deductible is met.      SNF: days 1-20, 100%; days 21-100, 80% Outpatient: 80%     Co-Pay: 20% Home Health: 100%      Co-Pay:  DME: 80%     Co-Pay: 20% Providers: Pt's choice SECONDARY: BCBS      Policy#:  [id number]     Subscriber: Patient CM Name:       Phone#:      Fax#:  Pre-Cert#:       Employer:  Benefits:  Phone #: [phone]     Name:  Eff. Date:      Deduct:       Out of Pocket Max:       Life Max:  CIR:       SNF:  Outpatient:      Co-Pay:  Home Health:       Co-Pay:  DME:      Co-Pay:   Medicaid Application Date:       Case Manager: Disability Application Date:       Case Worker:   Emergency Contact Information Contact Information    Name Relation Home Work [place] Spouse [phone]  [phone]     Current Medical History  Patient Admitting Diagnosis: Functional deficits secondary to diffusely metastatic lung cancer which has involved L4 vertebral body. Pt underwent L4 corpectomy and fusion.   History of Present Illness: Brian [name] is a 72 y.o. male with history of RA, emphysema, tobacco abuse with low back and hip pain. Work up done revealing of bronchogenic RLL lung cancer with bony mets to left clavicle, right 8th rib, L4 vertebral body with  fracture and 2.5 cm liver lesion. He was admitted form office for work up on 06/06/18 and surgical intervention of pathologic fracture recommended by Dr. Marin Olp. He underwent L4 corpectomy with L3-L5 anterior fusion by Dr.Brooks on 12/20. Perioperatively with blood loss with hematoma at corpectomy site requiring 4 units  PRBC and 2 units FFP. Surgery stopped due to bleeding and CTA abdomen/pelvis revealed small retroperitoneal hematoma win right lateral spine extending to psoas muscle. Once stabilized, he was taken back to OR on 12/22 for screw fixation L3 - L5.   Pathology reveled metastatic squamous cell lung cancer and plans for XRT in the future.  H/H being monitored and he continues on full liquid diet due to constipation. Post op with BLE weakness as well as pain affecting mobility and self care tasks. CIR  recommended due to functional deficits.   Pt had acute onset of right thigh spasm and pain on 12/27 with MRI revealing possible mass effect from hematoma on the psoas. Pt has provided with palliative care consult for overall pain management. Pt has LE weakness with orthopedics deciding on doing posterior gill decompression on 1/02. Per Dr. Rolena Infante, the plan is for pt to be followed by oncology and once the wounds have healed, the patient will have radiation therapy to the left clavicle and right seventh rib. Post op, pt appears to be doing better and progressing with therapies. CIR still recommended due to continued functional deficits.        Past Medical History  Past Medical History:  Diagnosis Date  . Arthritis     Family History  family history includes Diabetes in his mother and sister; Heart attack in his father; Obesity in his father.  Prior Rehab/Hospitalizations:  Has the patient had major surgery during 100 days prior to admission? No  Current Medications   Current Facility-Administered Medications:  .  0.9 %  sodium chloride infusion, 250 mL, Intravenous, Continuous,  Melina Schools, MD, Last Rate: 1 mL/hr at 06/24/18 1521, 250 mL at 06/24/18 1521 .  acetaminophen (TYLENOL) tablet 650 mg, 650 mg, Oral, Q4H PRN, 650 mg at 06/29/18 0127 **OR** acetaminophen (TYLENOL) suppository 650 mg, 650 mg, Rectal, Q4H PRN, Melina Schools, MD .  albuterol (PROVENTIL) (2.5 MG/3ML) 0.083% nebulizer solution 2.5 mg, 2.5 mg, Nebulization, Q6H PRN, Melina Schools, MD, 2.5 mg at 06/18/18 1317 .  bisacodyl (DULCOLAX) suppository 10 mg, 10 mg, Rectal, Daily PRN, Samuella Cota, MD, 10 mg at 06/25/18 1443 .  clobetasol ointment (TEMOVATE) 0.05 %, , Topical, BID, Rai, Ripudeep K, MD .  feeding supplement (ENSURE ENLIVE) (ENSURE ENLIVE) liquid 237 mL, 237 mL, Oral, BID BM, Melina Schools, MD, 237 mL at 06/27/18 1018 .  fentaNYL (DURAGESIC - dosed mcg/hr) 12.5 mcg, 12.5 mcg, Transdermal, Q72H, Basilio Cairo, NP, 12.5 mcg at 06/27/18 1707 .  gabapentin (NEURONTIN) capsule 300 mg, 300 mg, Oral, TID, Melina Schools, MD, 300 mg at 06/29/18 0919 .  guaiFENesin (ROBITUSSIN) 100 MG/5ML solution 200 mg, 10 mL, Oral, Q6H, Melina Schools, MD, 200 mg at 06/29/18 1155 .  HYDROcodone-acetaminophen (NORCO) 7.5-325 MG per tablet 1 tablet, 1 tablet, Oral, Q8H PRN, Constable, Amber, PA-C, 1 tablet at 06/29/18 0919 .  HYDROmorphone HCl (DILAUDID) liquid 1 mg, 1 mg, Oral, Q4H PRN, Basilio Cairo, NP, 1 mg at 06/29/18 1152 .  menthol-cetylpyridinium (CEPACOL) lozenge 3 mg, 1 lozenge, Oral, PRN **OR** phenol (CHLORASEPTIC) mouth spray 1 spray, 1 spray, Mouth/Throat, PRN, Melina Schools, MD .  methocarbamol (ROBAXIN) tablet 500 mg, 500 mg, Oral, Q6H PRN, 500 mg at 06/29/18 1151 **OR** [DISCONTINUED] methocarbamol (ROBAXIN) 500 mg in dextrose 5 % 50 mL IVPB, 500 mg, Intravenous, Q6H PRN, Melina Schools, MD, Last Rate: 100 mL/hr at 06/25/18 0716, 500 mg at 06/25/18 0716 .  ondansetron (ZOFRAN) tablet 4 mg, 4 mg, Oral, Q6H PRN **OR** [DISCONTINUED] ondansetron (ZOFRAN) injection 4 mg, 4 mg, Intravenous, Q6H PRN,  Melina Schools, MD .  pantoprazole (PROTONIX) EC tablet 40 mg, 40 mg, Oral, Daily, Masters, Jake Church, RPH, 40 mg at 06/28/18 2033 .  polyethylene glycol (MIRALAX / GLYCOLAX) packet 17 g, 17 g, Oral, Daily, Ihor Dow M, NP, 17 g at 06/29/18 0919 .  senna (SENOKOT) tablet 8.6 mg, 1 tablet, Oral, Daily, Ihor Dow M, NP, 8.6 mg at 06/29/18 0919 .  sodium chloride flush (  NS) 0.9 % injection 3 mL, 3 mL, Intravenous, Q12H, Melina Schools, MD, 3 mL at 06/29/18 0920 .  sodium chloride flush (NS) 0.9 % injection 3 mL, 3 mL, Intravenous, PRN, Melina Schools, MD .  sodium phosphate (FLEET) 7-19 GM/118ML enema 1 enema, 1 enema, Rectal, Once PRN, Melina Schools, MD  Patients Current Diet:  Diet Order            Diet regular Room service appropriate? Yes; Fluid consistency: Thin  Diet effective now              Precautions / Restrictions Precautions Precautions: Back Precaution Booklet Issued: Yes (comment) Precaution Comments: re-educated on BLT, pt cont to be reminded during function Spinal Brace: Thoracolumbosacral orthotic, Applied in sitting position Restrictions Weight Bearing Restrictions: No   Has the patient had 2 or more falls or a fall with injury in the past year?No  Prior Activity Level Limited Community (1-2x/wk): retired PTA; has not driven in last 3-4 months due to pain; has had decrease in activity level in past two months due to pain.   Home Assistive Devices / Equipment Home Assistive Devices/Equipment: Eyeglasses, Radio producer (specify quad or straight), Walker (specify type)(front wheeled walker, single point cane) Home Equipment: Walker - 2 wheels, Grab bars - tub/shower, Shower seat, Hand held shower head, Adaptive equipment  Prior Device Use: Indicate devices/aids used by the patient prior to current illness, exacerbation or injury? Walker for the past two months during increase in symptoms and pain.   Prior Functional Level Prior Function Level of Independence: Needs  assistance Gait / Transfers Assistance Needed: walker at baseline ADL's / Homemaking Assistance Needed: sitting with wife helping with back and buttock  Self Care: Did the patient need help bathing, dressing, using the toilet or eating?  Needed some help; Independent prior to last two months, then required gradually more assistance due to pain.   Indoor Mobility: Did the patient need assistance with walking from room to room (with or without device)? Independent  Stairs: Did the patient need assistance with internal or external stairs (with or without device)? Needed some help  Functional Cognition: Did the patient need help planning regular tasks such as shopping or remembering to take medications? Independent  Current Functional Level Cognition  Overall Cognitive Status: Within Functional Limits for tasks assessed Orientation Level: Oriented X4 General Comments: patient with depressed spirits, frustrated throughout session    Extremity Assessment (includes Sensation/Coordination)  Upper Extremity Assessment: LUE deficits/detail LUE Deficits / Details: edema in L forarm, elbow  Lower Extremity Assessment: RLE deficits/detail, LLE deficits/detail RLE Deficits / Details: edema in R thigh/knee, strength hip flexion 3-/5, knee extension 4/5, ankle DF 4+.5 RLE Sensation: WNL LLE Deficits / Details: AROM WFL, strength hip flexion 3/5, knee extension 4+/5, ankle DF 4+/5 LLE Sensation: WNL    ADLs  Overall ADL's : Needs assistance/impaired Eating/Feeding: Set up Grooming: Minimal assistance, Standing, Wash/dry hands, Wash/dry face Grooming Details (indicate cue type and reason): cueing to maintain posture and precautions, requires 1 hand support Upper Body Bathing: Moderate assistance Lower Body Bathing: Total assistance Upper Body Dressing : Maximal assistance Lower Body Dressing: Maximal assistance, Sitting/lateral leans, +2 for physical assistance Lower Body Dressing Details  (indicate cue type and reason): initated educated on AE, demosntrating ability to don/doff B socks using reacher/sock aide with supervision, min cueing for precautions Toilet Transfer: Moderate assistance, +2 for physical assistance, RW, Ambulation Toilet Transfer Details (indicate cue type and reason): simulated, cueing for hand placement and safety  Toileting- Clothing Manipulation and Hygiene: Total assistance, +2 for physical assistance Toileting - Clothing Manipulation Details (indicate cue type and reason): +2 assist to squat in stedy in order to attempt cleaning after incontinent BM Functional mobility during ADLs: Moderate assistance, +2 for physical assistance General ADL Comments: attempted stedy for transfer, pt fatigue and unable to raise hips off chair more than 20 degrees given max assist +2     Mobility  Overal bed mobility: Needs Assistance Bed Mobility: Rolling, Sidelying to Sit Rolling: Min guard Sidelying to sit: Min assist Supine to sit: Max assist Sit to supine: Max assist, +2 for physical assistance Sit to sidelying: Mod assist General bed mobility comments: minA for trunk elevation, verbal cues for log roll and not twisting    Transfers  Overall transfer level: Needs assistance Equipment used: Rolling walker (2 wheeled) Transfer via Lift Equipment: Stedy Transfers: Sit to/from [employer] to Stand: Min assist, Mod assist, +2 physical assistance Stand pivot transfers: (Used Stedy for bed to chair) General transfer comment: verbal cues to push up from bed, min/modA to power up and steady during transition of hands from bed to [employer]    Ambulation / Gait / Stairs / Emergency planning/management officer  Ambulation/Gait Ambulation/Gait assistance: Min assist, +2 safety/equipment Gait Distance (Feet): 100 Feet(x2) Assistive device: Rolling walker (2 wheeled) Gait Pattern/deviations: Step-through pattern, Decreased step length - right, Decreased step length - left, Trunk flexed General Gait  Details: c/o fatigue however improved from yesterday Gait velocity: dec Gait velocity interpretation: <1.31 ft/sec, indicative of household ambulator    Posture / Balance Dynamic Sitting Balance Sitting balance - Comments: able to tolerate sitting EOB without UE support x 15 sec. sitting balance improving Balance Overall balance assessment: Needs assistance Sitting-balance support: Feet supported Sitting balance-Leahy Scale: Fair Sitting balance - Comments: able to tolerate sitting EOB without UE support x 15 sec. sitting balance improving Standing balance support: Single extremity supported, During functional activity Standing balance-Leahy Scale: Fair Standing balance comment: dependent on RW    Special needs/care consideration BiPAP/CPAP: no CPM: no Continuous Drip IV: 0.9% sodium chloride infusion  Dialysis: no        Days: no Life Vest: no Oxygen: no, RA Special Bed: no Trach Size: no Wound Vac (area): no      Location:no Skin: surgical incision to back and flank; rash to back and abdomen Bowel mgmt:last BM: 06/27/18 continent Bladder mgmt: continent Diabetic mgmt: no     Previous Home Environment Living Arrangements: Spouse/significant other Available Help at Discharge: Family, Available 24 hours/day Type of Home: House Home Layout: One level Home Access: Stairs to enter Entrance Stairs-Rails: Right Entrance Stairs-Number of Steps: 6 Bathroom Shower/Tub: Multimedia programmer: Standard Home Care Services: No Additional Comments: two cats- within the home but 10 total in the house but are in a special room in the house  Discharge Living Setting Plans for Discharge Living Setting: Patient's home, Lives with (comment)(wife) Type of Home at Discharge: House Discharge Home Layout: Two level, Able to live on main level with bedroom/bathroom Alternate Level Stairs-Number of Steps: NA Discharge Home Access: Stairs to enter Entrance Stairs-Rails: Right Entrance  Stairs-Number of Steps: 6 Discharge Bathroom Shower/Tub: Walk-in shower(with stool and suction cup grab bars) Discharge Bathroom Toilet: Standard Discharge Bathroom Accessibility: Yes How Accessible: Accessible via walker Does the patient have any problems obtaining your medications?: No  Social/Family/Support Systems Patient Roles: Spouse [name]) Contact Information: wife [phone]) Anticipated Caregiver: wife and neighbors can check in  Anticipated Caregiver's  Contact Information: see above Ability/Limitations of Caregiver: Min A Caregiver Availability: 24/7 Discharge Plan Discussed with Primary Caregiver: Yes Is Caregiver In Agreement with Plan?: Yes Does Caregiver/Family have Issues with Lodging/Transportation while Pt is in Rehab?: No   Goals/Additional Needs Patient/Family Goal for Rehab: PT/OT: Supervision/Min A; SLP: NA Expected length of stay: 15-20 days Cultural Considerations: NA Dietary Needs: regular diet, thin liquids Equipment Needs: TBD Pt/Family Agrees to Admission and willing to participate: Yes Program Orientation Provided & Reviewed with Pt/Caregiver Including Roles  & Responsibilities: Yes(with pt and his wife)  Barriers to Discharge: Inaccessible home environment, Medical stability, Home environment access/layout, Pending chemo/radiation, New oxygen  Barriers to Discharge Comments: tub shower, pending radiation?, pain control Per Dr. Rolena Infante, the plan is for pt to be followed by oncology and once the wounds have healed, the patient will have radiation therapy to the left clavicle and right seventh rib.   Decrease burden of Care through IP rehab admission: NA    Possible need for SNF placement upon discharge: Not anticipated; pt has good social support at DC with wife able to provide anticipated level of assist at DC from CIR.    Patient Condition: This patient's medical and functional status has changed since the consult dated: 06/17/18 in which the  Rehabilitation Physician determined and documented that the patient's condition is appropriate for intensive rehabilitative care in an inpatient rehabilitation facility. See "History of Present Illness" (above) for medical update. Functional changes are: progression in ADL status from Max x2 to Mod A x2 for toilet transfers, progression in transfer status from Mod Ax2 to Min/Mod Ax2 and progression from no gait to 75 feet with Min A x2. Patient's medical and functional status update has been discussed with the Rehabilitation physician and patient remains appropriate for inpatient rehabilitation. Will admit to inpatient rehab today.  Preadmission Screen Completed By:  Jhonnie Garner, 06/29/2018 12:25 PM ______________________________________________________________________   Discussed status with Dr. Naaman Plummer on 06/29/18 at 12:23PM and received telephone approval for admission today.  Admission Coordinator:  Jhonnie Garner, time 12:23PM/Date 06/29/18.

## 2018-06-18 NOTE — Progress Notes (Signed)
    Subjective: Procedure(s) (LRB): POSTERIOR SPINAL FUSION L3-L5 (N/A) 5 Days Post-Op  Patient reports pain as 8 on 0-10 scale and Acute onset of right thigh spasm and pain.  Reports increased leg pain reports incisional back pain   Positive void Positive bowel movement Positive flatus Negative chest pain or shortness of breath  Objective: Vital signs in last 24 hours: Temp:  [97.3 F (36.3 C)-98.3 F (36.8 C)] 97.3 F (36.3 C) (12/27 1122) Pulse Rate:  [83-99] 92 (12/27 0840) Resp:  [18-22] 22 (12/26 1600) BP: (124-147)/(57-84) 124/57 (12/27 0840) SpO2:  [92 %-97 %] 95 % (12/27 0840)  Intake/Output from previous day: 12/26 0701 - 12/27 0700 In: 3799.1 [P.O.:600; I.V.:3047.8; IV Piggyback:151.3] Out: 2825 [Urine:2825]  Labs: Recent Labs    06/16/18 0940 06/17/18 0345  WBC 12.4* 11.3*  RBC 2.73* 2.63*  HCT 24.9* 24.6*  PLT 259 284   No results for input(s): NA, K, CL, CO2, BUN, CREATININE, GLUCOSE, CALCIUM in the last 72 hours. No results for input(s): LABPT, INR in the last 72 hours.  Physical Exam: Intact pulses distally Incision: dressing C/D/I Compartment soft Patient with acute right leg pain, spasm Body mass index is 29.67 kg/m.   Neuro: 5/5 EHL/TA/EHL bilaterally.  Left hip/quad strength intact.  Unable to asses right hip flexor/quad due to acute pain  Assessment/Plan: Patient stable  xrays n/a 1. Patient continues to have episodic pain.  Concern about ongoing radicular pain due to nerve compression from tumor mass.  Possible psoas irritation from hematoma.  Will order MRI with contrast to eval the area.  May require formal open posterior decompression 2. Chronic pain management/pallative care consult requested for improved overall pain management options 3. Recommendations pending MRI  Melina Schools, MD [facility] [phone]

## 2018-06-18 NOTE — Progress Notes (Signed)
Pt complain of severe, sudden onset of right hip/buttock pain.  Pt is noted to have increased redness in right lower abd and back area that feels slightly warm to touch and taunt.  Pt given IV Dilaudid with no relief, pain still 10/10.  Dr. Rolena Infante office called, however he happened to walk on the unit immediately after.  Dr. Rolena Infante assessed pt and will provide new orders accordingly.

## 2018-06-18 NOTE — Progress Notes (Signed)
Inpatient Rehabilitation-Admissions Coordinator    Met with patient and his wife at the bedside to discuss team's recommendation for inpatient rehabilitation. Shared booklets, expectations while in CIR, expected length of stay, and anticipated functional level at DC.Pt questioning tolerance for CIR. While in room, pt had onset of intense pain. RN notified. Pt still requiring IV dilaudid for pain management.   AC will follow for medical readiness and tolerance.   Jhonnie Garner, OTR/L  Rehab Admissions Coordinator  [phone] 06/18/2018 12:04 PM

## 2018-06-18 NOTE — Progress Notes (Signed)
Unable to reach Melina Schools, MD [facility] [phone] (unable to process calls when called)  Order for Lumbar with contrast needs to be corrected to lumbar without contrast or Lumbar with and without contrast before patient comes for MRI.  Contacted Dr. Tawanna Solo to let him know and he is unable to change the order.

## 2018-06-19 LAB — CBC WITH DIFFERENTIAL/PLATELET
ABS IMMATURE GRANULOCYTES: 0.42 10*3/uL — AB (ref 0.00–0.07)
Basophils Absolute: 0.1 10*3/uL (ref 0.0–0.1)
Basophils Relative: 1 %
Eosinophils Absolute: 0.1 10*3/uL (ref 0.0–0.5)
Eosinophils Relative: 1 %
HCT: 23.3 % — ABNORMAL LOW (ref 39.0–52.0)
Hemoglobin: 7.7 g/dL — ABNORMAL LOW (ref 13.0–17.0)
Immature Granulocytes: 3 %
LYMPHS ABS: 2.1 10*3/uL (ref 0.7–4.0)
Lymphocytes Relative: 15 %
MCH: 31 pg (ref 26.0–34.0)
MCHC: 33 g/dL (ref 30.0–36.0)
MCV: 94 fL (ref 80.0–100.0)
Monocytes Absolute: 1.1 10*3/uL — ABNORMAL HIGH (ref 0.1–1.0)
Monocytes Relative: 8 %
NEUTROS ABS: 9.8 10*3/uL — AB (ref 1.7–7.7)
Neutrophils Relative %: 72 %
Platelets: 358 10*3/uL (ref 150–400)
RBC: 2.48 MIL/uL — ABNORMAL LOW (ref 4.22–5.81)
RDW: 15.7 % — ABNORMAL HIGH (ref 11.5–15.5)
WBC: 13.5 10*3/uL — ABNORMAL HIGH (ref 4.0–10.5)
nRBC: 0 % (ref 0.0–0.2)

## 2018-06-19 NOTE — Progress Notes (Addendum)
    Subjective: Procedure(s) (LRB): POSTERIOR SPINAL FUSION L3-L5 (N/A) 6 Days Post-Op  Patient reports pain as 8 on 0-10 scale and Acute onset of right thigh spasm and pain.  Reports increased leg pain and groin pain reports incisional back pain   Positive void Positive bowel movement Positive flatus Negative chest pain or shortness of breath  Objective: Vital signs in last 24 hours: Temp:  [97.3 F (36.3 C)-99.8 F (37.7 C)] 98.8 F (37.1 C) (12/28 0300) Pulse Rate:  [95] 95 (12/27 1602) BP: (130-135)/(80-85) 130/85 (12/28 0300) SpO2:  [96 %] 96 % (12/27 1602)  Intake/Output from previous day: 12/27 0701 - 12/28 0700 In: 1630 [P.O.:480; I.V.:1150] Out: 1635 [Urine:1635]  Labs: Recent Labs    06/17/18 0345 06/19/18 0448  WBC 11.3* 13.5*  RBC 2.63* 2.48*  HCT 24.6* 23.3*  PLT 284 358   No results for input(s): NA, K, CL, CO2, BUN, CREATININE, GLUCOSE, CALCIUM in the last 72 hours. No results for input(s): LABPT, INR in the last 72 hours.  Physical Exam: Intact pulses distally Incision: dressing C/D/I Compartment soft Patient with acute right leg pain, spasm Body mass index is 29.67 kg/m.   Neuro: 5/5 EHL/TA/EHL bilaterally.  Left hip/quad strength intact.  Pain with hip flexion at groin.  No pain with PROM of hip  Assessment/Plan: Patient stable  Based on MRI there may be some mass effect from hematoma on the psoas which correlates with his exam.  Will have IR take a look and see about perc draining this.  From a  Nerve compression standpoint this MRI looks similar to his preop at the L4/L5 level and there is no indication for acute intervention there at this time.  2. Chronic pain management/pallative care consult requested for improved overall pain management options - OOB with brace ad lib  - will dc lovenox and continue SCDs due to concern over hematoma  Nicholes Stairs, MD [facility] [phone]

## 2018-06-19 NOTE — Progress Notes (Signed)
I received a phone call in regards to a fax from [facility]. MRI results were faxed.  I was told the physician had requested results.  Paper copy has been placed in the chart for review by physician.

## 2018-06-20 LAB — CREATININE, SERUM
Creatinine, Ser: 0.81 mg/dL (ref 0.61–1.24)
GFR calc Af Amer: >60 mL/min (ref >60)
GFR calc non Af Amer: >60 mL/min (ref >60)

## 2018-06-20 NOTE — Progress Notes (Signed)
Pt has had more pain in his back throughout the night than last night. I have tried frequent repositioning and pain medication.

## 2018-06-20 NOTE — Progress Notes (Signed)
Patient ID: Brian [name], male   DOB: June 23, 1946, 72 y.o.   MRN: [id number] Subjective: 7 Days Post-Op Procedure(s) (LRB): POSTERIOR SPINAL FUSION L3-L5 (N/A)    Patient reports pain as a bit more tolerable currently.  Pretty down with all that has gone on recently  Objective:   VITALS:   Vitals:   06/19/18 2300 06/20/18 0400  BP: 136/78 (!) 86/62  Pulse:    Resp:    Temp: 98.8 F (37.1 C) 97.6 F (36.4 C)  SpO2: 98%     Neurovascular intact  LABS Recent Labs    06/19/18 0448  HGB 7.7*  HCT 23.3*  WBC 13.5*  PLT 358    No results for input(s): NA, K, BUN, CREATININE, GLUCOSE in the last 72 hours.  No results for input(s): LABPT, INR in the last 72 hours.   Assessment/Plan: 7 Days Post-Op Procedure(s) (LRB): POSTERIOR SPINAL FUSION L3-L5 (N/A)  As per outlined yesterday I will check with Dr. Rolena Infante to make sure that he is aware and if anything else needs to be done otherwise I anticipate he will be back to see him tomorrow

## 2018-06-21 LAB — HEMOGLOBIN AND HEMATOCRIT, BLOOD
HCT: 21.8 % — ABNORMAL LOW (ref 39.0–52.0)
HEMATOCRIT: 28.2 % — AB (ref 39.0–52.0)
Hemoglobin: 7 g/dL — ABNORMAL LOW (ref 13.0–17.0)
Hemoglobin: 9.2 g/dL — ABNORMAL LOW (ref 13.0–17.0)

## 2018-06-21 LAB — PREPARE RBC (CROSSMATCH)

## 2018-06-21 MED ORDER — SODIUM CHLORIDE 0.9% IV SOLUTION
Freq: Once | INTRAVENOUS | Status: AC
  Administered 2018-06-21: 11:00:00 via INTRAVENOUS

## 2018-06-21 NOTE — Progress Notes (Addendum)
MD office paged to notify of Radiology consult notes and if diet can be replaced. [place] called, orders placed.

## 2018-06-21 NOTE — Progress Notes (Signed)
Looks like [name] is having a tough time.  He is making slow progress with respect to physical therapy.  It sounds like he may need to have another surgery.  According to Dr. Charmayne Sheer note today, it looks like he might move along with a posterior Gill decompression to try to help with the right foraminal stenosis and central stenosis.  [name] got 2 units of blood today.  He had visitors in the room.  I talked to his wife via cell phone.  I again told [name] that he has stage IV lung cancer.  I again explained to him that he had squamous cell cancer.  I told him that this is not curable but at least treatable.  I will have to call radiation oncology so they can get involved now.  He will need radiation to the left clavicle.  I will think he is able to have radiation to his back yet, particularly if he is going to have more surgery.  I am still awaiting the molecular analysis that was sent off on his cancer.  His appetite still is not that great.  I encouraged his family to bring in what ever food that they could try to get him to eat better.  He did have iron deficiency.  He is got a dose of iron.  He probably would benefit from another dose of iron.  We will order this later on in the week.  Again, I just wish [name] would make more progress.  Maybe he just is not able to if he has other spinal issues that need to be addressed.  I think that it might be something to think about him getting palliative care.  I think that they might be able to help [place] out with respect to pain management issues which I know he is going to have.  I know that he is getting fantastic care from all the staff up on 4 NP!!  Lattie Haw, MD  Michaelyn Barter 4:6

## 2018-06-21 NOTE — Progress Notes (Signed)
Nutrition Follow-up  DOCUMENTATION CODES:   Not applicable  INTERVENTION:  Continue Ensure Enlive po BID, each supplement provides 350 kcal and 20 grams of protein.  Encourage adequate PO intake.   NUTRITION DIAGNOSIS:   Increased nutrient needs related to cancer and cancer related treatments as evidenced by estimated needs; ongoing  GOAL:   Patient will meet greater than or equal to 90% of their needs; progressing  MONITOR:   PO intake, Supplement acceptance  REASON FOR ASSESSMENT:   Rounds    ASSESSMENT:   Pt with PMH of OA, chronic back pain, OA, COPD and RA who was admitted to [facility] on 12/13 with back pain. Pt found to have presumed stage IV lung cancer due to lumbar spine metastatic lesion s/p fusion.   Diet advanced back to regular diet as pt did not undergo drainage of R groin hematoma today. Per IR note, no fluid component to access for drain placement. Pt reports having a good appetite. No recent meal completion recorded in flow sheets. Pt currently has Ensure ordered and reports favoring them. RD to continue with current orders to aid in caloric and protein needs. Labs and medications reviewed.   Diet Order:   Diet Order            Diet regular Room service appropriate? Yes; Fluid consistency: Thin  Diet effective now              EDUCATION NEEDS:   Education needs have been addressed  Skin:  Skin Assessment: Reviewed RN Assessment  Last BM:  12/28  Height:   Ht Readings from Last 1 Encounters:  06/09/18 5\' 10"  (1.778 m)    Weight:   Wt Readings from Last 1 Encounters:  06/13/18 93.8 kg    Ideal Body Weight:  75.4 kg  BMI:  Body mass index is 29.67 kg/m.  Estimated Nutritional Needs:   Kcal:  2000-2300  Protein:  105-124 grams  Fluid:  > 2 L/day    Corrin Parker, MS, RD, LDN Pager # [phone] After hours/ weekend pager # [phone]

## 2018-06-21 NOTE — Progress Notes (Addendum)
    Subjective: Procedure(s) (LRB): POSTERIOR SPINAL FUSION L3-L5 (N/A) 8 Days Post-Op  Patient reports pain as 4 on 0-10 scale.  Reports decreased leg pain reports incisional back pain   Positive void Positive bowel movement Positive flatus Negative chest pain or shortness of breath  Objective: Vital signs in last 24 hours: Temp:  [98.1 F (36.7 C)-99.4 F (37.4 C)] 98.8 F (37.1 C) (12/30 0448) Pulse Rate:  [97-116] 112 (12/30 0448) Resp:  [18-22] 18 (12/30 0448) BP: (98-120)/(61-71) 99/68 (12/30 0448) SpO2:  [90 %-95 %] 90 % (12/30 0448)  Intake/Output from previous day: 12/29 0701 - 12/30 0700 In: 740.3 [I.V.:740.3] Out: 475 [Urine:475]  Labs: Recent Labs    06/19/18 0448  WBC 13.5*  RBC 2.48*  HCT 23.3*  PLT 358   Recent Labs    06/20/18 0726  CREATININE 0.81   No results for input(s): LABPT, INR in the last 72 hours.  Physical Exam: ABD soft Intact pulses distally Dorsiflexion/Plantar flexion intact Incision: dressing C/D/I and no drainage Compartment soft Neuro: bilateral hip/quad weakness (3/5).  EHL/TA/GA: 5/5 bilaterally.  Decreased sensation to light touch bilaterally over the thigh Body mass index is 29.67 kg/m.   Assessment/Plan: Patient stable  MRI: no change in spinal stenosis at L4/5 due to tumor mass.  No significant left foraminal stenosis.  Right formainal stenosis unchanged.  Large right psoas hematoma is noted. Hematoma not unexpected given right segmental bleed at time of corpectomy.  D/C'ed lovenox as I believe acute increase in sizer of hematoma would have explained sudden increase in right lateral abdominal and groin pain.  This pain has improved substantially from Friday of last week. With respect to LE weakness - this can be combination of lumbar plexus irritation from the corpectomy as well as residual stenosis.  Given failure to progress I will plan on moving forward with posterior gill decompression this week.This will address  the right foraminal stenosis as well as central stenosis. Await input from interventional radiology about percutaneous drainage of right psoas hematoma. Hold lovenox - continue mechanical DVT prevention. Continue mobilization with physical therapy HCT decreased to 23.3.  Unable to tolerate oral iron.  Will transfuse 2 units today. Continue care    Melina Schools, MD [facility] [phone]

## 2018-06-21 NOTE — Progress Notes (Signed)
PT Cancellation Note  Patient Details Name: Brian [name] MRN: [id number] DOB: 1946/06/19   Cancelled Treatment:    Reason Eval/Treat Not Completed: Patient not medically ready. Pt going to IR today for drainage of R groin hematoma and is receiving blood. Pt very upset about his condition and is having difficulty understanding the procedures and is medical condition. Pt cont to be in severe pain as well. Acute PT to return as able, as appropriate to progress mobility.  Kittie Plater, PT, DPT Acute Rehabilitation Services Pager #: [phone] Office #: [phone]    Berline Lopes 06/21/2018, 8:54 AM

## 2018-06-21 NOTE — Progress Notes (Signed)
Hem/Onc MD in room with pt and family and this RN walked into room to participate; MD and RN left room and continued conversation of patient care including pain management, plan of care, and possibility of getting Palliative on board to help patient and family understand/cope with patient prognosis.  From RN observations, patient in denial about diagnosis other than "I am going to die" though he will not verbalize that he has cancer also not fully understanding plan of care.

## 2018-06-21 NOTE — Progress Notes (Signed)
Physical Therapy Treatment Patient Details Name: Brian [name] MRN: [id number] DOB: 27-Jun-1945 Today's Date: 06/21/2018    History of Present Illness 72 yo s/p L3-5 PLIF corpectomy metastic with lesion with primary stage III lung CA commanded by acute respiratory failure intubated from 12/20-12/23  PMH: emphysema, pathologic L4 fx, lung CA with mets to the bone, RA, abdominal aortic aneurysm    PT Comments    Pt con't to have back and hip pain however tolerate standing much better today and was able to participate despite low BP and report "I feel like i'm going to pass out." Bp at 96/54, patient with plans to received blood later today. Pt was able to stand x2 with stedy with min/modAx2. Unsure of overall prognosis but would recommend palliative care consult to address pain and cancer diagnosis as patient in denial. From mobility stand point cont to recommend CIR for maximal functional recovery. Acute PT to cont to follow.    Follow Up Recommendations  CIR;Supervision/Assistance - 24 hour     Equipment Recommendations  None recommended by PT    Recommendations for Other Services Rehab consult     Precautions / Restrictions Precautions Precautions: Back Precaution Booklet Issued: Yes (comment) Precaution Comments: educated pt and spouse Required Braces or Orthoses: Spinal Brace Spinal Brace: Thoracolumbosacral orthotic;Applied in sitting position Restrictions Weight Bearing Restrictions: No    Mobility  Bed Mobility Overal bed mobility: Needs Assistance Bed Mobility: Rolling;Sidelying to Sit;Sit to Supine Rolling: Mod assist Sidelying to sit: Mod assist;+2 for physical assistance     Sit to sidelying: Mod assist;+2 for physical assistance General bed mobility comments: v/c for log roll technique, modA to complete sidelying and modA for both trunk elevation and LE management on/off bed. pt did push up with UEs  Transfers Overall transfer level: Needs  assistance Equipment used: (steady) Transfers: Sit to/from Stand Sit to Stand: Min assist;+2 physical assistance         General transfer comment: pt much improved today despite pain and report of "I feel like i'm going to pass out" Pt used bilat UEs to pull up on stedy and demo'd increased abiltiy to push up with LEs to the point of borderline impulsivity. pt completed second stand from stedy, pt standing tolerance limited by pain in LEs and feeling of "passing out"  Ambulation/Gait             General Gait Details: unable at this time   Stairs             Wheelchair Mobility    Modified Rankin (Stroke Patients Only)       Balance Overall balance assessment: Needs assistance Sitting-balance support: Feet supported;Bilateral upper extremity supported Sitting balance-Leahy Scale: Fair Sitting balance - Comments: able to sit with bilat UE support without additional assist   Standing balance support: Bilateral upper extremity supported Standing balance-Leahy Scale: Poor Standing balance comment: dependent on physical assist                            Cognition Arousal/Alertness: Awake/alert Behavior During Therapy: WFL for tasks assessed/performed Overall Cognitive Status: Within Functional Limits for tasks assessed                                 General Comments: pt however very much in deniel regarding that he has cancer      Exercises  General Comments General comments (skin integrity, edema, etc.): pt pale and BP 96/54, aware pt to receive blood transfusion and has also just received dilaudid      Pertinent Vitals/Pain Pain Assessment: 0-10 Pain Score: 6  Pain Location: low back and hips Pain Descriptors / Indicators: Aching;Burning Pain Intervention(s): Monitored during session    Home Living                      Prior Function            PT Goals (current goals can now be found in the care plan  section) Acute Rehab PT Goals Patient Stated Goal: stop the pain Progress towards PT goals: Progressing toward goals    Frequency    Min 5X/week      PT Plan Current plan remains appropriate    Co-evaluation              AM-PAC PT "6 Clicks" Mobility   Outcome Measure  Help needed turning from your back to your side while in a flat bed without using bedrails?: A Lot Help needed moving from lying on your back to sitting on the side of a flat bed without using bedrails?: A Lot Help needed moving to and from a bed to a chair (including a wheelchair)?: A Lot Help needed standing up from a chair using your arms (e.g., wheelchair or bedside chair)?: A Lot Help needed to walk in hospital room?: Total Help needed climbing 3-5 steps with a railing? : Total 6 Click Score: 10    End of Session   Activity Tolerance: Patient tolerated treatment well Patient left: in bed;with call bell/phone within reach;with family/visitor present;with nursing/sitter in room;with bed alarm set Nurse Communication: Mobility status PT Visit Diagnosis: Unsteadiness on feet (R26.81);Difficulty in walking, not elsewhere classified (R26.2);Pain Pain - Right/Left: Right Pain - part of body: Leg     Time: 1050-1117 PT Time Calculation (min) (ACUTE ONLY): 27 min  Charges:  $Therapeutic Activity: 23-37 mins                     Kittie Plater, PT, DPT Acute Rehabilitation Services Pager #: [phone] Office #: [phone]    Berline Lopes 06/21/2018, 1:59 PM

## 2018-06-21 NOTE — Progress Notes (Signed)
OT Cancellation Note  Patient Details Name: Adorian Gwynne MRN: [id number] DOB: 1946-05-14   Cancelled Treatment:    Reason Eval/Treat Not Completed: Patient not medically ready. Per PT patient going to IR today for drainage of R groin hematoma and is receiving blood. Acute OT to return as appropriate and able.    Delight Stare, OT Acute Rehabilitation Services Pager [phone] Office [phone]    Delight Stare 06/21/2018, 10:07 AM

## 2018-06-21 NOTE — Progress Notes (Signed)
Patient ID: Brian [name], male   DOB: 1946/02/27, 72 y.o.   MRN: [id number]   Request made for Rt psoas hematoma drain placement  Dr Laurence Ferrari has reviewed imaging  No fluid component to access for drain placement  No procedure per Dr Laurence Ferrari  Dr Rolena Infante aware

## 2018-06-22 DIAGNOSIS — G893 Neoplasm related pain (acute) (chronic): Secondary | ICD-10-CM

## 2018-06-22 DIAGNOSIS — C3491 Malignant neoplasm of unspecified part of right bronchus or lung: Secondary | ICD-10-CM

## 2018-06-22 DIAGNOSIS — Z515 Encounter for palliative care: Secondary | ICD-10-CM

## 2018-06-22 LAB — BPAM RBC
Blood Product Expiration Date: 202001282359
Blood Product Expiration Date: 202001282359
ISSUE DATE / TIME: 201912301107
ISSUE DATE / TIME: 201912301323
Unit Type and Rh: 5100
Unit Type and Rh: 5100

## 2018-06-22 LAB — TYPE AND SCREEN
ABO/RH(D): O NEG
Antibody Screen: NEGATIVE
Unit division: 0
Unit division: 0

## 2018-06-22 MED ORDER — FENTANYL 12 MCG/HR TD PT72
12.5000 ug | MEDICATED_PATCH | TRANSDERMAL | Status: DC
  Administered 2018-06-22: 12.5 ug via TRANSDERMAL
  Filled 2018-06-22: qty 1

## 2018-06-22 MED ORDER — ACETAMINOPHEN 160 MG/5ML PO SOLN
650.0000 mg | Freq: Three times a day (TID) | ORAL | Status: DC
  Administered 2018-06-22 – 2018-06-23 (×5): 650 mg via ORAL
  Filled 2018-06-22 (×5): qty 20.3

## 2018-06-22 MED ORDER — SENNA 8.6 MG PO TABS
1.0000 | ORAL_TABLET | Freq: Every day | ORAL | Status: DC
  Administered 2018-06-23: 8.6 mg via ORAL
  Filled 2018-06-22: qty 1

## 2018-06-22 NOTE — Progress Notes (Signed)
Occupational Therapy Treatment Patient Details Name: Brian [name] MRN: [id number] DOB: May 25, 1946 Today's Date: 06/22/2018    History of present illness 72 yo s/p L3-5 PLIF corpectomy metastic with lesion with primary stage III lung CA commanded by acute respiratory failure intubated from 12/20-12/23  PMH: emphysema, pathologic L4 fx, lung CA with mets to the bone, RA, abdominal aortic aneurysm   OT comments  Patient seated in recliner upon entry.  Eager to participate in OT.  Education initated on LB AE for bathing/dressing, able to return demonstrate use of reacher, sock aide with minimal cueing to maintain upright posture and prevent forward lean.  Patient fatigues easily and is limited by pain.  Able to recall 2/3 precautions without cueing.  Declined transfers at this time, as patient wanted to sit up for the entire hour before returning back to bed. Will continue to follow.    Follow Up Recommendations  CIR    Equipment Recommendations  3 in 1 bedside commode;Other (comment)    Recommendations for Other Services Rehab consult    Precautions / Restrictions Precautions Precautions: Back Precaution Booklet Issued: Yes (comment) Precaution Comments: educated pt and spouse, patient able to recall 2/3 without cueing Required Braces or Orthoses: Spinal Brace Spinal Brace: Thoracolumbosacral orthotic Restrictions Weight Bearing Restrictions: No       Mobility Bed Mobility Overal bed mobility: Needs Assistance Bed Mobility: Rolling;Sidelying to Sit;Sit to Supine Rolling: Mod assist Sidelying to sit: Mod assist;+2 for physical assistance       General bed mobility comments: seated OOB in recliner upon entry   Transfers Overall transfer level: Needs assistance Equipment used: (2 person lift with bed pad up into stedy) Transfers: Sit to/from Stand Sit to Stand: +2 physical assistance;Mod assist Stand pivot transfers: (used steady)       General transfer comment:  deferred, pt declined     Balance Overall balance assessment: Needs assistance Sitting-balance support: Feet supported;Bilateral upper extremity supported Sitting balance-Leahy Scale: Fair Sitting balance - Comments: able to tolerate sitting EOB without UE support x 15 sec. sitting balance improving   Standing balance support: Bilateral upper extremity supported Standing balance-Leahy Scale: Poor Standing balance comment: dependent on UEs and stedy                           ADL either performed or assessed with clinical judgement   ADL Overall ADL's : Needs assistance/impaired                     Lower Body Dressing: Maximal assistance;Sitting/lateral leans;+2 for physical assistance Lower Body Dressing Details (indicate cue type and reason): initated educated on AE, demosntrating ability to don/doff B socks using reacher/sock aide with supervision, min cueing for precautions   Toilet Transfer Details (indicate cue type and reason): pt deferred at this time                  Vision       Perception     Praxis      Cognition Arousal/Alertness: Awake/alert Behavior During Therapy: WFL for tasks assessed/performed Overall Cognitive Status: Within Functional Limits for tasks assessed                                 General Comments: patient with depressed spirits, frustrated throughout session        Exercises General Exercises - Lower Extremity  Hip Flexion/Marching: AROM;Both;5 reps;Standing   Shoulder Instructions       General Comments spouse present and supportive    Pertinent Vitals/ Pain       Pain Assessment: 0-10 Pain Score: 4  Pain Location: low back and hips Pain Descriptors / Indicators: Aching;Burning Pain Intervention(s): Monitored during session  Home Living                                          Prior Functioning/Environment              Frequency  Min 3X/week        Progress  Toward Goals  OT Goals(current goals can now be found in the care plan section)  Progress towards OT goals: Progressing toward goals  Acute Rehab OT Goals Patient Stated Goal: stop the pain OT Goal Formulation: With patient/family Time For Goal Achievement: 06/28/18 Potential to Achieve Goals: Good ADL Goals Pt Will Perform Lower Body Dressing: with max assist;with adaptive equipment;sit to/from stand;sitting/lateral leans  Plan Discharge plan remains appropriate;Frequency remains appropriate    Co-evaluation                 AM-PAC OT "6 Clicks" Daily Activity     Outcome Measure   Help from another person eating meals?: None Help from another person taking care of personal grooming?: None(seated) Help from another person toileting, which includes using toliet, bedpan, or urinal?: Total Help from another person bathing (including washing, rinsing, drying)?: A Lot Help from another person to put on and taking off regular upper body clothing?: A Little Help from another person to put on and taking off regular lower body clothing?: A Lot 6 Click Score: 16    End of Session Equipment Utilized During Treatment: Back brace  OT Visit Diagnosis: Unsteadiness on feet (R26.81);Pain;Muscle weakness (generalized) (M62.81) Pain - Right/Left: Right Pain - part of body: Hip   Activity Tolerance Patient tolerated treatment well   Patient Left in chair;with call bell/phone within reach;with chair alarm set;with family/visitor present   Nurse Communication Mobility status;Precautions;Need for lift equipment        Time: 3570-1779 OT Time Calculation (min): 19 min  Charges: OT General Charges $OT Visit: 1 Visit OT Treatments $Self Care/Home Management : 8-22 mins  Delight Stare, [place] Pager [phone] Office [phone]    Delight Stare 06/22/2018, 12:37 PM

## 2018-06-22 NOTE — Progress Notes (Signed)
Patient family is concerned about the red rash to the right flank, please pass on to Physician. Wife states that she believes "it has gotten bigger" and daughter stated "his wife forgot to ask Dr. Rolena Infante to look at it this morning".   Patient does have relief from fentanyl patch that was placed. Refused oral dilaudid @ 1830.

## 2018-06-22 NOTE — Progress Notes (Signed)
Occupational Therapy Treatment Patient Details Name: Brian [name] MRN: [id number] DOB: [birth year] Today's Date: 06/22/2018    History of present illness 72 yo s/p L3-5 PLIF corpectomy metastic with lesion with primary stage III lung CA commanded by acute respiratory failure intubated from 12/20-12/23  PMH: emphysema, pathologic L4 fx, lung CA with mets to the bone, RA, abdominal aortic aneurysm   OT comments  Returned to see patient to assist nurse tech with transfer to bed.  Patient incontinent of bowel seated in recliner, used stedy to attempt sit to stand for hygiene with max assist +2 but patient unable to achieve full stand or maintain squat to ensure clean with 6+ trial and max rest breaks.  Used maxi sky to return to bed with +3 assist for safety, as patient pain and fatigue increasing.  Once supine able to roll with mod assist in order to complete toileting hygiene.  Nurse tech in room upon therapist exit.  DC plan remains appropriate.  Pt highly motivated and will benefit from intensive CIR level. Will continue to follow.    Follow Up Recommendations  CIR    Equipment Recommendations  3 in 1 bedside commode;Other (comment)    Recommendations for Other Services Rehab consult    Precautions / Restrictions Precautions Precautions: Back Precaution Booklet Issued: Yes (comment) Precaution Comments: educated pt and spouse, patient able to recall 2/3 without cueing Required Braces or Orthoses: Spinal Brace Spinal Brace: Thoracolumbosacral orthotic Restrictions Weight Bearing Restrictions: No       Mobility Bed Mobility Overal bed mobility: Needs Assistance Bed Mobility: Rolling Rolling: Mod assist Sidelying to sit: Mod assist;+2 for physical assistance       General bed mobility comments: returned to supine using maxi sky, rolling with bed with mod assist to complete hygiene after incontient BM  Transfers Overall transfer level: Needs assistance Equipment used:  (2 person lift with bed pad up into stedy) Transfers: Sit to/from Stand Sit to Stand: +2 physical assistance;Mod assist Stand pivot transfers: (used steady)       General transfer comment: attempted sit to stand x 6 trials from recliner using sara stedy, patient with poor hip clearance given max assist +2 and requires increased rest breaks; pain increasing, fatigue increasing, therapist with +3 assist decided to use maxi sky to safely transfer patient back to bed     Balance Overall balance assessment: Needs assistance Sitting-balance support: Feet supported;Bilateral upper extremity supported Sitting balance-Leahy Scale: Fair Sitting balance - Comments: able to tolerate sitting EOB without UE support x 15 sec. sitting balance improving   Standing balance support: Bilateral upper extremity supported Standing balance-Leahy Scale: Poor Standing balance comment: dependent on UEs and stedy                           ADL either performed or assessed with clinical judgement   ADL Overall ADL's : Needs assistance/impaired                     Lower Body Dressing: Maximal assistance;Sitting/lateral leans;+2 for physical assistance Lower Body Dressing Details (indicate cue type and reason): initated educated on AE, demosntrating ability to don/doff B socks using reacher/sock aide with supervision, min cueing for precautions Toilet Transfer: Total assistance;+2 for physical assistance Toilet Transfer Details (indicate cue type and reason): using maxi sky simulated to bed  Toileting- Clothing Manipulation and Hygiene: Total assistance;+2 for physical assistance Toileting - Clothing Manipulation Details (indicate cue type  and reason): +2 assist to squat in stedy in order to attempt cleaning after incontinent BM       General ADL Comments: attempted stedy for transfer, pt fatigue and unable to raise hips off chair more than 20 degrees given max assist +2      Vision        Perception     Praxis      Cognition Arousal/Alertness: Awake/alert Behavior During Therapy: WFL for tasks assessed/performed Overall Cognitive Status: Within Functional Limits for tasks assessed                                 General Comments: patient with depressed spirits, frustrated throughout session        Exercises General Exercises - Lower Extremity Hip Flexion/Marching: AROM;Both;5 reps;Standing   Shoulder Instructions       General Comments spouse present and supportive    Pertinent Vitals/ Pain       Pain Assessment: 0-10 Pain Score: 6  Pain Location: low back and hips Pain Descriptors / Indicators: Aching;Burning Pain Intervention(s): Monitored during session;Patient requesting pain meds-RN notified;Repositioned  Home Living                                          Prior Functioning/Environment              Frequency  Min 3X/week        Progress Toward Goals  OT Goals(current goals can now be found in the care plan section)  Progress towards OT goals: Progressing toward goals  Acute Rehab OT Goals Patient Stated Goal: stop the pain OT Goal Formulation: With patient/family Time For Goal Achievement: 06/28/18 Potential to Achieve Goals: Good ADL Goals Pt Will Perform Lower Body Dressing: with max assist;with adaptive equipment;sit to/from stand;sitting/lateral leans  Plan Discharge plan remains appropriate;Frequency remains appropriate    Co-evaluation                 AM-PAC OT "6 Clicks" Daily Activity     Outcome Measure   Help from another person eating meals?: None Help from another person taking care of personal grooming?: None(seated) Help from another person toileting, which includes using toliet, bedpan, or urinal?: Total Help from another person bathing (including washing, rinsing, drying)?: A Lot Help from another person to put on and taking off regular upper body clothing?: A  Little Help from another person to put on and taking off regular lower body clothing?: A Lot 6 Click Score: 16    End of Session Equipment Utilized During Treatment: Back brace  OT Visit Diagnosis: Unsteadiness on feet (R26.81);Pain;Muscle weakness (generalized) (M62.81) Pain - Right/Left: Right Pain - part of body: Hip(back)   Activity Tolerance Patient tolerated treatment well   Patient Left in bed;with call bell/phone within reach;with nursing/sitter in room;with family/visitor present   Nurse Communication Mobility status;Precautions;Need for lift equipment        Time: 7564-3329 OT Time Calculation (min): 46 min  Charges: OT General Charges $OT Visit: 1 Visit OT Treatments $Self Care/Home Management : 8-22 mins $Therapeutic Activity: 23-37 mins  Delight Stare, OT Acute Rehabilitation Services Pager [phone] Office [phone].    Delight Stare 06/22/2018, 12:44 PM

## 2018-06-22 NOTE — Progress Notes (Signed)
Spoke with Dr. Rolena Infante this morning after patient assessment and palliative consult has been put in to better manage pain and to educate patient and family about oncology diagnosis.   Nurse had an extensive conversation with patient and wife (at bedside) about working with physical therapy despite being scheduled for another surgery on Thursday; opening lungs, preventing pneumonia, preventing blood clots, and trying to prevent further decline.

## 2018-06-22 NOTE — Progress Notes (Signed)
    Subjective: Procedure(s) (LRB): POSTERIOR SPINAL FUSION L3-L5 (N/A) 9 Days Post-Op  Patient reports pain as 6 on 0-10 scale.  Reports unchanged leg pain reports incisional back pain   Positive void Negative bowel movement Positive flatus Negative chest pain or shortness of breath  Objective: Vital signs in last 24 hours: Temp:  [97.9 F (36.6 C)-99.8 F (37.7 C)] 99.1 F (37.3 C) (12/31 0300) Pulse Rate:  [69-113] 69 (12/30 1601) Resp:  [16-18] 18 (12/30 1601) BP: (92-120)/(63-97) 119/73 (12/31 0300) SpO2:  [90 %-98 %] 98 % (12/30 2300)  Intake/Output from previous day: 12/30 0701 - 12/31 0700 In: 2379.9 [I.V.:1718.3; Blood:661.7] Out: 750 [Urine:750]  Labs: Recent Labs    06/21/18 0759 06/21/18 1920  HCT 21.8* 28.2*   Recent Labs    06/20/18 0726  CREATININE 0.81   No results for input(s): LABPT, INR in the last 72 hours.  Physical Exam: ABD soft Intact pulses distally Incision: dressing C/D/I No cellulitis present Compartment soft no change in neuro exam Body mass index is 29.67 kg/m.   Assessment/Plan: Patient stable  Unable to drain hematoma  HCT improved s/p transfusion Agree with IV iron supplementation Given ongoing neuropathic pain will plan on moving forward with formal posterior decompression.  Will get CT scan today or tomorrow to evaluate hardware. Patient had better day yesterday with PT - with improved HCT and stable vitals should be able to do more today.   Spoke with patient and his wife and explained surgical plan as well as risks and benefits.  All questions addressed. Continue mobilization with physical therapy Continue care    Melina Schools, MD [facility] [phone]

## 2018-06-22 NOTE — Progress Notes (Signed)
Physical Therapy Treatment Patient Details Name: Brian [name] MRN: [id number] DOB: [birth year] Today's Date: 06/22/2018    History of Present Illness 72 yo s/p L3-5 PLIF corpectomy metastic with lesion with primary stage III lung CA commanded by acute respiratory failure intubated from 12/20-12/23  PMH: emphysema, pathologic L4 fx, lung CA with mets to the bone, RA, abdominal aortic aneurysm    PT Comments    Pt con't to progress towards goals. Pt able to tolerate 3 standing sessions in stedy with min/modAx2 and complete marching in steady x 8 reps, x4 reps (2 trials). Pt c/o this session is needing to have a bowel movement. Spoke with RN staff and encouraged to use the stedy to transfer pt in to bathroom to use commode. Pt continuing to do LE HEP with family. Aware pt is to go back to surgery on Thursday but cont to encourage OOB mobility and LE there ex to gain strength before second surgery. Acute PT to cont to follow.   Follow Up Recommendations  CIR;Supervision/Assistance - 24 hour     Equipment Recommendations  None recommended by PT    Recommendations for Other Services Rehab consult     Precautions / Restrictions Precautions Precautions: Back Precaution Booklet Issued: Yes (comment) Precaution Comments: educated pt and spouse Required Braces or Orthoses: Spinal Brace Spinal Brace: Thoracolumbosacral orthotic;Applied in sitting position Restrictions Weight Bearing Restrictions: No    Mobility  Bed Mobility Overal bed mobility: Needs Assistance Bed Mobility: Rolling;Sidelying to Sit;Sit to Supine Rolling: Mod assist Sidelying to sit: Mod assist;+2 for physical assistance       General bed mobility comments: modA with logroll to the R and trunk elevation  Transfers Overall transfer level: Needs assistance Equipment used: (2 person lift with bed pad up into stedy) Transfers: Sit to/from Stand Sit to Stand: +2 physical assistance;Mod assist Stand pivot  transfers: (used steady)       General transfer comment: pt completed 3 sit to stands, pt able to achieve 2 full upright positions and maintain for 10 sec prior to onset of increased pain.   Ambulation/Gait             General Gait Details: pt marched x 8 steps x 1 trial and 4 steps x1 trial in stedy.    Stairs             Wheelchair Mobility    Modified Rankin (Stroke Patients Only)       Balance Overall balance assessment: Needs assistance Sitting-balance support: Feet supported;Bilateral upper extremity supported Sitting balance-Leahy Scale: Fair Sitting balance - Comments: able to tolerate sitting EOB without UE support x 15 sec. sitting balance improving   Standing balance support: Bilateral upper extremity supported Standing balance-Leahy Scale: Poor Standing balance comment: dependent on UEs and stedy                            Cognition Arousal/Alertness: Awake/alert Behavior During Therapy: WFL for tasks assessed/performed Overall Cognitive Status: Within Functional Limits for tasks assessed                                 General Comments: pt with depressed spirits but did smile and laugh during session      Exercises General Exercises - Lower Extremity Hip Flexion/Marching: AROM;Both;5 reps;Standing    General Comments General comments (skin integrity, edema, etc.): pt much improved this  date, BP stable      Pertinent Vitals/Pain Pain Assessment: 0-10 Pain Score: 3  Pain Location: low back and hips Pain Descriptors / Indicators: Aching;Burning Pain Intervention(s): Monitored during session    Home Living                      Prior Function            PT Goals (current goals can now be found in the care plan section) Progress towards PT goals: Progressing toward goals    Frequency    Min 5X/week      PT Plan Current plan remains appropriate    Co-evaluation              AM-PAC PT  "6 Clicks" Mobility   Outcome Measure  Help needed turning from your back to your side while in a flat bed without using bedrails?: A Little Help needed moving from lying on your back to sitting on the side of a flat bed without using bedrails?: A Lot Help needed moving to and from a bed to a chair (including a wheelchair)?: A Lot Help needed standing up from a chair using your arms (e.g., wheelchair or bedside chair)?: A Lot Help needed to walk in hospital room?: Total Help needed climbing 3-5 steps with a railing? : Total 6 Click Score: 11    End of Session Equipment Utilized During Treatment: Gait belt;Oxygen Activity Tolerance: Patient tolerated treatment well Patient left: in chair;with call bell/phone within reach;with chair alarm set;with family/visitor present Nurse Communication: Mobility status PT Visit Diagnosis: Difficulty in walking, not elsewhere classified (R26.2);Pain Pain - part of body: (back)     Time: 8938-1017 PT Time Calculation (min) (ACUTE ONLY): 37 min  Charges:  $Therapeutic Exercise: 8-22 mins $Therapeutic Activity: 8-22 mins                     Kittie Plater, PT, DPT Acute Rehabilitation Services Pager #: [phone] Office #: [phone]    Berline Lopes 06/22/2018, 10:21 AM

## 2018-06-22 NOTE — Consult Note (Signed)
Consultation Note Date: 06/22/2018   Patient Name: Brian [name]  DOB: 12-08-1945  MRN: [id number]  Age / Sex: 72 y.o., male  PCP: [name], MD Referring Physician: Melina Schools, MD  Reason for Consultation: Establishing goals of care  HPI/Patient Profile: 72 y.o. male  with past medical history of rheumatoid arthritis admitted on 06/04/2018 with low back pain, radicular pain into right buttocks and leg, and rib/left shoulder pain. CT myelogram ordered outpatient and revealed L4 pathologic compression fracture with lytic destruction and extraosseous tumor encroaching into spinal canal and L4-L5 intervertebral foramina. Seen in outpatient ortho office and decision made for direct admission for further workup. 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 Patient s/p LV corpectomy on 12/20 and L3-L5 spinal fusion on 12/22. Pending posterior decompression surgery this week. Oncology following. Interventions are not curable but palliative in nature. Radiation and ? Other oncology options pending clinical condition. Palliative medicine consultation for goals of care/pain management.    Clinical Assessment and Goals of Care:  I have reviewed medical records, discussed with RN, and met with patient and wife [name]) at bedside to discuss diagnosis, interventions, plan of care, and symptoms.   I introduced Palliative Medicine as specialized medical care for people living with serious illness. It focuses on providing relief from the symptoms and stress of a serious illness. The goal is to improve quality of life for both the patient and the family.  We discussed a brief life review of the patient. Prior to hospitalization, patient living home and active/independent. Besides rheumatoid arthritis, patient speaks of no other health concerns. He retired from [employer]. He  has one daughter (previous relationship). Cindy and him have 11 cats.   Discussed events leading up to hospitalization and course of hospitalization including diagnoses and interventions. Patient worked with therapy today and was able to sit in the chair. He is frustrated with himself that he required lift to get back into the bed. We discussed plan for surgical procedure on Thursday of this week.   Extensively discussed symptoms with patient and his wife ( who has kept track of details since admission).   Gregary complains of 6/10 pain in his lower back and down right hip/thigh and calf. He also complains of pain that radiates down the left leg. He experiences relief from prn dilaudid (pain down to 2 or 3) and has been trying to take prn every 3 hours instead of 2 as ordered. He is able to sleep at night after pain medication is given. Cannen had a BM this afternoon. He is not complaining of current nausea. Cough is relieved by scheduled robitussin.   Discussed starting extended release medication in order to better manage his ongoing pain with hopes of enhancing quality of life and ability to function/work with therapy.   [name] shares his experience after OxyContin was given this admission (while at [facility]). Malikah was severely nauseated and vomited intermittently for 1.5 days after this first dose was given.  The OxyContin was discontinued after that episode. Nausea/vomiting was controlled by prn promethazine.   We discussed starting low dose fentanyl patch for extended release pain as well as continue prn dilaudid. Patient/wife willing to start TD fentanyl in hopes of having better pain management. Discussed if he becomes nauseous/vomiting after patch applied, we can consider discontinuing patch and also RN can give prn promethazine. Patient/wife agreeable with this plan.   Wife shares that at home, Jermall was taking daily Miralax and dulcolax. (Currently receiving scheduled Miralax but  has not taken in 3 days). Patient/wife agreeable with continuing scheduled Miralax and initiating laxative in order to stay "proactive" with bowel movements.   Patient also feels robaxin helps manage pain.   Questions and concerns were addressed. PMT contact information given to wife. Emotional support provided. Patient/wife still trying to grasp significant life change in the last month. Reassured patient and wife of ongoing support from palliative team throughout hospitalization.    SUMMARY OF RECOMMENDATIONS    Good initial conversation with patient and wife at bedside.   Extensively discussed symptoms and medications initiated. See below.   PMT will continue to support patient/family through hospitalization. Will need future discussions regarding goals of care with stage IV cancer and high symptom burden. Oncology following.   Code Status/Advance Care Planning:  Full code  Symptom Management:   Fentanyl 12.73mg TD q72h  Scheduled Tylenol 6542mTID x3 days  Continue prn liquid dilaudid. IV dilaudid on MAR for breakthrough pain.   Continue prn robaxin  Miralax daily  Senna 1tab daily  Continue scheduled guaifenesin  Continue promethazine prn  Palliative Prophylaxis:   Bowel Regimen and Frequent Pain Assessment  Additional Recommendations (Limitations, Scope, Preferences):  Full Scope Treatment  Psycho-social/Spiritual:   Desire for further Chaplaincy support: yes  Additional Recommendations: Caregiving  Support/Resources  Prognosis:   Unable to determine  Discharge Planning: To Be Determined: Possibly CIR     Primary Diagnoses: Present on Admission: . Lumbar spine tumor . Bronchitis due to tobacco use . Lung cancer, primary, with metastasis from lung to other site, right ([facility]. Squamous cell lung cancer, right ([facility]. [facility] ([facility]  I have reviewed the medical record, interviewed the patient and family, and examined the  patient. The following aspects are pertinent.  Past Medical History:  Diagnosis Date  . Arthritis    Social History   Socioeconomic History  . Marital status: Married    Spouse name: Not on file  . Number of children: Not on file  . Years of education: Not on file  . Highest education level: Not on file  Occupational History  . Not on file  Social Needs  . Financial resource strain: Not on file  . Food insecurity:    Worry: Not on file    Inability: Not on file  . Transportation needs:    Medical: Not on file    Non-medical: Not on file  Tobacco Use  . Smoking status: Current Every Day Smoker    Packs/day: 0.25    Types: Cigarettes  . Smokeless tobacco: Never Used  Substance and Sexual Activity  . Alcohol use: Not on file  . Drug use: Never  . Sexual activity: Not on file  Lifestyle  . Physical activity:    Days per week: Not on file    Minutes per session: Not on file  . Stress: Not on file  Relationships  . Social connections:    Talks on phone:  Not on file    Gets together: Not on file    Attends religious service: Not on file    Active member of club or organization: Not on file    Attends meetings of clubs or organizations: Not on file    Relationship status: Not on file  Other Topics Concern  . Not on file  Social History Narrative  . Not on file   Family History  Problem Relation Age of Onset  . Diabetes Mother   . Heart attack Father   . Obesity Father   . Diabetes Sister    Scheduled Meds: . acetaminophen (TYLENOL) oral liquid 160 mg/5 mL  650 mg Oral TID  . famotidine  20 mg Oral BID  . feeding supplement (ENSURE ENLIVE)  237 mL Oral BID BM  . fentaNYL  12.5 mcg Transdermal Q72H  . folic acid  2 mg Oral Daily  . guaiFENesin  10 mL Oral Q6H  . mouth rinse  15 mL Mouth Rinse BID  . polyethylene glycol  17 g Oral Daily  . senna  1 tablet Oral Daily   Continuous Infusions: . lactated ringers 85 mL/hr at 06/22/18 0505  . methocarbamol  (ROBAXIN) IV 500 mg (06/17/18 1259)  . ondansetron (ZOFRAN) IV    . sodium chloride     PRN Meds:.albuterol, bisacodyl, HYDROmorphone (DILAUDID) injection, HYDROmorphone HCl, menthol-cetylpyridinium **OR** phenol, [DISCONTINUED] methocarbamol **OR** methocarbamol (ROBAXIN) IV, methocarbamol, [DISCONTINUED] ondansetron **OR** ondansetron (ZOFRAN) IV, promethazine Medications Prior to Admission:  Prior to Admission medications   Medication Sig Start Date End Date Taking? Authorizing Provider  aspirin 81 MG chewable tablet Chew 81 mg by mouth daily.   Yes [provider]  Calcium Carb-Cholecalciferol (CALCIUM 600/VITAMIN D3) 600-800 MG-UNIT TABS Take 1 tablet by mouth daily.   Yes [provider]  docusate sodium (COLACE) 100 MG capsule Take 100 mg by mouth 2 (two) times daily as needed for mild constipation.   Yes [provider]  folic acid (FOLVITE) 1 MG tablet Take 1 mg by mouth daily.   Yes [provider]  Homeopathic Products (T-RELIEF PAIN RELIEF) CREA Apply 1 application topically 2 (two) times daily. Hip/Back pain   Yes [provider]  HYDROmorphone (DILAUDID) 4 MG tablet Take 4 mg by mouth every 6 (six) hours as needed for severe pain.   Yes [provider]  hydroxypropyl methylcellulose / hypromellose (ISOPTO TEARS / GONIOVISC) 2.5 % ophthalmic solution Place 1-2 drops into both eyes as needed for dry eyes.   Yes [provider]  ibuprofen (ADVIL,MOTRIN) 200 MG tablet Take 400 mg by mouth every 6 (six) hours as needed for headache or mild pain.   Yes [provider]  Lysine 500 MG CAPS Take 500 mg by mouth daily as needed (chapped lips).   Yes [provider]  Omega-3 350 MG CPDR Take 350 mg by mouth daily.   Yes [provider]  Potassium 99 MG TABS Take 99 mg by mouth daily as needed (leg cramps).   Yes [provider]  pseudoephedrine (SUDAFED) 120 MG 12 hr tablet Take 120 mg by mouth  every 12 (twelve) hours as needed for congestion.   Yes [provider]   Allergies  Allergen Reactions  . Minoxidil Palpitations  . Nicotine Palpitations  . Sulfamethoxazole Other (See Comments)    Both parents allergic, avoids med   Review of Systems  Constitutional: Positive for activity change and appetite change.       Radiating  back pain  Neurological: Positive for weakness.   Physical Exam Vitals signs and nursing note reviewed.  Constitutional:      General: He is awake.     Appearance: He is ill-appearing.  HENT:     Head: Normocephalic and atraumatic.  Cardiovascular:     Rate and Rhythm: Normal rate.  Pulmonary:     Effort: No tachypnea, accessory muscle usage or respiratory distress.  Abdominal:     Tenderness: There is no abdominal tenderness.  Skin:    General: Skin is warm and dry.     Coloration: Skin is pale.  Neurological:     Mental Status: He is alert and oriented to person, place, and time.  Psychiatric:        Attention and Perception: Attention normal.        Mood and Affect: Mood normal.        Speech: Speech normal.        Behavior: Behavior normal.        Cognition and Memory: Cognition normal.     Vital Signs: BP 109/70 (BP Location: Left Arm)   Pulse (!) 105   Temp 97.9 F (36.6 C) (Oral)   Resp 18   Ht _0  (1.778 m)   Wt 93.8 kg   SpO2 92%   BMI 29.67 kg/m  Pain Scale: 0-10 POSS *See Group Information*: S-Acceptable,Sleep, easy to arouse Pain Score: Asleep   SpO2: SpO2: 92 % O2 Device:SpO2: 92 % O2 Flow Rate: .O2 Flow Rate (L/min): 2 L/min  IO: Intake/output summary:   Intake/Output Summary (Last 24 hours) at 06/22/2018 1557 Last data filed at 06/22/2018 1539 Gross per 24 hour  Intake 1654.05 ml  Output 500 ml  Net 1154.05 ml    LBM: Last BM Date: 06/19/18 Baseline Weight: Weight: 89.9 kg Most recent weight: Weight: 93.8 kg     Palliative Assessment/Data: PPS 40%   Flowsheet Rows     Most Recent  Value  Intake Tab  Referral Department  -- [Ortho/oncology]  Unit at Time of Referral  Intermediate Care Unit  Palliative Care Primary Diagnosis  Cancer  Date Notified  06/22/18  Palliative Care Type  New Palliative care  Reason for referral  Clarify Goals of Care, Pain  Date of Admission  06/04/18  Date first seen by Palliative Care  06/22/18  # of days IP prior to Palliative referral  18  Clinical Assessment  Palliative Performance Scale Score  40%  Psychosocial & Spiritual Assessment  Palliative Care Outcomes  Patient/Family meeting held?  Yes  Who was at the meeting?  patient and wife  Palliative Care Outcomes  Improved pain interventions, Improved non-pain symptom therapy, Provided psychosocial or spiritual support      Time In: 1400 Time Out: 1515 Time Total: 83mn Greater than 50%  of this time was spent counseling and coordinating care related to the above assessment and plan.  Signed by:  MIhor Dow DNP, FNP-C Palliative Medicine Team  Phone: [phone]: [phone]  Please contact Palliative Medicine Team phone at [phone] questions and concerns.  For individual provider: See AShea Evans

## 2018-06-22 NOTE — Progress Notes (Signed)
Inpatient Rehabilitation-Admissions Coordinator   Met with pt and his family at the bedside. We will follow for tolerance with therapies after surgery to help determine most appropriate post acute care venue.   Jhonnie Garner, OTR/L  Rehab Admissions Coordinator  [phone] 06/22/2018 5:52 PM

## 2018-06-22 NOTE — Care Management Note (Signed)
Case Management Note  Patient Details  Name: Quinn Quam MRN: [id number] Date of Birth: 1945-06-25  Subjective/Objective: 72 yo s/p L3-5 PLIF corpectomy metastic with lesion with primary stage III lung CA commanded by acute respiratory failure intubated from 12/20-12/23.  PTA, pt needs assistance with ADLS; lives with spouse.                   Action/Plan: Pt greatly limited by pain; planning further surgery on Thursday.  Palliative Car consult today for goals of care and symptom management.  Hopeful for CIR once pt able to tolerate required therapy for rehab unit.    Expected Discharge Date:  (unknown)               Expected Discharge Plan:  [place]  In-House Referral:     Discharge planning Services  CM Consult  Post Acute Care Choice:    Choice offered to:     DME Arranged:    DME Agency:     HH Arranged:    HH Agency:     Status of Service:  In process, will continue to follow  If discussed at Long Length of Stay Meetings, dates discussed:    Additional Comments:  Reinaldo Raddle, RN, BSN  Trauma/Neuro ICU Case Manager [phone]

## 2018-06-22 NOTE — Progress Notes (Signed)
NP for Palliative Care came to speak with family, new orders for sennokot, fentanyl patch @c  12.5 mg every 72 hours, and scheduled tylenol.   Family is visiting.

## 2018-06-23 ENCOUNTER — Inpatient Hospital Stay (HOSPITAL_COMMUNITY): Payer: Medicare Other

## 2018-06-23 NOTE — Progress Notes (Signed)
Brian [name]  MRN: [id number] DOB/Age: 74-Apr-1947 74 y.o. [facility] Procedure: Procedure(s) (LRB): POSTERIOR SPINAL FUSION L3-L5 (N/A)     Subjective: Biggest concern today is for an evolving rash noted yesterday. Was noticed prior to any medication changes and addition of fentanyl yesterday. Patient states it is not pruitic.   CT scan performed earlier this am  Vital Signs Temp:  [97.6 F (36.4 C)-99.2 F (37.3 C)] 97.9 F (36.6 C) (01/01 0732) Pulse Rate:  [96-111] 96 (01/01 0732) Resp:  [16] 16 (01/01 0300) BP: (109-127)/(67-76) 125/76 (01/01 0732) SpO2:  [90 %-100 %] 90 % (01/01 0732)  Lab Results Recent Labs    06/21/18 0759 06/21/18 1920  HGB 7.0* 9.2*  HCT 21.8* 28.2*   BMET No results for input(s): NA, K, CL, CO2, GLUCOSE, BUN, CREATININE, CALCIUM in the last 72 hours. INR  Date Value Ref Range Status  06/14/2018 1.12  Final    Comment:    Performed at [facility], [place] [address], [place], [place] [address]     Exam Patient awake and alert, in usual level of pain but able to mobilize enough to log roll  Patient with erythematous dense rash, blanches with pressure. No lesions identified. It is greatest in areas of back and flank where he is dependent. Not fluctuant. It does incorporate his prior surgical incisions but no drainage or sign of infectious process evident  He has a smaller area of redness noted overlying his mid left clavicle    CT scan this am:  1. Large right and small left retroperitoneal hematomas expanding the psoas muscles. Comparison to the MRI of 06/18/2018 is limited given differences in field of view, but sizes are approximately the same. 2. No acute fracture or static subluxation of the lumbar spine.        Plan Both myself and Dr. Onnie Graham along with palliative care evaluated this new rash. I question whether it is most likely a result of the resolving blood products from his retro peritoneal  hematomas creating this redness in dependent areas along with heat rash. It does not look like a typical drug rash.  His current plan is for surgery tomorrow at 11:00 Continue current treatment  Jenetta Loges PA-C  06/23/2018, 10:40 AM Contact # [phone]

## 2018-06-23 NOTE — Care Management Important Message (Signed)
Important Message  Patient Details  Name: Brian [name] MRN: [id number] Date of Birth: 11/09/1945   Medicare Important Message Given:  Yes    [name] [place] 06/23/2018, 12:43 PM

## 2018-06-23 NOTE — Progress Notes (Signed)
Daily Progress Note   Patient Name: Brian [name]       Date: 06/23/2018 DOB: January 02, 1946  Age: 73 y.o. MRN#: [id number] Attending Physician: Melina Schools, MD Primary Care Physician: [name], MD Admit Date: 06/04/2018  Reason for Consultation/Follow-up: Establishing goals of care  Subjective: Patient awake, alert, oriented. Ortho PA and MD at bedside evaluating back/flank rash.   Patient and wife [name]) already feel that addition of low-dose fentanyl patch has better managed his pain. He is requiring prn dilaudid every 4-5 hours compared to about every 3 hours. Patient currently comfortable with limited pain. He was able to sleep last night until woken up for CT scan.   Discussed plan for surgery by Dr. Rolena Infante in AM, scheduled for 11am.   Wife has PMT contact information and encouraged her to call with questions or concerns. Reassured of ongoing palliative f/u for assistance with symptom management.    Length of Stay: 19  Current Medications: Scheduled Meds:  . acetaminophen (TYLENOL) oral liquid 160 mg/5 mL  650 mg Oral TID  . famotidine  20 mg Oral BID  . feeding supplement (ENSURE ENLIVE)  237 mL Oral BID BM  . fentaNYL  12.5 mcg Transdermal Q72H  . folic acid  2 mg Oral Daily  . guaiFENesin  10 mL Oral Q6H  . mouth rinse  15 mL Mouth Rinse BID  . polyethylene glycol  17 g Oral Daily  . senna  1 tablet Oral Daily    Continuous Infusions: . lactated ringers 85 mL/hr at 06/22/18 0505  . methocarbamol (ROBAXIN) IV 500 mg (06/22/18 2226)  . ondansetron (ZOFRAN) IV    . sodium chloride      PRN Meds: albuterol, bisacodyl, HYDROmorphone (DILAUDID) injection, HYDROmorphone HCl, menthol-cetylpyridinium **OR** phenol, [DISCONTINUED] methocarbamol **OR** methocarbamol  (ROBAXIN) IV, methocarbamol, [DISCONTINUED] ondansetron **OR** ondansetron (ZOFRAN) IV, promethazine  Physical Exam Vitals signs and nursing note reviewed.  Constitutional:      General: He is awake.  HENT:     Head: Normocephalic and atraumatic.  Cardiovascular:     Rate and Rhythm: Normal rate.  Pulmonary:     Effort: No tachypnea, accessory muscle usage or respiratory distress.  Abdominal:     Tenderness: There is no abdominal tenderness.  Skin:    General: Skin is warm and dry.  Comments: Erythematous, blanchable, non-pruritic rash mid-lower back and flank. Ortho at bedside evaluating.   Neurological:     Mental Status: He is alert and oriented to person, place, and time.  Psychiatric:        Mood and Affect: Mood normal.        Speech: Speech normal.        Behavior: Behavior normal.        Cognition and Memory: Cognition normal.            Vital Signs: BP 125/76 (BP Location: Left Arm)   Pulse 96   Temp 97.9 F (36.6 C) (Oral)   Resp 16   Ht 5\' 10"  (1.778 m)   Wt 93.8 kg   SpO2 90%   BMI 29.67 kg/m  SpO2: SpO2: 90 % O2 Device: O2 Device: Room Air O2 Flow Rate: O2 Flow Rate (L/min): 2 L/min  Intake/output summary:   Intake/Output Summary (Last 24 hours) at 06/23/2018 1050 Last data filed at 06/23/2018 2423 Gross per 24 hour  Intake 1339.05 ml  Output 200 ml  Net 1139.05 ml   LBM: Last BM Date: 06/22/18 Baseline Weight: Weight: 89.9 kg Most recent weight: Weight: 93.8 kg       Palliative Assessment/Data: PPS 40%   Flowsheet Rows     Most Recent Value  Intake Tab  Referral Department  -- [Ortho/oncology]  Unit at Time of Referral  Intermediate Care Unit  Palliative Care Primary Diagnosis  Cancer  Date Notified  06/22/18  Palliative Care Type  New Palliative care  Reason for referral  Clarify Goals of Care, Pain  Date of Admission  06/04/18  Date first seen by Palliative Care  06/22/18  # of days IP prior to Palliative referral  18  Clinical  Assessment  Palliative Performance Scale Score  40%  Psychosocial & Spiritual Assessment  Palliative Care Outcomes  Patient/Family meeting held?  Yes  Who was at the meeting?  patient and wife  Palliative Care Outcomes  Improved pain interventions, Improved non-pain symptom therapy, Provided psychosocial or spiritual support      Patient Active Problem List   Diagnosis Date Noted  . Palliative care by specialist   . Cancer related pain   . Lung cancer, primary, with metastasis from lung to other site, right ([place]) 06/14/2018  . Squamous cell lung cancer, right ([place]) 06/14/2018  . Lung cancer metastatic to bone ([place]) 06/14/2018  . Bronchitis due to tobacco use 06/07/2018  . Lumbar spine tumor 06/04/2018    Palliative Care Assessment & Plan   Patient Profile: 73 y.o. male  with past medical history of rheumatoid arthritis admitted on 06/04/2018 with low back pain, radicular pain into right buttocks and leg, and rib/left shoulder pain. CT myelogram ordered outpatient and revealed L4 pathologic compression fracture with lytic destruction and extraosseous tumor encroaching into spinal canal and L4-L5 intervertebral foramina. Seen in outpatient ortho office and decision made for direct admission for further workup. 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 Patient s/p LV corpectomy on 12/20 and L3-L5 spinal fusion on 12/22. Pending posterior decompression surgery this week. Oncology following. Interventions are not curable but palliative in nature. Radiation and ? Other oncology options pending clinical condition. Palliative medicine consultation for goals of care/pain management.    Assessment: Metastatic lung cancer to liver and bone L4 compression fracture s/p spinal fusion Cancer related pain Deconditioning Hx of rheumatoid arthritis  Recommendations/Plan:  Patient and wife feel  improvement in pain control with initiation of fentanyl  patch. Requiring less frequent doses of prn dilaudid. Continue current symptom management regimen.   Patient scheduled for posterior decompression per ortho tomorrow at 11am  PMT will continue to follow and assist with symptom management. Patient will need future discussions regarding goals of care with stage IV lung cancer. Pending clinical course. Oncology following.   Goals of Care and Additional Recommendations:  Limitations on Scope of Treatment: Full Scope Treatment  Code Status: FULL   Code Status Orders  (From admission, onward)         Start     Ordered   06/11/18 2030  Full code  Continuous     06/11/18 2030        Code Status History    Date Active Date Inactive Code Status Order ID Comments User Context   06/11/2018 2011 06/11/2018 2030 Full Code [id number]  Melina Schools, MD Inpatient   06/04/2018 1344 06/11/2018 2011 Full Code [id number]  Ardeen Jourdain, PA-C Inpatient       Prognosis:   Unable to determine  Discharge Planning:  To Be Determined  Care plan was discussed with patient, wife, ortho team  Thank you for allowing the Palliative Medicine Team to assist in the care of this patient.   Time In: 1015 Time Out: 1045 Total Time 30 Prolonged Time Billed no      Greater than 50%  of this time was spent counseling and coordinating care related to the above assessment and plan.  Ihor Dow, FNP-C Palliative Medicine Team  Phone: [phone] Fax: [phone]  Please contact Palliative Medicine Team phone at [phone] for questions and concerns.

## 2018-06-23 NOTE — Plan of Care (Signed)
  Problem: Education: Goal: Knowledge of General Education information will improve Description Including pain rating scale, medication(s)/side effects and non-pharmacologic comfort measures Outcome: Progressing   Problem: Health Behavior/Discharge Planning: Goal: Ability to manage health-related needs will improve Outcome: Progressing   Problem: Clinical Measurements: Goal: Ability to maintain clinical measurements within normal limits will improve Outcome: Progressing Goal: Will remain free from infection Outcome: Progressing Goal: Diagnostic test results will improve Outcome: Progressing Goal: Respiratory complications will improve Outcome: Progressing Goal: Cardiovascular complication will be avoided Outcome: Progressing   Problem: Activity: Goal: Risk for activity intolerance will decrease Outcome: Progressing   Problem: Nutrition: Goal: Adequate nutrition will be maintained Outcome: Progressing   Problem: Coping: Goal: Level of anxiety will decrease Outcome: Not Progressing   Problem: Elimination: Goal: Will not experience complications related to bowel motility Outcome: Progressing Goal: Will not experience complications related to urinary retention Outcome: Progressing   Problem: Pain Managment: Goal: General experience of comfort will improve Outcome: Progressing   Problem: Safety: Goal: Ability to remain free from injury will improve Outcome: Progressing   Problem: Education: Goal: Knowledge of General Education information will improve Description Including pain rating scale, medication(s)/side effects and non-pharmacologic comfort measures Outcome: Progressing   Problem: Skin Integrity: Goal: Risk for impaired skin integrity will decrease Outcome: Progressing

## 2018-06-24 ENCOUNTER — Ambulatory Visit
Admit: 2018-06-24 | Discharge: 2018-06-24 | Disposition: A | Discharge status: Home / Self Care | Payer: Medicare Other | Source: Ambulatory Visit | Attending: Radiation Oncology | Admitting: Radiation Oncology

## 2018-06-24 ENCOUNTER — Inpatient Hospital Stay (HOSPITAL_COMMUNITY): Payer: Medicare Other | Admitting: Anesthesiology

## 2018-06-24 ENCOUNTER — Encounter (HOSPITAL_COMMUNITY): Payer: Self-pay | Admitting: Anesthesiology

## 2018-06-24 ENCOUNTER — Inpatient Hospital Stay (HOSPITAL_COMMUNITY): Payer: Medicare Other

## 2018-06-24 ENCOUNTER — Ambulatory Visit (HOSPITAL_COMMUNITY)
Admission: AD | Disposition: A | Discharge status: Rehabilitation Facility | Payer: Self-pay | Source: Ambulatory Visit | Attending: Orthopedic Surgery

## 2018-06-24 DIAGNOSIS — C3490 Malignant neoplasm of unspecified part of unspecified bronchus or lung: Secondary | ICD-10-CM

## 2018-06-24 DIAGNOSIS — C7951 Secondary malignant neoplasm of bone: Secondary | ICD-10-CM | POA: Insufficient documentation

## 2018-06-24 DIAGNOSIS — F17210 Nicotine dependence, cigarettes, uncomplicated: Secondary | ICD-10-CM | POA: Insufficient documentation

## 2018-06-24 DIAGNOSIS — Z981 Arthrodesis status: Secondary | ICD-10-CM

## 2018-06-24 DIAGNOSIS — C787 Secondary malignant neoplasm of liver and intrahepatic bile duct: Secondary | ICD-10-CM | POA: Insufficient documentation

## 2018-06-24 HISTORY — PX: DECOMPRESSIVE LUMBAR LAMINECTOMY LEVEL 1: SHX5791

## 2018-06-24 SURGERY — DECOMPRESSIVE LUMBAR LAMINECTOMY LEVEL 1
Anesthesia: General

## 2018-06-24 MED ORDER — HEMOSTATIC AGENTS (NO CHARGE) OPTIME
TOPICAL | Status: DC | PRN
  Administered 2018-06-24: 1 via TOPICAL

## 2018-06-24 MED ORDER — PHENOL 1.4 % MT LIQD: 1.0000 | OROMUCOSAL | Status: DC | PRN

## 2018-06-24 MED ORDER — PROPOFOL 10 MG/ML IV BOLUS
INTRAVENOUS | Status: AC
  Filled 2018-06-24: qty 20

## 2018-06-24 MED ORDER — OXYCODONE HCL ER 15 MG PO T12A: 15.0000 mg | EXTENDED_RELEASE_TABLET | Freq: Two times a day (BID) | ORAL | Status: DC

## 2018-06-24 MED ORDER — IPRATROPIUM-ALBUTEROL 0.5-2.5 (3) MG/3ML IN SOLN
3.0000 mL | Freq: Four times a day (QID) | RESPIRATORY_TRACT | Status: DC
  Administered 2018-06-24: 3 mL via RESPIRATORY_TRACT
  Filled 2018-06-24 (×2): qty 3

## 2018-06-24 MED ORDER — ONDANSETRON HCL 4 MG/2ML IJ SOLN: 4.0000 mg | Freq: Four times a day (QID) | INTRAMUSCULAR | Status: DC | PRN

## 2018-06-24 MED ORDER — LACTATED RINGERS IV SOLN
INTRAVENOUS | Status: DC
  Administered 2018-06-24 – 2018-06-25 (×3): via INTRAVENOUS

## 2018-06-24 MED ORDER — ONDANSETRON HCL 4 MG PO TABS: 4.0000 mg | ORAL_TABLET | Freq: Four times a day (QID) | ORAL | Status: DC | PRN

## 2018-06-24 MED ORDER — ONDANSETRON HCL 4 MG/2ML IJ SOLN
INTRAMUSCULAR | Status: DC | PRN
  Administered 2018-06-24: 4 mg via INTRAVENOUS

## 2018-06-24 MED ORDER — GABAPENTIN 300 MG PO CAPS
300.0000 mg | ORAL_CAPSULE | Freq: Three times a day (TID) | ORAL | Status: DC
  Administered 2018-06-24 – 2018-06-29 (×15): 300 mg via ORAL
  Filled 2018-06-24 (×15): qty 1

## 2018-06-24 MED ORDER — THROMBIN (RECOMBINANT) 20000 UNITS EX SOLR
CUTANEOUS | Status: AC
  Filled 2018-06-24: qty 20000

## 2018-06-24 MED ORDER — ONDANSETRON HCL 4 MG/2ML IJ SOLN: 4.0000 mg | Freq: Once | INTRAMUSCULAR | Status: DC | PRN

## 2018-06-24 MED ORDER — FENTANYL 12 MCG/HR TD PT72
12.5000 ug | MEDICATED_PATCH | TRANSDERMAL | Status: DC
  Administered 2018-06-24 – 2018-06-27 (×2): 12.5 ug via TRANSDERMAL
  Filled 2018-06-24 (×2): qty 1

## 2018-06-24 MED ORDER — HYDROMORPHONE HCL 1 MG/ML PO LIQD: 1.0000 mg | ORAL | Status: DC | PRN

## 2018-06-24 MED ORDER — DEXAMETHASONE SODIUM PHOSPHATE 10 MG/ML IJ SOLN
INTRAMUSCULAR | Status: AC
  Filled 2018-06-24: qty 1

## 2018-06-24 MED ORDER — SENNA 8.6 MG PO TABS
1.0000 | ORAL_TABLET | Freq: Every day | ORAL | Status: DC
  Administered 2018-06-25 – 2018-06-29 (×3): 8.6 mg via ORAL
  Filled 2018-06-24 (×3): qty 1

## 2018-06-24 MED ORDER — THROMBIN 20000 UNITS EX SOLR
CUTANEOUS | Status: DC | PRN
  Administered 2018-06-24: 20000 [IU] via TOPICAL

## 2018-06-24 MED ORDER — DEXAMETHASONE SODIUM PHOSPHATE 4 MG/ML IJ SOLN
INTRAMUSCULAR | Status: DC | PRN
  Administered 2018-06-24: 10 mg via INTRAVENOUS

## 2018-06-24 MED ORDER — FENTANYL CITRATE (PF) 100 MCG/2ML IJ SOLN
INTRAMUSCULAR | Status: DC | PRN
  Administered 2018-06-24 (×2): 50 ug via INTRAVENOUS
  Administered 2018-06-24: 100 ug via INTRAVENOUS

## 2018-06-24 MED ORDER — HYDROMORPHONE HCL 1 MG/ML PO LIQD
1.0000 mg | ORAL | Status: DC | PRN
  Administered 2018-06-29 (×2): 1 mg via ORAL
  Filled 2018-06-24 (×2): qty 1

## 2018-06-24 MED ORDER — LIDOCAINE 2% (20 MG/ML) 5 ML SYRINGE
INTRAMUSCULAR | Status: AC
  Filled 2018-06-24: qty 5

## 2018-06-24 MED ORDER — ACETAMINOPHEN 650 MG RE SUPP: 650.0000 mg | RECTAL | Status: DC | PRN

## 2018-06-24 MED ORDER — ROCURONIUM BROMIDE 50 MG/5ML IV SOSY
PREFILLED_SYRINGE | INTRAVENOUS | Status: AC
  Filled 2018-06-24: qty 5

## 2018-06-24 MED ORDER — LACTATED RINGERS IV SOLN
INTRAVENOUS | Status: DC
  Administered 2018-06-24: 10:00:00 via INTRAVENOUS

## 2018-06-24 MED ORDER — PANTOPRAZOLE SODIUM 40 MG IV SOLR
40.0000 mg | Freq: Every day | INTRAVENOUS | Status: DC
  Administered 2018-06-24 – 2018-06-27 (×4): 40 mg via INTRAVENOUS
  Filled 2018-06-24 (×4): qty 40

## 2018-06-24 MED ORDER — VANCOMYCIN HCL IN DEXTROSE 1-5 GM/200ML-% IV SOLN: 1000.0000 mg | Freq: Two times a day (BID) | INTRAVENOUS | Status: DC

## 2018-06-24 MED ORDER — SODIUM CHLORIDE 0.9% FLUSH
3.0000 mL | Freq: Two times a day (BID) | INTRAVENOUS | Status: DC
  Administered 2018-06-24 – 2018-06-29 (×11): 3 mL via INTRAVENOUS

## 2018-06-24 MED ORDER — CALAMINE EX LOTN
1.0000 "application " | TOPICAL_LOTION | Freq: Two times a day (BID) | CUTANEOUS | Status: DC
  Administered 2018-06-24 – 2018-06-27 (×5): 1 via TOPICAL
  Filled 2018-06-24: qty 177

## 2018-06-24 MED ORDER — SODIUM CHLORIDE 0.9 % IV SOLN
250.0000 mL | INTRAVENOUS | Status: DC
  Administered 2018-06-24: 250 mL via INTRAVENOUS

## 2018-06-24 MED ORDER — VANCOMYCIN HCL IN DEXTROSE 1-5 GM/200ML-% IV SOLN
1000.0000 mg | INTRAVENOUS | Status: AC
  Administered 2018-06-24: 1000 mg via INTRAVENOUS
  Filled 2018-06-24: qty 200

## 2018-06-24 MED ORDER — ROCURONIUM BROMIDE 10 MG/ML (PF) SYRINGE
PREFILLED_SYRINGE | INTRAVENOUS | Status: DC | PRN
  Administered 2018-06-24: 50 mg via INTRAVENOUS

## 2018-06-24 MED ORDER — LIDOCAINE 2% (20 MG/ML) 5 ML SYRINGE
INTRAMUSCULAR | Status: DC | PRN
  Administered 2018-06-24: 60 mg via INTRAVENOUS

## 2018-06-24 MED ORDER — PROPOFOL 10 MG/ML IV BOLUS
INTRAVENOUS | Status: DC | PRN
  Administered 2018-06-24: 150 mg via INTRAVENOUS

## 2018-06-24 MED ORDER — FLEET ENEMA 7-19 GM/118ML RE ENEM
1.0000 | ENEMA | Freq: Once | RECTAL | Status: DC | PRN
  Filled 2018-06-24: qty 1

## 2018-06-24 MED ORDER — VANCOMYCIN HCL 10 G IV SOLR
1250.0000 mg | Freq: Once | INTRAVENOUS | Status: AC
  Administered 2018-06-24: 1250 mg via INTRAVENOUS
  Filled 2018-06-24: qty 1250

## 2018-06-24 MED ORDER — POLYETHYLENE GLYCOL 3350 17 G PO PACK: 17.0000 g | PACK | Freq: Every day | ORAL | Status: DC | PRN

## 2018-06-24 MED ORDER — SODIUM CHLORIDE 0.9% FLUSH: 3.0000 mL | INTRAVENOUS | Status: DC | PRN

## 2018-06-24 MED ORDER — SODIUM CHLORIDE 0.9 % IV SOLN
INTRAVENOUS | Status: DC | PRN
  Administered 2018-06-24: 50 ug/min via INTRAVENOUS

## 2018-06-24 MED ORDER — FENTANYL CITRATE (PF) 100 MCG/2ML IJ SOLN: 25.0000 ug | INTRAMUSCULAR | Status: DC | PRN

## 2018-06-24 MED ORDER — 0.9 % SODIUM CHLORIDE (POUR BTL) OPTIME
TOPICAL | Status: DC | PRN
  Administered 2018-06-24: 1000 mL

## 2018-06-24 MED ORDER — METHOCARBAMOL 500 MG PO TABS
500.0000 mg | ORAL_TABLET | Freq: Four times a day (QID) | ORAL | Status: DC | PRN
  Administered 2018-06-25 – 2018-06-29 (×14): 500 mg via ORAL
  Filled 2018-06-24 (×14): qty 1

## 2018-06-24 MED ORDER — BUPIVACAINE-EPINEPHRINE (PF) 0.25% -1:200000 IJ SOLN
INTRAMUSCULAR | Status: AC
  Filled 2018-06-24: qty 30

## 2018-06-24 MED ORDER — IPRATROPIUM-ALBUTEROL 0.5-2.5 (3) MG/3ML IN SOLN
3.0000 mL | Freq: Three times a day (TID) | RESPIRATORY_TRACT | Status: DC
  Administered 2018-06-25 – 2018-06-26 (×4): 3 mL via RESPIRATORY_TRACT
  Filled 2018-06-24 (×4): qty 3

## 2018-06-24 MED ORDER — ACETAMINOPHEN 325 MG PO TABS
650.0000 mg | ORAL_TABLET | ORAL | Status: DC | PRN
  Administered 2018-06-24 – 2018-06-29 (×9): 650 mg via ORAL
  Filled 2018-06-24 (×9): qty 2

## 2018-06-24 MED ORDER — METHOCARBAMOL 1000 MG/10ML IJ SOLN
500.0000 mg | Freq: Four times a day (QID) | INTRAVENOUS | Status: DC | PRN
  Administered 2018-06-25 (×2): 500 mg via INTRAVENOUS
  Filled 2018-06-24 (×3): qty 5

## 2018-06-24 MED ORDER — BUPIVACAINE-EPINEPHRINE (PF) 0.25% -1:200000 IJ SOLN
INTRAMUSCULAR | Status: DC | PRN
  Administered 2018-06-24: 8 mL

## 2018-06-24 MED ORDER — FENTANYL CITRATE (PF) 250 MCG/5ML IJ SOLN
INTRAMUSCULAR | Status: AC
  Filled 2018-06-24: qty 5

## 2018-06-24 MED ORDER — POLYETHYLENE GLYCOL 3350 17 G PO PACK
17.0000 g | PACK | Freq: Every day | ORAL | Status: DC
  Administered 2018-06-25 – 2018-06-29 (×3): 17 g via ORAL
  Filled 2018-06-24 (×3): qty 1

## 2018-06-24 MED ORDER — ONDANSETRON HCL 4 MG/2ML IJ SOLN
INTRAMUSCULAR | Status: AC
  Filled 2018-06-24: qty 2

## 2018-06-24 MED ORDER — MENTHOL 3 MG MT LOZG: 1.0000 | LOZENGE | OROMUCOSAL | Status: DC | PRN

## 2018-06-24 SURGICAL SUPPLY — 55 items
BNDG GAUZE ELAST 4 BULKY (GAUZE/BANDAGES/DRESSINGS) ×2 IMPLANT
CLSR STERI-STRIP ANTIMIC 1/2X4 (GAUZE/BANDAGES/DRESSINGS) ×1 IMPLANT
CONT SPECI 4OZ STER CLIK (MISCELLANEOUS) ×1 IMPLANT
CORDS BIPOLAR (ELECTRODE) ×2 IMPLANT
COVER SURGICAL LIGHT HANDLE (MISCELLANEOUS) ×2 IMPLANT
COVER WAND RF STERILE (DRAPES) ×2 IMPLANT
DRAPE POUCH INSTRU U-SHP 10X18 (DRAPES) ×2 IMPLANT
DRAPE SURG 17X11 SM STRL (DRAPES) ×2 IMPLANT
DRAPE U-SHAPE 47X51 STRL (DRAPES) ×2 IMPLANT
DRSG AQUACEL AG ADV 3.5X 4 (GAUZE/BANDAGES/DRESSINGS) ×2 IMPLANT
DRSG MEPILEX BORDER 4X8 (GAUZE/BANDAGES/DRESSINGS) ×1 IMPLANT
DRSG OPSITE POSTOP 4X6 (GAUZE/BANDAGES/DRESSINGS) ×1 IMPLANT
DURAPREP 26ML APPLICATOR (WOUND CARE) ×2 IMPLANT
ELECT BLADE 4.0 EZ CLEAN MEGAD (MISCELLANEOUS) ×2
ELECT PENCIL ROCKER SW 15FT (MISCELLANEOUS) ×2 IMPLANT
ELECT REM PT RETURN 9FT ADLT (ELECTROSURGICAL) ×2
ELECTRODE BLDE 4.0 EZ CLN MEGD (MISCELLANEOUS) ×1 IMPLANT
ELECTRODE REM PT RTRN 9FT ADLT (ELECTROSURGICAL) ×1 IMPLANT
GLOVE BIO SURGEON STRL SZ 6.5 (GLOVE) ×2 IMPLANT
GLOVE BIOGEL PI IND STRL 6.5 (GLOVE) ×1 IMPLANT
GLOVE BIOGEL PI IND STRL 8.5 (GLOVE) ×1 IMPLANT
GLOVE BIOGEL PI INDICATOR 6.5 (GLOVE) ×1
GLOVE BIOGEL PI INDICATOR 8.5 (GLOVE) ×1
GLOVE SS BIOGEL STRL SZ 8.5 (GLOVE) ×1 IMPLANT
GLOVE SUPERSENSE BIOGEL SZ 8.5 (GLOVE) ×1
GOWN STRL REUS W/ TWL LRG LVL3 (GOWN DISPOSABLE) ×1 IMPLANT
GOWN STRL REUS W/TWL 2XL LVL3 (GOWN DISPOSABLE) ×4 IMPLANT
GOWN STRL REUS W/TWL LRG LVL3 (GOWN DISPOSABLE) ×1
KIT BASIN OR (CUSTOM PROCEDURE TRAY) ×2 IMPLANT
NDL SPNL 18GX3.5 QUINCKE PK (NEEDLE) ×2 IMPLANT
NEEDLE 22X1 1/2 (OR ONLY) (NEEDLE) ×2 IMPLANT
NEEDLE SPNL 18GX3.5 QUINCKE PK (NEEDLE) ×4 IMPLANT
NS IRRIG 1000ML POUR BTL (IV SOLUTION) ×2 IMPLANT
PACK LAMINECTOMY ORTHO (CUSTOM PROCEDURE TRAY) ×2 IMPLANT
PACK UNIVERSAL I (CUSTOM PROCEDURE TRAY) ×2 IMPLANT
PATTIES SURGICAL .5 X.5 (GAUZE/BANDAGES/DRESSINGS) IMPLANT
PATTIES SURGICAL .5 X1 (DISPOSABLE) ×2 IMPLANT
SPONGE LAP 4X18 RFD (DISPOSABLE) IMPLANT
SPONGE SURGIFOAM ABS GEL 100 (HEMOSTASIS) ×2 IMPLANT
SURGIFLO W/THROMBIN 8M KIT (HEMOSTASIS) IMPLANT
SUT BONE WAX W31G (SUTURE) ×2 IMPLANT
SUT MON AB 3-0 SH 27 (SUTURE) ×1
SUT MON AB 3-0 SH27 (SUTURE) ×1 IMPLANT
SUT PDS AB 3-0 SH 27 (SUTURE) ×1 IMPLANT
SUT VIC AB 1 CT1 18XCR BRD 8 (SUTURE) IMPLANT
SUT VIC AB 1 CT1 27 (SUTURE) ×2
SUT VIC AB 1 CT1 27XBRD ANTBC (SUTURE) ×2 IMPLANT
SUT VIC AB 1 CT1 8-18 (SUTURE) ×1
SUT VIC AB 2-0 CT1 18 (SUTURE) ×4 IMPLANT
SUT VICRYL 0 UR6 27IN ABS (SUTURE) ×2 IMPLANT
SYR BULB IRRIGATION 50ML (SYRINGE) ×2 IMPLANT
SYR CONTROL 10ML LL (SYRINGE) ×2 IMPLANT
TOWEL OR 17X26 10 PK STRL BLUE (TOWEL DISPOSABLE) ×4 IMPLANT
WATER STERILE IRR 1000ML POUR (IV SOLUTION) ×2 IMPLANT
YANKAUER SUCT BULB TIP NO VENT (SUCTIONS) ×2 IMPLANT

## 2018-06-24 NOTE — Transfer of Care (Signed)
Immediate Anesthesia Transfer of Care Note  Patient: Brian [name]  Procedure(s) Performed: DECOMPRESSIVE LUMBAR 4-5 LAMINECTOMY LEVEL 1 (N/A )  Patient Location: PACU  Anesthesia Type:General  Level of Consciousness: awake and alert   Airway & Oxygen Therapy: Patient Spontanous Breathing and Patient connected to face mask oxygen  Post-op Assessment: Report given to RN and Post -op Vital signs reviewed and stable  Post vital signs: Reviewed and stable  Last Vitals:  Vitals Value Taken Time  BP 91/62 06/24/2018 12:28 PM  Temp    Pulse 90 06/24/2018 12:29 PM  Resp 19 06/24/2018 12:29 PM  SpO2 92 % 06/24/2018 12:29 PM  Vitals shown include unvalidated device data.  Last Pain:  Vitals:   06/24/18 0607  TempSrc:   PainSc: 8       Patients Stated Pain Goal: 3 (11/94/17 4081)  Complications: No apparent anesthesia complications

## 2018-06-24 NOTE — Progress Notes (Signed)
Nurse spoke with Palliative NP, discussed symptoms related to previous ingestion of oxy before being transferred from [facility].  Wife refuses to allow oxy so NP will discontinue and fentanyl patch will be ordered as patient did have relief of symptoms with fentanyl patch application 21/30.

## 2018-06-24 NOTE — Consult Note (Signed)
            [facility] Primary Care Navigator  06/24/2018  Nicholos Aloisi [birth year] [phone]   Addendum:  Patient is off the unit for scheduled posterior decompression per ortho today (06/24/18).  Palliative care consulted and note states will continue to follow and assist with symptom management. Patient will need future discussions regarding goals of care with stage IV lung cancer. Discharge plan pending clinical course. Oncology is following.  Per Inpatient CM note, hopeful for CIR- [facility] once patient is able to tolerate required therapy for the rehab unit.  Primary care provider's office is listed as providing transition of care (TOC).   For additional questions please contact:  Edwena Felty A. Davidson Palmieri, BSN, RN-BC [facility] PRIMARY CARE Navigator Cell: [phone]

## 2018-06-24 NOTE — Progress Notes (Signed)
Pharmacy Antibiotic Note  Brian [name] is a 73 y.o. male admitted on 06/04/2018 with surgical prophylaxis.  Pharmacy has been consulted for vancomycin dosing.  Plan: -Vancomycin 1 gm IV prior to surgery followed by vancomycin 1 gm IV q 12 hours x 2 doses post op.   Height: 5\' 10"  (177.8 cm) Weight: 206 lb 12.7 oz (93.8 kg) IBW/kg (Calculated) : 73  Temp (24hrs), Avg:98.6 F (37 C), Min:98.4 F (36.9 C), Max:98.8 F (37.1 C)  Recent Labs  Lab 06/19/18 0448 06/20/18 0726  WBC 13.5*  --   CREATININE  --  0.81    Estimated Creatinine Clearance: 93.4 mL/min (by C-G formula based on SCr of 0.81 mg/dL).    Allergies  Allergen Reactions  . Minoxidil Palpitations  . Nicotine Palpitations  . Sulfamethoxazole Other (See Comments)    Both parents allergic, avoids med     Thank you for allowing pharmacy to be a part of this patient's care.  Albertina Parr, PharmD., BCPS Clinical Pharmacist Clinical phone for 06/24/17 until 3:30pm: [phone] If after 3:30pm, please refer to [facility] for unit-specific pharmacist

## 2018-06-24 NOTE — Anesthesia Preprocedure Evaluation (Signed)
Anesthesia Evaluation  Patient identified by MRN, date of birth, ID band Patient awake    Reviewed: Allergy & Precautions, NPO status , Patient's Chart, lab work & pertinent test results  Airway Mallampati: Intubated  TM Distance: >3 FB Neck ROM: Full    Dental no notable dental hx.    Pulmonary Current Smoker,  Intubated Lung cancer   Pulmonary exam normal breath sounds clear to auscultation       Cardiovascular negative cardio ROS Normal cardiovascular exam Rhythm:Regular Rate:Normal  ECG: SB, rate 57   Neuro/Psych negative neurological ROS  negative psych ROS   GI/Hepatic negative GI ROS, Neg liver ROS,   Endo/Other  negative endocrine ROS  Renal/GU negative Renal ROS     Musculoskeletal  (+) Arthritis , Rheumatoid disorders,  Metastatic cancer   Abdominal   Peds  Hematology  (+) Blood dyscrasia, anemia ,   Anesthesia Other Findings lumbar spine tumor  Reproductive/Obstetrics                             Anesthesia Physical  Anesthesia Plan  ASA: II  Anesthesia Plan: General   Post-op Pain Management:    Induction: Intravenous  PONV Risk Score and Plan: 2 and Ondansetron, Dexamethasone and Treatment may vary due to age or medical condition  Airway Management Planned: Oral ETT  Additional Equipment:   Intra-op Plan:   Post-operative Plan: Extubation in OR  Informed Consent: I have reviewed the patients History and Physical, chart, labs and discussed the procedure including the risks, benefits and alternatives for the proposed anesthesia with the patient or authorized representative who has indicated his/her understanding and acceptance.   Dental advisory given  Plan Discussed with: CRNA  Anesthesia Plan Comments:         Anesthesia Quick Evaluation

## 2018-06-24 NOTE — Progress Notes (Signed)
PT Cancellation Note  Patient Details Name: Brian [name] MRN: [id number] DOB: 1946-02-18   Cancelled Treatment:    Reason Eval/Treat Not Completed: Patient at procedure or test/unavailable Pt off floor. Will follow up as time allows.   [name] 06/24/2018, 9:43 AM Wray Kearns, PT, DPT Acute Rehabilitation Services Pager [phone] Office [phone]

## 2018-06-24 NOTE — Progress Notes (Signed)
Patient off unit for surgery.

## 2018-06-24 NOTE — Consult Note (Addendum)
[facility]                                                                                    Patient Demographics  Brian [name], is a 73 y.o. male  CSN: [id number]  MRN: [id number]  DOB - 1945-11-30  Admit Date - 06/04/2018  Outpatient Primary MD for the patient is [name], MD   With History of -  Past Medical History:  Diagnosis Date  . Arthritis       Past Surgical History:  Procedure Laterality Date  . ANTERIOR CERVICAL CORPECTOMY Left 06/11/2018   Procedure: L4 Corpectomy, L3-5 anterior reconstruction,;  Surgeon: Melina Schools, MD;  Location: [place];  Service: Orthopedics;  Laterality: Left;  6.5 hrs- Ok for this day/time per April  . CATARACT EXTRACTION, BILATERAL      in for   No chief complaint on file.    HPI  Brian [name]  is a 73 y.o. male, with past medical history significant for metastatic lung cancer and the pathological fracture and lesion causing severe spinal stenosis status post L4 corpectomy with reconstruction and posterior pedicle screw L3-L5.  Patient was admitted today for L4 posterior decompression and status post surgery, I was called to consult on his back and right-sided rash.  Patient is still drowsy, postop.  History was very hard to get.  According to nurse the rash was present presurgery.    Review of Systems    Unable to obtain due to patient's condition  Social History Social History   Tobacco Use  . Smoking status: Current Every Day Smoker    Packs/day: 0.25    Types: Cigarettes  . Smokeless tobacco: Never Used  Substance Use Topics  . Alcohol use: Not on file     Family History Family History  Problem Relation Age of Onset  . Diabetes Mother   . Heart attack Father   . Obesity Father   . Diabetes Sister      Prior to Admission medications   Medication Sig Start Date End Date Taking? Authorizing Provider  aspirin 81 MG chewable tablet Chew 81 mg by mouth daily.   Yes [provider]  Calcium Carb-Cholecalciferol (CALCIUM 600/VITAMIN D3) 600-800 MG-UNIT TABS Take 1 tablet by mouth daily.   Yes [provider]  docusate sodium (COLACE) 100 MG capsule Take 100 mg by mouth 2 (two) times daily as needed for mild constipation.   Yes [provider]  folic acid (FOLVITE) 1 MG tablet Take 1 mg by mouth daily.   Yes [provider]  Homeopathic Products (T-RELIEF PAIN RELIEF) CREA Apply 1 application topically 2 (two) times daily. Hip/Back pain   Yes [provider]  HYDROmorphone (DILAUDID) 4 MG tablet Take 4 mg by mouth every 6 (six) hours as needed for severe pain.   Yes [provider]  hydroxypropyl methylcellulose / hypromellose (ISOPTO TEARS / GONIOVISC) 2.5 % ophthalmic solution Place 1-2 drops into both eyes as needed for dry eyes.   Yes [provider]  ibuprofen (ADVIL,MOTRIN) 200 MG tablet Take 400 mg by mouth every 6 (six) hours as needed for headache or mild  pain.   Yes [provider]  Lysine 500 MG CAPS Take 500 mg by mouth daily as needed (chapped lips).   Yes [provider]  Omega-3 350 MG CPDR Take 350 mg by mouth daily.   Yes [provider]  Potassium 99 MG TABS Take 99 mg by mouth daily as needed (leg cramps).   Yes [provider]  pseudoephedrine (SUDAFED) 120 MG 12 hr tablet Take 120 mg by mouth every 12 (twelve) hours as needed for congestion.   Yes [provider]    Allergies  Allergen Reactions  . Minoxidil Palpitations  . Nicotine Palpitations  . Sulfamethoxazole Other (See Comments)    Both parents allergic, avoids med    Physical Exam  Vitals  Blood pressure 106/62, pulse 93, temperature 99 F (37.2 C), resp. rate 16, height 5\' 10"  (1.778 m), weight 93.8 kg, SpO2 92 %.   1. General drowsy , well-developed, well-nourished  2. Normal affect and insight, Not Suicidal or Homicidal,..  3. No F.N deficits, grossly, patient moving  all extremities.  4. Ears and Eyes appear Normal, Conjunctivae clear, PERRLA. Moist Oral Mucosa.  5. Supple Neck, No JVD, No cervical lymphadenopathy appriciated, No Carotid Bruits.  6. Symmetrical Chest wall movement, Good air movement bilaterally, CTAB.  7. RRR, No Gallops, Rubs or Murmurs, No Parasternal Heave.  8. Positive Bowel Sounds, Abdomen Soft, Non tender,.  9.  No Cyanosis, red , non-papular, blanching rash noted on his back and his right flank area suggestive of contact dermatitis  10. Good muscle tone,  joints appear normal , no effusions, Normal ROM.    Data Review  CBC Recent Labs  Lab 06/19/18 0448 06/21/18 0759 06/21/18 1920  WBC 13.5*  --   --   HGB 7.7* 7.0* 9.2*  HCT 23.3* 21.8* 28.2*  PLT 358  --   --   MCV 94.0  --   --   MCH 31.0  --   --   MCHC 33.0  --   --   RDW 15.7*  --   --   LYMPHSABS 2.1  --   --   MONOABS 1.1*  --   --   EOSABS 0.1  --   --   BASOSABS 0.1  --   --    ------------------------------------------------------------------------------------------------------------------  Chemistries  Recent Labs  Lab 06/20/18 0726  CREATININE 0.81   ------------------------------------------------------------------------------------------------------------------ estimated creatinine clearance is 93.4 mL/min (by C-G formula based on SCr of 0.81 mg/dL). ------------------------------------------------------------------------------------------------------------------ No results for input(s): TSH, T4TOTAL, T3FREE, THYROIDAB in the last 72 hours.  Invalid input(s): FREET3   Coagulation profile No results for input(s): INR, PROTIME in the last 168 hours. ------------------------------------------------------------------------------------------------------------------- No results for input(s): DDIMER in the last 72  hours. -------------------------------------------------------------------------------------------------------------------  Cardiac Enzymes No results for input(s): CKMB, TROPONINI, MYOGLOBIN in the last 168 hours.  Invalid input(s): CK ------------------------------------------------------------------------------------------------------------------ Invalid input(s): POCBNP   ---------------------------------------------------------------------------------------------------------------  Urinalysis No results found for: COLORURINE, APPEARANCEUR, LABSPEC, Cutter, GLUCOSEU, HGBUR, BILIRUBINUR, KETONESUR, PROTEINUR, UROBILINOGEN, NITRITE, LEUKOCYTESUR  ----------------------------------------------------------------------------------------------------------------  Imaging results:   Dg Lumbar Spine 2-3 Views  Result Date: 06/13/2018 CLINICAL DATA:  Corpectomy and fixation of the lower lumbar spine. EXAM: DG C-ARM 61-120 MIN; LUMBAR SPINE - 2-3 VIEW COMPARISON:  CT 06/11/2018. FINDINGS: Posterior rod fixation with pedicle screws in the L3 and L5 vertebral bodies. L4 corpectomy with a spacer device. IMPRESSION: Posterior lumbar fusion and L4 corpectomy. Electronically Signed   By: Suzy Bouchard M.D.   On: 06/13/2018 13:59  Dg Lumbar Spine 2-3 Views  Result Date: 06/11/2018 CLINICAL DATA:  L4 corpectomy EXAM: LUMBAR SPINE - 2-3 VIEW; DG C-ARM 61-120 MIN COMPARISON:  MRI 06/04/2018 FINDINGS: Five low resolution intraoperative spot views of the lumbar spine. Total fluoroscopy time was 3 minutes 19 seconds. Images were obtained during L4 corpectomy and placement of interbody cage. IMPRESSION: Intraoperative fluoroscopic assistance provided during lumbar spine surgery Electronically Signed   By: Donavan Foil M.D.   On: 06/11/2018 19:07   Ct Chest W Contrast  Result Date: 06/04/2018 CLINICAL DATA:  CLINICAL DATA Patient with a pathologic fracture in L4 on postmyelogram CT scan 06/02/2018.  Evaluate for primary and metastatic disease. EXAM: CT CHEST, ABDOMEN, AND PELVIS WITH CONTRAST TECHNIQUE: Multidetector CT imaging of the chest, abdomen and pelvis was performed following the standard protocol during bolus administration of intravenous contrast. CONTRAST:  100 mL OMNIPAQUE IOHEXOL 300 MG/ML  SOLN COMPARISON:  Postmyelogram lumbar spine CT scan 06/02/2018. FINDINGS: CT CHEST FINDINGS Cardiovascular: Heart size is normal. There is calcific aortic and coronary atherosclerosis. No pericardial effusion. Mediastinum/Nodes: Right hilar lymph node on image 37 measures 1.5 cm short axis dimension. Small node anterior to the right mainstem bronchus measuring 1 cm on image 33 also noted. A few smaller mediastinal lymph nodes are noted. No axillary or supraclavicular lymphadenopathy. Lungs/Pleura: The lungs demonstrate extensive centrilobular emphysematous disease. There is a mass in the right lower lobe which measures 7.3 cm craniocaudal on image 72 of series 7 by 8.1 cm transverse by 5.1 cm AP on image 108 of series 4. Subpleural nodule in the right middle lobe on image 120 measures 0.5 cm Musculoskeletal: A destructive lesion in the medial diaphysis of the left clavicle measures 3 cm long image 20 of series 4 and is approximately 1.4 cm medial to the clavicular head. A destructive lesion in the lateral arc of the right eighth rib measures 7 cm long by 3.1 cm transverse by 4.2 cm craniocaudal. Subtle lucent lesion lateral arc of the left seventh rib on image 105 of series 4 is also noted. No other focal bony abnormality is identified. A subcutaneous low attenuating lesion in the anterior left chest wall measures 1.8 cm AP x 2.6 cm transverse x 2.5 cm craniocaudal. CT ABDOMEN PELVIS FINDINGS Hepatobiliary: A 2.5 cm in diameter hypoattenuating lesion is seen in the left hepatic lobe near the dome on image 54. A punctate hypoattenuating lesion in the right hepatic lobe on image 70 is also identified. The liver  is otherwise unremarkable. A few stones are seen in the gallbladder but there is no evidence of cholecystitis. Biliary tree appears normal. Pancreas: Unremarkable. No pancreatic ductal dilatation or surrounding inflammatory changes. Spleen: Normal in size without focal abnormality. Adrenals/Urinary Tract: Mild thickening of the left adrenal gland is most suggestive of hyperplasia. The right adrenal gland is unremarkable. Stomach/Bowel: Stomach is within normal limits. No evidence of appendicitis. No evidence of bowel wall thickening, distention, or inflammatory changes. Vascular/Lymphatic: The patient has extensive calcific atherosclerosis. Mild aneurysmal dilatation of the descending abdominal aorta at 3.3 cm is identified. No lymphadenopathy. Reproductive: The prostate gland is mildly enlarged. Other: Small fat containing left inguinal hernia is noted. Musculoskeletal: Pathologic fracture of L4 is identified as seen on the comparison CT. No other bony abnormality is identified. Simple lipoma in the right gluteal musculature incidentally noted. IMPRESSION: Right lower lobe mass lesion most consistent with bronchogenic carcinoma. Destructive lesions in the left clavicle, right seventh ribs and L4 vertebral body are consistent with metastatic  disease. Small mediastinal lymph nodes measuring up to 1.5 cm are identified may also be pathologic. Two lesions in the liver are identified and worrisome for metastatic disease. Advanced emphysema. Calcific aortic and coronary atherosclerosis. 3.3 cm abdominal aortic aneurysm is identified. Recommend followup by ultrasound in 3 years. This recommendation follows ACR consensus guidelines: White Paper of the ACR Incidental Findings Committee II on Vascular Findings. J Am Coll Radiol 2013; 10:789-794 Gallstones without evidence of cholecystitis. Cystic lesion in the subcutaneous tissues of the anterior left chest wall is likely a sebaceous cyst. Simple lipoma right gluteal  musculature. Electronically Signed   By: Inge Rise M.D.   On: 06/04/2018 19:31   Ct Lumbar Spine Wo Contrast  Result Date: 06/23/2018 CLINICAL DATA:  Retroperitoneal hematoma follow-up EXAM: CT LUMBAR SPINE WITHOUT CONTRAST TECHNIQUE: Multidetector CT imaging of the lumbar spine was performed without intravenous contrast administration. Multiplanar CT image reconstructions were also generated. COMPARISON:  Lumbar spine MRI 06/18/2018 FINDINGS: Segmentation: 5 lumbar type vertebrae. Alignment: Normal. Vertebrae: Redemonstration of L3-5 posterior spinal fusion with corpectomy at L4. Normal appearance of the hardware. Paraspinal and other soft tissues: There are large right and small left retroperitoneal hematomas expanding the psoas muscles. The right-sided hematoma measures 19.2 by 10.0 x 6.7 cm. The left-sided hematoma measures 11.8 x 4.7 by 4.9 cm. Disc levels: No upper spinal canal stenosis. Assessment of the lower lumbar spinal canal is limited by streak artifact from the hardware. IMPRESSION: 1. Large right and small left retroperitoneal hematomas expanding the psoas muscles. Comparison to the MRI of 06/18/2018 is limited given differences in field of view, but sizes are approximately the same. 2. No acute fracture or static subluxation of the lumbar spine. The Electronically Signed   By: Ulyses Jarred M.D.   On: 06/23/2018 05:17   Ct Lumbar Spine W Contrast  Result Date: 06/02/2018 CLINICAL DATA:  Low back and bilateral leg pain. EXAM: LUMBAR MYELOGRAM FLUOROSCOPY TIME:  0 minutes 38 seconds. 142.58 micro gray meter squared PROCEDURE: After thorough discussion of risks and benefits of the procedure including bleeding, infection, injury to nerves, blood vessels, adjacent structures as well as headache and CSF leak, written and oral informed consent was obtained. Consent was obtained by Dr. [name]. Time out form was completed. Patient was positioned prone on the fluoroscopy table. Local  anesthesia was provided with 1% lidocaine without epinephrine after prepped and draped in the usual sterile fashion. Puncture was performed at L4-5 using a 3 1/2 inch 22-gauge spinal needle via right paramedian approach. Using a single pass through the dura, the needle was placed within the thecal sac, with return of clear CSF. 15 mL of Isovue M-200 was injected into the thecal sac, with normal opacification of the nerve roots and cauda equina consistent with free flow within the subarachnoid space. I personally performed the lumbar puncture and administered the intrathecal contrast. I also personally performed acquisition of the myelogram images. TECHNIQUE: Contiguous axial images were obtained through the Lumbar spine after the intrathecal infusion of infusion. Coronal and sagittal reconstructions were obtained of the axial image sets. COMPARISON:  Radiography 04/13/2018 FINDINGS: LUMBAR MYELOGRAM FINDINGS: There is no compressive central canal stenosis. There is an anterior extradural defect at the L4 level with an abnormal appearance of the posterior L4 vertebral body. There appears to be lytic change of the bone, new since the radiography of October. Extradural defect is present with some canal narrowing and lateral recess encroachment right more than left. L5-S1 shows disc space  narrowing. Standing flexion extension views show perhaps lighted crease in prominence of the extradural defect at L4. No antero or retrolisthesis is seen. CT LUMBAR MYELOGRAM FINDINGS: No significant abnormality is seen at L2-3 or above. The vertebral bodies appear normal. The disc spaces are unremarkable. No compressive stenosis. At L4, there is a pathologic compression fracture with lytic destruction. There is loss of height posteriorly of 40%. Extraosseous tumor encroaches upon the spinal canal and both of the L4-5 intervertebral foramina, right more than left. Extraosseous tumor extends from the superior endplate of L4 down as far  as the L4-5 disc. This is consistent with either metastatic carcinoma or myeloma. At L5-S1, there is chronic disc degeneration with vacuum phenomenon and bulging of the disc. There is bilateral facet degeneration. No central canal stenosis. There is bilateral foraminal narrowing right more than left. As shown at radiography, there is an infrarenal abdominal aortic aneurysm with transverse diameter maximal 3.8 cm. There are calcified gallstones within the gallbladder. No other regional finding. IMPRESSION: Pathologic fracture at L4 with maximal loss of height posteriorly of 40%. Lytic destruction of the L4 vertebral body. Extraosseous tumor encroaching upon the spinal canal at the L4 level, worse on the right than the left, with some foraminal encroachment worse on the right than the left at L4-5. This could be metastatic carcinoma or myeloma. Chronic degenerative disc disease at L5-S1 but without significant stenosis. Infrarenal abdominal aortic aneurysm with maximal transverse diameter of 3.8 cm. Recommend followup by ultrasound in 2 years. This recommendation follows ACR consensus guidelines: White Paper of the ACR Incidental Findings Committee II on Vascular Findings. J Am Coll Radiol 2013; 10:789-794. Chololithiasis. These results will be called to the ordering clinician or representative by the Radiologist Assistant, and communication documented in the PACS or zVision Dashboard. Electronically Signed   By: [name] M.D.   On: 06/02/2018 11:59   Mr Jeri Cos IR Contrast  Result Date: 06/07/2018 CLINICAL DATA:  Staging small cell lung cancer. EXAM: MRI HEAD WITHOUT AND WITH CONTRAST TECHNIQUE: Multiplanar, multiecho pulse sequences of the brain and surrounding structures were obtained without and with intravenous contrast. CONTRAST:  9 cc Gadavist. COMPARISON:  None. FINDINGS: Brain: Diffusion imaging does not show any acute or subacute infarction. There are a few old small vessel insults within the pons  and cerebellum. Cerebral hemispheres show moderate chronic small-vessel ischemic changes of the deep and subcortical white matter without large vessel territory infarction. No evidence of hemorrhage, hydrocephalus or extra-axial collection. After contrast administration, there is no abnormal enhancement to suggest primary or metastatic mass lesion. Vascular: Major vessels at the base of the brain show flow. Skull and upper cervical spine: Negative Sinuses/Orbits: Clear/normal Other: None IMPRESSION: No evidence of metastatic disease. Moderate chronic small-vessel ischemic changes affecting the brain as outlined above. Electronically Signed   By: [name] M.D.   On: 06/07/2018 14:45   Mr Lumbar Spine W Wo Contrast  Result Date: 06/18/2018 CLINICAL DATA:  Status post L4 corpectomy for resection of neoplasm. Increased low back pain. EXAM: MRI LUMBAR SPINE WITHOUT AND WITH CONTRAST TECHNIQUE: Multiplanar and multiecho pulse sequences of the lumbar spine were obtained without and with intravenous contrast. CONTRAST:  9 mL Gadavist COMPARISON:  Lumbar spine MRI 06/04/2018 FINDINGS: Segmentation:  Standard. Alignment:  Physiologic. Vertebrae: Status post L4 corpectomy and L3-L5 fusion. Bone marrow signal at the L4 level is largely obscured by susceptibility artifacts from the fusion/corpectomy hardware. Conus medullaris and cauda equina: Conus extends to the level.  Conus and cauda equina appear normal. Paraspinal and other soft tissues: The right psoas muscle is markedly expanded at the L4 level, with abnormal internal T1-weighted signal. No associated contrast enhancement. There is edema of the left psoas muscle. Disc levels: Severe spinal canal stenosis at the L4 level is unchanged compared to the prior study. The posterior bulging of the vertebral body is unchanged. There is severe right foraminal stenosis. The other disc levels are unchanged. IMPRESSION: 1. Retroperitoneal hematoma expanding the right psoas  muscle measuring up to 8.5 x 5.7 cm. 2. Status post L4 corpectomy and L3-5 fusion with unchanged severe spinal canal stenosis at the L4 level and severe right foraminal narrowing. 3. Magnetic susceptibility effects from the fusion and corpectomy hardware limit assessment of the bone marrow signal at the postoperative levels. These results will be called to the ordering clinician or representative by the Radiologist Assistant, and communication documented in the PACS or zVision Dashboard. Electronically Signed   By: Ulyses Jarred M.D.   On: 06/18/2018 22:21   Mr Lumbar Spine W Wo Contrast  Result Date: 06/04/2018 CLINICAL DATA:  Metastatic disease.  Lung cancer. EXAM: MRI LUMBAR SPINE WITHOUT AND WITH CONTRAST TECHNIQUE: Multiplanar and multiecho pulse sequences of the lumbar spine were obtained without and with intravenous contrast. CONTRAST:  7.5 mL Gadavist COMPARISON:  CT lumbar spine 06/02/2018 FINDINGS: Segmentation: Normal. The lowest disc space is considered to be L5-S1. Alignment:  Normal Vertebrae: There is loss of normal signal throughout the L4 vertebral body, where there is a known pathologic fracture with approximately 40% height loss. There is posterior convex bulging with multifocal contrast-enhancement. There is thickening of the overlying ventral dura with associated increased contrast enhancement. This results in severe spinal canal stenosis at this level. There are no other focal osseous lesions. Conus medullaris and cauda equina: The conus medullaris terminates at the L1 level. The cauda equina and conus medullaris are both normal. Paraspinal and other soft tissues: The visualized aorta, IVC and iliac vessels are normal. The visualized retroperitoneal organs and paraspinal soft tissues are normal. Disc levels: Sagittal plane imaging includes the T11-12 disc level through the upper sacrum, with axial imaging of the T12-L1 to L5-S1 disc levels. There is disc desiccation at the T12-L1, L1-L2  and L2-L3 levels without spinal canal stenosis or neural impingement. L3-4: Small disc bulge without associated stenosis. At the L4 level, there is severe spinal canal stenosis due to posterior bulging of the L4 vertebral body. L4-5: Minimal disc bulge. There is severe right neural foraminal stenosis due to the L4 mass. Mild left foraminal stenosis. L5-S1: Disc desiccation and mild endplate spurring. Moderate right neural foraminal stenosis. The visualized portion of the sacrum is normal. IMPRESSION: 1. Metastatic lesion of the L4 vertebral body with associated pathologic fracture and posterior bulging of the vertebral body. This, in combination with overlying epidural thickening, causes severe spinal canal stenosis and severe right neural foraminal stenosis. 2. Moderate right L5-S1 neural foraminal stenosis. Electronically Signed   By: Ulyses Jarred M.D.   On: 06/04/2018 22:20   Nm Bone Scan Whole Body  Result Date: 06/07/2018 CLINICAL DATA:  Metastatic spine tumor. EXAM: NUCLEAR MEDICINE WHOLE BODY BONE SCAN TECHNIQUE: Whole body anterior and posterior images were obtained approximately 3 hours after intravenous injection of radiopharmaceutical. RADIOPHARMACEUTICALS:  20.2 mCi Technetium-62m MDP IV COMPARISON:  MRI of June 04, 2018. CT scan of June 04, 2018. FINDINGS: Abnormal uptake is seen in left knee consistent with degenerative change. Abnormal uptake is seen  at L4 level consistent with metastatic lesion seen on prior MRI. Focus of abnormal uptake is seen involving the posterior portion of a right rib which is consistent with metastatic disease. Abnormal uptake is seen involving the anterior portions of the left first rib and left clavicle consistent with metastatic disease as described on prior CT scan. IMPRESSION: Abnormal uptake is seen in midportion of right rib, L4 vertebral body and anterior portions of left first rib and left clavicle consistent with metastatic disease. This correlates  with findings seen on prior CT scan. Electronically Signed   By: Marijo Conception, M.D.   On: 06/07/2018 13:43   Ct Abdomen Pelvis W Contrast  Result Date: 06/04/2018 CLINICAL DATA:  CLINICAL DATA Patient with a pathologic fracture in L4 on postmyelogram CT scan 06/02/2018. Evaluate for primary and metastatic disease. EXAM: CT CHEST, ABDOMEN, AND PELVIS WITH CONTRAST TECHNIQUE: Multidetector CT imaging of the chest, abdomen and pelvis was performed following the standard protocol during bolus administration of intravenous contrast. CONTRAST:  100 mL OMNIPAQUE IOHEXOL 300 MG/ML  SOLN COMPARISON:  Postmyelogram lumbar spine CT scan 06/02/2018. FINDINGS: CT CHEST FINDINGS Cardiovascular: Heart size is normal. There is calcific aortic and coronary atherosclerosis. No pericardial effusion. Mediastinum/Nodes: Right hilar lymph node on image 37 measures 1.5 cm short axis dimension. Small node anterior to the right mainstem bronchus measuring 1 cm on image 33 also noted. A few smaller mediastinal lymph nodes are noted. No axillary or supraclavicular lymphadenopathy. Lungs/Pleura: The lungs demonstrate extensive centrilobular emphysematous disease. There is a mass in the right lower lobe which measures 7.3 cm craniocaudal on image 72 of series 7 by 8.1 cm transverse by 5.1 cm AP on image 108 of series 4. Subpleural nodule in the right middle lobe on image 120 measures 0.5 cm Musculoskeletal: A destructive lesion in the medial diaphysis of the left clavicle measures 3 cm long image 20 of series 4 and is approximately 1.4 cm medial to the clavicular head. A destructive lesion in the lateral arc of the right eighth rib measures 7 cm long by 3.1 cm transverse by 4.2 cm craniocaudal. Subtle lucent lesion lateral arc of the left seventh rib on image 105 of series 4 is also noted. No other focal bony abnormality is identified. A subcutaneous low attenuating lesion in the anterior left chest wall measures 1.8 cm AP x 2.6 cm  transverse x 2.5 cm craniocaudal. CT ABDOMEN PELVIS FINDINGS Hepatobiliary: A 2.5 cm in diameter hypoattenuating lesion is seen in the left hepatic lobe near the dome on image 54. A punctate hypoattenuating lesion in the right hepatic lobe on image 70 is also identified. The liver is otherwise unremarkable. A few stones are seen in the gallbladder but there is no evidence of cholecystitis. Biliary tree appears normal. Pancreas: Unremarkable. No pancreatic ductal dilatation or surrounding inflammatory changes. Spleen: Normal in size without focal abnormality. Adrenals/Urinary Tract: Mild thickening of the left adrenal gland is most suggestive of hyperplasia. The right adrenal gland is unremarkable. Stomach/Bowel: Stomach is within normal limits. No evidence of appendicitis. No evidence of bowel wall thickening, distention, or inflammatory changes. Vascular/Lymphatic: The patient has extensive calcific atherosclerosis. Mild aneurysmal dilatation of the descending abdominal aorta at 3.3 cm is identified. No lymphadenopathy. Reproductive: The prostate gland is mildly enlarged. Other: Small fat containing left inguinal hernia is noted. Musculoskeletal: Pathologic fracture of L4 is identified as seen on the comparison CT. No other bony abnormality is identified. Simple lipoma in the right gluteal musculature incidentally noted.  IMPRESSION: Right lower lobe mass lesion most consistent with bronchogenic carcinoma. Destructive lesions in the left clavicle, right seventh ribs and L4 vertebral body are consistent with metastatic disease. Small mediastinal lymph nodes measuring up to 1.5 cm are identified may also be pathologic. Two lesions in the liver are identified and worrisome for metastatic disease. Advanced emphysema. Calcific aortic and coronary atherosclerosis. 3.3 cm abdominal aortic aneurysm is identified. Recommend followup by ultrasound in 3 years. This recommendation follows ACR consensus guidelines: White Paper  of the ACR Incidental Findings Committee II on Vascular Findings. J Am Coll Radiol 2013; 10:789-794 Gallstones without evidence of cholecystitis. Cystic lesion in the subcutaneous tissues of the anterior left chest wall is likely a sebaceous cyst. Simple lipoma right gluteal musculature. Electronically Signed   By: Inge Rise M.D.   On: 06/04/2018 19:31   Dg Lumbar Spine 1 View  Result Date: 06/24/2018 CLINICAL DATA:  Decompressive laminectomy L4-5 EXAM: LUMBAR SPINE - 1 VIEW COMPARISON:  None. FINDINGS: Changes of corpectomy at L4 and posterior fusion from L3-L5. Posterior needles are directed at the L4 corpectomy level. IMPRESSION: Intraoperative localization as above. Electronically Signed   By: Rolm Baptise M.D.   On: 06/24/2018 12:06   Dg Chest Port 1 View  Result Date: 06/13/2018 CLINICAL DATA:  ETT, Respiratory failure, non smoker. EXAM: PORTABLE CHEST 1 VIEW COMPARISON:  None. FINDINGS: Endotracheal tube and NG tube in good position. Linear density in the LEFT lower lobe unchanged. Persistent low lung volumes. Upper lungs clear. IMPRESSION: 1. No interval change.  Stable support apparatus. 2. Atelectasis in the LEFT lower lobe. Electronically Signed   By: Suzy Bouchard M.D.   On: 06/13/2018 07:32   Dg Chest Port 1 View  Result Date: 06/12/2018 CLINICAL DATA:  Right lower lobe lung mass. EXAM: PORTABLE CHEST 1 VIEW COMPARISON:  06/11/2018 FINDINGS: 1340 hours. Endotracheal tube tip is 7.2 cm above the base of the carina. The NG tube passes into the stomach although the distal tip position is not included on the film. Interval increase in left base atelectasis. Right lower lobe pulmonary mass again noted. Interstitial markings are diffusely coarsened with chronic features. Probable tiny pleural effusions. No worrisome lytic or sclerotic osseous abnormality. Telemetry leads overlie the chest. IMPRESSION: New left lower lobe atelectasis with potential small bilateral effusions. Right lower  lobe pulmonary mass. Chronic underlying interstitial lung disease. Electronically Signed   By: Misty Stanley M.D.   On: 06/12/2018 14:18   Dg Chest Port 1 View  Result Date: 06/11/2018 CLINICAL DATA:  Intubated. EXAM: PORTABLE CHEST 1 VIEW COMPARISON:  Chest CT 06/04/2018. FINDINGS: 2036 hours. Endotracheal tube tip is 8.2 cm above the base of the carina. Heart size upper normal. Interstitial markings are diffusely coarsened with chronic features. Known right lower lobe mass projects over the right hilum. There is right base atelectasis or infiltrate. IMPRESSION: 1. Endotracheal tube tip is 8.2 cm above the base the carina. 2. Known right lower lobe mass projects over the right hilum. 3. Chronic interstitial lung disease. Atelectasis or infiltrate noted at the right base. Electronically Signed   By: Misty Stanley M.D.   On: 06/11/2018 20:50   Dg Spine Portable 1 View  Result Date: 06/24/2018 CLINICAL DATA:  Tumor of L4 vertebral body. EXAM: PORTABLE SPINE - 1 VIEW COMPARISON:  MRI dated 06/18/2018 and CT scan dated 06/23/2018 FINDINGS: Lateral view of the lumbar spine demonstrates an instrument under the lamina of L4. Previous L4 partial corpectomy and posterior fusion from  L3-L5. IMPRESSION: Instrument under the lamina of L4. Electronically Signed   By: Lorriane Shire M.D.   On: 06/24/2018 11:20   Dg Abd Portable 1v  Result Date: 06/11/2018 CLINICAL DATA:  Orogastric tube placement. EXAM: PORTABLE ABDOMEN - 1 VIEW COMPARISON:  CT of earlier today FINDINGS: Nasogastric terminates at the body of the stomach. Bibasilar airspace disease, likely atelectasis. No gross free intraperitoneal air or bowel obstruction identified. IMPRESSION: Nasogastric terminating at the body of the stomach. Electronically Signed   By: Abigail Miyamoto M.D.   On: 06/11/2018 23:36   Dg C-arm 1-60 Min  Result Date: 06/13/2018 CLINICAL DATA:  Corpectomy and fixation of the lower lumbar spine. EXAM: DG C-ARM 61-120 MIN; LUMBAR  SPINE - 2-3 VIEW COMPARISON:  CT 06/11/2018. FINDINGS: Posterior rod fixation with pedicle screws in the L3 and L5 vertebral bodies. L4 corpectomy with a spacer device. IMPRESSION: Posterior lumbar fusion and L4 corpectomy. Electronically Signed   By: Suzy Bouchard M.D.   On: 06/13/2018 13:59   Dg C-arm 1-60 Min  Result Date: 06/11/2018 CLINICAL DATA:  L4 corpectomy EXAM: LUMBAR SPINE - 2-3 VIEW; DG C-ARM 61-120 MIN COMPARISON:  MRI 06/04/2018 FINDINGS: Five low resolution intraoperative spot views of the lumbar spine. Total fluoroscopy time was 3 minutes 19 seconds. Images were obtained during L4 corpectomy and placement of interbody cage. IMPRESSION: Intraoperative fluoroscopic assistance provided during lumbar spine surgery Electronically Signed   By: Donavan Foil M.D.   On: 06/11/2018 19:07   Dg Myelography Lumbar Inj Lumbosacral  Result Date: 06/02/2018 CLINICAL DATA:  Low back and bilateral leg pain. EXAM: LUMBAR MYELOGRAM FLUOROSCOPY TIME:  0 minutes 38 seconds. 142.58 micro gray meter squared PROCEDURE: After thorough discussion of risks and benefits of the procedure including bleeding, infection, injury to nerves, blood vessels, adjacent structures as well as headache and CSF leak, written and oral informed consent was obtained. Consent was obtained by Dr. [name]. Time out form was completed. Patient was positioned prone on the fluoroscopy table. Local anesthesia was provided with 1% lidocaine without epinephrine after prepped and draped in the usual sterile fashion. Puncture was performed at L4-5 using a 3 1/2 inch 22-gauge spinal needle via right paramedian approach. Using a single pass through the dura, the needle was placed within the thecal sac, with return of clear CSF. 15 mL of Isovue M-200 was injected into the thecal sac, with normal opacification of the nerve roots and cauda equina consistent with free flow within the subarachnoid space. I personally performed the lumbar  puncture and administered the intrathecal contrast. I also personally performed acquisition of the myelogram images. TECHNIQUE: Contiguous axial images were obtained through the Lumbar spine after the intrathecal infusion of infusion. Coronal and sagittal reconstructions were obtained of the axial image sets. COMPARISON:  Radiography 04/13/2018 FINDINGS: LUMBAR MYELOGRAM FINDINGS: There is no compressive central canal stenosis. There is an anterior extradural defect at the L4 level with an abnormal appearance of the posterior L4 vertebral body. There appears to be lytic change of the bone, new since the radiography of October. Extradural defect is present with some canal narrowing and lateral recess encroachment right more than left. L5-S1 shows disc space narrowing. Standing flexion extension views show perhaps lighted crease in prominence of the extradural defect at L4. No antero or retrolisthesis is seen. CT LUMBAR MYELOGRAM FINDINGS: No significant abnormality is seen at L2-3 or above. The vertebral bodies appear normal. The disc spaces are unremarkable. No compressive stenosis. At L4, there is  a pathologic compression fracture with lytic destruction. There is loss of height posteriorly of 40%. Extraosseous tumor encroaches upon the spinal canal and both of the L4-5 intervertebral foramina, right more than left. Extraosseous tumor extends from the superior endplate of L4 down as far as the L4-5 disc. This is consistent with either metastatic carcinoma or myeloma. At L5-S1, there is chronic disc degeneration with vacuum phenomenon and bulging of the disc. There is bilateral facet degeneration. No central canal stenosis. There is bilateral foraminal narrowing right more than left. As shown at radiography, there is an infrarenal abdominal aortic aneurysm with transverse diameter maximal 3.8 cm. There are calcified gallstones within the gallbladder. No other regional finding. IMPRESSION: Pathologic fracture at L4  with maximal loss of height posteriorly of 40%. Lytic destruction of the L4 vertebral body. Extraosseous tumor encroaching upon the spinal canal at the L4 level, worse on the right than the left, with some foraminal encroachment worse on the right than the left at L4-5. This could be metastatic carcinoma or myeloma. Chronic degenerative disc disease at L5-S1 but without significant stenosis. Infrarenal abdominal aortic aneurysm with maximal transverse diameter of 3.8 cm. Recommend followup by ultrasound in 2 years. This recommendation follows ACR consensus guidelines: White Paper of the ACR Incidental Findings Committee II on Vascular Findings. J Am Coll Radiol 2013; 10:789-794. Chololithiasis. These results will be called to the ordering clinician or representative by the Radiologist Assistant, and communication documented in the PACS or zVision Dashboard. Electronically Signed   By: [name] M.D.   On: 06/02/2018 11:59   Ct Angio Abd/pel W/ And/or W/o  Result Date: 06/11/2018 CLINICAL DATA:  73 year old male with concern for postoperative bleeding during corpectomy L4. History of known metastatic disease, bronchogenic carcinoma EXAM: CT ANGIOGRAPHY ABDOMEN AND PELVIS WITH CONTRAST AND WITHOUT CONTRAST TECHNIQUE: Multidetector CT imaging of the abdomen and pelvis was performed using the standard protocol during bolus administration of intravenous contrast. Multiplanar reconstructed images and MIPs were obtained and reviewed to evaluate the vascular anatomy. CONTRAST:  126mL ISOVUE-370 IOPAMIDOL (ISOVUE-370) INJECTION 76% COMPARISON:  CT 06/04/2018, 06/02/2018 FINDINGS: VASCULAR Aorta: Mild atherosclerotic changes of the abdominal aorta. Infrarenal abdominal aortic aneurysm with the largest diameter measuring 3.5 cm near the IMA origin. No periaortic fluid. No dissection. Median sacral artery is of small caliber anterior to the sacrum, does not appear to contribute to hematoma of the retroperitoneum.  Separate origin of bilateral L5 lumbar arteries. Contrast staining of the right paravertebral soft tissues/retroperitoneum/psoas muscle just lateral to the corpectomy site. Hematoma at this site measures 22 mm in AP diameter just lateral to the corpectomy hardware in the region of the tumor resection/treatment. Contrast staining within the psoas musculature, with the largest focus 10 mm. Celiac: Celiac artery patent with no significant atherosclerotic changes at the origin. Branches are patent. Replaced left hepatic artery. SMA: SMA patent with mild atherosclerotic changes at the origin, no significant stenosis. Branches are patent. Renals: Single right renal artery with no significant atherosclerotic changes at the origin. The main left renal artery originates from the 3 o'clock position with minimal atherosclerotic changes. There is an accessory left renal artery from the 1 o'clock position to the superior pole. IMA: IMA patent Right lower extremity: Tortuous right iliac system. Mild atherosclerotic changes of the iliac system. Hypogastric artery is patent. External iliac artery patent. Common femoral artery patent with mild atherosclerotic changes. Proximal SFA and profunda femoris patent. Left lower extremity: Tortuous left iliac system. Mild atherosclerotic changes. Hypogastric artery is  patent. External iliac artery patent. Mild atherosclerotic changes of the common femoral artery. Profunda femoris and SFA patent. Veins: Unremarkable appearance of the venous system. Review of the MIP images confirms the above findings. NON-VASCULAR Lower chest: Redemonstration of known right lower lobe mass with associated atelectasis/consolidation, incompletely imaged. Atelectasis at the dependent aspects of the left lower lobe. Redemonstration of right chest wall metastatic lesion centered within the angle of the right eighth rib. Greatest diameter on axial images measures 2.9 cm x 7.0 cm, relatively unchanged from the  comparison CT. Hepatobiliary: Redemonstration of segment 2 metastatic lesion with heterogeneous enhancement measures 3.2 cm, slightly larger than the comparison CT, though potentially measurement difference secondary to technique. Questionable small lesion in the lateral right liver measures 4 mm-5 mm. Heterogeneous enhancement in the inferior aspects of the right liver of uncertain significance. Calcified stones within the gallbladder. Pancreas: Unremarkable pancreas Spleen: Unremarkable spleen Adrenals/Urinary Tract: Unremarkable adrenal glands Right: No hydronephrosis. Symmetric perfusion to the left. No nephrolithiasis. Unremarkable course of the right ureter. Left: No hydronephrosis. Symmetric perfusion to the right. No nephrolithiasis. Unremarkable course of the left ureter. Balloon retention catheter in the urinary bladder. Air-fluid level within the urinary bladder, likely secondary to recent instrumentation. Stomach/Bowel: Distal esophagus is circumferentially thickened with hyperenhancement of the mucosal surface. Unremarkable stomach. Small bowel decompressed. No transition point or evidence of obstruction. Normal appendix colonic diverticula without evidence of acute inflammatory changes. Lymphatic: No lymphadenopathy Mesenteric: Postsurgical changes of the left abdominal wall with surgical drain from the surgical site of L4 traversing the left psoas and the left retroperitoneum and left abdominal wall. Minimal gas within the right psoas and left psoas as well as the left retroperitoneum. Reproductive: Transverse prostate measures 5.4 cm Other: None Musculoskeletal: Postsurgical changes of L4 corpectomy with hardware placement centered at the vertebral body site. Metallic streak artifact somewhat obscures details at this site. Relatively unchanged appearance of soft tissue posterior to the surgical site at the anterior aspect of the canal, seen on prior myelogram to narrow the canal with. The hematoma on  the right aspect of the vertebral body appears lateral and anterior to the canal. IMPRESSION: Interval postsurgical changes of L4 corpectomy with cage placement, and surgical drain from the left surgical site through the left retroperitoneum. Small hematoma in the retroperitoneum at the right lateral aspect of the spine extending into the psoas muscle, and in the setting of recent surgery could be venous or arterial. Similar appearance of soft tissue at the anterior aspect of the spinal canal at the surgical site, partially obscured by streak artifact on the current CT, and better characterized on the prior. Redemonstration of right right lower lobe lung mass and metastatic disease involving liver, right eighth rib/chest wall. Heterogeneous appearance of the right liver lobe, segment 6, on the venous phase could represent additional small metastatic lesions versus heterogeneous attenuation/perfusion. Circumferential esophageal thickening in the lower thoracic esophagus. Recommend correlation with history of GERD/esophagitis. Infrarenal abdominal aortic aneurysm measuring 3.5 cm. Aortic aneurysm NOS (ICD10-I71.9). Aortic Atherosclerosis (ICD10-I70.0). Electronically Signed   By: Corrie Mckusick D.O.   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 Patient is postop Keep on oxygen 4 L nasal cannula and encourage deep breathing  Contact dermatitis Start calamine lotion to back and flank area  COPD Start duo nebs  Will continue to follow patient up with you  DVT Prophylaxis  AM Labs Ordered, also please review Full Orders      Disposition Plan: To be determined  Time spent in minutes : 31 minutes     @SIGNATURE @

## 2018-06-24 NOTE — Progress Notes (Signed)
    Subjective: Procedure(s) (LRB): DECOMPRESSIVE LUMBAR 4-5 LAMINECTOMY LEVEL 1 (N/A) Day of Surgery  Patient reports pain as 5 on 0-10 scale.  Reports unchanged leg pain reports incisional back pain   Positive void Positive bowel movement Positive flatus Negative chest pain or shortness of breath  Objective: Vital signs in last 24 hours: Temp:  [98.4 F (36.9 C)-98.8 F (37.1 C)] 98.5 F (36.9 C) (01/01 2300) Pulse Rate:  [92-93] 92 (01/01 1633) BP: (114-145)/(75-81) 143/80 (01/01 2300) SpO2:  [95 %] 95 % (01/01 1633) Weight:  [93.8 kg] 93.8 kg (01/02 0932)  Intake/Output from previous day: 01/01 0701 - 01/02 0700 In: -  Out: 400 [Urine:400]  Labs: Recent Labs    06/21/18 1920  HCT 28.2*   No results for input(s): NA, K, CL, CO2, BUN, CREATININE, GLUCOSE, CALCIUM in the last 72 hours. No results for input(s): LABPT, INR in the last 72 hours.  Physical Exam: ABD soft Intact pulses distally Incision: dressing C/D/I Compartment soft Right flank: rash noted.  extends superiorly ove rthe scapula.   Body mass index is 29.67 kg/m. Neuro exam: unchanged  Assessment/Plan: Patient stable  CT scan: hardware in proper place - no migration.  Hematoma is noted.  Per IR nothing to drain Will monitor hematoma - if HCT drops and hematoma expands may need to consider evacuation and coagulation of right segmental vessel. Neuropathic leg pain persists - plan on formal decompression today.  Especially on the right side. Rash: if no improvement then consult dermatology in AM  Melina Schools, MD [facility] [phone]

## 2018-06-24 NOTE — Progress Notes (Signed)
Pharmacy Antibiotic Note  Brian [name] is a 73 y.o. male admitted on 06/04/2018 with surgical prophylaxis.  Pharmacy has been consulted for vanc dosing.  Pt is s/p decompressive laminectomy. He received one dose of vanc pre-op. Vanc was ordered for post op. He doesn't have any drain in place, therefore, he will get one dose post op. His CrCl~93  Plan: Vanc 1.25g IV x1 Rx sign off  Height: 5\' 10"  (177.8 cm) Weight: 206 lb 12.7 oz (93.8 kg) IBW/kg (Calculated) : 73  Temp (24hrs), Avg:98.5 F (36.9 C), Min:97.7 F (36.5 C), Max:99 F (37.2 C)  Recent Labs  Lab 06/19/18 0448 06/20/18 0726  WBC 13.5*  --   CREATININE  --  0.81    Estimated Creatinine Clearance: 93.4 mL/min (by C-G formula based on SCr of 0.81 mg/dL).    Allergies  Allergen Reactions  . Minoxidil Palpitations  . Nicotine Palpitations  . Sulfamethoxazole Other (See Comments)    Both parents allergic, avoids med    Onnie Boer, PharmD, [place], AAHIVP, CPP Infectious Disease Pharmacist 06/24/2018 2:43 PM

## 2018-06-24 NOTE — Progress Notes (Signed)
Radiation Oncology         [phone] ________________________________  Initial Inpatient Consultation  Name: Brian [name] MRN: [id number]  Date: 06/24/2018  DOB: [birth year]  IE:PPIRJ, Lenetta Quaker, MD  Volanda Napoleon, MD   REFERRING PHYSICIAN: Volanda Napoleon, MD  DIAGNOSIS: The encounter diagnosis was Lung cancer metastatic to bone [facility]).  HISTORY OF PRESENT ILLNESS::Brian [name] is a 73 y.o. male who was in his usual state of health until he developed progressive lower back discomfort and severe hip issues. He was not able to walk because of the pain. He saw Dr. Gladstone Lighter and a CT scan of the lumbar spine was done on 06/02/18. This showed a pathologic fracture of the L4 vertebral body. There was extraosseous tumor encroaching upon the spinal canal. This was worse on the right than on the left.   An MRI of the lumbar spine was then done on 06/04/18. This showed metastatic tumor at L4 vertebral body with associated pathologic fracture and posterior bulging of the vertebral body. It was felt that this was causing severe spinal canal stenosis and severe right neural foraminal stenosis.  Dr. Gladstone Lighter had the patient admitted. CT C/A/P scans were done on 06/04/18. Unfortunately, this did show a tumor in the right lower lobe measuring 7.3 x 8.1 x 5.1 cm. There were destructive lesions in the left clavicle, right 7th rib, and L4 vertebral body. Small mediastinal lymph nodes measuring up to 1.5 cm were identified. Two lesions in the liver were also identified.   Brain MRI on 06/07/18 showed no evidence of metastatic disease.   Bone biopsy of the L4 vertebral body on 06/11/18 revealed metastatic squamous cell carcinoma.   He underwent L4 corpectomy on 06/11/18 and posterior spinal fusion of L3-L5 on 06/13/18. He underwent decompressive lumbar 4-5 laminectomy earlier today.  He has been referred to radiation oncology consideration of radiation to his bone metastases.  PREVIOUS  RADIATION THERAPY: No  PAST MEDICAL HISTORY:  has a past medical history of Arthritis.    PAST SURGICAL HISTORY: Past Surgical History:  Procedure Laterality Date  . ANTERIOR CERVICAL CORPECTOMY Left 06/11/2018   Procedure: L4 Corpectomy, L3-5 anterior reconstruction,;  Surgeon: Melina Schools, MD;  Location: [place];  Service: Orthopedics;  Laterality: Left;  6.5 hrs- Ok for this day/time per April  . CATARACT EXTRACTION, BILATERAL    . DECOMPRESSIVE LUMBAR LAMINECTOMY LEVEL 1 N/A 06/24/2018   Procedure: DECOMPRESSIVE LUMBAR 4-5 LAMINECTOMY LEVEL 1;  Surgeon: Melina Schools, MD;  Location: [place];  Service: Orthopedics;  Laterality: N/A;    FAMILY HISTORY: family history includes Diabetes in his mother and sister; Heart attack in his father; Obesity in his father.  SOCIAL HISTORY:  reports that he has been smoking cigarettes. He has been smoking about 0.25 packs per day. He has never used smokeless tobacco. He reports that he does not use drugs.  ALLERGIES: Minoxidil; Nicotine; and Sulfamethoxazole  MEDICATIONS:  No current facility-administered medications for this encounter.    No current outpatient medications on file.   Facility-Administered Medications Ordered in Other Encounters  Medication Dose Route Frequency Provider Last Rate Last Dose  . 0.9 %  sodium chloride infusion  250 mL Intravenous Continuous Melina Schools, MD 1 mL/hr at 06/24/18 1521 250 mL at 06/24/18 1521  . acetaminophen (TYLENOL) tablet 650 mg  650 mg Oral Q4H PRN Melina Schools, MD   650 mg at 06/27/18 2019   Or  . acetaminophen (TYLENOL) suppository 650 mg  650  mg Rectal Q4H PRN Melina Schools, MD      . albuterol (PROVENTIL) (2.5 MG/3ML) 0.083% nebulizer solution 2.5 mg  2.5 mg Nebulization Q6H PRN Melina Schools, MD   2.5 mg at 06/18/18 1317  . bisacodyl (DULCOLAX) suppository 10 mg  10 mg Rectal Daily PRN Samuella Cota, MD   10 mg at 06/25/18 1443  . clobetasol ointment (TEMOVATE) 0.05 %   Topical BID  Rai, Ripudeep K, MD      . feeding supplement (ENSURE ENLIVE) (ENSURE ENLIVE) liquid 237 mL  237 mL Oral BID BM Melina Schools, MD   237 mL at 06/27/18 1018  . fentaNYL (DURAGESIC - dosed mcg/hr) 12.5 mcg  12.5 mcg Transdermal Q72H Basilio Cairo, NP   12.5 mcg at 06/27/18 1707  . gabapentin (NEURONTIN) capsule 300 mg  300 mg Oral TID Melina Schools, MD   300 mg at 06/27/18 1706  . guaiFENesin (ROBITUSSIN) 100 MG/5ML solution 200 mg  10 mL Oral Q6H Melina Schools, MD   200 mg at 06/27/18 1706  . HYDROcodone-acetaminophen (NORCO) 7.5-325 MG per tablet 1 tablet  1 tablet Oral Q8H PRN Cecilio Asper, Amber, PA-C   1 tablet at 06/27/18 1018  . HYDROmorphone (DILAUDID) injection 0.5 mg  0.5 mg Intravenous Q4H PRN Melina Schools, MD   0.5 mg at 06/24/18 0854  . HYDROmorphone HCl (DILAUDID) liquid 1 mg  1 mg Oral Q4H PRN Basilio Cairo, NP      . menthol-cetylpyridinium (CEPACOL) lozenge 3 mg  1 lozenge Oral PRN Melina Schools, MD       Or  . phenol (CHLORASEPTIC) mouth spray 1 spray  1 spray Mouth/Throat PRN Melina Schools, MD      . methocarbamol (ROBAXIN) tablet 500 mg  500 mg Oral Q6H PRN Melina Schools, MD   500 mg at 06/27/18 1323   Or  . methocarbamol (ROBAXIN) 500 mg in dextrose 5 % 50 mL IVPB  500 mg Intravenous Q6H PRN Melina Schools, MD 100 mL/hr at 06/25/18 0716 500 mg at 06/25/18 0716  . ondansetron (ZOFRAN) tablet 4 mg  4 mg Oral Q6H PRN Melina Schools, MD       Or  . ondansetron [facility]) injection 4 mg  4 mg Intravenous Q6H PRN Melina Schools, MD      . pantoprazole (PROTONIX) injection 40 mg  40 mg Intravenous QHS Melina Schools, MD   40 mg at 06/26/18 2142  . polyethylene glycol (MIRALAX / GLYCOLAX) packet 17 g  17 g Oral Daily Basilio Cairo, NP   17 g at 06/26/18 0907  . senna (SENOKOT) tablet 8.6 mg  1 tablet Oral Daily Basilio Cairo, NP   8.6 mg at 06/26/18 0906  . sodium chloride flush (NS) 0.9 % injection 3 mL  3 mL Intravenous Q12H Melina Schools, MD   3 mL at 06/27/18 1018  .  sodium chloride flush (NS) 0.9 % injection 3 mL  3 mL Intravenous PRN Melina Schools, MD      . sodium phosphate (FLEET) 7-19 GM/118ML enema 1 enema  1 enema Rectal Once PRN Melina Schools, MD        REVIEW OF SYSTEMS:  REVIEW OF SYSTEMS: A 10+ POINT REVIEW OF SYSTEMS WAS OBTAINED including neurology, dermatology, psychiatry, cardiac, respiratory, lymph, extremities, GI, GU, musculoskeletal, constitutional, reproductive, HEENT. All pertinent positives are noted in the HPI. All others are negative.   PHYSICAL EXAM: lying in bed, quite sedated from surgery earlier today. Responds appropriately to questions.  ECOG = 2  0 - Asymptomatic (Fully active, able to carry on all predisease activities without restriction)  1 - Symptomatic but completely ambulatory (Restricted in physically strenuous activity but ambulatory and able to carry out work of a light or sedentary nature. For example, light housework, office work)  2 - Symptomatic, <50% in bed during the day (Ambulatory and capable of all self care but unable to carry out any work activities. Up and about more than 50% of waking hours)  3 - Symptomatic, >50% in bed, but not bedbound (Capable of only limited self-care, confined to bed or chair 50% or more of waking hours)  4 - Bedbound (Completely disabled. Cannot carry on any self-care. Totally confined to bed or chair)  5 - Death   Eustace Pen MM, Creech RH, Tormey DC, et al. [phone]). "Toxicity and response criteria of the [facility]". [place] Oncol. 5 (6): 649-55  LABORATORY DATA:  Lab Results  Component Value Date   WBC 10.6 (H) 06/25/2018   HGB 8.0 (L) 06/25/2018   HCT 26.0 (L) 06/25/2018   MCV 93.9 06/25/2018   PLT 283 06/25/2018   NEUTROABS 9.8 (H) 06/19/2018   Lab Results  Component Value Date   NA 135 06/27/2018   K 3.7 06/27/2018   CL 104 06/27/2018   CO2 24 06/27/2018   GLUCOSE 105 (H) 06/27/2018   CREATININE 0.63 06/27/2018   CALCIUM 7.1 (L)  06/27/2018      RADIOGRAPHY: Dg Lumbar Spine 2-3 Views  Result Date: 06/13/2018 CLINICAL DATA:  Corpectomy and fixation of the lower lumbar spine. EXAM: DG C-ARM 61-120 MIN; LUMBAR SPINE - 2-3 VIEW COMPARISON:  CT 06/11/2018. FINDINGS: Posterior rod fixation with pedicle screws in the L3 and L5 vertebral bodies. L4 corpectomy with a spacer device. IMPRESSION: Posterior lumbar fusion and L4 corpectomy. Electronically Signed   By: Suzy Bouchard M.D.   On: 06/13/2018 13:59   Dg Lumbar Spine 2-3 Views  Result Date: 06/11/2018 CLINICAL DATA:  L4 corpectomy EXAM: LUMBAR SPINE - 2-3 VIEW; DG C-ARM 61-120 MIN COMPARISON:  MRI 06/04/2018 FINDINGS: Five low resolution intraoperative spot views of the lumbar spine. Total fluoroscopy time was 3 minutes 19 seconds. Images were obtained during L4 corpectomy and placement of interbody cage. IMPRESSION: Intraoperative fluoroscopic assistance provided during lumbar spine surgery Electronically Signed   By: Donavan Foil M.D.   On: 06/11/2018 19:07   Ct Chest W Contrast  Result Date: 06/04/2018 CLINICAL DATA:  CLINICAL DATA Patient with a pathologic fracture in L4 on postmyelogram CT scan 06/02/2018. Evaluate for primary and metastatic disease. EXAM: CT CHEST, ABDOMEN, AND PELVIS WITH CONTRAST TECHNIQUE: Multidetector CT imaging of the chest, abdomen and pelvis was performed following the standard protocol during bolus administration of intravenous contrast. CONTRAST:  100 mL OMNIPAQUE IOHEXOL 300 MG/ML  SOLN COMPARISON:  Postmyelogram lumbar spine CT scan 06/02/2018. FINDINGS: CT CHEST FINDINGS Cardiovascular: Heart size is normal. There is calcific aortic and coronary atherosclerosis. No pericardial effusion. Mediastinum/Nodes: Right hilar lymph node on image 37 measures 1.5 cm short axis dimension. Small node anterior to the right mainstem bronchus measuring 1 cm on image 33 also noted. A few smaller mediastinal lymph nodes are noted. No axillary or  supraclavicular lymphadenopathy. Lungs/Pleura: The lungs demonstrate extensive centrilobular emphysematous disease. There is a mass in the right lower lobe which measures 7.3 cm craniocaudal on image 72 of series 7 by 8.1 cm transverse by 5.1 cm AP on image 108 of series 4. Subpleural  nodule in the right middle lobe on image 120 measures 0.5 cm Musculoskeletal: A destructive lesion in the medial diaphysis of the left clavicle measures 3 cm long image 20 of series 4 and is approximately 1.4 cm medial to the clavicular head. A destructive lesion in the lateral arc of the right eighth rib measures 7 cm long by 3.1 cm transverse by 4.2 cm craniocaudal. Subtle lucent lesion lateral arc of the left seventh rib on image 105 of series 4 is also noted. No other focal bony abnormality is identified. A subcutaneous low attenuating lesion in the anterior left chest wall measures 1.8 cm AP x 2.6 cm transverse x 2.5 cm craniocaudal. CT ABDOMEN PELVIS FINDINGS Hepatobiliary: A 2.5 cm in diameter hypoattenuating lesion is seen in the left hepatic lobe near the dome on image 54. A punctate hypoattenuating lesion in the right hepatic lobe on image 70 is also identified. The liver is otherwise unremarkable. A few stones are seen in the gallbladder but there is no evidence of cholecystitis. Biliary tree appears normal. Pancreas: Unremarkable. No pancreatic ductal dilatation or surrounding inflammatory changes. Spleen: Normal in size without focal abnormality. Adrenals/Urinary Tract: Mild thickening of the left adrenal gland is most suggestive of hyperplasia. The right adrenal gland is unremarkable. Stomach/Bowel: Stomach is within normal limits. No evidence of appendicitis. No evidence of bowel wall thickening, distention, or inflammatory changes. Vascular/Lymphatic: The patient has extensive calcific atherosclerosis. Mild aneurysmal dilatation of the descending abdominal aorta at 3.3 cm is identified. No lymphadenopathy. Reproductive:  The prostate gland is mildly enlarged. Other: Small fat containing left inguinal hernia is noted. Musculoskeletal: Pathologic fracture of L4 is identified as seen on the comparison CT. No other bony abnormality is identified. Simple lipoma in the right gluteal musculature incidentally noted. IMPRESSION: Right lower lobe mass lesion most consistent with bronchogenic carcinoma. Destructive lesions in the left clavicle, right seventh ribs and L4 vertebral body are consistent with metastatic disease. Small mediastinal lymph nodes measuring up to 1.5 cm are identified may also be pathologic. Two lesions in the liver are identified and worrisome for metastatic disease. Advanced emphysema. Calcific aortic and coronary atherosclerosis. 3.3 cm abdominal aortic aneurysm is identified. Recommend followup by ultrasound in 3 years. This recommendation follows ACR consensus guidelines: White Paper of the ACR Incidental Findings Committee II on Vascular Findings. J Am Coll Radiol 2013; 10:789-794 Gallstones without evidence of cholecystitis. Cystic lesion in the subcutaneous tissues of the anterior left chest wall is likely a sebaceous cyst. Simple lipoma right gluteal musculature. Electronically Signed   By: Inge Rise M.D.   On: 06/04/2018 19:31   Ct Lumbar Spine Wo Contrast  Result Date: 06/23/2018 CLINICAL DATA:  Retroperitoneal hematoma follow-up EXAM: CT LUMBAR SPINE WITHOUT CONTRAST TECHNIQUE: Multidetector CT imaging of the lumbar spine was performed without intravenous contrast administration. Multiplanar CT image reconstructions were also generated. COMPARISON:  Lumbar spine MRI 06/18/2018 FINDINGS: Segmentation: 5 lumbar type vertebrae. Alignment: Normal. Vertebrae: Redemonstration of L3-5 posterior spinal fusion with corpectomy at L4. Normal appearance of the hardware. Paraspinal and other soft tissues: There are large right and small left retroperitoneal hematomas expanding the psoas muscles. The right-sided  hematoma measures 19.2 by 10.0 x 6.7 cm. The left-sided hematoma measures 11.8 x 4.7 by 4.9 cm. Disc levels: No upper spinal canal stenosis. Assessment of the lower lumbar spinal canal is limited by streak artifact from the hardware. IMPRESSION: 1. Large right and small left retroperitoneal hematomas expanding the psoas muscles. Comparison to the MRI of 06/18/2018  is limited given differences in field of view, but sizes are approximately the same. 2. No acute fracture or static subluxation of the lumbar spine. The Electronically Signed   By: Ulyses Jarred M.D.   On: 06/23/2018 05:17   Ct Lumbar Spine W Contrast  Result Date: 06/02/2018 CLINICAL DATA:  Low back and bilateral leg pain. EXAM: LUMBAR MYELOGRAM FLUOROSCOPY TIME:  0 minutes 38 seconds. 142.58 micro gray meter squared PROCEDURE: After thorough discussion of risks and benefits of the procedure including bleeding, infection, injury to nerves, blood vessels, adjacent structures as well as headache and CSF leak, written and oral informed consent was obtained. Consent was obtained by Dr. [name]. Time out form was completed. Patient was positioned prone on the fluoroscopy table. Local anesthesia was provided with 1% lidocaine without epinephrine after prepped and draped in the usual sterile fashion. Puncture was performed at L4-5 using a 3 1/2 inch 22-gauge spinal needle via right paramedian approach. Using a single pass through the dura, the needle was placed within the thecal sac, with return of clear CSF. 15 mL of Isovue M-200 was injected into the thecal sac, with normal opacification of the nerve roots and cauda equina consistent with free flow within the subarachnoid space. I personally performed the lumbar puncture and administered the intrathecal contrast. I also personally performed acquisition of the myelogram images. TECHNIQUE: Contiguous axial images were obtained through the Lumbar spine after the intrathecal infusion of infusion. Coronal  and sagittal reconstructions were obtained of the axial image sets. COMPARISON:  Radiography 04/13/2018 FINDINGS: LUMBAR MYELOGRAM FINDINGS: There is no compressive central canal stenosis. There is an anterior extradural defect at the L4 level with an abnormal appearance of the posterior L4 vertebral body. There appears to be lytic change of the bone, new since the radiography of October. Extradural defect is present with some canal narrowing and lateral recess encroachment right more than left. L5-S1 shows disc space narrowing. Standing flexion extension views show perhaps lighted crease in prominence of the extradural defect at L4. No antero or retrolisthesis is seen. CT LUMBAR MYELOGRAM FINDINGS: No significant abnormality is seen at L2-3 or above. The vertebral bodies appear normal. The disc spaces are unremarkable. No compressive stenosis. At L4, there is a pathologic compression fracture with lytic destruction. There is loss of height posteriorly of 40%. Extraosseous tumor encroaches upon the spinal canal and both of the L4-5 intervertebral foramina, right more than left. Extraosseous tumor extends from the superior endplate of L4 down as far as the L4-5 disc. This is consistent with either metastatic carcinoma or myeloma. At L5-S1, there is chronic disc degeneration with vacuum phenomenon and bulging of the disc. There is bilateral facet degeneration. No central canal stenosis. There is bilateral foraminal narrowing right more than left. As shown at radiography, there is an infrarenal abdominal aortic aneurysm with transverse diameter maximal 3.8 cm. There are calcified gallstones within the gallbladder. No other regional finding. IMPRESSION: Pathologic fracture at L4 with maximal loss of height posteriorly of 40%. Lytic destruction of the L4 vertebral body. Extraosseous tumor encroaching upon the spinal canal at the L4 level, worse on the right than the left, with some foraminal encroachment worse on the  right than the left at L4-5. This could be metastatic carcinoma or myeloma. Chronic degenerative disc disease at L5-S1 but without significant stenosis. Infrarenal abdominal aortic aneurysm with maximal transverse diameter of 3.8 cm. Recommend followup by ultrasound in 2 years. This recommendation follows ACR consensus guidelines: White Paper of  the ACR Incidental Findings Committee II on Vascular Findings. J Am Coll Radiol 2013; 10:789-794. Chololithiasis. These results will be called to the ordering clinician or representative by the Radiologist Assistant, and communication documented in the PACS or zVision Dashboard. Electronically Signed   By: [name] M.D.   On: 06/02/2018 11:59   Mr Jeri Cos RW Contrast  Result Date: 06/07/2018 CLINICAL DATA:  Staging small cell lung cancer. EXAM: MRI HEAD WITHOUT AND WITH CONTRAST TECHNIQUE: Multiplanar, multiecho pulse sequences of the brain and surrounding structures were obtained without and with intravenous contrast. CONTRAST:  9 cc Gadavist. COMPARISON:  None. FINDINGS: Brain: Diffusion imaging does not show any acute or subacute infarction. There are a few old small vessel insults within the pons and cerebellum. Cerebral hemispheres show moderate chronic small-vessel ischemic changes of the deep and subcortical white matter without large vessel territory infarction. No evidence of hemorrhage, hydrocephalus or extra-axial collection. After contrast administration, there is no abnormal enhancement to suggest primary or metastatic mass lesion. Vascular: Major vessels at the base of the brain show flow. Skull and upper cervical spine: Negative Sinuses/Orbits: Clear/normal Other: None IMPRESSION: No evidence of metastatic disease. Moderate chronic small-vessel ischemic changes affecting the brain as outlined above. Electronically Signed   By: [name] M.D.   On: 06/07/2018 14:45   Mr Lumbar Spine W Wo Contrast  Result Date: 06/18/2018 CLINICAL DATA:  Status  post L4 corpectomy for resection of neoplasm. Increased low back pain. EXAM: MRI LUMBAR SPINE WITHOUT AND WITH CONTRAST TECHNIQUE: Multiplanar and multiecho pulse sequences of the lumbar spine were obtained without and with intravenous contrast. CONTRAST:  9 mL Gadavist COMPARISON:  Lumbar spine MRI 06/04/2018 FINDINGS: Segmentation:  Standard. Alignment:  Physiologic. Vertebrae: Status post L4 corpectomy and L3-L5 fusion. Bone marrow signal at the L4 level is largely obscured by susceptibility artifacts from the fusion/corpectomy hardware. Conus medullaris and cauda equina: Conus extends to the level. Conus and cauda equina appear normal. Paraspinal and other soft tissues: The right psoas muscle is markedly expanded at the L4 level, with abnormal internal T1-weighted signal. No associated contrast enhancement. There is edema of the left psoas muscle. Disc levels: Severe spinal canal stenosis at the L4 level is unchanged compared to the prior study. The posterior bulging of the vertebral body is unchanged. There is severe right foraminal stenosis. The other disc levels are unchanged. IMPRESSION: 1. Retroperitoneal hematoma expanding the right psoas muscle measuring up to 8.5 x 5.7 cm. 2. Status post L4 corpectomy and L3-5 fusion with unchanged severe spinal canal stenosis at the L4 level and severe right foraminal narrowing. 3. Magnetic susceptibility effects from the fusion and corpectomy hardware limit assessment of the bone marrow signal at the postoperative levels. These results will be called to the ordering clinician or representative by the Radiologist Assistant, and communication documented in the PACS or zVision Dashboard. Electronically Signed   By: Ulyses Jarred M.D.   On: 06/18/2018 22:21   Mr Lumbar Spine W Wo Contrast  Result Date: 06/04/2018 CLINICAL DATA:  Metastatic disease.  Lung cancer. EXAM: MRI LUMBAR SPINE WITHOUT AND WITH CONTRAST TECHNIQUE: Multiplanar and multiecho pulse sequences of  the lumbar spine were obtained without and with intravenous contrast. CONTRAST:  7.5 mL Gadavist COMPARISON:  CT lumbar spine 06/02/2018 FINDINGS: Segmentation: Normal. The lowest disc space is considered to be L5-S1. Alignment:  Normal Vertebrae: There is loss of normal signal throughout the L4 vertebral body, where there is a known pathologic fracture with approximately  40% height loss. There is posterior convex bulging with multifocal contrast-enhancement. There is thickening of the overlying ventral dura with associated increased contrast enhancement. This results in severe spinal canal stenosis at this level. There are no other focal osseous lesions. Conus medullaris and cauda equina: The conus medullaris terminates at the L1 level. The cauda equina and conus medullaris are both normal. Paraspinal and other soft tissues: The visualized aorta, IVC and iliac vessels are normal. The visualized retroperitoneal organs and paraspinal soft tissues are normal. Disc levels: Sagittal plane imaging includes the T11-12 disc level through the upper sacrum, with axial imaging of the T12-L1 to L5-S1 disc levels. There is disc desiccation at the T12-L1, L1-L2 and L2-L3 levels without spinal canal stenosis or neural impingement. L3-4: Small disc bulge without associated stenosis. At the L4 level, there is severe spinal canal stenosis due to posterior bulging of the L4 vertebral body. L4-5: Minimal disc bulge. There is severe right neural foraminal stenosis due to the L4 mass. Mild left foraminal stenosis. L5-S1: Disc desiccation and mild endplate spurring. Moderate right neural foraminal stenosis. The visualized portion of the sacrum is normal. IMPRESSION: 1. Metastatic lesion of the L4 vertebral body with associated pathologic fracture and posterior bulging of the vertebral body. This, in combination with overlying epidural thickening, causes severe spinal canal stenosis and severe right neural foraminal stenosis. 2. Moderate  right L5-S1 neural foraminal stenosis. Electronically Signed   By: Ulyses Jarred M.D.   On: 06/04/2018 22:20   Nm Bone Scan Whole Body  Result Date: 06/07/2018 CLINICAL DATA:  Metastatic spine tumor. EXAM: NUCLEAR MEDICINE WHOLE BODY BONE SCAN TECHNIQUE: Whole body anterior and posterior images were obtained approximately 3 hours after intravenous injection of radiopharmaceutical. RADIOPHARMACEUTICALS:  20.2 mCi Technetium-24m MDP IV COMPARISON:  MRI of June 04, 2018. CT scan of June 04, 2018. FINDINGS: Abnormal uptake is seen in left knee consistent with degenerative change. Abnormal uptake is seen at L4 level consistent with metastatic lesion seen on prior MRI. Focus of abnormal uptake is seen involving the posterior portion of a right rib which is consistent with metastatic disease. Abnormal uptake is seen involving the anterior portions of the left first rib and left clavicle consistent with metastatic disease as described on prior CT scan. IMPRESSION: Abnormal uptake is seen in midportion of right rib, L4 vertebral body and anterior portions of left first rib and left clavicle consistent with metastatic disease. This correlates with findings seen on prior CT scan. Electronically Signed   By: Marijo Conception, M.D.   On: 06/07/2018 13:43   Ct Abdomen Pelvis W Contrast  Result Date: 06/04/2018 CLINICAL DATA:  CLINICAL DATA Patient with a pathologic fracture in L4 on postmyelogram CT scan 06/02/2018. Evaluate for primary and metastatic disease. EXAM: CT CHEST, ABDOMEN, AND PELVIS WITH CONTRAST TECHNIQUE: Multidetector CT imaging of the chest, abdomen and pelvis was performed following the standard protocol during bolus administration of intravenous contrast. CONTRAST:  100 mL OMNIPAQUE IOHEXOL 300 MG/ML  SOLN COMPARISON:  Postmyelogram lumbar spine CT scan 06/02/2018. FINDINGS: CT CHEST FINDINGS Cardiovascular: Heart size is normal. There is calcific aortic and coronary atherosclerosis. No  pericardial effusion. Mediastinum/Nodes: Right hilar lymph node on image 37 measures 1.5 cm short axis dimension. Small node anterior to the right mainstem bronchus measuring 1 cm on image 33 also noted. A few smaller mediastinal lymph nodes are noted. No axillary or supraclavicular lymphadenopathy. Lungs/Pleura: The lungs demonstrate extensive centrilobular emphysematous disease. There is a mass in the right  lower lobe which measures 7.3 cm craniocaudal on image 72 of series 7 by 8.1 cm transverse by 5.1 cm AP on image 108 of series 4. Subpleural nodule in the right middle lobe on image 120 measures 0.5 cm Musculoskeletal: A destructive lesion in the medial diaphysis of the left clavicle measures 3 cm long image 20 of series 4 and is approximately 1.4 cm medial to the clavicular head. A destructive lesion in the lateral arc of the right eighth rib measures 7 cm long by 3.1 cm transverse by 4.2 cm craniocaudal. Subtle lucent lesion lateral arc of the left seventh rib on image 105 of series 4 is also noted. No other focal bony abnormality is identified. A subcutaneous low attenuating lesion in the anterior left chest wall measures 1.8 cm AP x 2.6 cm transverse x 2.5 cm craniocaudal. CT ABDOMEN PELVIS FINDINGS Hepatobiliary: A 2.5 cm in diameter hypoattenuating lesion is seen in the left hepatic lobe near the dome on image 54. A punctate hypoattenuating lesion in the right hepatic lobe on image 70 is also identified. The liver is otherwise unremarkable. A few stones are seen in the gallbladder but there is no evidence of cholecystitis. Biliary tree appears normal. Pancreas: Unremarkable. No pancreatic ductal dilatation or surrounding inflammatory changes. Spleen: Normal in size without focal abnormality. Adrenals/Urinary Tract: Mild thickening of the left adrenal gland is most suggestive of hyperplasia. The right adrenal gland is unremarkable. Stomach/Bowel: Stomach is within normal limits. No evidence of  appendicitis. No evidence of bowel wall thickening, distention, or inflammatory changes. Vascular/Lymphatic: The patient has extensive calcific atherosclerosis. Mild aneurysmal dilatation of the descending abdominal aorta at 3.3 cm is identified. No lymphadenopathy. Reproductive: The prostate gland is mildly enlarged. Other: Small fat containing left inguinal hernia is noted. Musculoskeletal: Pathologic fracture of L4 is identified as seen on the comparison CT. No other bony abnormality is identified. Simple lipoma in the right gluteal musculature incidentally noted. IMPRESSION: Right lower lobe mass lesion most consistent with bronchogenic carcinoma. Destructive lesions in the left clavicle, right seventh ribs and L4 vertebral body are consistent with metastatic disease. Small mediastinal lymph nodes measuring up to 1.5 cm are identified may also be pathologic. Two lesions in the liver are identified and worrisome for metastatic disease. Advanced emphysema. Calcific aortic and coronary atherosclerosis. 3.3 cm abdominal aortic aneurysm is identified. Recommend followup by ultrasound in 3 years. This recommendation follows ACR consensus guidelines: White Paper of the ACR Incidental Findings Committee II on Vascular Findings. J Am Coll Radiol 2013; 10:789-794 Gallstones without evidence of cholecystitis. Cystic lesion in the subcutaneous tissues of the anterior left chest wall is likely a sebaceous cyst. Simple lipoma right gluteal musculature. Electronically Signed   By: Inge Rise M.D.   On: 06/04/2018 19:31   Dg Lumbar Spine 1 View  Result Date: 06/24/2018 CLINICAL DATA:  Decompressive laminectomy L4-5 EXAM: LUMBAR SPINE - 1 VIEW COMPARISON:  None. FINDINGS: Changes of corpectomy at L4 and posterior fusion from L3-L5. Posterior needles are directed at the L4 corpectomy level. IMPRESSION: Intraoperative localization as above. Electronically Signed   By: Rolm Baptise M.D.   On: 06/24/2018 12:06   Dg Chest  Port 1 View  Result Date: 06/13/2018 CLINICAL DATA:  ETT, Respiratory failure, non smoker. EXAM: PORTABLE CHEST 1 VIEW COMPARISON:  None. FINDINGS: Endotracheal tube and NG tube in good position. Linear density in the LEFT lower lobe unchanged. Persistent low lung volumes. Upper lungs clear. IMPRESSION: 1. No interval change.  Stable support apparatus.  2. Atelectasis in the LEFT lower lobe. Electronically Signed   By: Suzy Bouchard M.D.   On: 06/13/2018 07:32   Dg Chest Port 1 View  Result Date: 06/12/2018 CLINICAL DATA:  Right lower lobe lung mass. EXAM: PORTABLE CHEST 1 VIEW COMPARISON:  06/11/2018 FINDINGS: 1340 hours. Endotracheal tube tip is 7.2 cm above the base of the carina. The NG tube passes into the stomach although the distal tip position is not included on the film. Interval increase in left base atelectasis. Right lower lobe pulmonary mass again noted. Interstitial markings are diffusely coarsened with chronic features. Probable tiny pleural effusions. No worrisome lytic or sclerotic osseous abnormality. Telemetry leads overlie the chest. IMPRESSION: New left lower lobe atelectasis with potential small bilateral effusions. Right lower lobe pulmonary mass. Chronic underlying interstitial lung disease. Electronically Signed   By: Misty Stanley M.D.   On: 06/12/2018 14:18   Dg Chest Port 1 View  Result Date: 06/11/2018 CLINICAL DATA:  Intubated. EXAM: PORTABLE CHEST 1 VIEW COMPARISON:  Chest CT 06/04/2018. FINDINGS: 2036 hours. Endotracheal tube tip is 8.2 cm above the base of the carina. Heart size upper normal. Interstitial markings are diffusely coarsened with chronic features. Known right lower lobe mass projects over the right hilum. There is right base atelectasis or infiltrate. IMPRESSION: 1. Endotracheal tube tip is 8.2 cm above the base the carina. 2. Known right lower lobe mass projects over the right hilum. 3. Chronic interstitial lung disease. Atelectasis or infiltrate noted at  the right base. Electronically Signed   By: Misty Stanley M.D.   On: 06/11/2018 20:50   Dg Spine Portable 1 View  Result Date: 06/24/2018 CLINICAL DATA:  Tumor of L4 vertebral body. EXAM: PORTABLE SPINE - 1 VIEW COMPARISON:  MRI dated 06/18/2018 and CT scan dated 06/23/2018 FINDINGS: Lateral view of the lumbar spine demonstrates an instrument under the lamina of L4. Previous L4 partial corpectomy and posterior fusion from L3-L5. IMPRESSION: Instrument under the lamina of L4. Electronically Signed   By: Lorriane Shire M.D.   On: 06/24/2018 11:20   Dg Abd Portable 1v  Result Date: 06/11/2018 CLINICAL DATA:  Orogastric tube placement. EXAM: PORTABLE ABDOMEN - 1 VIEW COMPARISON:  CT of earlier today FINDINGS: Nasogastric terminates at the body of the stomach. Bibasilar airspace disease, likely atelectasis. No gross free intraperitoneal air or bowel obstruction identified. IMPRESSION: Nasogastric terminating at the body of the stomach. Electronically Signed   By: Abigail Miyamoto M.D.   On: 06/11/2018 23:36   Dg C-arm 1-60 Min  Result Date: 06/13/2018 CLINICAL DATA:  Corpectomy and fixation of the lower lumbar spine. EXAM: DG C-ARM 61-120 MIN; LUMBAR SPINE - 2-3 VIEW COMPARISON:  CT 06/11/2018. FINDINGS: Posterior rod fixation with pedicle screws in the L3 and L5 vertebral bodies. L4 corpectomy with a spacer device. IMPRESSION: Posterior lumbar fusion and L4 corpectomy. Electronically Signed   By: Suzy Bouchard M.D.   On: 06/13/2018 13:59   Dg C-arm 1-60 Min  Result Date: 06/11/2018 CLINICAL DATA:  L4 corpectomy EXAM: LUMBAR SPINE - 2-3 VIEW; DG C-ARM 61-120 MIN COMPARISON:  MRI 06/04/2018 FINDINGS: Five low resolution intraoperative spot views of the lumbar spine. Total fluoroscopy time was 3 minutes 19 seconds. Images were obtained during L4 corpectomy and placement of interbody cage. IMPRESSION: Intraoperative fluoroscopic assistance provided during lumbar spine surgery Electronically Signed   By: Donavan Foil M.D.   On: 06/11/2018 19:07   Dg Myelography Lumbar Inj Lumbosacral  Result Date: 06/02/2018 CLINICAL DATA:  Low back and bilateral leg pain. EXAM: LUMBAR MYELOGRAM FLUOROSCOPY TIME:  0 minutes 38 seconds. 142.58 micro gray meter squared PROCEDURE: After thorough discussion of risks and benefits of the procedure including bleeding, infection, injury to nerves, blood vessels, adjacent structures as well as headache and CSF leak, written and oral informed consent was obtained. Consent was obtained by Dr. [name]. Time out form was completed. Patient was positioned prone on the fluoroscopy table. Local anesthesia was provided with 1% lidocaine without epinephrine after prepped and draped in the usual sterile fashion. Puncture was performed at L4-5 using a 3 1/2 inch 22-gauge spinal needle via right paramedian approach. Using a single pass through the dura, the needle was placed within the thecal sac, with return of clear CSF. 15 mL of Isovue M-200 was injected into the thecal sac, with normal opacification of the nerve roots and cauda equina consistent with free flow within the subarachnoid space. I personally performed the lumbar puncture and administered the intrathecal contrast. I also personally performed acquisition of the myelogram images. TECHNIQUE: Contiguous axial images were obtained through the Lumbar spine after the intrathecal infusion of infusion. Coronal and sagittal reconstructions were obtained of the axial image sets. COMPARISON:  Radiography 04/13/2018 FINDINGS: LUMBAR MYELOGRAM FINDINGS: There is no compressive central canal stenosis. There is an anterior extradural defect at the L4 level with an abnormal appearance of the posterior L4 vertebral body. There appears to be lytic change of the bone, new since the radiography of October. Extradural defect is present with some canal narrowing and lateral recess encroachment right more than left. L5-S1 shows disc space narrowing.  Standing flexion extension views show perhaps lighted crease in prominence of the extradural defect at L4. No antero or retrolisthesis is seen. CT LUMBAR MYELOGRAM FINDINGS: No significant abnormality is seen at L2-3 or above. The vertebral bodies appear normal. The disc spaces are unremarkable. No compressive stenosis. At L4, there is a pathologic compression fracture with lytic destruction. There is loss of height posteriorly of 40%. Extraosseous tumor encroaches upon the spinal canal and both of the L4-5 intervertebral foramina, right more than left. Extraosseous tumor extends from the superior endplate of L4 down as far as the L4-5 disc. This is consistent with either metastatic carcinoma or myeloma. At L5-S1, there is chronic disc degeneration with vacuum phenomenon and bulging of the disc. There is bilateral facet degeneration. No central canal stenosis. There is bilateral foraminal narrowing right more than left. As shown at radiography, there is an infrarenal abdominal aortic aneurysm with transverse diameter maximal 3.8 cm. There are calcified gallstones within the gallbladder. No other regional finding. IMPRESSION: Pathologic fracture at L4 with maximal loss of height posteriorly of 40%. Lytic destruction of the L4 vertebral body. Extraosseous tumor encroaching upon the spinal canal at the L4 level, worse on the right than the left, with some foraminal encroachment worse on the right than the left at L4-5. This could be metastatic carcinoma or myeloma. Chronic degenerative disc disease at L5-S1 but without significant stenosis. Infrarenal abdominal aortic aneurysm with maximal transverse diameter of 3.8 cm. Recommend followup by ultrasound in 2 years. This recommendation follows ACR consensus guidelines: White Paper of the ACR Incidental Findings Committee II on Vascular Findings. J Am Coll Radiol 2013; 10:789-794. Chololithiasis. These results will be called to the ordering clinician or representative by  the Radiologist Assistant, and communication documented in the PACS or zVision Dashboard. Electronically Signed   By: [name] M.D.   On: 06/02/2018 11:59  Ct Angio Abd/pel W/ And/or W/o  Result Date: 06/11/2018 CLINICAL DATA:  73 year old male with concern for postoperative bleeding during corpectomy L4. History of known metastatic disease, bronchogenic carcinoma EXAM: CT ANGIOGRAPHY ABDOMEN AND PELVIS WITH CONTRAST AND WITHOUT CONTRAST TECHNIQUE: Multidetector CT imaging of the abdomen and pelvis was performed using the standard protocol during bolus administration of intravenous contrast. Multiplanar reconstructed images and MIPs were obtained and reviewed to evaluate the vascular anatomy. CONTRAST:  159mL ISOVUE-370 IOPAMIDOL (ISOVUE-370) INJECTION 76% COMPARISON:  CT 06/04/2018, 06/02/2018 FINDINGS: VASCULAR Aorta: Mild atherosclerotic changes of the abdominal aorta. Infrarenal abdominal aortic aneurysm with the largest diameter measuring 3.5 cm near the IMA origin. No periaortic fluid. No dissection. Median sacral artery is of small caliber anterior to the sacrum, does not appear to contribute to hematoma of the retroperitoneum. Separate origin of bilateral L5 lumbar arteries. Contrast staining of the right paravertebral soft tissues/retroperitoneum/psoas muscle just lateral to the corpectomy site. Hematoma at this site measures 22 mm in AP diameter just lateral to the corpectomy hardware in the region of the tumor resection/treatment. Contrast staining within the psoas musculature, with the largest focus 10 mm. Celiac: Celiac artery patent with no significant atherosclerotic changes at the origin. Branches are patent. Replaced left hepatic artery. SMA: SMA patent with mild atherosclerotic changes at the origin, no significant stenosis. Branches are patent. Renals: Single right renal artery with no significant atherosclerotic changes at the origin. The main left renal artery originates from the 3  o'clock position with minimal atherosclerotic changes. There is an accessory left renal artery from the 1 o'clock position to the superior pole. IMA: IMA patent Right lower extremity: Tortuous right iliac system. Mild atherosclerotic changes of the iliac system. Hypogastric artery is patent. External iliac artery patent. Common femoral artery patent with mild atherosclerotic changes. Proximal SFA and profunda femoris patent. Left lower extremity: Tortuous left iliac system. Mild atherosclerotic changes. Hypogastric artery is patent. External iliac artery patent. Mild atherosclerotic changes of the common femoral artery. Profunda femoris and SFA patent. Veins: Unremarkable appearance of the venous system. Review of the MIP images confirms the above findings. NON-VASCULAR Lower chest: Redemonstration of known right lower lobe mass with associated atelectasis/consolidation, incompletely imaged. Atelectasis at the dependent aspects of the left lower lobe. Redemonstration of right chest wall metastatic lesion centered within the angle of the right eighth rib. Greatest diameter on axial images measures 2.9 cm x 7.0 cm, relatively unchanged from the comparison CT. Hepatobiliary: Redemonstration of segment 2 metastatic lesion with heterogeneous enhancement measures 3.2 cm, slightly larger than the comparison CT, though potentially measurement difference secondary to technique. Questionable small lesion in the lateral right liver measures 4 mm-5 mm. Heterogeneous enhancement in the inferior aspects of the right liver of uncertain significance. Calcified stones within the gallbladder. Pancreas: Unremarkable pancreas Spleen: Unremarkable spleen Adrenals/Urinary Tract: Unremarkable adrenal glands Right: No hydronephrosis. Symmetric perfusion to the left. No nephrolithiasis. Unremarkable course of the right ureter. Left: No hydronephrosis. Symmetric perfusion to the right. No nephrolithiasis. Unremarkable course of the left  ureter. Balloon retention catheter in the urinary bladder. Air-fluid level within the urinary bladder, likely secondary to recent instrumentation. Stomach/Bowel: Distal esophagus is circumferentially thickened with hyperenhancement of the mucosal surface. Unremarkable stomach. Small bowel decompressed. No transition point or evidence of obstruction. Normal appendix colonic diverticula without evidence of acute inflammatory changes. Lymphatic: No lymphadenopathy Mesenteric: Postsurgical changes of the left abdominal wall with surgical drain from the surgical site of L4 traversing the left psoas and the left  retroperitoneum and left abdominal wall. Minimal gas within the right psoas and left psoas as well as the left retroperitoneum. Reproductive: Transverse prostate measures 5.4 cm Other: None Musculoskeletal: Postsurgical changes of L4 corpectomy with hardware placement centered at the vertebral body site. Metallic streak artifact somewhat obscures details at this site. Relatively unchanged appearance of soft tissue posterior to the surgical site at the anterior aspect of the canal, seen on prior myelogram to narrow the canal with. The hematoma on the right aspect of the vertebral body appears lateral and anterior to the canal. IMPRESSION: Interval postsurgical changes of L4 corpectomy with cage placement, and surgical drain from the left surgical site through the left retroperitoneum. Small hematoma in the retroperitoneum at the right lateral aspect of the spine extending into the psoas muscle, and in the setting of recent surgery could be venous or arterial. Similar appearance of soft tissue at the anterior aspect of the spinal canal at the surgical site, partially obscured by streak artifact on the current CT, and better characterized on the prior. Redemonstration of right right lower lobe lung mass and metastatic disease involving liver, right eighth rib/chest wall. Heterogeneous appearance of the right liver  lobe, segment 6, on the venous phase could represent additional small metastatic lesions versus heterogeneous attenuation/perfusion. Circumferential esophageal thickening in the lower thoracic esophagus. Recommend correlation with history of GERD/esophagitis. Infrarenal abdominal aortic aneurysm measuring 3.5 cm. Aortic aneurysm NOS (ICD10-I71.9). Aortic Atherosclerosis (ICD10-I70.0). Electronically Signed   By: Corrie Mckusick D.O.   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 The patient would be a good candidate for post-op radiation therapy directed at the surgical site in the L4 region.  We will need to wait some time since the patient has had extensive surgery in this area.   PLAN:post op radiation therapy at later date.  Anticipate 2-3 weeks of radiation therapy.    ------------------------------------------------  Blair Promise, PhD, MD  This document serves as a record of services personally performed by Gery Pray, MD. It was created on his behalf by Rae Lips, a trained medical scribe. The creation of this record is based on the scribe's personal observations and the provider's statements to them. This document has been checked and approved by the attending provider.

## 2018-06-24 NOTE — Brief Op Note (Signed)
06/24/2018  12:31 PM  PATIENT:  Brian Brian [name]  73 y.o. male  PRE-OPERATIVE DIAGNOSIS:  spine tumor  POST-OPERATIVE DIAGNOSIS:  spine tumor  PROCEDURE:  Procedure(s): DECOMPRESSIVE LUMBAR 4-5 LAMINECTOMY LEVEL 1 (N/A)  SURGEON:  Surgeon(s) and Role:    Brian Schools, MD - Primary  PHYSICIAN ASSISTANT:   ASSISTANTS: none   ANESTHESIA:   general  EBL:  25 mL   BLOOD ADMINISTERED:none  DRAINS: none   LOCAL MEDICATIONS USED:  MARCAINE     SPECIMEN:  Source of Specimen:  posterior laminotomy site  DISPOSITION OF SPECIMEN:  PATHOLOGY  COUNTS:  YES  TOURNIQUET:  * No tourniquets in log *  DICTATION: .Dragon Dictation  PLAN OF CARE: Admit to inpatient   PATIENT DISPOSITION:  PACU - hemodynamically Brian [name].

## 2018-06-24 NOTE — Progress Notes (Signed)
Dr. Rolena Infante phoned Nurse for an update of patient condition post surgery.    Patient looks much better in color and his disposition is improved.   Better pain control, refused dilaudid and was given tylenol for a "slight" headache.  Fentanyl patch reapplied.

## 2018-06-24 NOTE — Op Note (Signed)
Operative report  Preoperative diagnosis: Metastatic lung cancer.  Status post L4 corpectomy with L3 anterior reconstruction and fusion with posterior L3-5 pedicle screw fixation  Postoperative diagnosis: Same  Operative procedure L4/5 Gill decompression.   Estimated blood loss: Minimal  Complications: None  Anesthesia: General  Indications: Brian [name] is a very pleasant 73 year old gentleman who was diagnosed with metastatic lung cancer.  He had a pathological L4 fracture and lesion causing severe spinal stenosis.  The patient has already undergone an L4 corpectomy with reconstruction and posterior pedicle screw supplementation from L3-L5.  Unfortunately the indirect decompression and stabilization did not provide an adequate amount of neural decompression.  He continued to have neuropathic leg pain.  As a result repeat MRI demonstrated ongoing severe foraminal stenosis at L4 as well as lateral recess stenosis.  As a result we elected to take him back for formal posterior decompression.  All appropriate risks benefits and alternatives to surgery were discussed.  Operative report: Patient is brought the operating room placed by the operating room table.  After successful induction of general anesthesia and endotracheal the patient teds SCDs were applied and he was turned prone onto the Wilson frame.  There was an extensive rash that was noted yesterday over the right flank and spine.  It was not felt to be infectious in origin.  The patient's previous wounds were clean dry and intact and healing well with no signs of infection.  As result I elected to move forward with the decompression.  The back was prepped and draped in a standard fashion.  Timeout was taken to confirm patient procedure and all other important data.  An x-ray was taken with 2 needles in the back to confirm location for incision.  Incision was marked out and a inch and a half posterior incision was made midline.  Sharp dissection  was carried out down to the deep fascia.  I stripped the deep fascia and expose the L4 and L5 spinous process, lamina and facet complex.  I was able to palpate the pedicle screw head at L5.  Self-retaining retractor was placed and a Penfield 4 was placed on the L4 lamina.  A second intraoperative x-ray was taken confirming I was at the appropriate level.  The majority of the L4 spinous process was removed with a Warehouse manager.  I then remove the majority of the lamina with the double-action Leksell rongeur.  A Penfield 4 was then used to dissect through the central raphae of the ligamentum flavum and developed a plane between the ligamentum flavum and the thecal sac.  2 and 3 mm Kerrison rongeurs were used to resect the ligamentum flavum.  There was some small amount of tumor mass visible which was removed and sent to permanent pathology.  I continued my decompression into the lateral recess.  I then resected the entire right L4 inferior facet.  I then continue to resect the entire L4 pars.  I then continued my dissection removing more of the right lamina of L4.  At this point I could visualize the remaining portion of the L4 pedicle as well as the L4 nerve root.  The nerve root was visible as it exited the thecal sac traversed its way to the L4 foramen.  I had performed a complete foraminotomy of L4 on the right side as well as complete lateral recess decompression.  I was able to identify the L5 nerve root and track it into the L5 foramen.  The decompression was taken out so  that had adequate visualization of both nerve roots especially on that right side.  On the left side I was able to easily palpate with my [facility] elevator into the lateral recess superiorly and inferiorly.  At this point I was very pleased with the decompression.  I did complete right sided facetectomy and foraminotomy thereby adequately decompressing the exiting and traversing nerve root as well as the central canal.  I then had  the CRNA performed a Valsalva to 40 mmHg hold for 10 seconds.  There was no CSF leak.  Hemostasis was then obtained using bipolar electrocautery and FloSeal.  After final irrigation I closed in a layered fashion with interrupted #1 Vicryl suture, 2-0 Vicryl suture, and a 3-0 PDS subcuticular for the skin.  At this point time Steri-Strips were applied to this and fresh dry dressings were applied.  The patient was ultimately extubated transfer the PACU without incident.  The end of the case all needle sponge counts were correct.  There were no adverse intraoperative events.

## 2018-06-24 NOTE — Progress Notes (Signed)
Daily Progress Note   Patient Name: Brian [name]       Date: 06/24/2018 DOB: 20-May-1946  Age: 73 y.o. MRN#: [id number] Attending Physician: Melina Schools, MD Primary Care Physician: [name], MD Admit Date: 06/04/2018  Reason for Consultation/Follow-up: Establishing goals of care and Pain control  Subjective: Patient wakes to voice. Recently returned from surgery. Appears very comfortable. Tells me he plans to rest this afternoon. His wife is not currently at bedside.   Discussed plan of care with RN.   Length of Stay: 20  Current Medications: Scheduled Meds:  . calamine  1 application Topical BID  . feeding supplement (ENSURE ENLIVE)  237 mL Oral BID BM  . fentaNYL  12.5 mcg Transdermal Q72H  . gabapentin  300 mg Oral TID  . guaiFENesin  10 mL Oral Q6H  . ipratropium-albuterol  3 mL Nebulization Q6H  . pantoprazole (PROTONIX) IV  40 mg Intravenous QHS  . [START ON 06/25/2018] polyethylene glycol  17 g Oral Daily  . [START ON 06/25/2018] senna  1 tablet Oral Daily  . sodium chloride flush  3 mL Intravenous Q12H    Continuous Infusions: . sodium chloride 250 mL (06/24/18 1521)  . lactated ringers 85 mL/hr at 06/24/18 1517  . methocarbamol (ROBAXIN) IV    . vancomycin      PRN Meds: acetaminophen **OR** acetaminophen, albuterol, HYDROmorphone (DILAUDID) injection, HYDROmorphone HCl, menthol-cetylpyridinium **OR** phenol, methocarbamol **OR** methocarbamol (ROBAXIN) IV, ondansetron **OR** ondansetron (ZOFRAN) IV, sodium chloride flush, sodium phosphate  Physical Exam Vitals signs and nursing note reviewed.  Constitutional:      General: He is awake.  HENT:     Head: Normocephalic and atraumatic.  Cardiovascular:     Rate and Rhythm: Normal rate.  Pulmonary:   Effort: No tachypnea, accessory muscle usage or respiratory distress.  Abdominal:     Tenderness: There is no abdominal tenderness.  Skin:    General: Skin is warm and dry.     Comments: Erythematous, blanchable, non-pruritic rash mid-lower back and flank. TRH following.  Neurological:     Mental Status: He is alert and oriented to person, place, and time.  Psychiatric:        Mood and Affect: Mood normal.        Speech: Speech normal.  Behavior: Behavior normal.        Cognition and Memory: Cognition normal.            Vital Signs: BP 115/81 (BP Location: Left Arm)   Pulse 89   Temp 97.6 F (36.4 C) (Oral)   Resp 18   Ht 5\' 10"  (1.778 m)   Wt 93.8 kg   SpO2 93%   BMI 29.67 kg/m  SpO2: SpO2: 93 % O2 Device: O2 Device: Nasal Cannula O2 Flow Rate: O2 Flow Rate (L/min): 2 L/min  Intake/output summary:   Intake/Output Summary (Last 24 hours) at 06/24/2018 1559 Last data filed at 06/24/2018 1150 Gross per 24 hour  Intake 700 ml  Output 25 ml  Net 675 ml   LBM: Last BM Date: 07/23/18 Baseline Weight: Weight: 89.9 kg Most recent weight: Weight: 93.8 kg       Palliative Assessment/Data: PPS 40%   Flowsheet Rows     Most Recent Value  Intake Tab  Referral Department  -- [Ortho/oncology]  Unit at Time of Referral  Intermediate Care Unit  Palliative Care Primary Diagnosis  Cancer  Date Notified  06/22/18  Palliative Care Type  New Palliative care  Reason for referral  Clarify Goals of Care, Pain  Date of Admission  06/04/18  Date first seen by Palliative Care  06/22/18  # of days IP prior to Palliative referral  18  Clinical Assessment  Palliative Performance Scale Score  40%  Psychosocial & Spiritual Assessment  Palliative Care Outcomes  Patient/Family meeting held?  Yes  Who was at the meeting?  patient and wife  Palliative Care Outcomes  Improved pain interventions, Improved non-pain symptom therapy, Provided psychosocial or spiritual support       Patient Active Problem List   Diagnosis Date Noted  . S/P lumbar fusion 06/24/2018  . Palliative care by specialist   . Cancer related pain   . Lung cancer, primary, with metastasis from lung to other site, right ([place]) 06/14/2018  . Squamous cell lung cancer, right ([place]) 06/14/2018  . Lung cancer metastatic to bone ([place]) 06/14/2018  . Bronchitis due to tobacco use 06/07/2018  . Lumbar spine tumor 06/04/2018    Palliative Care Assessment & Plan   Patient Profile: 73 y.o. male  with past medical history of rheumatoid arthritis admitted on 06/04/2018 with low back pain, radicular pain into right buttocks and leg, and rib/left shoulder pain. CT myelogram ordered outpatient and revealed L4 pathologic compression fracture with lytic destruction and extraosseous tumor encroaching into spinal canal and L4-L5 intervertebral foramina. Seen in outpatient ortho office and decision made for direct admission for further workup. 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 Patient s/p LV corpectomy on 12/20 and L3-L5 spinal fusion on 12/22. Pending posterior decompression surgery this week. Oncology following. Interventions are not curable but palliative in nature. Radiation and ? Other oncology options pending clinical condition. Palliative medicine consultation for goals of care/pain management.    Assessment: Metastatic lung cancer to liver and bone L4 compression fracture s/p spinal fusion Cancer related pain Deconditioning Hx of rheumatoid arthritis  Recommendations/Plan:  Revised MAR post-surgery  Fentanyl 12.47mcg TD restarted. (Patient and wife do not want him to receive Oxycontin after 1.5 day episode of nausea/vomiting when this medication was given at [facility]. See PMT consult note for initial discussion).   Dilaudid 1mg  PO q4h prn pain. IV dilaudid available for breakthrough pain.   Miralax & Senna scheduled daily  PRN  Robaxin  Patient and wife feel improvement in pain control with initiation of fentanyl patch. Requiring less frequent doses of prn dilaudid. Continue current symptom management regimen.   PMT will continue to follow and assist with symptom management. Patient will need future discussions regarding goals of care with stage IV lung cancer. Pending clinical course. Oncology following.   Goals of Care and Additional Recommendations:  Limitations on Scope of Treatment: Full Scope Treatment  Code Status: FULL   Code Status Orders  (From admission, onward)         Start     Ordered   06/11/18 2030  Full code  Continuous     06/11/18 2030        Code Status History    Date Active Date Inactive Code Status Order ID Comments User Context   06/11/2018 2011 06/11/2018 2030 Full Code [id number]  Melina Schools, MD Inpatient   06/04/2018 1344 06/11/2018 2011 Full Code [id number]  Ardeen Jourdain, PA-C Inpatient       Prognosis:   Unable to determine  Discharge Planning:  To Be Determined  Care plan was discussed with patient, RN, message sent to Dr. Rolena Infante.  Thank you for allowing the Palliative Medicine Team to assist in the care of this patient.   Time In: 1415 Time Out: 1440 Total Time 25 Prolonged Time Billed no      Greater than 50%  of this time was spent counseling and coordinating care related to the above assessment and plan.  Ihor Dow, DNP, FNP-C Palliative Medicine Team  Phone: [phone] Fax: [phone]  Please contact Palliative Medicine Team phone at [phone] for questions and concerns.

## 2018-06-24 NOTE — Anesthesia Procedure Notes (Signed)
Procedure Name: Intubation Date/Time: 06/24/2018 10:26 AM Performed by: Lieutenant Diego, CRNA Pre-anesthesia Checklist: Patient identified, Emergency Drugs available, Suction available and Patient being monitored Patient Re-evaluated:Patient Re-evaluated prior to induction Oxygen Delivery Method: Circle system utilized Preoxygenation: Pre-oxygenation with 100% oxygen Induction Type: IV induction Ventilation: Mask ventilation without difficulty Tube type: Oral Number of attempts: 1 Airway Equipment and Method: Stylet Placement Confirmation: ETT inserted through vocal cords under direct vision,  positive ETCO2 and breath sounds checked- equal and bilateral Secured at: 24 cm Tube secured with: Tape Dental Injury: Teeth and Oropharynx as per pre-operative assessment

## 2018-06-25 ENCOUNTER — Encounter (HOSPITAL_COMMUNITY): Payer: Self-pay | Admitting: Orthopedic Surgery

## 2018-06-25 DIAGNOSIS — E876 Hypokalemia: Secondary | ICD-10-CM

## 2018-06-25 DIAGNOSIS — R21 Rash and other nonspecific skin eruption: Secondary | ICD-10-CM

## 2018-06-25 DIAGNOSIS — D649 Anemia, unspecified: Secondary | ICD-10-CM

## 2018-06-25 LAB — CBC
HCT: 26 % — ABNORMAL LOW (ref 39.0–52.0)
Hemoglobin: 8 g/dL — ABNORMAL LOW (ref 13.0–17.0)
MCH: 28.9 pg (ref 26.0–34.0)
MCHC: 30.8 g/dL (ref 30.0–36.0)
MCV: 93.9 fL (ref 80.0–100.0)
NRBC: 0 % (ref 0.0–0.2)
Platelets: 283 10*3/uL (ref 150–400)
RBC: 2.77 MIL/uL — ABNORMAL LOW (ref 4.22–5.81)
RDW: 16.4 % — AB (ref 11.5–15.5)
WBC: 10.6 10*3/uL — ABNORMAL HIGH (ref 4.0–10.5)

## 2018-06-25 LAB — BASIC METABOLIC PANEL
Anion gap: 8 (ref 5–15)
BUN: 7 mg/dL — ABNORMAL LOW (ref 8–23)
CALCIUM: 6.6 mg/dL — AB (ref 8.9–10.3)
CO2: 24 mmol/L (ref 22–32)
Chloride: 106 mmol/L (ref 98–111)
Creatinine, Ser: 0.71 mg/dL (ref 0.61–1.24)
GFR calc Af Amer: >60 mL/min (ref >60)
GFR calc non Af Amer: >60 mL/min (ref >60)
Glucose, Bld: 147 mg/dL — ABNORMAL HIGH (ref 70–99)
Potassium: 2.8 mmol/L — ABNORMAL LOW (ref 3.5–5.1)
Sodium: 138 mmol/L (ref 135–145)

## 2018-06-25 MED ORDER — BISACODYL 10 MG RE SUPP
10.0000 mg | Freq: Every day | RECTAL | Status: DC | PRN
  Administered 2018-06-25: 10 mg via RECTAL
  Filled 2018-06-25: qty 1

## 2018-06-25 MED ORDER — POTASSIUM CHLORIDE CRYS ER 20 MEQ PO TBCR
40.0000 meq | EXTENDED_RELEASE_TABLET | Freq: Two times a day (BID) | ORAL | Status: AC
  Administered 2018-06-25 (×2): 40 meq via ORAL
  Filled 2018-06-25 (×2): qty 2

## 2018-06-25 MED FILL — Thrombin (Recombinant) For Soln 20000 Unit: CUTANEOUS | Qty: 1 | Status: AC

## 2018-06-25 NOTE — Progress Notes (Signed)
Nurse went to look at input and output and noticed there were no occurrences. Nurse asked patient if he had make sure NT didn't just forget to document.  Patient states that he has not gone today which is a change from yesterday. Upon bladder scan the largest volume was 437 mls.  Nurse and Jarrett Soho, Rn performed an in and out cath and patient gave 600 ml.  Patient still complains of abdomen pain but was given sennokot and miralax with morning medication and then a suppository around 1400 after working with therapies.

## 2018-06-25 NOTE — Progress Notes (Signed)
PROGRESS NOTE  Brian [name] SHF:[id number] DOB: 11-23-1945 DOA: 06/04/2018 PCP: [name], MD  Brief History   73 year old man found to have metastatic lung cancer, pathologic invasion of L4, underwent L4 corpectomy with reconstruction and posterior pedicle screws L3-L5.  Developed a rash over his back and hospitalist consult was requested.  A & P  Rash over the back and flank/hips, symmetric.  Nonpruritic.  Nontender.  No lesions. --Per nursing and family this is responding to supportive care and calamine lotion.  The patient is asymptomatic. --Suspect contact dermatitis and will continue current treatment with calamine.  Hypokalemia --Replete  Normocytic anemia, anemia of chronic disease --Appears stable.  No indication for transfusion today.  Metastatic squamous cell lung cancer status post lumbar surgery as above. --Per orthopedics   Seems to be improving.  We will follow-up in the morning.  DVT prophylaxis: SCDs Code Status: full Family Communication:  Disposition Plan: per ortho    Murray Hodgkins, MD  [facility] Direct contact: [phone] --Via amion app OR  --[email or web address]; password [email or web address]  7PM-7AM contact night coverage as above 06/25/2018, 12:46 PM  LOS: 21 days   Attending  . Orthopedics  Interval History/Subjective  Patient feels fine, no complaints other than constipation.  He is unable to see rash but denies pruritus.  Nurse reports rash is improving as does family.  Objective   Vitals:  Vitals:   06/25/18 0822 06/25/18 1135  BP:  125/67  Pulse: 95 90  Resp: 16   Temp:  97.9 F (36.6 C)  SpO2: 91% 93%    Exam:  Constitutional:  . Appears calm and comfortable Eyes:  . pupils and irises appear normal ENMT:  . grossly normal hearing  Respiratory:  . CTA bilaterally, no w/r/r.  . Respiratory effort normal.  Cardiovascular:  . RRR, no m/r/g . No LE extremity edema   Abdomen:  . Soft Musculoskeletal:  . RLE, LLE    o strength and tone normal, no atrophy, no abnormal movements Skin:  . Erythematous rash over the back extending down the flank bilateral hips, symmetric, nontender, no lesions Psychiatric:  . Mental status o Mood, affect appropriate   I have personally reviewed the following:   Today's Data  . Potassium 2.8, glucose 147, creatinine within normal limits. . Hemoglobin 8.0 WBC 10.6.  Platelets within normal limits.  Scheduled Meds: . calamine  1 application Topical BID  . feeding supplement (ENSURE ENLIVE)  237 mL Oral BID BM  . fentaNYL  12.5 mcg Transdermal Q72H  . gabapentin  300 mg Oral TID  . guaiFENesin  10 mL Oral Q6H  . ipratropium-albuterol  3 mL Nebulization TID  . pantoprazole (PROTONIX) IV  40 mg Intravenous QHS  . polyethylene glycol  17 g Oral Daily  . potassium chloride  40 mEq Oral BID  . senna  1 tablet Oral Daily  . sodium chloride flush  3 mL Intravenous Q12H   Continuous Infusions: . sodium chloride 250 mL (06/24/18 1521)  . lactated ringers 85 mL/hr at 06/25/18 0845  . methocarbamol (ROBAXIN) IV 500 mg (06/25/18 0716)    Principal Problem:   Lung cancer metastatic to bone [facility]) Active Problems:   Lumbar spine tumor   Bronchitis due to tobacco use   Lung cancer, primary, with metastasis from lung to other site, right ([place])   Squamous cell lung cancer, right ([place])   Palliative care by specialist   Cancer related pain   S/P lumbar fusion   Rash  Hypokalemia   Normocytic anemia   LOS: 21 days

## 2018-06-25 NOTE — Progress Notes (Signed)
Nutrition Follow-up  DOCUMENTATION CODES:   Not applicable  INTERVENTION:  Continue Ensure Enlive po BID, each supplement provides 350 kcal and 20 grams of protein.  Encourage adequate PO intake.   NUTRITION DIAGNOSIS:   Increased nutrient needs related to cancer and cancer related treatments as evidenced by estimated needs; ongoing  GOAL:   Patient will meet greater than or equal to 90% of their needs; progressing  MONITOR:   PO intake, Supplement acceptance  REASON FOR ASSESSMENT:   Rounds    ASSESSMENT:   Pt with PMH of OA, chronic back pain, OA, COPD and RA who was [facility] to [facility] on 12/13 with back pain. Pt found to have presumed stage IV lung cancer due to lumbar spine metastatic lesion s/p fusion.    Procedure(1/2): DECOMPRESSIVE LUMBAR 4-5 LAMINECTOMY   Pt continues on a regular diet with thin liquids and has been tolerating his diet. Pt currently has Ensure ordered and has been consuming them. RD to continue with current orders to aid in caloric and protein needs. Pt encouraged to eat his food at meals and to drink his supplements.   Labs and medications reviewed.   Diet Order:   Diet Order            Diet regular Room service appropriate? Yes; Fluid consistency: Thin  Diet effective now              EDUCATION NEEDS:   Education needs have been addressed  Skin:  Skin Assessment: Reviewed RN Assessment  Last BM:  12/31  Height:   Ht Readings from Last 1 Encounters:  06/24/18 5\' 10"  (1.778 m)    Weight:   Wt Readings from Last 1 Encounters:  06/24/18 93.8 kg    Ideal Body Weight:  75.4 kg  BMI:  Body mass index is 29.67 kg/m.  Estimated Nutritional Needs:   Kcal:  2000-2300  Protein:  105-124 grams  Fluid:  > 2 L/day    Corrin Parker, MS, RD, LDN Pager # [phone] After hours/ weekend pager # [phone]

## 2018-06-25 NOTE — Progress Notes (Signed)
    Subjective: Procedure(s) (LRB): DECOMPRESSIVE LUMBAR 4-5 LAMINECTOMY LEVEL 1 (N/A) 1 Day Post-Op  Patient reports pain as 4 on 0-10 scale.  Reports decreased leg pain reports incisional back pain   Positive void Negative bowel movement Positive flatus Negative chest pain or shortness of breath  Objective: Vital signs in last 24 hours: Temp:  [97.6 F (36.4 C)-99 F (37.2 C)] 98.7 F (37.1 C) (01/03 0750) Pulse Rate:  [83-100] 98 (01/03 0750) Resp:  [14-19] 17 (01/02 2324) BP: (91-125)/(61-81) 125/75 (01/03 0750) SpO2:  [92 %-96 %] 93 % (01/03 0750) Weight:  [93.8 kg] 93.8 kg (01/02 0932)  Intake/Output from previous day: 01/02 0701 - 01/03 0700 In: 816.8 [I.V.:816.8] Out: 801 [Urine:776; Blood:25]  Labs: Recent Labs    06/25/18 0355  WBC 10.6*  RBC 2.77*  HCT 26.0*  PLT 283   Recent Labs    06/25/18 0355  NA 138  K 2.8*  CL 106  CO2 24  BUN 7*  CREATININE 0.71  GLUCOSE 147*  CALCIUM 6.6*   No results for input(s): LABPT, INR in the last 72 hours.  Physical Exam: ABD soft Intact pulses distally Dorsiflexion/Plantar flexion intact Incision: dressing C/D/I Compartment soft Body mass index is 29.67 kg/m. Radicular leg pain is significantly improved since posterior decompression yesterday.  Patient continues to have global deconditioning and weakness in the lower extremities.  Especially in the proximal lower extremity musculature (hip flexor, quads, and strength).  EHL tibialis anterior gastrocnemius are 5/5.  Assessment/Plan: Patient stable  xrays not applicable Continue mobilization with physical therapy Continue care  Overall I am pleased with the patient's improved pain control. At this point time we are going to begin formalized physical therapy again.  If he shows improvement then we will plan on reconsidering the inpatient rehab consult. The dressings are clean dry and intact and the incisions are all healing well. Continue  care.   Melina Schools, MD [facility] [phone]

## 2018-06-25 NOTE — Evaluation (Addendum)
Physical Therapy Re-Evaluation Patient Details Name: Brian [name] MRN: [id number] DOB: 1945/12/05 Today's Date: 06/25/2018   History of Present Illness  73 yo s/p L3-5 PLIF 12/22 after L4 corpectomy 12/20 due to metastic with lesion with primary stage III lung CA commanded by acute respiratory failure intubated from 12/20-12/23.  Underwent posterior decompression L4-5 on 06/24/18.  PMH: emphysema, pathologic L4 fx, lung CA with mets to the bone, RA, abdominal aortic aneurysm  Clinical Impression  Patient presents s/p L4-5 decompression and demonstrates improved LE strength.  Able to stand up to 3 minutes this session prior to c/o lower back fatigue.  Still limited due to scrotal edema with pain during transitions and use of the TLSO.  Feel he will benefit from skilled PT to continue in the acute setting and now even more appropriate for CIR level rehab due to improvement in pain and LE strength.      Follow Up Recommendations CIR;Supervision/Assistance - 24 hour    Equipment Recommendations  None recommended by PT    Recommendations for Other Services       Precautions / Restrictions Precautions Precautions: Back Required Braces or Orthoses: Spinal Brace Spinal Brace: Thoracolumbosacral orthotic;Applied in sitting position      Mobility  Bed Mobility Overal bed mobility: Needs Assistance Bed Mobility: Rolling Rolling: Min assist Sidelying to sit: Min assist;+2 for safety/equipment       General bed mobility comments: assist to guide for technique to roll, assist for guiding feet and lifting trunk  Transfers Overall transfer level: Needs assistance   Transfers: Sit to/from Stand;Stand Pivot Transfers Sit to Stand: +2 physical assistance;From elevated surface;Mod assist Stand pivot transfers: Total assist       General transfer comment: lifting assist to stand from EOB; standing about 3 minutes in stedy and marching in place as well as cues for increased LE weight and  less UE weight, c/o back fatigue so sat on stedy seat and pivot to chair with stedy  Ambulation/Gait                Stairs            Wheelchair Mobility    Modified Rankin (Stroke Patients Only)       Balance Overall balance assessment: Needs assistance   Sitting balance-Leahy Scale: Fair     Standing balance support: Bilateral upper extremity supported Standing balance-Leahy Scale: Poor Standing balance comment: UE support for balance                             Pertinent Vitals/Pain Pain Score: 6  Pain Location: low back and hips Pain Descriptors / Indicators: Aching;Burning Pain Intervention(s): Monitored during session;Repositioned;Premedicated before session    Home Living Family/patient expects to be discharged to:: Inpatient rehab Living Arrangements: Spouse/significant other Available Help at Discharge: Family;Available 24 hours/day Type of Home: House Home Access: Stairs to enter Entrance Stairs-Rails: Right Entrance Stairs-Number of Steps: 6 Home Layout: One level Home Equipment: Walker - 2 wheels;Grab bars - tub/shower;Shower seat;Hand held shower head;Adaptive equipment      Prior Function Level of Independence: Needs assistance   Gait / Transfers Assistance Needed: walker at baseline  ADL's / Homemaking Assistance Needed: sitting with wife helping with back and buttock        Hand Dominance   Dominant Hand: Right    Extremity/Trunk Assessment   Upper Extremity Assessment Upper Extremity Assessment: LUE deficits/detail LUE Deficits / Details: edema  in L forarm, elbow    Lower Extremity Assessment Lower Extremity Assessment: RLE deficits/detail;LLE deficits/detail RLE Deficits / Details: edema in R thigh/knee, strength hip flexion 3-/5, knee extension 4/5, ankle DF 4+.5 RLE Sensation: WNL LLE Deficits / Details: AROM WFL, strength hip flexion 3/5, knee extension 4+/5, ankle DF 4+/5 LLE Sensation: WNL    Cervical  / Trunk Assessment Cervical / Trunk Assessment: Other exceptions Cervical / Trunk Exceptions: back surgery  Communication   Communication: No difficulties  Cognition Arousal/Alertness: Awake/alert Behavior During Therapy: WFL for tasks assessed/performed Overall Cognitive Status: Within Functional Limits for tasks assessed                                        General Comments General comments (skin integrity, edema, etc.): spouse present, encouraged for mobility and to continue attempts even when first attempt to stand was unsuccessful    Exercises     Assessment/Plan    PT Assessment Patient needs continued PT services  PT Problem List Decreased strength;Decreased activity tolerance;Decreased balance;Decreased mobility;Decreased knowledge of precautions;Decreased safety awareness;Decreased knowledge of use of DME       PT Treatment Interventions DME instruction;Therapeutic exercise;Balance training;Gait training;Functional mobility training;Therapeutic activities;Patient/family education    PT Goals (Current goals can be found in the Care Plan section)  Acute Rehab PT Goals Patient Stated Goal: to walk PT Goal Formulation: With patient/family Time For Goal Achievement: 07/09/18 Potential to Achieve Goals: Good    Frequency Min 5X/week   Barriers to discharge        Co-evaluation               AM-PAC PT "6 Clicks" Mobility  Outcome Measure Help needed turning from your back to your side while in a flat bed without using bedrails?: A Little Help needed moving from lying on your back to sitting on the side of a flat bed without using bedrails?: A Little Help needed moving to and from a bed to a chair (including a wheelchair)?: Total Help needed standing up from a chair using your arms (e.g., wheelchair or bedside chair)?: A Lot Help needed to walk in hospital room?: Total Help needed climbing 3-5 steps with a railing? : Total 6 Click Score:  11    End of Session Equipment Utilized During Treatment: Back brace;Oxygen Activity Tolerance: Patient limited by fatigue Patient left: in chair;with call bell/phone within reach;with family/visitor present   PT Visit Diagnosis: Muscle weakness (generalized) (M62.81);Difficulty in walking, not elsewhere classified (R26.2)    Time: 4492-0100 PT Time Calculation (min) (ACUTE ONLY): 27 min   Charges:   PT Evaluation $PT Re-evaluation: 1 Re-eval PT Treatments $Therapeutic Activity: 8-22 mins        Magda Kiel, [place] [phone] 06/25/2018   Reginia Naas 06/25/2018, 12:35 PM

## 2018-06-25 NOTE — Progress Notes (Signed)
Palliative Medicine RN Note: Symptom check.  Pt has not used many PRNs. Spoke with RN before seeing the pt. He asked for acetaminophen for headache but otherwise reports no pain.  Wife and daughter are in the room; pt is sleeping soundly. Mrs Brian [name] reports that since Fentanyl TD was replaced, his pain has been almost completely gone. He is needed Robaxin for muscle spasms, but is otherwise comfortable. He is sleeping now d/t working hard with PT about an hour ago. Both Mrs Brian [name] and her daughter report that they and patient are very, very pleased with current pain relief.  PMT will continue to follow to ensure continued comfort. I expect that due to improvement with Fentanyl patch and his increased ability to work with therapy he will likely not need many more pain medication adjustments. Our team will follow up over the weekend to ensure continued comfort.   Brian Skiff [name], RN, BSN, [facility] Palliative Medicine Team 06/25/2018 1:28 PM Office [phone]

## 2018-06-25 NOTE — Anesthesia Postprocedure Evaluation (Signed)
Anesthesia Post Note  Patient: Brian [name]  Procedure(s) Performed: DECOMPRESSIVE LUMBAR 4-5 LAMINECTOMY LEVEL 1 (N/A )     Patient location during evaluation: PACU Anesthesia Type: General Level of consciousness: sedated Pain management: pain level controlled Vital Signs Assessment: post-procedure vital signs reviewed and stable Respiratory status: spontaneous breathing Cardiovascular status: stable Postop Assessment: no apparent nausea or vomiting Anesthetic complications: no    Last Vitals:  Vitals:   06/25/18 0750 06/25/18 0822  BP: 125/75   Pulse: 98 95  Resp:  16  Temp: 37.1 C   SpO2: 93% 91%    Last Pain:  Vitals:   06/25/18 0750  TempSrc: Oral  PainSc:    Pain Goal: Patients Stated Pain Goal: 3 (06/23/18 0800)               Huston Foley

## 2018-06-25 NOTE — Progress Notes (Signed)
Inpatient Rehabilitation-Admissions Coordinator   Noted CIR consult order fell off. AC will replace IP Rehab Consult Order as means to follow pt.   Please call if questions.   Jhonnie Garner, OTR/L  Rehab Admissions Coordinator  [phone] 06/25/2018 7:56 AM

## 2018-06-26 LAB — BASIC METABOLIC PANEL
ANION GAP: 6 (ref 5–15)
BUN: 6 mg/dL — ABNORMAL LOW (ref 8–23)
CO2: 23 mmol/L (ref 22–32)
Calcium: 6.9 mg/dL — ABNORMAL LOW (ref 8.9–10.3)
Chloride: 108 mmol/L (ref 98–111)
Creatinine, Ser: 0.68 mg/dL (ref 0.61–1.24)
GFR calc Af Amer: >60 mL/min (ref >60)
GFR calc non Af Amer: >60 mL/min (ref >60)
Glucose, Bld: 129 mg/dL — ABNORMAL HIGH (ref 70–99)
Potassium: 3.3 mmol/L — ABNORMAL LOW (ref 3.5–5.1)
Sodium: 137 mmol/L (ref 135–145)

## 2018-06-26 LAB — MAGNESIUM: Magnesium: 1.7 mg/dL (ref 1.7–2.4)

## 2018-06-26 MED ORDER — POTASSIUM CHLORIDE CRYS ER 20 MEQ PO TBCR
40.0000 meq | EXTENDED_RELEASE_TABLET | ORAL | Status: AC
  Administered 2018-06-26 (×2): 40 meq via ORAL
  Filled 2018-06-26 (×2): qty 2

## 2018-06-26 NOTE — Progress Notes (Signed)
Daily Progress Note   Patient Name: Brian [name]       Date: 06/26/2018 DOB: 11-08-1945  Age: 73 y.o. MRN#: [id number] Attending Physician: Melina Schools, MD Primary Care Physician: [name], MD Admit Date: 06/04/2018  Reason for Consultation/Follow-up: Pain control  Subjective: "I'm worn out" Tells me pain is controlled. Muscle spasms continue - robaxin is helpful. BM last night.  Length of Stay: 22  Current Medications: Scheduled Meds:  . calamine  1 application Topical BID  . feeding supplement (ENSURE ENLIVE)  237 mL Oral BID BM  . fentaNYL  12.5 mcg Transdermal Q72H  . gabapentin  300 mg Oral TID  . guaiFENesin  10 mL Oral Q6H  . ipratropium-albuterol  3 mL Nebulization TID  . pantoprazole (PROTONIX) IV  40 mg Intravenous QHS  . polyethylene glycol  17 g Oral Daily  . senna  1 tablet Oral Daily  . sodium chloride flush  3 mL Intravenous Q12H    Continuous Infusions: . sodium chloride 250 mL (06/24/18 1521)  . methocarbamol (ROBAXIN) IV 500 mg (06/25/18 0716)    PRN Meds: acetaminophen **OR** acetaminophen, albuterol, bisacodyl, HYDROmorphone (DILAUDID) injection, HYDROmorphone HCl, menthol-cetylpyridinium **OR** phenol, methocarbamol **OR** methocarbamol (ROBAXIN) IV, ondansetron **OR** ondansetron (ZOFRAN) IV, sodium chloride flush, sodium phosphate  Physical Exam      Vitals signs and nursing note reviewed.  Constitutional:      General: He is awake.  HENT:     Head: Normocephalic and atraumatic.  Cardiovascular:     Rate and Rhythm: Normal rate.  Pulmonary:     Effort: No tachypnea, accessory muscle usage or respiratory distress.  Abdominal:     Tenderness: There is no abdominal tenderness.  Skin:    General: Skin is warm and dry.   Neurological:     Mental  Status: He is alert and oriented to person, place, and time.  Psychiatric:        Mood and Affect: Mood normal.        Speech: Speech normal.        Behavior: Behavior normal.        Cognition and Memory: Cognition normal.      Vital Signs: BP 135/80 (BP Location: Left Arm)   Pulse 94   Temp 99 F (37.2 C) (Oral)   Resp 16   Ht 5\' 10"  (1.778 m)   Wt 93.8 kg   SpO2 (!) 88%   BMI 29.67 kg/m  SpO2: SpO2: (!) 88 % O2 Device: O2 Device: Room Air O2 Flow Rate: O2 Flow Rate (L/min): 2 L/min  Intake/output summary:   Intake/Output Summary (Last 24 hours) at 06/26/2018 1040 Last data filed at 06/26/2018 0700 Gross per 24 hour  Intake 2314.89 ml  Output 1000 ml  Net 1314.89 ml   LBM: Last BM Date: 06/25/18 Baseline Weight: Weight: 89.9 kg Most recent weight: Weight: 93.8 kg       Palliative Assessment/Data: PPS 40%    Flowsheet Rows     Most Recent Value  Intake Tab  Referral Department  -- [Ortho/oncology]  Unit at Time of Referral  Intermediate Care Unit  Palliative Care Primary Diagnosis  Cancer  Date Notified  06/22/18  Palliative Care Type  New Palliative care  Reason for referral  Clarify Goals of Care, Pain  Date of Admission  06/04/18  Date first seen by Palliative Care  06/22/18  # of days IP prior to Palliative referral  18  Clinical Assessment  Palliative Performance Scale Score  40%  Psychosocial & Spiritual Assessment  Palliative Care Outcomes  Patient/Family meeting held?  Yes  Who was at the meeting?  patient and wife  Palliative Care Outcomes  Improved pain interventions, Improved non-pain symptom therapy, Provided psychosocial or spiritual support      Patient Active Problem List   Diagnosis Date Noted  . Rash 06/25/2018  . Hypokalemia 06/25/2018  . Normocytic anemia 06/25/2018  . S/P lumbar fusion 06/24/2018  . Palliative care by specialist   . Cancer related pain   . Lung cancer, primary, with metastasis from lung to other site, right ([place])  06/14/2018  . Squamous cell lung cancer, right ([place]) 06/14/2018  . Lung cancer metastatic to bone ([place]) 06/14/2018  . Bronchitis due to tobacco use 06/07/2018  . Lumbar spine tumor 06/04/2018    Palliative Care Assessment & Plan   HPI: 73 y.o.malewith past medical history of rheumatoid arthritisadmitted on 12/13/2019with low back pain, radicular pain into right buttocks and leg, and rib/left shoulder pain.CT myelogram ordered outpatient and revealed L4 pathologic compression fracture with lytic destruction and extraosseous tumor encroaching into spinal canal and L4-L5 intervertebral foramina. Seen in outpatient ortho office and decision made for direct admission for further workup. 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 Patient s/p LV corpectomy on 12/20 and L3-L5 spinal fusion on 12/22. Pending posterior decompression surgery this week. Oncology following. Interventions are not curable but palliative in nature. Radiation and ? Other oncology options pending clinical condition. Palliative medicine consultation for goals of care/pain management.  Assessment: Follow up with patient this AM - wife at bedside along with several other visitors.  Briefly discussed pain - no PRN dilaudid needed. Family and patient feel fentanyl patch is providing pain relief. Muscle spasms continue - robaxin helpful.   BM last night after dulcolax suppository. Patient required I&O cath last night to void - now reports voiding normally.   Patient needs ongoing goals of care conversations - not attempted at this time as patient had multiple visitors in room. Will ask someone from PMT to follow up early next week. ? D/c to CIR next week?  Recommendations/Plan:  Continue pain management regimen: Fentanyl 12.5 mcg TD, dilaudid 1 mg PO PRN, IV dilaudid PRN for breakthrough, PRN robaxin  Miralax and senna scheduled, prn dulcolax  No oxy per patient/family request  after negative reaction  Will need ongoing goals of care conversations - PMT will follow  Goals of Care and Additional Recommendations:  Limitations on Scope of Treatment: Full Scope Treatment  Code Status:  Full code  Prognosis:   Unable to determine  Discharge Planning:  To Be Determined  Care plan was discussed with patient and family  Thank you for allowing the Palliative Medicine Team to assist in the care of this patient.   Total Time 15 minutes Prolonged Time Billed  no       Greater than 50%  of this time was spent counseling and coordinating care related to the above assessment and plan.  Juel Burrow, DNP, [facility] Palliative Medicine Team Team Phone # [phone]  Pager [phone]

## 2018-06-26 NOTE — Progress Notes (Signed)
    Subjective: Procedure(s) (LRB): DECOMPRESSIVE LUMBAR 4-5 LAMINECTOMY LEVEL 1 (N/A) 2 Days Post-Op  Patient reports pain as 3 on 0-10 scale.  Reports decreased leg pain, but leg is sore reports incisional back pain   Positive void Positive bowel movement Positive flatus Negative chest pain or shortness of breath  Objective: Vital signs in last 24 hours: Temp:  [97.6 F (36.4 C)-99.5 F (37.5 C)] 99 F (37.2 C) (01/04 0700) Pulse Rate:  [90-96] 94 (01/04 0700) Resp:  [16] 16 (01/04 0300) BP: (119-135)/(67-80) 135/80 (01/04 0700) SpO2:  [88 %-95 %] 88 % (01/04 0808)  Intake/Output from previous day: 01/03 0701 - 01/04 0700 In: 2394.9 [P.O.:320; I.V.:1974.9; IV Piggyback:100] Out: 1000 [Urine:1000]  Labs: Recent Labs    06/25/18 0355  WBC 10.6*  RBC 2.77*  HCT 26.0*  PLT 283   Recent Labs    06/25/18 0355  NA 138  K 2.8*  CL 106  CO2 24  BUN 7*  CREATININE 0.71  GLUCOSE 147*  CALCIUM 6.6*   No results for input(s): LABPT, INR in the last 72 hours.  Physical Exam: ABD soft Intact pulses distally Dorsiflexion/Plantar flexion intact Incision: dressing C/D/I Compartment soft Body mass index is 29.67 kg/m. Radicular leg pain is significantly improved since posterior decompression.  Patient continues to have global deconditioning and weakness in the lower extremities.  Especially in the proximal lower extremity musculature (hip flexor, quads, and strength).  EHL tibialis anterior gastrocnemius are 5/5.  Assessment/Plan: Patient stable   Continue mobilization with physical therapy Continue care  He continues to complain of peripheral edema and scrotal edema.  This is painful for him.  We did turn off IV fluids as his last BMP showed appropriate kidney function.  He may be a bit overloaded.  We will follow-up on this morning's BMP.  He is potassium was repleted yesterday we will also follow-up on that.  PT today .  If he shows improvement then we will  plan on reconsidering the inpatient rehab consult. The dressings are clean dry and intact and the incisions are all healing well. Continue care.   Victorino December, MD [facility] [phone]

## 2018-06-26 NOTE — Progress Notes (Signed)
PROGRESS NOTE    Brian [name]   PPJ:[id number]  DOB: 1945-09-15  DOA: 06/04/2018 PCP: [name], MD   Brief Narrative:  Brian [name] 73 year old man found to have metastatic lung cancer, pathologic invasion of L4, underwent L4 corpectomy with reconstruction and posterior pedicle screws L3-L5.  Developed a rash over his back and hospitalist consult was requested.   Subjective: Appears frustrated but has no complaints.   Assessment & Plan: Rash over the back and flank/hips, symmetric.  Nonpruritic.  Nontender.  No lesions. -- suspect contact dermatitis--  calamine lotion started yesterday AM. Per family it has not had enough time to work and rash is only mildly improved - will need to f/u tomorrow and if no improvement, may need to consider change in management  Hypokalemia --Replete again today, Mg normal  Normocytic anemia, anemia of chronic disease --Appears [name].  No indication for transfusion today.  Metastatic squamous cell lung cancer status post lumbar surgery as above. --Per orthopedics   Time spent in minutes: 25 DVT prophylaxis: SCDs Code Status: full code Family Communication: wife and daughter at bedside Disposition Plan: per primary  Antimicrobials:  Anti-infectives (From admission, onward)   Start     Dose/Rate Route Frequency Ordered Stop   06/24/18 2200  vancomycin (VANCOCIN) 1,250 mg in sodium chloride 0.9 % 250 mL IVPB     1,250 mg 166.7 mL/hr over 90 Minutes Intravenous  Once 06/24/18 1444 06/24/18 2341   06/24/18 2100  vancomycin (VANCOCIN) IVPB 1000 mg/200 mL premix  Status:  Discontinued     1,000 mg 200 mL/hr over 60 Minutes Intravenous Every 12 hours 06/24/18 1417 06/24/18 1444   06/24/18 0900  vancomycin (VANCOCIN) IVPB 1000 mg/200 mL premix     1,000 mg 200 mL/hr over 60 Minutes Intravenous To Surgery 06/24/18 0741 06/24/18 1437   06/13/18 2300  vancomycin (VANCOCIN) IVPB 1000 mg/200 mL premix  Status:  Discontinued       1,000 mg 200 mL/hr over 60 Minutes Intravenous Every 12 hours 06/13/18 1516 06/14/18 1734   06/12/18 0000  vancomycin (VANCOCIN) IVPB 1000 mg/200 mL premix  Status:  Discontinued     1,000 mg 200 mL/hr over 60 Minutes Intravenous Every 12 hours 06/11/18 2023 06/13/18 1401   06/11/18 1230  vancomycin (VANCOCIN) 1,500 mg in sodium chloride 0.9 % 500 mL IVPB     1,500 mg 250 mL/hr over 120 Minutes Intravenous  Once 06/11/18 1224 06/11/18 1434       Objective: Vitals:   06/26/18 0300 06/26/18 0700 06/26/18 0808 06/26/18 1100  BP: 119/70 135/80  119/82  Pulse: 95 94    Resp: 16     Temp: 99.5 F (37.5 C) 99 F (37.2 C)  97.8 F (36.6 C)  TempSrc: Axillary Oral  Oral  SpO2: 93%  (!) 88% 94%  Weight:      Height:        Intake/Output Summary (Last 24 hours) at 06/26/2018 1353 Last data filed at 06/26/2018 0700 Gross per 24 hour  Intake 2194.89 ml  Output 1000 ml  Net 1194.89 ml   Filed Weights   06/12/18 0500 06/13/18 0500 06/24/18 0932  Weight: 93 kg 93.8 kg 93.8 kg    Examination: General exam: Appears comfortable  HEENT: PERRLA, oral mucosa moist, no sclera icterus or thrush Respiratory system: Clear to auscultation. Respiratory effort normal. Cardiovascular system: S1 & S2 heard, RRR.   Gastrointestinal system: Abdomen soft, non-tender, nondistended. Normal bowel sounds. Central nervous system:  Alert and oriented. No focal neurological deficits. Extremities: No cyanosis, clubbing or edema Skin: diffuse erythema along all of back,  b/l flanks and sacral area Psychiatry:  Mood & affect appropriate.     Data Reviewed: I have personally reviewed following labs and imaging studies  CBC: Recent Labs  Lab 06/21/18 0759 06/21/18 1920 06/25/18 0355  WBC  --   --  10.6*  HGB 7.0* 9.2* 8.0*  HCT 21.8* 28.2* 26.0*  MCV  --   --  93.9  PLT  --   --  027   Basic Metabolic Panel: Recent Labs  Lab 06/20/18 0726 06/25/18 0355 06/26/18 1034  NA  --  138 137  K  --   2.8* 3.3*  CL  --  106 108  CO2  --  24 23  GLUCOSE  --  147* 129*  BUN  --  7* 6*  CREATININE 0.81 0.71 0.68  CALCIUM  --  6.6* 6.9*  MG  --   --  1.7   GFR: Estimated Creatinine Clearance: 94.6 mL/min (by C-G formula based on SCr of 0.68 mg/dL). Liver Function Tests: No results for input(s): AST, ALT, ALKPHOS, BILITOT, PROT, ALBUMIN in the last 168 hours. No results for input(s): LIPASE, AMYLASE in the last 168 hours. No results for input(s): AMMONIA in the last 168 hours. Coagulation Profile: No results for input(s): INR, PROTIME in the last 168 hours. Cardiac Enzymes: No results for input(s): CKTOTAL, CKMB, CKMBINDEX, TROPONINI in the last 168 hours. BNP (last 3 results) No results for input(s): PROBNP in the last 8760 hours. HbA1C: No results for input(s): HGBA1C in the last 72 hours. CBG: No results for input(s): GLUCAP in the last 168 hours. Lipid Profile: No results for input(s): CHOL, HDL, LDLCALC, TRIG, CHOLHDL, LDLDIRECT in the last 72 hours. Thyroid Function Tests: No results for input(s): TSH, T4TOTAL, FREET4, T3FREE, THYROIDAB in the last 72 hours. Anemia Panel: No results for input(s): VITAMINB12, FOLATE, FERRITIN, TIBC, IRON, RETICCTPCT in the last 72 hours. Urine analysis: No results found for: COLORURINE, APPEARANCEUR, LABSPEC, PHURINE, GLUCOSEU, HGBUR, BILIRUBINUR, KETONESUR, PROTEINUR, UROBILINOGEN, NITRITE, LEUKOCYTESUR Sepsis Labs: @LABRCNTIP (procalcitonin:4,lacticidven:4) )No results found for this or any previous visit (from the past 240 hour(s)).       Radiology Studies: No results found.    Scheduled Meds: . calamine  1 application Topical BID  . feeding supplement (ENSURE ENLIVE)  237 mL Oral BID BM  . fentaNYL  12.5 mcg Transdermal Q72H  . gabapentin  300 mg Oral TID  . guaiFENesin  10 mL Oral Q6H  . pantoprazole (PROTONIX) IV  40 mg Intravenous QHS  . polyethylene glycol  17 g Oral Daily  . senna  1 tablet Oral Daily  . sodium  chloride flush  3 mL Intravenous Q12H   Continuous Infusions: . sodium chloride 250 mL (06/24/18 1521)  . methocarbamol (ROBAXIN) IV 500 mg (06/25/18 0716)     LOS: 22 days      Debbe Odea, MD [facility] Pager: [email or web address] Password [email or web address] 06/26/2018, 1:53 PM

## 2018-06-26 NOTE — Progress Notes (Signed)
Physical Therapy Treatment Patient Details Name: Brian [name] MRN: [id number] DOB: Mar 03, 1946 Today's Date: 06/26/2018    History of Present Illness 73 yo s/p L3-5 PLIF 12/22 after L4 corpectomy 12/20 due to metastic with lesion with primary stage III lung CA commanded by acute respiratory failure intubated from 12/20-12/23.  Underwent posterior decompression L4-5 on 06/24/18.  PMH: emphysema, pathologic L4 fx, lung CA with mets to the bone, RA, abdominal aortic aneurysm    PT Comments    Pt making good progress. Able to take a few steps with walker and assist. Continue to recommend CIR.   Follow Up Recommendations  CIR;Supervision/Assistance - 24 hour     Equipment Recommendations  None recommended by PT    Recommendations for Other Services       Precautions / Restrictions Precautions Precautions: Back Required Braces or Orthoses: Spinal Brace Spinal Brace: Thoracolumbosacral orthotic;Applied in sitting position    Mobility  Bed Mobility Overal bed mobility: Needs Assistance Bed Mobility: Rolling Rolling: Min assist Sidelying to sit: Min assist;+2 for safety/equipment       General bed mobility comments: Assist to elevate trunk into sitting and bring hips to EOB.  Transfers Overall transfer level: Needs assistance Equipment used: Rolling walker (2 wheeled);Ambulation equipment used Transfers: Sit to/from [facility] Sit to Stand: +2 physical assistance;From elevated surface;Mod assist Stand pivot transfers: (Used Stedy for bed to chair)       General transfer comment: Assist to bring hips up and for balance  Ambulation/Gait Ambulation/Gait assistance: Mod assist;+2 safety/equipment Gait Distance (Feet): 6 Feet Assistive device: Rolling walker (2 wheeled) Gait Pattern/deviations: Step-through pattern;Decreased step length - right;Decreased step length - left;Trunk flexed Gait velocity: decr Gait velocity interpretation: <1.31 ft/sec,  indicative of household ambulator General Gait Details: Assist for balance and support. Verbal cues to stand more erect. Close follow with chair.    Stairs             Wheelchair Mobility    Modified Rankin (Stroke Patients Only)       Balance Overall balance assessment: Needs assistance Sitting-balance support: Feet supported Sitting balance-Leahy Scale: Fair     Standing balance support: Bilateral upper extremity supported Standing balance-Leahy Scale: Poor Standing balance comment: walker or stedy and min assist for static standing. Pt stood x 2 in Stedy x 1 minute and performed weight shifting/stepping                            Cognition Arousal/Alertness: Awake/alert Behavior During Therapy: WFL for tasks assessed/performed Overall Cognitive Status: Within Functional Limits for tasks assessed                                        Exercises      General Comments General comments (skin integrity, edema, etc.): wife and daughter present      Pertinent Vitals/Pain Pain Assessment: 0-10 Pain Score: 3  Pain Location: low back and hips, testicles with sitting Pain Descriptors / Indicators: Aching Pain Intervention(s): Limited activity within patient's tolerance;Monitored during session;Premedicated before session    Home Living                      Prior Function            PT Goals (current goals can now be found in the care plan section)  Progress towards PT goals: Progressing toward goals    Frequency    Min 5X/week      PT Plan Current plan remains appropriate    Co-evaluation              AM-PAC PT "6 Clicks" Mobility   Outcome Measure  Help needed turning from your back to your side while in a flat bed without using bedrails?: A Little Help needed moving from lying on your back to sitting on the side of a flat bed without using bedrails?: A Little Help needed moving to and from a bed to a chair  (including a wheelchair)?: Total Help needed standing up from a chair using your arms (e.g., wheelchair or bedside chair)?: A Lot Help needed to walk in hospital room?: A Lot Help needed climbing 3-5 steps with a railing? : Total 6 Click Score: 12    End of Session Equipment Utilized During Treatment: Back brace Activity Tolerance: Patient limited by fatigue Patient left: in chair;with call bell/phone within reach;with family/visitor present;with chair alarm set Nurse Communication: Mobility status PT Visit Diagnosis: Muscle weakness (generalized) (M62.81);Difficulty in walking, not elsewhere classified (R26.2)     Time: 0786-7544 PT Time Calculation (min) (ACUTE ONLY): 25 min  Charges:  $Therapeutic Activity: 23-37 mins                     [place] Pager [phone] Office [place] 06/26/2018, 2:30 PM

## 2018-06-27 LAB — BASIC METABOLIC PANEL
Anion gap: 7 (ref 5–15)
BUN: 5 mg/dL — ABNORMAL LOW (ref 8–23)
CO2: 24 mmol/L (ref 22–32)
Calcium: 7.1 mg/dL — ABNORMAL LOW (ref 8.9–10.3)
Chloride: 104 mmol/L (ref 98–111)
Creatinine, Ser: 0.63 mg/dL (ref 0.61–1.24)
GFR calc Af Amer: >60 mL/min (ref >60)
GLUCOSE: 105 mg/dL — AB (ref 70–99)
Potassium: 3.7 mmol/L (ref 3.5–5.1)
Sodium: 135 mmol/L (ref 135–145)

## 2018-06-27 MED ORDER — CLOBETASOL PROPIONATE 0.05 % EX OINT
TOPICAL_OINTMENT | Freq: Two times a day (BID) | CUTANEOUS | Status: DC
  Administered 2018-06-27 – 2018-06-29 (×5): via TOPICAL
  Filled 2018-06-27: qty 15

## 2018-06-27 MED ORDER — HYDROCODONE-ACETAMINOPHEN 7.5-325 MG PO TABS
1.0000 | ORAL_TABLET | Freq: Three times a day (TID) | ORAL | Status: DC | PRN
  Administered 2018-06-27 – 2018-06-29 (×5): 1 via ORAL
  Filled 2018-06-27 (×5): qty 1

## 2018-06-27 NOTE — Progress Notes (Signed)
Subjective: 3 Days Post-Op Procedure(s) (LRB): DECOMPRESSIVE LUMBAR 4-5 LAMINECTOMY LEVEL 1 (N/A) Patient reports pain as 3 on 0-10 scale.Moves legs well.No further leg pain.CAlcium is 7..1 and HBG 8.0 .He is alert and oriented.    Objective: Vital signs in last 24 hours: Temp:  [97.5 F (36.4 C)-99.4 F (37.4 C)] 99.4 F (37.4 C) (01/05 0300) Pulse Rate:  [95-100] 96 (01/05 0300) Resp:  [18-28] 28 (01/04 2300) BP: (119-143)/(80-83) 143/83 (01/05 0300) SpO2:  [90 %-94 %] 94 % (01/05 0300)  Intake/Output from previous day: 01/04 0701 - 01/05 0700 In: -  Out: 1500 [Urine:1500] Intake/Output this shift: No intake/output data recorded.  Recent Labs    06/25/18 0355  HGB 8.0*   Recent Labs    06/25/18 0355  WBC 10.6*  RBC 2.77*  HCT 26.0*  PLT 283   Recent Labs    06/26/18 1034 06/27/18 0610  NA 137 135  K 3.3* 3.7  CL 108 104  CO2 23 24  BUN 6* 5*  CREATININE 0.68 0.63  GLUCOSE 129* 105*  CALCIUM 6.9* 7.1*   No results for input(s): LABPT, INR in the last 72 hours.  Neurovascular intact  No major changers.  Assessment/Plan: 3 Days Post-Op Procedure(s) (LRB): DECOMPRESSIVE LUMBAR 4-5 LAMINECTOMY LEVEL 1 (N/A) Up with therapy    Latanya Maudlin 06/27/2018, 8:31 AM

## 2018-06-27 NOTE — Progress Notes (Signed)
Consult progress note                                                                              Patient Demographics  Brian [name], is a 73 y.o. male, DOB - 11-30-1945, TDH:[id number]  Admit date - 06/04/2018   Admitting Physician Latanya Maudlin, MD  Outpatient Primary MD for the patient is [name], MD  Outpatient specialists:   LOS - 23  days   Medical records reviewed and are as summarized below:    No chief complaint on file.      Brief summary   Brian [name] 73 year old man found to have metastatic lung cancer, pathologic invasion of L4, underwent L4 corpectomy with reconstruction and posterior pedicle screws L3-L5. Developed a rash over his back and hospitalist consult was requested.   Assessment & Plan    Rash over flank, back, hips, left clavicular area  -Nonpruritic, nontender and no lesions. -Given the appearance of the rash, possibly contact dermatitis -Per family at the bedside, no improvement with calamine lotion, started over 2 days ago -will DC calamine lotion and, placed on high potency steroid ointment with emollients for moisturizing -l change linens to cotton sheets  Normocytic anemia, anemia of chronic disease -H&H stable on 1/3, recheck CBC in a.m.   Metastatic squamous cell lung cancer, status post decompressive lumbar laminectomy L4-L5  -Management per orthopedics  Code Status: Full CODE STATUS DVT Prophylaxis: SCD's Family Communication: Discussed in detail with the patient, all imaging results, lab results explained to the patient, wife and daughter   Disposition Plan:  Time Spent in minutes   35 minutes  Procedures:  Lumbar laminectomy L4-L5  Antimicrobials:      Medications  Scheduled Meds: . clobetasol ointment   Topical BID  . feeding supplement (ENSURE ENLIVE)  237 mL Oral BID BM  . fentaNYL  12.5 mcg Transdermal Q72H  . gabapentin  300 mg Oral TID  . guaiFENesin  10 mL Oral Q6H  .  pantoprazole (PROTONIX) IV  40 mg Intravenous QHS  . polyethylene glycol  17 g Oral Daily  . senna  1 tablet Oral Daily  . sodium chloride flush  3 mL Intravenous Q12H   Continuous Infusions: . sodium chloride 250 mL (06/24/18 1521)  . methocarbamol (ROBAXIN) IV 500 mg (06/25/18 0716)   PRN Meds:.acetaminophen **OR** acetaminophen, albuterol, bisacodyl, HYDROcodone-acetaminophen, HYDROmorphone (DILAUDID) injection, HYDROmorphone HCl, menthol-cetylpyridinium **OR** phenol, methocarbamol **OR** methocarbamol (ROBAXIN) IV, ondansetron **OR** ondansetron (ZOFRAN) IV, sodium chloride flush, sodium phosphate   Antibiotics   Anti-infectives (From admission, onward)   Start     Dose/Rate Route Frequency Ordered Stop   06/24/18 2200  vancomycin (VANCOCIN) 1,250 mg in sodium chloride 0.9 % 250 mL IVPB     1,250 mg 166.7 mL/hr over 90 Minutes Intravenous  Once 06/24/18 1444 06/24/18 2341   06/24/18 2100  vancomycin (VANCOCIN) IVPB 1000 mg/200 mL premix  Status:  Discontinued     1,000 mg 200 mL/hr over 60 Minutes Intravenous Every 12 hours 06/24/18 1417 06/24/18 1444   06/24/18 0900  vancomycin (VANCOCIN) IVPB 1000 mg/200 mL premix     1,000 mg  200 mL/hr over 60 Minutes Intravenous To Surgery 06/24/18 0741 06/24/18 1437   06/13/18 2300  vancomycin (VANCOCIN) IVPB 1000 mg/200 mL premix  Status:  Discontinued     1,000 mg 200 mL/hr over 60 Minutes Intravenous Every 12 hours 06/13/18 1516 06/14/18 1734   06/12/18 0000  vancomycin (VANCOCIN) IVPB 1000 mg/200 mL premix  Status:  Discontinued     1,000 mg 200 mL/hr over 60 Minutes Intravenous Every 12 hours 06/11/18 2023 06/13/18 1401   06/11/18 1230  vancomycin (VANCOCIN) 1,500 mg in sodium chloride 0.9 % 500 mL IVPB     1,500 mg 250 mL/hr over 120 Minutes Intravenous  Once 06/11/18 1224 06/11/18 1434        Subjective:   Brian [name] was seen and examined today.  Patient unfazed with rash, states 'what is a big fuss'.  Still has the  rash on the flank, back and left axillary area, dried skin, no tenderness or lesions..    Objective:   Vitals:   06/26/18 2300 06/27/18 0300 06/27/18 0841 06/27/18 1257  BP: 130/80 (!) 143/83 (!) 141/80 119/80  Pulse: 95 96 96 95  Resp: (!) 28   20  Temp: (!) 97.5 F (36.4 C) 99.4 F (37.4 C) 97.8 F (36.6 C) 98.5 F (36.9 C)  TempSrc: Oral Oral Oral Oral  SpO2: 93% 94% 96% 96%  Weight:      Height:        Intake/Output Summary (Last 24 hours) at 06/27/2018 1314 Last data filed at 06/27/2018 1235 Gross per 24 hour  Intake 360 ml  Output 2176 ml  Net -1816 ml     Wt Readings from Last 3 Encounters:  06/24/18 93.8 kg  05/25/18 92.5 kg     Exam  General: Alert and oriented x 3, NAD  Eyes: PERRLA, EOMI, Anicteric Sclera,  HEENT:  Atraumatic, normocephalic  Cardiovascular: S1 S2 auscultated, Regular rate and rhythm.  Respiratory: Clear to auscultation bilaterally, no wheezing, rales or rhonchi  Gastrointestinal: Soft, nontender, nondistended, + bowel sounds  Ext: no pedal edema bilaterally  Neuro: No new deficit  Musculoskeletal: No digital cyanosis, clubbing  Skin: Erythema along the back, bilateral flanks, left axillary area, dried eczematous skin.  Psych: Normal affect and demeanor, alert and oriented x3    Data Reviewed:  I have personally reviewed following labs and imaging studies  Micro Results No results found for this or any previous visit (from the past 240 hour(s)).  Radiology Reports Dg Lumbar Spine 2-3 Views  Result Date: 06/13/2018 CLINICAL DATA:  Corpectomy and fixation of the lower lumbar spine. EXAM: DG C-ARM 61-120 MIN; LUMBAR SPINE - 2-3 VIEW COMPARISON:  CT 06/11/2018. FINDINGS: Posterior rod fixation with pedicle screws in the L3 and L5 vertebral bodies. L4 corpectomy with a spacer device. IMPRESSION: Posterior lumbar fusion and L4 corpectomy. Electronically Signed   By: Suzy Bouchard M.D.   On: 06/13/2018 13:59   Dg Lumbar Spine  2-3 Views  Result Date: 06/11/2018 CLINICAL DATA:  L4 corpectomy EXAM: LUMBAR SPINE - 2-3 VIEW; DG C-ARM 61-120 MIN COMPARISON:  MRI 06/04/2018 FINDINGS: Five low resolution intraoperative spot views of the lumbar spine. Total fluoroscopy time was 3 minutes 19 seconds. Images were obtained during L4 corpectomy and placement of interbody cage. IMPRESSION: Intraoperative fluoroscopic assistance provided during lumbar spine surgery Electronically Signed   By: Donavan Foil M.D.   On: 06/11/2018 19:07   Ct Chest W Contrast  Result Date: 06/04/2018 CLINICAL DATA:  CLINICAL DATA  Patient with a pathologic fracture in L4 on postmyelogram CT scan 06/02/2018. Evaluate for primary and metastatic disease. EXAM: CT CHEST, ABDOMEN, AND PELVIS WITH CONTRAST TECHNIQUE: Multidetector CT imaging of the chest, abdomen and pelvis was performed following the standard protocol during bolus administration of intravenous contrast. CONTRAST:  100 mL OMNIPAQUE IOHEXOL 300 MG/ML  SOLN COMPARISON:  Postmyelogram lumbar spine CT scan 06/02/2018. FINDINGS: CT CHEST FINDINGS Cardiovascular: Heart size is normal. There is calcific aortic and coronary atherosclerosis. No pericardial effusion. Mediastinum/Nodes: Right hilar lymph node on image 37 measures 1.5 cm short axis dimension. Small node anterior to the right mainstem bronchus measuring 1 cm on image 33 also noted. A few smaller mediastinal lymph nodes are noted. No axillary or supraclavicular lymphadenopathy. Lungs/Pleura: The lungs demonstrate extensive centrilobular emphysematous disease. There is a mass in the right lower lobe which measures 7.3 cm craniocaudal on image 72 of series 7 by 8.1 cm transverse by 5.1 cm AP on image 108 of series 4. Subpleural nodule in the right middle lobe on image 120 measures 0.5 cm Musculoskeletal: A destructive lesion in the medial diaphysis of the left clavicle measures 3 cm long image 20 of series 4 and is approximately 1.4 cm medial to the  clavicular head. A destructive lesion in the lateral arc of the right eighth rib measures 7 cm long by 3.1 cm transverse by 4.2 cm craniocaudal. Subtle lucent lesion lateral arc of the left seventh rib on image 105 of series 4 is also noted. No other focal bony abnormality is identified. A subcutaneous low attenuating lesion in the anterior left chest wall measures 1.8 cm AP x 2.6 cm transverse x 2.5 cm craniocaudal. CT ABDOMEN PELVIS FINDINGS Hepatobiliary: A 2.5 cm in diameter hypoattenuating lesion is seen in the left hepatic lobe near the dome on image 54. A punctate hypoattenuating lesion in the right hepatic lobe on image 70 is also identified. The liver is otherwise unremarkable. A few stones are seen in the gallbladder but there is no evidence of cholecystitis. Biliary tree appears normal. Pancreas: Unremarkable. No pancreatic ductal dilatation or surrounding inflammatory changes. Spleen: Normal in size without focal abnormality. Adrenals/Urinary Tract: Mild thickening of the left adrenal gland is most suggestive of hyperplasia. The right adrenal gland is unremarkable. Stomach/Bowel: Stomach is within normal limits. No evidence of appendicitis. No evidence of bowel wall thickening, distention, or inflammatory changes. Vascular/Lymphatic: The patient has extensive calcific atherosclerosis. Mild aneurysmal dilatation of the descending abdominal aorta at 3.3 cm is identified. No lymphadenopathy. Reproductive: The prostate gland is mildly enlarged. Other: Small fat containing left inguinal hernia is noted. Musculoskeletal: Pathologic fracture of L4 is identified as seen on the comparison CT. No other bony abnormality is identified. Simple lipoma in the right gluteal musculature incidentally noted. IMPRESSION: Right lower lobe mass lesion most consistent with bronchogenic carcinoma. Destructive lesions in the left clavicle, right seventh ribs and L4 vertebral body are consistent with metastatic disease. Small  mediastinal lymph nodes measuring up to 1.5 cm are identified may also be pathologic. Two lesions in the liver are identified and worrisome for metastatic disease. Advanced emphysema. Calcific aortic and coronary atherosclerosis. 3.3 cm abdominal aortic aneurysm is identified. Recommend followup by ultrasound in 3 years. This recommendation follows ACR consensus guidelines: White Paper of the ACR Incidental Findings Committee II on Vascular Findings. J Am Coll Radiol 2013; 10:789-794 Gallstones without evidence of cholecystitis. Cystic lesion in the subcutaneous tissues of the anterior left chest wall is likely a sebaceous cyst. Simple  lipoma right gluteal musculature. Electronically Signed   By: Inge Rise M.D.   On: 06/04/2018 19:31   Ct Lumbar Spine Wo Contrast  Result Date: 06/23/2018 CLINICAL DATA:  Retroperitoneal hematoma follow-up EXAM: CT LUMBAR SPINE WITHOUT CONTRAST TECHNIQUE: Multidetector CT imaging of the lumbar spine was performed without intravenous contrast administration. Multiplanar CT image reconstructions were also generated. COMPARISON:  Lumbar spine MRI 06/18/2018 FINDINGS: Segmentation: 5 lumbar type vertebrae. Alignment: Normal. Vertebrae: Redemonstration of L3-5 posterior spinal fusion with corpectomy at L4. Normal appearance of the hardware. Paraspinal and other soft tissues: There are large right and small left retroperitoneal hematomas expanding the psoas muscles. The right-sided hematoma measures 19.2 by 10.0 x 6.7 cm. The left-sided hematoma measures 11.8 x 4.7 by 4.9 cm. Disc levels: No upper spinal canal stenosis. Assessment of the lower lumbar spinal canal is limited by streak artifact from the hardware. IMPRESSION: 1. Large right and small left retroperitoneal hematomas expanding the psoas muscles. Comparison to the MRI of 06/18/2018 is limited given differences in field of view, but sizes are approximately the same. 2. No acute fracture or static subluxation of the lumbar  spine. The Electronically Signed   By: Ulyses Jarred M.D.   On: 06/23/2018 05:17   Ct Lumbar Spine W Contrast  Result Date: 06/02/2018 CLINICAL DATA:  Low back and bilateral leg pain. EXAM: LUMBAR MYELOGRAM FLUOROSCOPY TIME:  0 minutes 38 seconds. 142.58 micro gray meter squared PROCEDURE: After thorough discussion of risks and benefits of the procedure including bleeding, infection, injury to nerves, blood vessels, adjacent structures as well as headache and CSF leak, written and oral informed consent was obtained. Consent was obtained by Dr. [name]. Time out form was completed. Patient was positioned prone on the fluoroscopy table. Local anesthesia was provided with 1% lidocaine without epinephrine after prepped and draped in the usual sterile fashion. Puncture was performed at L4-5 using a 3 1/2 inch 22-gauge spinal needle via right paramedian approach. Using a single pass through the dura, the needle was placed within the thecal sac, with return of clear CSF. 15 mL of Isovue M-200 was injected into the thecal sac, with normal opacification of the nerve roots and cauda equina consistent with free flow within the subarachnoid space. I personally performed the lumbar puncture and administered the intrathecal contrast. I also personally performed acquisition of the myelogram images. TECHNIQUE: Contiguous axial images were obtained through the Lumbar spine after the intrathecal infusion of infusion. Coronal and sagittal reconstructions were obtained of the axial image sets. COMPARISON:  Radiography 04/13/2018 FINDINGS: LUMBAR MYELOGRAM FINDINGS: There is no compressive central canal stenosis. There is an anterior extradural defect at the L4 level with an abnormal appearance of the posterior L4 vertebral body. There appears to be lytic change of the bone, new since the radiography of October. Extradural defect is present with some canal narrowing and lateral recess encroachment right more than left. L5-S1  shows disc space narrowing. Standing flexion extension views show perhaps lighted crease in prominence of the extradural defect at L4. No antero or retrolisthesis is seen. CT LUMBAR MYELOGRAM FINDINGS: No significant abnormality is seen at L2-3 or above. The vertebral bodies appear normal. The disc spaces are unremarkable. No compressive stenosis. At L4, there is a pathologic compression fracture with lytic destruction. There is loss of height posteriorly of 40%. Extraosseous tumor encroaches upon the spinal canal and both of the L4-5 intervertebral foramina, right more than left. Extraosseous tumor extends from the superior endplate of L4 down  as far as the L4-5 disc. This is consistent with either metastatic carcinoma or myeloma. At L5-S1, there is chronic disc degeneration with vacuum phenomenon and bulging of the disc. There is bilateral facet degeneration. No central canal stenosis. There is bilateral foraminal narrowing right more than left. As shown at radiography, there is an infrarenal abdominal aortic aneurysm with transverse diameter maximal 3.8 cm. There are calcified gallstones within the gallbladder. No other regional finding. IMPRESSION: Pathologic fracture at L4 with maximal loss of height posteriorly of 40%. Lytic destruction of the L4 vertebral body. Extraosseous tumor encroaching upon the spinal canal at the L4 level, worse on the right than the left, with some foraminal encroachment worse on the right than the left at L4-5. This could be metastatic carcinoma or myeloma. Chronic degenerative disc disease at L5-S1 but without significant stenosis. Infrarenal abdominal aortic aneurysm with maximal transverse diameter of 3.8 cm. Recommend followup by ultrasound in 2 years. This recommendation follows ACR consensus guidelines: White Paper of the ACR Incidental Findings Committee II on Vascular Findings. J Am Coll Radiol 2013; 10:789-794. Chololithiasis. These results will be called to the ordering  clinician or representative by the Radiologist Assistant, and communication documented in the PACS or zVision Dashboard. Electronically Signed   By: [name] M.D.   On: 06/02/2018 11:59   Mr Jeri Cos PZ Contrast  Result Date: 06/07/2018 CLINICAL DATA:  Staging small cell lung cancer. EXAM: MRI HEAD WITHOUT AND WITH CONTRAST TECHNIQUE: Multiplanar, multiecho pulse sequences of the brain and surrounding structures were obtained without and with intravenous contrast. CONTRAST:  9 cc Gadavist. COMPARISON:  None. FINDINGS: Brain: Diffusion imaging does not show any acute or subacute infarction. There are a few old small vessel insults within the pons and cerebellum. Cerebral hemispheres show moderate chronic small-vessel ischemic changes of the deep and subcortical white matter without large vessel territory infarction. No evidence of hemorrhage, hydrocephalus or extra-axial collection. After contrast administration, there is no abnormal enhancement to suggest primary or metastatic mass lesion. Vascular: Major vessels at the base of the brain show flow. Skull and upper cervical spine: Negative Sinuses/Orbits: Clear/normal Other: None IMPRESSION: No evidence of metastatic disease. Moderate chronic small-vessel ischemic changes affecting the brain as outlined above. Electronically Signed   By: [name] M.D.   On: 06/07/2018 14:45   Mr Lumbar Spine W Wo Contrast  Result Date: 06/18/2018 CLINICAL DATA:  Status post L4 corpectomy for resection of neoplasm. Increased low back pain. EXAM: MRI LUMBAR SPINE WITHOUT AND WITH CONTRAST TECHNIQUE: Multiplanar and multiecho pulse sequences of the lumbar spine were obtained without and with intravenous contrast. CONTRAST:  9 mL Gadavist COMPARISON:  Lumbar spine MRI 06/04/2018 FINDINGS: Segmentation:  Standard. Alignment:  Physiologic. Vertebrae: Status post L4 corpectomy and L3-L5 fusion. Bone marrow signal at the L4 level is largely obscured by susceptibility  artifacts from the fusion/corpectomy hardware. Conus medullaris and cauda equina: Conus extends to the level. Conus and cauda equina appear normal. Paraspinal and other soft tissues: The right psoas muscle is markedly expanded at the L4 level, with abnormal internal T1-weighted signal. No associated contrast enhancement. There is edema of the left psoas muscle. Disc levels: Severe spinal canal stenosis at the L4 level is unchanged compared to the prior study. The posterior bulging of the vertebral body is unchanged. There is severe right foraminal stenosis. The other disc levels are unchanged. IMPRESSION: 1. Retroperitoneal hematoma expanding the right psoas muscle measuring up to 8.5 x 5.7 cm. 2. Status post  L4 corpectomy and L3-5 fusion with unchanged severe spinal canal stenosis at the L4 level and severe right foraminal narrowing. 3. Magnetic susceptibility effects from the fusion and corpectomy hardware limit assessment of the bone marrow signal at the postoperative levels. These results will be called to the ordering clinician or representative by the Radiologist Assistant, and communication documented in the PACS or zVision Dashboard. Electronically Signed   By: Ulyses Jarred M.D.   On: 06/18/2018 22:21   Mr Lumbar Spine W Wo Contrast  Result Date: 06/04/2018 CLINICAL DATA:  Metastatic disease.  Lung cancer. EXAM: MRI LUMBAR SPINE WITHOUT AND WITH CONTRAST TECHNIQUE: Multiplanar and multiecho pulse sequences of the lumbar spine were obtained without and with intravenous contrast. CONTRAST:  7.5 mL Gadavist COMPARISON:  CT lumbar spine 06/02/2018 FINDINGS: Segmentation: Normal. The lowest disc space is considered to be L5-S1. Alignment:  Normal Vertebrae: There is loss of normal signal throughout the L4 vertebral body, where there is a known pathologic fracture with approximately 40% height loss. There is posterior convex bulging with multifocal contrast-enhancement. There is thickening of the overlying  ventral dura with associated increased contrast enhancement. This results in severe spinal canal stenosis at this level. There are no other focal osseous lesions. Conus medullaris and cauda equina: The conus medullaris terminates at the L1 level. The cauda equina and conus medullaris are both normal. Paraspinal and other soft tissues: The visualized aorta, IVC and iliac vessels are normal. The visualized retroperitoneal organs and paraspinal soft tissues are normal. Disc levels: Sagittal plane imaging includes the T11-12 disc level through the upper sacrum, with axial imaging of the T12-L1 to L5-S1 disc levels. There is disc desiccation at the T12-L1, L1-L2 and L2-L3 levels without spinal canal stenosis or neural impingement. L3-4: Small disc bulge without associated stenosis. At the L4 level, there is severe spinal canal stenosis due to posterior bulging of the L4 vertebral body. L4-5: Minimal disc bulge. There is severe right neural foraminal stenosis due to the L4 mass. Mild left foraminal stenosis. L5-S1: Disc desiccation and mild endplate spurring. Moderate right neural foraminal stenosis. The visualized portion of the sacrum is normal. IMPRESSION: 1. Metastatic lesion of the L4 vertebral body with associated pathologic fracture and posterior bulging of the vertebral body. This, in combination with overlying epidural thickening, causes severe spinal canal stenosis and severe right neural foraminal stenosis. 2. Moderate right L5-S1 neural foraminal stenosis. Electronically Signed   By: Ulyses Jarred M.D.   On: 06/04/2018 22:20   Nm Bone Scan Whole Body  Result Date: 06/07/2018 CLINICAL DATA:  Metastatic spine tumor. EXAM: NUCLEAR MEDICINE WHOLE BODY BONE SCAN TECHNIQUE: Whole body anterior and posterior images were obtained approximately 3 hours after intravenous injection of radiopharmaceutical. RADIOPHARMACEUTICALS:  20.2 mCi Technetium-74m MDP IV COMPARISON:  MRI of June 04, 2018. CT scan of June 04, 2018. FINDINGS: Abnormal uptake is seen in left knee consistent with degenerative change. Abnormal uptake is seen at L4 level consistent with metastatic lesion seen on prior MRI. Focus of abnormal uptake is seen involving the posterior portion of a right rib which is consistent with metastatic disease. Abnormal uptake is seen involving the anterior portions of the left first rib and left clavicle consistent with metastatic disease as described on prior CT scan. IMPRESSION: Abnormal uptake is seen in midportion of right rib, L4 vertebral body and anterior portions of left first rib and left clavicle consistent with metastatic disease. This correlates with findings seen on prior CT scan. Electronically Signed  By: Marijo Conception, M.D.   On: 06/07/2018 13:43   Ct Abdomen Pelvis W Contrast  Result Date: 06/04/2018 CLINICAL DATA:  CLINICAL DATA Patient with a pathologic fracture in L4 on postmyelogram CT scan 06/02/2018. Evaluate for primary and metastatic disease. EXAM: CT CHEST, ABDOMEN, AND PELVIS WITH CONTRAST TECHNIQUE: Multidetector CT imaging of the chest, abdomen and pelvis was performed following the standard protocol during bolus administration of intravenous contrast. CONTRAST:  100 mL OMNIPAQUE IOHEXOL 300 MG/ML  SOLN COMPARISON:  Postmyelogram lumbar spine CT scan 06/02/2018. FINDINGS: CT CHEST FINDINGS Cardiovascular: Heart size is normal. There is calcific aortic and coronary atherosclerosis. No pericardial effusion. Mediastinum/Nodes: Right hilar lymph node on image 37 measures 1.5 cm short axis dimension. Small node anterior to the right mainstem bronchus measuring 1 cm on image 33 also noted. A few smaller mediastinal lymph nodes are noted. No axillary or supraclavicular lymphadenopathy. Lungs/Pleura: The lungs demonstrate extensive centrilobular emphysematous disease. There is a mass in the right lower lobe which measures 7.3 cm craniocaudal on image 72 of series 7 by 8.1 cm transverse by  5.1 cm AP on image 108 of series 4. Subpleural nodule in the right middle lobe on image 120 measures 0.5 cm Musculoskeletal: A destructive lesion in the medial diaphysis of the left clavicle measures 3 cm long image 20 of series 4 and is approximately 1.4 cm medial to the clavicular head. A destructive lesion in the lateral arc of the right eighth rib measures 7 cm long by 3.1 cm transverse by 4.2 cm craniocaudal. Subtle lucent lesion lateral arc of the left seventh rib on image 105 of series 4 is also noted. No other focal bony abnormality is identified. A subcutaneous low attenuating lesion in the anterior left chest wall measures 1.8 cm AP x 2.6 cm transverse x 2.5 cm craniocaudal. CT ABDOMEN PELVIS FINDINGS Hepatobiliary: A 2.5 cm in diameter hypoattenuating lesion is seen in the left hepatic lobe near the dome on image 54. A punctate hypoattenuating lesion in the right hepatic lobe on image 70 is also identified. The liver is otherwise unremarkable. A few stones are seen in the gallbladder but there is no evidence of cholecystitis. Biliary tree appears normal. Pancreas: Unremarkable. No pancreatic ductal dilatation or surrounding inflammatory changes. Spleen: Normal in size without focal abnormality. Adrenals/Urinary Tract: Mild thickening of the left adrenal gland is most suggestive of hyperplasia. The right adrenal gland is unremarkable. Stomach/Bowel: Stomach is within normal limits. No evidence of appendicitis. No evidence of bowel wall thickening, distention, or inflammatory changes. Vascular/Lymphatic: The patient has extensive calcific atherosclerosis. Mild aneurysmal dilatation of the descending abdominal aorta at 3.3 cm is identified. No lymphadenopathy. Reproductive: The prostate gland is mildly enlarged. Other: Small fat containing left inguinal hernia is noted. Musculoskeletal: Pathologic fracture of L4 is identified as seen on the comparison CT. No other bony abnormality is identified. Simple  lipoma in the right gluteal musculature incidentally noted. IMPRESSION: Right lower lobe mass lesion most consistent with bronchogenic carcinoma. Destructive lesions in the left clavicle, right seventh ribs and L4 vertebral body are consistent with metastatic disease. Small mediastinal lymph nodes measuring up to 1.5 cm are identified may also be pathologic. Two lesions in the liver are identified and worrisome for metastatic disease. Advanced emphysema. Calcific aortic and coronary atherosclerosis. 3.3 cm abdominal aortic aneurysm is identified. Recommend followup by ultrasound in 3 years. This recommendation follows ACR consensus guidelines: White Paper of the ACR Incidental Findings Committee II on Vascular Findings. J Am  Coll Radiol 2013; 10:789-794 Gallstones without evidence of cholecystitis. Cystic lesion in the subcutaneous tissues of the anterior left chest wall is likely a sebaceous cyst. Simple lipoma right gluteal musculature. Electronically Signed   By: Inge Rise M.D.   On: 06/04/2018 19:31   Dg Lumbar Spine 1 View  Result Date: 06/24/2018 CLINICAL DATA:  Decompressive laminectomy L4-5 EXAM: LUMBAR SPINE - 1 VIEW COMPARISON:  None. FINDINGS: Changes of corpectomy at L4 and posterior fusion from L3-L5. Posterior needles are directed at the L4 corpectomy level. IMPRESSION: Intraoperative localization as above. Electronically Signed   By: Rolm Baptise M.D.   On: 06/24/2018 12:06   Dg Chest Port 1 View  Result Date: 06/13/2018 CLINICAL DATA:  ETT, Respiratory failure, non smoker. EXAM: PORTABLE CHEST 1 VIEW COMPARISON:  None. FINDINGS: Endotracheal tube and NG tube in good position. Linear density in the LEFT lower lobe unchanged. Persistent low lung volumes. Upper lungs clear. IMPRESSION: 1. No interval change.  Stable support apparatus. 2. Atelectasis in the LEFT lower lobe. Electronically Signed   By: Suzy Bouchard M.D.   On: 06/13/2018 07:32   Dg Chest Port 1 View  Result Date:  06/12/2018 CLINICAL DATA:  Right lower lobe lung mass. EXAM: PORTABLE CHEST 1 VIEW COMPARISON:  06/11/2018 FINDINGS: 1340 hours. Endotracheal tube tip is 7.2 cm above the base of the carina. The NG tube passes into the stomach although the distal tip position is not included on the film. Interval increase in left base atelectasis. Right lower lobe pulmonary mass again noted. Interstitial markings are diffusely coarsened with chronic features. Probable tiny pleural effusions. No worrisome lytic or sclerotic osseous abnormality. Telemetry leads overlie the chest. IMPRESSION: New left lower lobe atelectasis with potential small bilateral effusions. Right lower lobe pulmonary mass. Chronic underlying interstitial lung disease. Electronically Signed   By: Misty Stanley M.D.   On: 06/12/2018 14:18   Dg Chest Port 1 View  Result Date: 06/11/2018 CLINICAL DATA:  Intubated. EXAM: PORTABLE CHEST 1 VIEW COMPARISON:  Chest CT 06/04/2018. FINDINGS: 2036 hours. Endotracheal tube tip is 8.2 cm above the base of the carina. Heart size upper normal. Interstitial markings are diffusely coarsened with chronic features. Known right lower lobe mass projects over the right hilum. There is right base atelectasis or infiltrate. IMPRESSION: 1. Endotracheal tube tip is 8.2 cm above the base the carina. 2. Known right lower lobe mass projects over the right hilum. 3. Chronic interstitial lung disease. Atelectasis or infiltrate noted at the right base. Electronically Signed   By: Misty Stanley M.D.   On: 06/11/2018 20:50   Dg Spine Portable 1 View  Result Date: 06/24/2018 CLINICAL DATA:  Tumor of L4 vertebral body. EXAM: PORTABLE SPINE - 1 VIEW COMPARISON:  MRI dated 06/18/2018 and CT scan dated 06/23/2018 FINDINGS: Lateral view of the lumbar spine demonstrates an instrument under the lamina of L4. Previous L4 partial corpectomy and posterior fusion from L3-L5. IMPRESSION: Instrument under the lamina of L4. Electronically Signed   By:  Lorriane Shire M.D.   On: 06/24/2018 11:20   Dg Abd Portable 1v  Result Date: 06/11/2018 CLINICAL DATA:  Orogastric tube placement. EXAM: PORTABLE ABDOMEN - 1 VIEW COMPARISON:  CT of earlier today FINDINGS: Nasogastric terminates at the body of the stomach. Bibasilar airspace disease, likely atelectasis. No gross free intraperitoneal air or bowel obstruction identified. IMPRESSION: Nasogastric terminating at the body of the stomach. Electronically Signed   By: Abigail Miyamoto M.D.   On: 06/11/2018 23:36  Dg C-arm 1-60 Min  Result Date: 06/13/2018 CLINICAL DATA:  Corpectomy and fixation of the lower lumbar spine. EXAM: DG C-ARM 61-120 MIN; LUMBAR SPINE - 2-3 VIEW COMPARISON:  CT 06/11/2018. FINDINGS: Posterior rod fixation with pedicle screws in the L3 and L5 vertebral bodies. L4 corpectomy with a spacer device. IMPRESSION: Posterior lumbar fusion and L4 corpectomy. Electronically Signed   By: Suzy Bouchard M.D.   On: 06/13/2018 13:59   Dg C-arm 1-60 Min  Result Date: 06/11/2018 CLINICAL DATA:  L4 corpectomy EXAM: LUMBAR SPINE - 2-3 VIEW; DG C-ARM 61-120 MIN COMPARISON:  MRI 06/04/2018 FINDINGS: Five low resolution intraoperative spot views of the lumbar spine. Total fluoroscopy time was 3 minutes 19 seconds. Images were obtained during L4 corpectomy and placement of interbody cage. IMPRESSION: Intraoperative fluoroscopic assistance provided during lumbar spine surgery Electronically Signed   By: Donavan Foil M.D.   On: 06/11/2018 19:07   Dg Myelography Lumbar Inj Lumbosacral  Result Date: 06/02/2018 CLINICAL DATA:  Low back and bilateral leg pain. EXAM: LUMBAR MYELOGRAM FLUOROSCOPY TIME:  0 minutes 38 seconds. 142.58 micro gray meter squared PROCEDURE: After thorough discussion of risks and benefits of the procedure including bleeding, infection, injury to nerves, blood vessels, adjacent structures as well as headache and CSF leak, written and oral informed consent was obtained. Consent was  obtained by Dr. [name]. Time out form was completed. Patient was positioned prone on the fluoroscopy table. Local anesthesia was provided with 1% lidocaine without epinephrine after prepped and draped in the usual sterile fashion. Puncture was performed at L4-5 using a 3 1/2 inch 22-gauge spinal needle via right paramedian approach. Using a single pass through the dura, the needle was placed within the thecal sac, with return of clear CSF. 15 mL of Isovue M-200 was injected into the thecal sac, with normal opacification of the nerve roots and cauda equina consistent with free flow within the subarachnoid space. I personally performed the lumbar puncture and administered the intrathecal contrast. I also personally performed acquisition of the myelogram images. TECHNIQUE: Contiguous axial images were obtained through the Lumbar spine after the intrathecal infusion of infusion. Coronal and sagittal reconstructions were obtained of the axial image sets. COMPARISON:  Radiography 04/13/2018 FINDINGS: LUMBAR MYELOGRAM FINDINGS: There is no compressive central canal stenosis. There is an anterior extradural defect at the L4 level with an abnormal appearance of the posterior L4 vertebral body. There appears to be lytic change of the bone, new since the radiography of October. Extradural defect is present with some canal narrowing and lateral recess encroachment right more than left. L5-S1 shows disc space narrowing. Standing flexion extension views show perhaps lighted crease in prominence of the extradural defect at L4. No antero or retrolisthesis is seen. CT LUMBAR MYELOGRAM FINDINGS: No significant abnormality is seen at L2-3 or above. The vertebral bodies appear normal. The disc spaces are unremarkable. No compressive stenosis. At L4, there is a pathologic compression fracture with lytic destruction. There is loss of height posteriorly of 40%. Extraosseous tumor encroaches upon the spinal canal and both of the L4-5  intervertebral foramina, right more than left. Extraosseous tumor extends from the superior endplate of L4 down as far as the L4-5 disc. This is consistent with either metastatic carcinoma or myeloma. At L5-S1, there is chronic disc degeneration with vacuum phenomenon and bulging of the disc. There is bilateral facet degeneration. No central canal stenosis. There is bilateral foraminal narrowing right more than left. As shown at radiography, there is an  infrarenal abdominal aortic aneurysm with transverse diameter maximal 3.8 cm. There are calcified gallstones within the gallbladder. No other regional finding. IMPRESSION: Pathologic fracture at L4 with maximal loss of height posteriorly of 40%. Lytic destruction of the L4 vertebral body. Extraosseous tumor encroaching upon the spinal canal at the L4 level, worse on the right than the left, with some foraminal encroachment worse on the right than the left at L4-5. This could be metastatic carcinoma or myeloma. Chronic degenerative disc disease at L5-S1 but without significant stenosis. Infrarenal abdominal aortic aneurysm with maximal transverse diameter of 3.8 cm. Recommend followup by ultrasound in 2 years. This recommendation follows ACR consensus guidelines: White Paper of the ACR Incidental Findings Committee II on Vascular Findings. J Am Coll Radiol 2013; 10:789-794. Chololithiasis. These results will be called to the ordering clinician or representative by the Radiologist Assistant, and communication documented in the PACS or zVision Dashboard. Electronically Signed   By: [name] M.D.   On: 06/02/2018 11:59   Ct Angio Abd/pel W/ And/or W/o  Result Date: 06/11/2018 CLINICAL DATA:  73 year old male with concern for postoperative bleeding during corpectomy L4. History of known metastatic disease, bronchogenic carcinoma EXAM: CT ANGIOGRAPHY ABDOMEN AND PELVIS WITH CONTRAST AND WITHOUT CONTRAST TECHNIQUE: Multidetector CT imaging of the abdomen and pelvis  was performed using the standard protocol during bolus administration of intravenous contrast. Multiplanar reconstructed images and MIPs were obtained and reviewed to evaluate the vascular anatomy. CONTRAST:  181mL ISOVUE-370 IOPAMIDOL (ISOVUE-370) INJECTION 76% COMPARISON:  CT 06/04/2018, 06/02/2018 FINDINGS: VASCULAR Aorta: Mild atherosclerotic changes of the abdominal aorta. Infrarenal abdominal aortic aneurysm with the largest diameter measuring 3.5 cm near the IMA origin. No periaortic fluid. No dissection. Median sacral artery is of small caliber anterior to the sacrum, does not appear to contribute to hematoma of the retroperitoneum. Separate origin of bilateral L5 lumbar arteries. Contrast staining of the right paravertebral soft tissues/retroperitoneum/psoas muscle just lateral to the corpectomy site. Hematoma at this site measures 22 mm in AP diameter just lateral to the corpectomy hardware in the region of the tumor resection/treatment. Contrast staining within the psoas musculature, with the largest focus 10 mm. Celiac: Celiac artery patent with no significant atherosclerotic changes at the origin. Branches are patent. Replaced left hepatic artery. SMA: SMA patent with mild atherosclerotic changes at the origin, no significant stenosis. Branches are patent. Renals: Single right renal artery with no significant atherosclerotic changes at the origin. The main left renal artery originates from the 3 o'clock position with minimal atherosclerotic changes. There is an accessory left renal artery from the 1 o'clock position to the superior pole. IMA: IMA patent Right lower extremity: Tortuous right iliac system. Mild atherosclerotic changes of the iliac system. Hypogastric artery is patent. External iliac artery patent. Common femoral artery patent with mild atherosclerotic changes. Proximal SFA and profunda femoris patent. Left lower extremity: Tortuous left iliac system. Mild atherosclerotic changes.  Hypogastric artery is patent. External iliac artery patent. Mild atherosclerotic changes of the common femoral artery. Profunda femoris and SFA patent. Veins: Unremarkable appearance of the venous system. Review of the MIP images confirms the above findings. NON-VASCULAR Lower chest: Redemonstration of known right lower lobe mass with associated atelectasis/consolidation, incompletely imaged. Atelectasis at the dependent aspects of the left lower lobe. Redemonstration of right chest wall metastatic lesion centered within the angle of the right eighth rib. Greatest diameter on axial images measures 2.9 cm x 7.0 cm, relatively unchanged from the comparison CT. Hepatobiliary: Redemonstration of segment 2 metastatic  lesion with heterogeneous enhancement measures 3.2 cm, slightly larger than the comparison CT, though potentially measurement difference secondary to technique. Questionable small lesion in the lateral right liver measures 4 mm-5 mm. Heterogeneous enhancement in the inferior aspects of the right liver of uncertain significance. Calcified stones within the gallbladder. Pancreas: Unremarkable pancreas Spleen: Unremarkable spleen Adrenals/Urinary Tract: Unremarkable adrenal glands Right: No hydronephrosis. Symmetric perfusion to the left. No nephrolithiasis. Unremarkable course of the right ureter. Left: No hydronephrosis. Symmetric perfusion to the right. No nephrolithiasis. Unremarkable course of the left ureter. Balloon retention catheter in the urinary bladder. Air-fluid level within the urinary bladder, likely secondary to recent instrumentation. Stomach/Bowel: Distal esophagus is circumferentially thickened with hyperenhancement of the mucosal surface. Unremarkable stomach. Small bowel decompressed. No transition point or evidence of obstruction. Normal appendix colonic diverticula without evidence of acute inflammatory changes. Lymphatic: No lymphadenopathy Mesenteric: Postsurgical changes of the left  abdominal wall with surgical drain from the surgical site of L4 traversing the left psoas and the left retroperitoneum and left abdominal wall. Minimal gas within the right psoas and left psoas as well as the left retroperitoneum. Reproductive: Transverse prostate measures 5.4 cm Other: None Musculoskeletal: Postsurgical changes of L4 corpectomy with hardware placement centered at the vertebral body site. Metallic streak artifact somewhat obscures details at this site. Relatively unchanged appearance of soft tissue posterior to the surgical site at the anterior aspect of the canal, seen on prior myelogram to narrow the canal with. The hematoma on the right aspect of the vertebral body appears lateral and anterior to the canal. IMPRESSION: Interval postsurgical changes of L4 corpectomy with cage placement, and surgical drain from the left surgical site through the left retroperitoneum. Small hematoma in the retroperitoneum at the right lateral aspect of the spine extending into the psoas muscle, and in the setting of recent surgery could be venous or arterial. Similar appearance of soft tissue at the anterior aspect of the spinal canal at the surgical site, partially obscured by streak artifact on the current CT, and better characterized on the prior. Redemonstration of right right lower lobe lung mass and metastatic disease involving liver, right eighth rib/chest wall. Heterogeneous appearance of the right liver lobe, segment 6, on the venous phase could represent additional small metastatic lesions versus heterogeneous attenuation/perfusion. Circumferential esophageal thickening in the lower thoracic esophagus. Recommend correlation with history of GERD/esophagitis. Infrarenal abdominal aortic aneurysm measuring 3.5 cm. Aortic aneurysm NOS (ICD10-I71.9). Aortic Atherosclerosis (ICD10-I70.0). Electronically Signed   By: Corrie Mckusick D.O.   On: 06/11/2018 21:59    Lab Data:  CBC: Recent Labs  Lab  06/21/18 0759 06/21/18 1920 06/25/18 0355  WBC  --   --  10.6*  HGB 7.0* 9.2* 8.0*  HCT 21.8* 28.2* 26.0*  MCV  --   --  93.9  PLT  --   --  161   Basic Metabolic Panel: Recent Labs  Lab 06/25/18 0355 06/26/18 1034 06/27/18 0610  NA 138 137 135  K 2.8* 3.3* 3.7  CL 106 108 104  CO2 24 23 24   GLUCOSE 147* 129* 105*  BUN 7* 6* 5*  CREATININE 0.71 0.68 0.63  CALCIUM 6.6* 6.9* 7.1*  MG  --  1.7  --    GFR: Estimated Creatinine Clearance: 94.6 mL/min (by C-G formula based on SCr of 0.63 mg/dL). Liver Function Tests: No results for input(s): AST, ALT, ALKPHOS, BILITOT, PROT, ALBUMIN in the last 168 hours. No results for input(s): LIPASE, AMYLASE in the last 168 hours. No  results for input(s): AMMONIA in the last 168 hours. Coagulation Profile: No results for input(s): INR, PROTIME in the last 168 hours. Cardiac Enzymes: No results for input(s): CKTOTAL, CKMB, CKMBINDEX, TROPONINI in the last 168 hours. BNP (last 3 results) No results for input(s): PROBNP in the last 8760 hours. HbA1C: No results for input(s): HGBA1C in the last 72 hours. CBG: No results for input(s): GLUCAP in the last 168 hours. Lipid Profile: No results for input(s): CHOL, HDL, LDLCALC, TRIG, CHOLHDL, LDLDIRECT in the last 72 hours. Thyroid Function Tests: No results for input(s): TSH, T4TOTAL, FREET4, T3FREE, THYROIDAB in the last 72 hours. Anemia Panel: No results for input(s): VITAMINB12, FOLATE, FERRITIN, TIBC, IRON, RETICCTPCT in the last 72 hours. Urine analysis: No results found for: COLORURINE, APPEARANCEUR, LABSPEC, PHURINE, GLUCOSEU, HGBUR, BILIRUBINUR, KETONESUR, PROTEINUR, UROBILINOGEN, NITRITE, LEUKOCYTESUR   Tyreque Finken M.D. [facility] 06/27/2018, 1:14 PM  Pager: [phone] Between 7am to 7pm - call Pager - [phone]  After 7pm go to [email or web address] - password [email or web address]  Call night coverage person covering after 7pm

## 2018-06-28 DIAGNOSIS — C3490 Malignant neoplasm of unspecified part of unspecified bronchus or lung: Secondary | ICD-10-CM | POA: Diagnosis present

## 2018-06-28 MED ORDER — PANTOPRAZOLE SODIUM 40 MG PO TBEC
40.0000 mg | DELAYED_RELEASE_TABLET | Freq: Every day | ORAL | Status: DC
  Administered 2018-06-28: 40 mg via ORAL
  Filled 2018-06-28: qty 1

## 2018-06-28 NOTE — Care Management Important Message (Signed)
Important Message  Patient Details  Name: Brian [name] MRN: [id number] Date of Birth: 25-Oct-1945   Medicare Important Message Given:  Yes    Orbie Pyo 06/28/2018, 4:03 PM

## 2018-06-28 NOTE — Plan of Care (Signed)
  Problem: Education: Goal: Knowledge of General Education information will improve Description Including pain rating scale, medication(s)/side effects and non-pharmacologic comfort measures Outcome: Progressing   Problem: Health Behavior/Discharge Planning: Goal: Ability to manage health-related needs will improve Outcome: Progressing   Problem: Clinical Measurements: Goal: Ability to maintain clinical measurements within normal limits will improve Outcome: Progressing Goal: Will remain free from infection Outcome: Progressing Goal: Diagnostic test results will improve Outcome: Progressing Goal: Respiratory complications will improve Outcome: Progressing Goal: Cardiovascular complication will be avoided Outcome: Progressing   Problem: Activity: Goal: Risk for activity intolerance will decrease Outcome: Progressing   Problem: Nutrition: Goal: Adequate nutrition will be maintained Outcome: Progressing   Problem: Coping: Goal: Level of anxiety will decrease Outcome: Progressing   Problem: Elimination: Goal: Will not experience complications related to bowel motility Outcome: Progressing Goal: Will not experience complications related to urinary retention Outcome: Progressing   Problem: Pain Managment: Goal: General experience of comfort will improve Outcome: Progressing   Problem: Safety: Goal: Ability to remain free from injury will improve Outcome: Progressing   Problem: Skin Integrity: Goal: Risk for impaired skin integrity will decrease Outcome: Progressing   Problem: Education: Goal: Ability to verbalize activity precautions or restrictions will improve Outcome: Progressing Goal: Knowledge of the prescribed therapeutic regimen will improve Outcome: Progressing Goal: Understanding of discharge needs will improve Outcome: Progressing   Problem: Activity: Goal: Ability to avoid complications of mobility impairment will improve Outcome: Progressing Goal:  Ability to tolerate increased activity will improve Outcome: Progressing Goal: Will remain free from falls Outcome: Progressing   Problem: Bowel/Gastric: Goal: Gastrointestinal status for postoperative course will improve Outcome: Progressing   Problem: Clinical Measurements: Goal: Ability to maintain clinical measurements within normal limits will improve Outcome: Progressing Goal: Postoperative complications will be avoided or minimized Outcome: Progressing Goal: Diagnostic test results will improve Outcome: Progressing   Problem: Pain Management: Goal: Pain level will decrease Outcome: Progressing   Problem: Skin Integrity: Goal: Will show signs of wound healing Outcome: Progressing   Problem: Health Behavior/Discharge Planning: Goal: Identification of resources available to assist in meeting health care needs will improve Outcome: Progressing   Problem: Bladder/Genitourinary: Goal: Urinary functional status for postoperative course will improve Outcome: Progressing   Problem: Education: Goal: Knowledge of General Education information will improve Description Including pain rating scale, medication(s)/side effects and non-pharmacologic comfort measures Outcome: Progressing   Problem: Health Behavior/Discharge Planning: Goal: Ability to manage health-related needs will improve Outcome: Progressing   Problem: Clinical Measurements: Goal: Ability to maintain clinical measurements within normal limits will improve Outcome: Progressing Goal: Will remain free from infection Outcome: Progressing Goal: Diagnostic test results will improve Outcome: Progressing Goal: Respiratory complications will improve Outcome: Progressing Goal: Cardiovascular complication will be avoided Outcome: Progressing   Problem: Activity: Goal: Risk for activity intolerance will decrease Outcome: Progressing   Problem: Nutrition: Goal: Adequate nutrition will be maintained Outcome:  Progressing   Problem: Coping: Goal: Level of anxiety will decrease Outcome: Progressing   Problem: Elimination: Goal: Will not experience complications related to bowel motility Outcome: Progressing Goal: Will not experience complications related to urinary retention Outcome: Progressing   Problem: Pain Managment: Goal: General experience of comfort will improve Outcome: Progressing   Problem: Safety: Goal: Ability to remain free from injury will improve Outcome: Progressing   Problem: Skin Integrity: Goal: Risk for impaired skin integrity will decrease Outcome: Progressing

## 2018-06-28 NOTE — Progress Notes (Signed)
    Subjective: Procedure(s) (LRB): DECOMPRESSIVE LUMBAR 4-5 LAMINECTOMY LEVEL 1 (N/A) 4 Days Post-Op  Patient reports pain as 4 on 0-10 scale.  Reports decreased leg pain reports incisional back pain   Positive void Positive bowel movement Positive flatus Negative chest pain or shortness of breath  Objective: Vital signs in last 24 hours: Temp:  [97.8 F (36.6 C)-99 F (37.2 C)] 98.7 F (37.1 C) (01/06 0300) Pulse Rate:  [90-100] 90 (01/06 0300) Resp:  [16-20] 16 (01/06 0300) BP: (119-141)/(78-85) 135/78 (01/06 0300) SpO2:  [90 %-96 %] 90 % (01/06 0300)  Intake/Output from previous day: 01/05 0701 - 01/06 0700 In: 600 [P.O.:600] Out: 2301 [Urine:2300; Stool:1]  Labs: No results for input(s): WBC, RBC, HCT, PLT in the last 72 hours. Recent Labs    06/26/18 1034 06/27/18 0610  NA 137 135  K 3.3* 3.7  CL 108 104  CO2 23 24  BUN 6* 5*  CREATININE 0.68 0.63  GLUCOSE 129* 105*  CALCIUM 6.9* 7.1*   No results for input(s): LABPT, INR in the last 72 hours.  Physical Exam: ABD soft Sensation intact distally Intact pulses distally Dorsiflexion/Plantar flexion intact Incision: dressing C/D/I Compartment soft Body mass index is 29.67 kg/m.   Assessment/Plan: Patient stable  xrays n/a Continue mobilization with physical therapy Continue care  Advance diet Up with therapy  HCT stable CIR ordered  Nsg: replace dressing as they are coming off   Melina Schools, MD [facility] [phone]

## 2018-06-28 NOTE — Progress Notes (Signed)
Physical Therapy Treatment Patient Details Name: Brian [name] MRN: [id number] DOB: 05-Aug-1945 Today's Date: 06/28/2018    History of Present Illness 73 yo s/p L3-5 PLIF 12/22 after L4 corpectomy 12/20 due to metastic with lesion with primary stage III lung CA commanded by acute respiratory failure intubated from 12/20-12/23.  Underwent posterior decompression L4-5 on 06/24/18.  PMH: emphysema, pathologic L4 fx, lung CA with mets to the bone, RA, abdominal aortic aneurysm    PT Comments    Pt much improved today. Pt able to stand with min/modAx2 without use of stedy and was able to tolerate 2 sessions of amb with RW at 58' with seated rest break. Pt's pain under better control and patient is able to tolerate more therapy. Pt remains appropriate for CIR upon d/c for maximal functional return for safe transition home with spouse.  Follow Up Recommendations  CIR;Supervision/Assistance - 24 hour     Equipment Recommendations  None recommended by PT    Recommendations for Other Services Rehab consult     Precautions / Restrictions Precautions Precautions: Back Precaution Booklet Issued: Yes (comment) Precaution Comments: re-educated on BLT, pt cont to be reminded during function Required Braces or Orthoses: Spinal Brace Spinal Brace: Thoracolumbosacral orthotic;Applied in sitting position Restrictions Weight Bearing Restrictions: No    Mobility  Bed Mobility Overal bed mobility: Needs Assistance Bed Mobility: Sidelying to Sit;Rolling Rolling: Min assist Sidelying to sit: Min assist       General bed mobility comments: verbal cues to bring R UE across body to reach for bedrail to minimize twisting, minA for trunk elevation, pt able to manage LEs off EOB  Transfers Overall transfer level: Needs assistance Equipment used: Rolling walker (2 wheeled) Transfers: Sit to/from Stand Sit to Stand: Min assist;Mod assist;+2 physical assistance         General transfer comment:  assist to power up, despite max verbal cues to push up from bed pt cont to pull up from walker requiring PT/TECH to hold down walker. pt did push up from arm rest of chairs but required modAx2 to power up and maitain balance during transition of hands from chair to RW  Ambulation/Gait Ambulation/Gait assistance: Min assist;+2 safety/equipment Gait Distance (Feet): 75 Feet(x2) Assistive device: Rolling walker (2 wheeled) Gait Pattern/deviations: Step-through pattern;Decreased step length - right;Decreased step length - left;Trunk flexed Gait velocity: decreased Gait velocity interpretation: <1.31 ft/sec, indicative of household ambulator General Gait Details: increased UE dependence, verbal cues to stay in walker, minA for walker management   Stairs             Wheelchair Mobility    Modified Rankin (Stroke Patients Only)       Balance Overall balance assessment: Needs assistance Sitting-balance support: Feet supported Sitting balance-Leahy Scale: Fair Sitting balance - Comments: able to tolerate sitting EOB without UE support x 15 sec. sitting balance improving   Standing balance support: Bilateral upper extremity supported Standing balance-Leahy Scale: Poor Standing balance comment: needs support of UEs                            Cognition Arousal/Alertness: Awake/alert Behavior During Therapy: WFL for tasks assessed/performed Overall Cognitive Status: Within Functional Limits for tasks assessed                                        Exercises  General Comments General comments (skin integrity, edema, etc.): VSS      Pertinent Vitals/Pain Pain Assessment: 0-10 Pain Score: 6  Pain Location: low back, swollen scrottum Pain Descriptors / Indicators: Aching Pain Intervention(s): Monitored during session    Home Living                      Prior Function            PT Goals (current goals can now be found in the  care plan section) Acute Rehab PT Goals Patient Stated Goal: to get out of here Progress towards PT goals: Progressing toward goals    Frequency    Min 5X/week      PT Plan Current plan remains appropriate    Co-evaluation              AM-PAC PT "6 Clicks" Mobility   Outcome Measure  Help needed turning from your back to your side while in a flat bed without using bedrails?: A Little Help needed moving from lying on your back to sitting on the side of a flat bed without using bedrails?: A Little Help needed moving to and from a bed to a chair (including a wheelchair)?: A Lot Help needed standing up from a chair using your arms (e.g., wheelchair or bedside chair)?: A Lot Help needed to walk in hospital room?: A Little Help needed climbing 3-5 steps with a railing? : Total 6 Click Score: 14    End of Session Equipment Utilized During Treatment: Back brace Activity Tolerance: Patient limited by fatigue Patient left: in chair;with call bell/phone within reach;with family/visitor present;with chair alarm set Nurse Communication: Mobility status PT Visit Diagnosis: Muscle weakness (generalized) (M62.81);Difficulty in walking, not elsewhere classified (R26.2) Pain - Right/Left: Right     Time: 1594-5859 PT Time Calculation (min) (ACUTE ONLY): 25 min  Charges:  $Gait Training: 23-37 mins                     Brian [name], PT, DPT Acute Rehabilitation Services Pager #: [phone] Office #: [phone]    Berline Lopes 06/28/2018, 11:41 AM

## 2018-06-28 NOTE — Progress Notes (Signed)
Consult progress note                                                                              Patient Demographics  Brian [name], is a 73 y.o. male, DOB - 31-Mar-1946, IFO:[id number]  Admit date - 06/04/2018   Admitting Physician Latanya Maudlin, MD  Outpatient Primary MD for the patient is [name], MD  Outpatient specialists:   LOS - 24  days   Medical records reviewed and are as summarized below:    No chief complaint on file.      Brief summary   Brian [name] 73 year old man found to have metastatic lung cancer, pathologic invasion of L4, underwent L4 corpectomy with reconstruction and posterior pedicle screws L3-L5. Developed a rash over his back and hospitalist consult was requested.   Assessment & Plan    Rash over flank, back, hips, left clavicular area  -Nonpruritic, nontender and no lesions. -Given the appearance of the rash, possibly contact dermatitis -Rash with mild improvement after starting clobetasol with emollients yesterday, recommended clobetasol twice a day with petrolatum emollient in between 3- 4 times a day.  Explained how to administer in detail to the patient's family, for total of 7 days.  -If no improvement after 7 days, will need to see a dermatologist -Continue cotton sheets, possibly patient has contact dermatitis from the hospital linen   Normocytic anemia, anemia of chronic disease -H&H stable   Metastatic squamous cell lung cancer, status post decompressive lumbar laminectomy L4-L5  -Management per orthopedics  Code Status: Full CODE STATUS DVT Prophylaxis: SCD's Family Communication: Discussed in detail with the patient, all imaging results, lab results explained to the patient, wife and daughter   Disposition Plan: Rash improving, continue above recommendations.  Medically stable for discharge per orthopedics. Will sign off.   Time Spent in minutes   15 minutes  Procedures:  Lumbar laminectomy  L4-L5  Antimicrobials:      Medications  Scheduled Meds: . clobetasol ointment   Topical BID  . feeding supplement (ENSURE ENLIVE)  237 mL Oral BID BM  . fentaNYL  12.5 mcg Transdermal Q72H  . gabapentin  300 mg Oral TID  . guaiFENesin  10 mL Oral Q6H  . pantoprazole  40 mg Oral Daily  . polyethylene glycol  17 g Oral Daily  . senna  1 tablet Oral Daily  . sodium chloride flush  3 mL Intravenous Q12H   Continuous Infusions: . sodium chloride 250 mL (06/24/18 1521)   PRN Meds:.acetaminophen **OR** acetaminophen, albuterol, bisacodyl, HYDROcodone-acetaminophen, HYDROmorphone HCl, menthol-cetylpyridinium **OR** phenol, methocarbamol **OR** [DISCONTINUED] methocarbamol (ROBAXIN) IV, ondansetron **OR** [DISCONTINUED] ondansetron (ZOFRAN) IV, sodium chloride flush, sodium phosphate   Antibiotics   Anti-infectives (From admission, onward)   Start     Dose/Rate Route Frequency Ordered Stop   06/24/18 2200  vancomycin (VANCOCIN) 1,250 mg in sodium chloride 0.9 % 250 mL IVPB     1,250 mg 166.7 mL/hr over 90 Minutes Intravenous  Once 06/24/18 1444 06/24/18 2341   06/24/18 2100  vancomycin (VANCOCIN) IVPB 1000 mg/200 mL premix  Status:  Discontinued     1,000 mg 200 mL/hr  over 60 Minutes Intravenous Every 12 hours 06/24/18 1417 06/24/18 1444   06/24/18 0900  vancomycin (VANCOCIN) IVPB 1000 mg/200 mL premix     1,000 mg 200 mL/hr over 60 Minutes Intravenous To Surgery 06/24/18 0741 06/24/18 1437   06/13/18 2300  vancomycin (VANCOCIN) IVPB 1000 mg/200 mL premix  Status:  Discontinued     1,000 mg 200 mL/hr over 60 Minutes Intravenous Every 12 hours 06/13/18 1516 06/14/18 1734   06/12/18 0000  vancomycin (VANCOCIN) IVPB 1000 mg/200 mL premix  Status:  Discontinued     1,000 mg 200 mL/hr over 60 Minutes Intravenous Every 12 hours 06/11/18 2023 06/13/18 1401   06/11/18 1230  vancomycin (VANCOCIN) 1,500 mg in sodium chloride 0.9 % 500 mL IVPB     1,500 mg 250 mL/hr over 120 Minutes  Intravenous  Once 06/11/18 1224 06/11/18 1434        Subjective:   Brian [name] was seen and examined today.  Per family at the bedside, rash mildly improved after starting steroid ointment.  No acute complaints, no tenderness or any new lesions.  Objective:   Vitals:   06/27/18 2022 06/27/18 2304 06/28/18 0300 06/28/18 0800  BP: 133/85 131/85 135/78 123/74  Pulse: 100 99 90   Resp: 18 16 16    Temp: 99 F (37.2 C) 98.1 F (36.7 C) 98.7 F (37.1 C) 98.2 F (36.8 C)  TempSrc: Oral Oral Oral Oral  SpO2: 90% 92% 90% 91%  Weight:      Height:        Intake/Output Summary (Last 24 hours) at 06/28/2018 1315 Last data filed at 06/28/2018 0800 Gross per 24 hour  Intake 240 ml  Output 1875 ml  Net -1635 ml     Wt Readings from Last 3 Encounters:  06/24/18 93.8 kg  05/25/18 92.5 kg    Physical Exam  General: Alert and oriented x 3, NAD  Eyes:   HEENT:   Cardiovascular: S1 S2 clear, no murmurs, RRR. No pedal edema b/l  Respiratory: CTAB, no wheezing, rales or rhonchi  Gastrointestinal: Soft, nontender, nondistended, NBS  Ext: no pedal edema bilaterally  Neuro: no new deficits  Musculoskeletal: No cyanosis, clubbing  Skin: Erythema along the back, bilateral flanks, left axillary area, slightly better from yesterday.  Psych: Normal affect and demeanor, alert and oriented x3     Data Reviewed:  I have personally reviewed following labs and imaging studies  Micro Results No results found for this or any previous visit (from the past 240 hour(s)).  Radiology Reports Dg Lumbar Spine 2-3 Views  Result Date: 06/13/2018 CLINICAL DATA:  Corpectomy and fixation of the lower lumbar spine. EXAM: DG C-ARM 61-120 MIN; LUMBAR SPINE - 2-3 VIEW COMPARISON:  CT 06/11/2018. FINDINGS: Posterior rod fixation with pedicle screws in the L3 and L5 vertebral bodies. L4 corpectomy with a spacer device. IMPRESSION: Posterior lumbar fusion and L4 corpectomy. Electronically Signed    By: Suzy Bouchard M.D.   On: 06/13/2018 13:59   Dg Lumbar Spine 2-3 Views  Result Date: 06/11/2018 CLINICAL DATA:  L4 corpectomy EXAM: LUMBAR SPINE - 2-3 VIEW; DG C-ARM 61-120 MIN COMPARISON:  MRI 06/04/2018 FINDINGS: Five low resolution intraoperative spot views of the lumbar spine. Total fluoroscopy time was 3 minutes 19 seconds. Images were obtained during L4 corpectomy and placement of interbody cage. IMPRESSION: Intraoperative fluoroscopic assistance provided during lumbar spine surgery Electronically Signed   By: Donavan Foil M.D.   On: 06/11/2018 19:07   Ct Chest W  Contrast  Result Date: 06/04/2018 CLINICAL DATA:  CLINICAL DATA Patient with a pathologic fracture in L4 on postmyelogram CT scan 06/02/2018. Evaluate for primary and metastatic disease. EXAM: CT CHEST, ABDOMEN, AND PELVIS WITH CONTRAST TECHNIQUE: Multidetector CT imaging of the chest, abdomen and pelvis was performed following the standard protocol during bolus administration of intravenous contrast. CONTRAST:  100 mL OMNIPAQUE IOHEXOL 300 MG/ML  SOLN COMPARISON:  Postmyelogram lumbar spine CT scan 06/02/2018. FINDINGS: CT CHEST FINDINGS Cardiovascular: Heart size is normal. There is calcific aortic and coronary atherosclerosis. No pericardial effusion. Mediastinum/Nodes: Right hilar lymph node on image 37 measures 1.5 cm short axis dimension. Small node anterior to the right mainstem bronchus measuring 1 cm on image 33 also noted. A few smaller mediastinal lymph nodes are noted. No axillary or supraclavicular lymphadenopathy. Lungs/Pleura: The lungs demonstrate extensive centrilobular emphysematous disease. There is a mass in the right lower lobe which measures 7.3 cm craniocaudal on image 72 of series 7 by 8.1 cm transverse by 5.1 cm AP on image 108 of series 4. Subpleural nodule in the right middle lobe on image 120 measures 0.5 cm Musculoskeletal: A destructive lesion in the medial diaphysis of the left clavicle measures 3 cm  long image 20 of series 4 and is approximately 1.4 cm medial to the clavicular head. A destructive lesion in the lateral arc of the right eighth rib measures 7 cm long by 3.1 cm transverse by 4.2 cm craniocaudal. Subtle lucent lesion lateral arc of the left seventh rib on image 105 of series 4 is also noted. No other focal bony abnormality is identified. A subcutaneous low attenuating lesion in the anterior left chest wall measures 1.8 cm AP x 2.6 cm transverse x 2.5 cm craniocaudal. CT ABDOMEN PELVIS FINDINGS Hepatobiliary: A 2.5 cm in diameter hypoattenuating lesion is seen in the left hepatic lobe near the dome on image 54. A punctate hypoattenuating lesion in the right hepatic lobe on image 70 is also identified. The liver is otherwise unremarkable. A few stones are seen in the gallbladder but there is no evidence of cholecystitis. Biliary tree appears normal. Pancreas: Unremarkable. No pancreatic ductal dilatation or surrounding inflammatory changes. Spleen: Normal in size without focal abnormality. Adrenals/Urinary Tract: Mild thickening of the left adrenal gland is most suggestive of hyperplasia. The right adrenal gland is unremarkable. Stomach/Bowel: Stomach is within normal limits. No evidence of appendicitis. No evidence of bowel wall thickening, distention, or inflammatory changes. Vascular/Lymphatic: The patient has extensive calcific atherosclerosis. Mild aneurysmal dilatation of the descending abdominal aorta at 3.3 cm is identified. No lymphadenopathy. Reproductive: The prostate gland is mildly enlarged. Other: Small fat containing left inguinal hernia is noted. Musculoskeletal: Pathologic fracture of L4 is identified as seen on the comparison CT. No other bony abnormality is identified. Simple lipoma in the right gluteal musculature incidentally noted. IMPRESSION: Right lower lobe mass lesion most consistent with bronchogenic carcinoma. Destructive lesions in the left clavicle, right seventh ribs and  L4 vertebral body are consistent with metastatic disease. Small mediastinal lymph nodes measuring up to 1.5 cm are identified may also be pathologic. Two lesions in the liver are identified and worrisome for metastatic disease. Advanced emphysema. Calcific aortic and coronary atherosclerosis. 3.3 cm abdominal aortic aneurysm is identified. Recommend followup by ultrasound in 3 years. This recommendation follows ACR consensus guidelines: White Paper of the ACR Incidental Findings Committee II on Vascular Findings. J Am Coll Radiol 2013; 10:789-794 Gallstones without evidence of cholecystitis. Cystic lesion in the subcutaneous tissues of the  anterior left chest wall is likely a sebaceous cyst. Simple lipoma right gluteal musculature. Electronically Signed   By: Inge Rise M.D.   On: 06/04/2018 19:31   Ct Lumbar Spine Wo Contrast  Result Date: 06/23/2018 CLINICAL DATA:  Retroperitoneal hematoma follow-up EXAM: CT LUMBAR SPINE WITHOUT CONTRAST TECHNIQUE: Multidetector CT imaging of the lumbar spine was performed without intravenous contrast administration. Multiplanar CT image reconstructions were also generated. COMPARISON:  Lumbar spine MRI 06/18/2018 FINDINGS: Segmentation: 5 lumbar type vertebrae. Alignment: Normal. Vertebrae: Redemonstration of L3-5 posterior spinal fusion with corpectomy at L4. Normal appearance of the hardware. Paraspinal and other soft tissues: There are large right and small left retroperitoneal hematomas expanding the psoas muscles. The right-sided hematoma measures 19.2 by 10.0 x 6.7 cm. The left-sided hematoma measures 11.8 x 4.7 by 4.9 cm. Disc levels: No upper spinal canal stenosis. Assessment of the lower lumbar spinal canal is limited by streak artifact from the hardware. IMPRESSION: 1. Large right and small left retroperitoneal hematomas expanding the psoas muscles. Comparison to the MRI of 06/18/2018 is limited given differences in field of view, but sizes are approximately  the same. 2. No acute fracture or static subluxation of the lumbar spine. The Electronically Signed   By: Ulyses Jarred M.D.   On: 06/23/2018 05:17   Ct Lumbar Spine W Contrast  Result Date: 06/02/2018 CLINICAL DATA:  Low back and bilateral leg pain. EXAM: LUMBAR MYELOGRAM FLUOROSCOPY TIME:  0 minutes 38 seconds. 142.58 micro gray meter squared PROCEDURE: After thorough discussion of risks and benefits of the procedure including bleeding, infection, injury to nerves, blood vessels, adjacent structures as well as headache and CSF leak, written and oral informed consent was obtained. Consent was obtained by Dr. [name]. Time out form was completed. Patient was positioned prone on the fluoroscopy table. Local anesthesia was provided with 1% lidocaine without epinephrine after prepped and draped in the usual sterile fashion. Puncture was performed at L4-5 using a 3 1/2 inch 22-gauge spinal needle via right paramedian approach. Using a single pass through the dura, the needle was placed within the thecal sac, with return of clear CSF. 15 mL of Isovue M-200 was injected into the thecal sac, with normal opacification of the nerve roots and cauda equina consistent with free flow within the subarachnoid space. I personally performed the lumbar puncture and administered the intrathecal contrast. I also personally performed acquisition of the myelogram images. TECHNIQUE: Contiguous axial images were obtained through the Lumbar spine after the intrathecal infusion of infusion. Coronal and sagittal reconstructions were obtained of the axial image sets. COMPARISON:  Radiography 04/13/2018 FINDINGS: LUMBAR MYELOGRAM FINDINGS: There is no compressive central canal stenosis. There is an anterior extradural defect at the L4 level with an abnormal appearance of the posterior L4 vertebral body. There appears to be lytic change of the bone, new since the radiography of October. Extradural defect is present with some canal  narrowing and lateral recess encroachment right more than left. L5-S1 shows disc space narrowing. Standing flexion extension views show perhaps lighted crease in prominence of the extradural defect at L4. No antero or retrolisthesis is seen. CT LUMBAR MYELOGRAM FINDINGS: No significant abnormality is seen at L2-3 or above. The vertebral bodies appear normal. The disc spaces are unremarkable. No compressive stenosis. At L4, there is a pathologic compression fracture with lytic destruction. There is loss of height posteriorly of 40%. Extraosseous tumor encroaches upon the spinal canal and both of the L4-5 intervertebral foramina, right more than left.  Extraosseous tumor extends from the superior endplate of L4 down as far as the L4-5 disc. This is consistent with either metastatic carcinoma or myeloma. At L5-S1, there is chronic disc degeneration with vacuum phenomenon and bulging of the disc. There is bilateral facet degeneration. No central canal stenosis. There is bilateral foraminal narrowing right more than left. As shown at radiography, there is an infrarenal abdominal aortic aneurysm with transverse diameter maximal 3.8 cm. There are calcified gallstones within the gallbladder. No other regional finding. IMPRESSION: Pathologic fracture at L4 with maximal loss of height posteriorly of 40%. Lytic destruction of the L4 vertebral body. Extraosseous tumor encroaching upon the spinal canal at the L4 level, worse on the right than the left, with some foraminal encroachment worse on the right than the left at L4-5. This could be metastatic carcinoma or myeloma. Chronic degenerative disc disease at L5-S1 but without significant stenosis. Infrarenal abdominal aortic aneurysm with maximal transverse diameter of 3.8 cm. Recommend followup by ultrasound in 2 years. This recommendation follows ACR consensus guidelines: White Paper of the ACR Incidental Findings Committee II on Vascular Findings. J Am Coll Radiol 2013;  10:789-794. Chololithiasis. These results will be called to the ordering clinician or representative by the Radiologist Assistant, and communication documented in the PACS or zVision Dashboard. Electronically Signed   By: [name] M.D.   On: 06/02/2018 11:59   Mr Jeri Cos YQ Contrast  Result Date: 06/07/2018 CLINICAL DATA:  Staging small cell lung cancer. EXAM: MRI HEAD WITHOUT AND WITH CONTRAST TECHNIQUE: Multiplanar, multiecho pulse sequences of the brain and surrounding structures were obtained without and with intravenous contrast. CONTRAST:  9 cc Gadavist. COMPARISON:  None. FINDINGS: Brain: Diffusion imaging does not show any acute or subacute infarction. There are a few old small vessel insults within the pons and cerebellum. Cerebral hemispheres show moderate chronic small-vessel ischemic changes of the deep and subcortical white matter without large vessel territory infarction. No evidence of hemorrhage, hydrocephalus or extra-axial collection. After contrast administration, there is no abnormal enhancement to suggest primary or metastatic mass lesion. Vascular: Major vessels at the base of the brain show flow. Skull and upper cervical spine: Negative Sinuses/Orbits: Clear/normal Other: None IMPRESSION: No evidence of metastatic disease. Moderate chronic small-vessel ischemic changes affecting the brain as outlined above. Electronically Signed   By: [name] M.D.   On: 06/07/2018 14:45   Mr Lumbar Spine W Wo Contrast  Result Date: 06/18/2018 CLINICAL DATA:  Status post L4 corpectomy for resection of neoplasm. Increased low back pain. EXAM: MRI LUMBAR SPINE WITHOUT AND WITH CONTRAST TECHNIQUE: Multiplanar and multiecho pulse sequences of the lumbar spine were obtained without and with intravenous contrast. CONTRAST:  9 mL Gadavist COMPARISON:  Lumbar spine MRI 06/04/2018 FINDINGS: Segmentation:  Standard. Alignment:  Physiologic. Vertebrae: Status post L4 corpectomy and L3-L5 fusion. Bone  marrow signal at the L4 level is largely obscured by susceptibility artifacts from the fusion/corpectomy hardware. Conus medullaris and cauda equina: Conus extends to the level. Conus and cauda equina appear normal. Paraspinal and other soft tissues: The right psoas muscle is markedly expanded at the L4 level, with abnormal internal T1-weighted signal. No associated contrast enhancement. There is edema of the left psoas muscle. Disc levels: Severe spinal canal stenosis at the L4 level is unchanged compared to the prior study. The posterior bulging of the vertebral body is unchanged. There is severe right foraminal stenosis. The other disc levels are unchanged. IMPRESSION: 1. Retroperitoneal hematoma expanding the right psoas muscle  measuring up to 8.5 x 5.7 cm. 2. Status post L4 corpectomy and L3-5 fusion with unchanged severe spinal canal stenosis at the L4 level and severe right foraminal narrowing. 3. Magnetic susceptibility effects from the fusion and corpectomy hardware limit assessment of the bone marrow signal at the postoperative levels. These results will be called to the ordering clinician or representative by the Radiologist Assistant, and communication documented in the PACS or zVision Dashboard. Electronically Signed   By: Ulyses Jarred M.D.   On: 06/18/2018 22:21   Mr Lumbar Spine W Wo Contrast  Result Date: 06/04/2018 CLINICAL DATA:  Metastatic disease.  Lung cancer. EXAM: MRI LUMBAR SPINE WITHOUT AND WITH CONTRAST TECHNIQUE: Multiplanar and multiecho pulse sequences of the lumbar spine were obtained without and with intravenous contrast. CONTRAST:  7.5 mL Gadavist COMPARISON:  CT lumbar spine 06/02/2018 FINDINGS: Segmentation: Normal. The lowest disc space is considered to be L5-S1. Alignment:  Normal Vertebrae: There is loss of normal signal throughout the L4 vertebral body, where there is a known pathologic fracture with approximately 40% height loss. There is posterior convex bulging with  multifocal contrast-enhancement. There is thickening of the overlying ventral dura with associated increased contrast enhancement. This results in severe spinal canal stenosis at this level. There are no other focal osseous lesions. Conus medullaris and cauda equina: The conus medullaris terminates at the L1 level. The cauda equina and conus medullaris are both normal. Paraspinal and other soft tissues: The visualized aorta, IVC and iliac vessels are normal. The visualized retroperitoneal organs and paraspinal soft tissues are normal. Disc levels: Sagittal plane imaging includes the T11-12 disc level through the upper sacrum, with axial imaging of the T12-L1 to L5-S1 disc levels. There is disc desiccation at the T12-L1, L1-L2 and L2-L3 levels without spinal canal stenosis or neural impingement. L3-4: Small disc bulge without associated stenosis. At the L4 level, there is severe spinal canal stenosis due to posterior bulging of the L4 vertebral body. L4-5: Minimal disc bulge. There is severe right neural foraminal stenosis due to the L4 mass. Mild left foraminal stenosis. L5-S1: Disc desiccation and mild endplate spurring. Moderate right neural foraminal stenosis. The visualized portion of the sacrum is normal. IMPRESSION: 1. Metastatic lesion of the L4 vertebral body with associated pathologic fracture and posterior bulging of the vertebral body. This, in combination with overlying epidural thickening, causes severe spinal canal stenosis and severe right neural foraminal stenosis. 2. Moderate right L5-S1 neural foraminal stenosis. Electronically Signed   By: Ulyses Jarred M.D.   On: 06/04/2018 22:20   Nm Bone Scan Whole Body  Result Date: 06/07/2018 CLINICAL DATA:  Metastatic spine tumor. EXAM: NUCLEAR MEDICINE WHOLE BODY BONE SCAN TECHNIQUE: Whole body anterior and posterior images were obtained approximately 3 hours after intravenous injection of radiopharmaceutical. RADIOPHARMACEUTICALS:  20.2 mCi  Technetium-23m MDP IV COMPARISON:  MRI of June 04, 2018. CT scan of June 04, 2018. FINDINGS: Abnormal uptake is seen in left knee consistent with degenerative change. Abnormal uptake is seen at L4 level consistent with metastatic lesion seen on prior MRI. Focus of abnormal uptake is seen involving the posterior portion of a right rib which is consistent with metastatic disease. Abnormal uptake is seen involving the anterior portions of the left first rib and left clavicle consistent with metastatic disease as described on prior CT scan. IMPRESSION: Abnormal uptake is seen in midportion of right rib, L4 vertebral body and anterior portions of left first rib and left clavicle consistent with metastatic disease. This correlates  with findings seen on prior CT scan. Electronically Signed   By: Marijo Conception, M.D.   On: 06/07/2018 13:43   Ct Abdomen Pelvis W Contrast  Result Date: 06/04/2018 CLINICAL DATA:  CLINICAL DATA Patient with a pathologic fracture in L4 on postmyelogram CT scan 06/02/2018. Evaluate for primary and metastatic disease. EXAM: CT CHEST, ABDOMEN, AND PELVIS WITH CONTRAST TECHNIQUE: Multidetector CT imaging of the chest, abdomen and pelvis was performed following the standard protocol during bolus administration of intravenous contrast. CONTRAST:  100 mL OMNIPAQUE IOHEXOL 300 MG/ML  SOLN COMPARISON:  Postmyelogram lumbar spine CT scan 06/02/2018. FINDINGS: CT CHEST FINDINGS Cardiovascular: Heart size is normal. There is calcific aortic and coronary atherosclerosis. No pericardial effusion. Mediastinum/Nodes: Right hilar lymph node on image 37 measures 1.5 cm short axis dimension. Small node anterior to the right mainstem bronchus measuring 1 cm on image 33 also noted. A few smaller mediastinal lymph nodes are noted. No axillary or supraclavicular lymphadenopathy. Lungs/Pleura: The lungs demonstrate extensive centrilobular emphysematous disease. There is a mass in the right lower lobe  which measures 7.3 cm craniocaudal on image 72 of series 7 by 8.1 cm transverse by 5.1 cm AP on image 108 of series 4. Subpleural nodule in the right middle lobe on image 120 measures 0.5 cm Musculoskeletal: A destructive lesion in the medial diaphysis of the left clavicle measures 3 cm long image 20 of series 4 and is approximately 1.4 cm medial to the clavicular head. A destructive lesion in the lateral arc of the right eighth rib measures 7 cm long by 3.1 cm transverse by 4.2 cm craniocaudal. Subtle lucent lesion lateral arc of the left seventh rib on image 105 of series 4 is also noted. No other focal bony abnormality is identified. A subcutaneous low attenuating lesion in the anterior left chest wall measures 1.8 cm AP x 2.6 cm transverse x 2.5 cm craniocaudal. CT ABDOMEN PELVIS FINDINGS Hepatobiliary: A 2.5 cm in diameter hypoattenuating lesion is seen in the left hepatic lobe near the dome on image 54. A punctate hypoattenuating lesion in the right hepatic lobe on image 70 is also identified. The liver is otherwise unremarkable. A few stones are seen in the gallbladder but there is no evidence of cholecystitis. Biliary tree appears normal. Pancreas: Unremarkable. No pancreatic ductal dilatation or surrounding inflammatory changes. Spleen: Normal in size without focal abnormality. Adrenals/Urinary Tract: Mild thickening of the left adrenal gland is most suggestive of hyperplasia. The right adrenal gland is unremarkable. Stomach/Bowel: Stomach is within normal limits. No evidence of appendicitis. No evidence of bowel wall thickening, distention, or inflammatory changes. Vascular/Lymphatic: The patient has extensive calcific atherosclerosis. Mild aneurysmal dilatation of the descending abdominal aorta at 3.3 cm is identified. No lymphadenopathy. Reproductive: The prostate gland is mildly enlarged. Other: Small fat containing left inguinal hernia is noted. Musculoskeletal: Pathologic fracture of L4 is identified  as seen on the comparison CT. No other bony abnormality is identified. Simple lipoma in the right gluteal musculature incidentally noted. IMPRESSION: Right lower lobe mass lesion most consistent with bronchogenic carcinoma. Destructive lesions in the left clavicle, right seventh ribs and L4 vertebral body are consistent with metastatic disease. Small mediastinal lymph nodes measuring up to 1.5 cm are identified may also be pathologic. Two lesions in the liver are identified and worrisome for metastatic disease. Advanced emphysema. Calcific aortic and coronary atherosclerosis. 3.3 cm abdominal aortic aneurysm is identified. Recommend followup by ultrasound in 3 years. This recommendation follows ACR consensus guidelines: White Paper of  the ACR Incidental Findings Committee II on Vascular Findings. J Am Coll Radiol 2013; 10:789-794 Gallstones without evidence of cholecystitis. Cystic lesion in the subcutaneous tissues of the anterior left chest wall is likely a sebaceous cyst. Simple lipoma right gluteal musculature. Electronically Signed   By: Inge Rise M.D.   On: 06/04/2018 19:31   Dg Lumbar Spine 1 View  Result Date: 06/24/2018 CLINICAL DATA:  Decompressive laminectomy L4-5 EXAM: LUMBAR SPINE - 1 VIEW COMPARISON:  None. FINDINGS: Changes of corpectomy at L4 and posterior fusion from L3-L5. Posterior needles are directed at the L4 corpectomy level. IMPRESSION: Intraoperative localization as above. Electronically Signed   By: Rolm Baptise M.D.   On: 06/24/2018 12:06   Dg Chest Port 1 View  Result Date: 06/13/2018 CLINICAL DATA:  ETT, Respiratory failure, non smoker. EXAM: PORTABLE CHEST 1 VIEW COMPARISON:  None. FINDINGS: Endotracheal tube and NG tube in good position. Linear density in the LEFT lower lobe unchanged. Persistent low lung volumes. Upper lungs clear. IMPRESSION: 1. No interval change.  Stable support apparatus. 2. Atelectasis in the LEFT lower lobe. Electronically Signed   By: Suzy Bouchard M.D.   On: 06/13/2018 07:32   Dg Chest Port 1 View  Result Date: 06/12/2018 CLINICAL DATA:  Right lower lobe lung mass. EXAM: PORTABLE CHEST 1 VIEW COMPARISON:  06/11/2018 FINDINGS: 1340 hours. Endotracheal tube tip is 7.2 cm above the base of the carina. The NG tube passes into the stomach although the distal tip position is not included on the film. Interval increase in left base atelectasis. Right lower lobe pulmonary mass again noted. Interstitial markings are diffusely coarsened with chronic features. Probable tiny pleural effusions. No worrisome lytic or sclerotic osseous abnormality. Telemetry leads overlie the chest. IMPRESSION: New left lower lobe atelectasis with potential small bilateral effusions. Right lower lobe pulmonary mass. Chronic underlying interstitial lung disease. Electronically Signed   By: Misty Stanley M.D.   On: 06/12/2018 14:18   Dg Chest Port 1 View  Result Date: 06/11/2018 CLINICAL DATA:  Intubated. EXAM: PORTABLE CHEST 1 VIEW COMPARISON:  Chest CT 06/04/2018. FINDINGS: 2036 hours. Endotracheal tube tip is 8.2 cm above the base of the carina. Heart size upper normal. Interstitial markings are diffusely coarsened with chronic features. Known right lower lobe mass projects over the right hilum. There is right base atelectasis or infiltrate. IMPRESSION: 1. Endotracheal tube tip is 8.2 cm above the base the carina. 2. Known right lower lobe mass projects over the right hilum. 3. Chronic interstitial lung disease. Atelectasis or infiltrate noted at the right base. Electronically Signed   By: Misty Stanley M.D.   On: 06/11/2018 20:50   Dg Spine Portable 1 View  Result Date: 06/24/2018 CLINICAL DATA:  Tumor of L4 vertebral body. EXAM: PORTABLE SPINE - 1 VIEW COMPARISON:  MRI dated 06/18/2018 and CT scan dated 06/23/2018 FINDINGS: Lateral view of the lumbar spine demonstrates an instrument under the lamina of L4. Previous L4 partial corpectomy and posterior fusion from  L3-L5. IMPRESSION: Instrument under the lamina of L4. Electronically Signed   By: Lorriane Shire M.D.   On: 06/24/2018 11:20   Dg Abd Portable 1v  Result Date: 06/11/2018 CLINICAL DATA:  Orogastric tube placement. EXAM: PORTABLE ABDOMEN - 1 VIEW COMPARISON:  CT of earlier today FINDINGS: Nasogastric terminates at the body of the stomach. Bibasilar airspace disease, likely atelectasis. No gross free intraperitoneal air or bowel obstruction identified. IMPRESSION: Nasogastric terminating at the body of the stomach. Electronically Signed  By: Abigail Miyamoto M.D.   On: 06/11/2018 23:36   Dg C-arm 1-60 Min  Result Date: 06/13/2018 CLINICAL DATA:  Corpectomy and fixation of the lower lumbar spine. EXAM: DG C-ARM 61-120 MIN; LUMBAR SPINE - 2-3 VIEW COMPARISON:  CT 06/11/2018. FINDINGS: Posterior rod fixation with pedicle screws in the L3 and L5 vertebral bodies. L4 corpectomy with a spacer device. IMPRESSION: Posterior lumbar fusion and L4 corpectomy. Electronically Signed   By: Suzy Bouchard M.D.   On: 06/13/2018 13:59   Dg C-arm 1-60 Min  Result Date: 06/11/2018 CLINICAL DATA:  L4 corpectomy EXAM: LUMBAR SPINE - 2-3 VIEW; DG C-ARM 61-120 MIN COMPARISON:  MRI 06/04/2018 FINDINGS: Five low resolution intraoperative spot views of the lumbar spine. Total fluoroscopy time was 3 minutes 19 seconds. Images were obtained during L4 corpectomy and placement of interbody cage. IMPRESSION: Intraoperative fluoroscopic assistance provided during lumbar spine surgery Electronically Signed   By: Donavan Foil M.D.   On: 06/11/2018 19:07   Dg Myelography Lumbar Inj Lumbosacral  Result Date: 06/02/2018 CLINICAL DATA:  Low back and bilateral leg pain. EXAM: LUMBAR MYELOGRAM FLUOROSCOPY TIME:  0 minutes 38 seconds. 142.58 micro gray meter squared PROCEDURE: After thorough discussion of risks and benefits of the procedure including bleeding, infection, injury to nerves, blood vessels, adjacent structures as well as  headache and CSF leak, written and oral informed consent was obtained. Consent was obtained by Dr. [name]. Time out form was completed. Patient was positioned prone on the fluoroscopy table. Local anesthesia was provided with 1% lidocaine without epinephrine after prepped and draped in the usual sterile fashion. Puncture was performed at L4-5 using a 3 1/2 inch 22-gauge spinal needle via right paramedian approach. Using a single pass through the dura, the needle was placed within the thecal sac, with return of clear CSF. 15 mL of Isovue M-200 was injected into the thecal sac, with normal opacification of the nerve roots and cauda equina consistent with free flow within the subarachnoid space. I personally performed the lumbar puncture and administered the intrathecal contrast. I also personally performed acquisition of the myelogram images. TECHNIQUE: Contiguous axial images were obtained through the Lumbar spine after the intrathecal infusion of infusion. Coronal and sagittal reconstructions were obtained of the axial image sets. COMPARISON:  Radiography 04/13/2018 FINDINGS: LUMBAR MYELOGRAM FINDINGS: There is no compressive central canal stenosis. There is an anterior extradural defect at the L4 level with an abnormal appearance of the posterior L4 vertebral body. There appears to be lytic change of the bone, new since the radiography of October. Extradural defect is present with some canal narrowing and lateral recess encroachment right more than left. L5-S1 shows disc space narrowing. Standing flexion extension views show perhaps lighted crease in prominence of the extradural defect at L4. No antero or retrolisthesis is seen. CT LUMBAR MYELOGRAM FINDINGS: No significant abnormality is seen at L2-3 or above. The vertebral bodies appear normal. The disc spaces are unremarkable. No compressive stenosis. At L4, there is a pathologic compression fracture with lytic destruction. There is loss of height  posteriorly of 40%. Extraosseous tumor encroaches upon the spinal canal and both of the L4-5 intervertebral foramina, right more than left. Extraosseous tumor extends from the superior endplate of L4 down as far as the L4-5 disc. This is consistent with either metastatic carcinoma or myeloma. At L5-S1, there is chronic disc degeneration with vacuum phenomenon and bulging of the disc. There is bilateral facet degeneration. No central canal stenosis. There is bilateral foraminal  narrowing right more than left. As shown at radiography, there is an infrarenal abdominal aortic aneurysm with transverse diameter maximal 3.8 cm. There are calcified gallstones within the gallbladder. No other regional finding. IMPRESSION: Pathologic fracture at L4 with maximal loss of height posteriorly of 40%. Lytic destruction of the L4 vertebral body. Extraosseous tumor encroaching upon the spinal canal at the L4 level, worse on the right than the left, with some foraminal encroachment worse on the right than the left at L4-5. This could be metastatic carcinoma or myeloma. Chronic degenerative disc disease at L5-S1 but without significant stenosis. Infrarenal abdominal aortic aneurysm with maximal transverse diameter of 3.8 cm. Recommend followup by ultrasound in 2 years. This recommendation follows ACR consensus guidelines: White Paper of the ACR Incidental Findings Committee II on Vascular Findings. J Am Coll Radiol 2013; 10:789-794. Chololithiasis. These results will be called to the ordering clinician or representative by the Radiologist Assistant, and communication documented in the PACS or zVision Dashboard. Electronically Signed   By: [name] M.D.   On: 06/02/2018 11:59   Ct Angio Abd/pel W/ And/or W/o  Result Date: 06/11/2018 CLINICAL DATA:  73 year old male with concern for postoperative bleeding during corpectomy L4. History of known metastatic disease, bronchogenic carcinoma EXAM: CT ANGIOGRAPHY ABDOMEN AND PELVIS  WITH CONTRAST AND WITHOUT CONTRAST TECHNIQUE: Multidetector CT imaging of the abdomen and pelvis was performed using the standard protocol during bolus administration of intravenous contrast. Multiplanar reconstructed images and MIPs were obtained and reviewed to evaluate the vascular anatomy. CONTRAST:  135mL ISOVUE-370 IOPAMIDOL (ISOVUE-370) INJECTION 76% COMPARISON:  CT 06/04/2018, 06/02/2018 FINDINGS: VASCULAR Aorta: Mild atherosclerotic changes of the abdominal aorta. Infrarenal abdominal aortic aneurysm with the largest diameter measuring 3.5 cm near the IMA origin. No periaortic fluid. No dissection. Median sacral artery is of small caliber anterior to the sacrum, does not appear to contribute to hematoma of the retroperitoneum. Separate origin of bilateral L5 lumbar arteries. Contrast staining of the right paravertebral soft tissues/retroperitoneum/psoas muscle just lateral to the corpectomy site. Hematoma at this site measures 22 mm in AP diameter just lateral to the corpectomy hardware in the region of the tumor resection/treatment. Contrast staining within the psoas musculature, with the largest focus 10 mm. Celiac: Celiac artery patent with no significant atherosclerotic changes at the origin. Branches are patent. Replaced left hepatic artery. SMA: SMA patent with mild atherosclerotic changes at the origin, no significant stenosis. Branches are patent. Renals: Single right renal artery with no significant atherosclerotic changes at the origin. The main left renal artery originates from the 3 o'clock position with minimal atherosclerotic changes. There is an accessory left renal artery from the 1 o'clock position to the superior pole. IMA: IMA patent Right lower extremity: Tortuous right iliac system. Mild atherosclerotic changes of the iliac system. Hypogastric artery is patent. External iliac artery patent. Common femoral artery patent with mild atherosclerotic changes. Proximal SFA and profunda femoris  patent. Left lower extremity: Tortuous left iliac system. Mild atherosclerotic changes. Hypogastric artery is patent. External iliac artery patent. Mild atherosclerotic changes of the common femoral artery. Profunda femoris and SFA patent. Veins: Unremarkable appearance of the venous system. Review of the MIP images confirms the above findings. NON-VASCULAR Lower chest: Redemonstration of known right lower lobe mass with associated atelectasis/consolidation, incompletely imaged. Atelectasis at the dependent aspects of the left lower lobe. Redemonstration of right chest wall metastatic lesion centered within the angle of the right eighth rib. Greatest diameter on axial images measures 2.9 cm x 7.0 cm,  relatively unchanged from the comparison CT. Hepatobiliary: Redemonstration of segment 2 metastatic lesion with heterogeneous enhancement measures 3.2 cm, slightly larger than the comparison CT, though potentially measurement difference secondary to technique. Questionable small lesion in the lateral right liver measures 4 mm-5 mm. Heterogeneous enhancement in the inferior aspects of the right liver of uncertain significance. Calcified stones within the gallbladder. Pancreas: Unremarkable pancreas Spleen: Unremarkable spleen Adrenals/Urinary Tract: Unremarkable adrenal glands Right: No hydronephrosis. Symmetric perfusion to the left. No nephrolithiasis. Unremarkable course of the right ureter. Left: No hydronephrosis. Symmetric perfusion to the right. No nephrolithiasis. Unremarkable course of the left ureter. Balloon retention catheter in the urinary bladder. Air-fluid level within the urinary bladder, likely secondary to recent instrumentation. Stomach/Bowel: Distal esophagus is circumferentially thickened with hyperenhancement of the mucosal surface. Unremarkable stomach. Small bowel decompressed. No transition point or evidence of obstruction. Normal appendix colonic diverticula without evidence of acute  inflammatory changes. Lymphatic: No lymphadenopathy Mesenteric: Postsurgical changes of the left abdominal wall with surgical drain from the surgical site of L4 traversing the left psoas and the left retroperitoneum and left abdominal wall. Minimal gas within the right psoas and left psoas as well as the left retroperitoneum. Reproductive: Transverse prostate measures 5.4 cm Other: None Musculoskeletal: Postsurgical changes of L4 corpectomy with hardware placement centered at the vertebral body site. Metallic streak artifact somewhat obscures details at this site. Relatively unchanged appearance of soft tissue posterior to the surgical site at the anterior aspect of the canal, seen on prior myelogram to narrow the canal with. The hematoma on the right aspect of the vertebral body appears lateral and anterior to the canal. IMPRESSION: Interval postsurgical changes of L4 corpectomy with cage placement, and surgical drain from the left surgical site through the left retroperitoneum. Small hematoma in the retroperitoneum at the right lateral aspect of the spine extending into the psoas muscle, and in the setting of recent surgery could be venous or arterial. Similar appearance of soft tissue at the anterior aspect of the spinal canal at the surgical site, partially obscured by streak artifact on the current CT, and better characterized on the prior. Redemonstration of right right lower lobe lung mass and metastatic disease involving liver, right eighth rib/chest wall. Heterogeneous appearance of the right liver lobe, segment 6, on the venous phase could represent additional small metastatic lesions versus heterogeneous attenuation/perfusion. Circumferential esophageal thickening in the lower thoracic esophagus. Recommend correlation with history of GERD/esophagitis. Infrarenal abdominal aortic aneurysm measuring 3.5 cm. Aortic aneurysm NOS (ICD10-I71.9). Aortic Atherosclerosis (ICD10-I70.0). Electronically Signed   By:  Corrie Mckusick D.O.   On: 06/11/2018 21:59    Lab Data:  CBC: Recent Labs  Lab 06/21/18 1920 06/25/18 0355  WBC  --  10.6*  HGB 9.2* 8.0*  HCT 28.2* 26.0*  MCV  --  93.9  PLT  --  371   Basic Metabolic Panel: Recent Labs  Lab 06/25/18 0355 06/26/18 1034 06/27/18 0610  NA 138 137 135  K 2.8* 3.3* 3.7  CL 106 108 104  CO2 24 23 24   GLUCOSE 147* 129* 105*  BUN 7* 6* 5*  CREATININE 0.71 0.68 0.63  CALCIUM 6.6* 6.9* 7.1*  MG  --  1.7  --    GFR: Estimated Creatinine Clearance: 94.6 mL/min (by C-G formula based on SCr of 0.63 mg/dL). Liver Function Tests: No results for input(s): AST, ALT, ALKPHOS, BILITOT, PROT, ALBUMIN in the last 168 hours. No results for input(s): LIPASE, AMYLASE in the last 168 hours. No results  for input(s): AMMONIA in the last 168 hours. Coagulation Profile: No results for input(s): INR, PROTIME in the last 168 hours. Cardiac Enzymes: No results for input(s): CKTOTAL, CKMB, CKMBINDEX, TROPONINI in the last 168 hours. BNP (last 3 results) No results for input(s): PROBNP in the last 8760 hours. HbA1C: No results for input(s): HGBA1C in the last 72 hours. CBG: No results for input(s): GLUCAP in the last 168 hours. Lipid Profile: No results for input(s): CHOL, HDL, LDLCALC, TRIG, CHOLHDL, LDLDIRECT in the last 72 hours. Thyroid Function Tests: No results for input(s): TSH, T4TOTAL, FREET4, T3FREE, THYROIDAB in the last 72 hours. Anemia Panel: No results for input(s): VITAMINB12, FOLATE, FERRITIN, TIBC, IRON, RETICCTPCT in the last 72 hours. Urine analysis: No results found for: COLORURINE, APPEARANCEUR, LABSPEC, PHURINE, GLUCOSEU, HGBUR, BILIRUBINUR, KETONESUR, PROTEINUR, UROBILINOGEN, NITRITE, LEUKOCYTESUR   Nelani Schmelzle M.D. [facility] 06/28/2018, 1:15 PM  Pager: [phone] Between 7am to 7pm - call Pager - [phone]  After 7pm go to [email or web address] - password [email or web address]  Call night coverage person covering after 7pm

## 2018-06-28 NOTE — Progress Notes (Signed)
Occupational Therapy Treatment Patient Details Name: Brian [name] MRN: [id number] DOB: 04/30/1946 Today's Date: 06/28/2018    History of present illness 73 yo s/p L3-5 PLIF 12/22 after L4 corpectomy 12/20 due to metastic with lesion with primary stage III lung CA commanded by acute respiratory failure intubated from 12/20-12/23.  Underwent posterior decompression L4-5 on 06/24/18.  PMH: emphysema, pathologic L4 fx, lung CA with mets to the bone, RA, abdominal aortic aneurysm   OT comments  Patient supine in bed and willing to participate in OT.  Continues to require cueing to recall back precautions and cueing to adhere to during functional tasks.  Completing bed mobility with min assist (mod assist to return B LEs back into bed), sit to stand with mod assist +2 and grooming standing at sink with min assist, as patient is reliant on 1 UE and requires max cueing for precautions.  Patient eager to get to rehab.  Will follow.     Follow Up Recommendations  CIR    Equipment Recommendations  3 in 1 bedside commode;Other (comment)    Recommendations for Other Services Rehab consult    Precautions / Restrictions Precautions Precautions: Back Precaution Booklet Issued: Yes (comment) Precaution Comments: re-educated on BLT, pt cont to be reminded during function Required Braces or Orthoses: Spinal Brace Spinal Brace: Thoracolumbosacral orthotic;Applied in sitting position Restrictions Weight Bearing Restrictions: No       Mobility Bed Mobility Overal bed mobility: Needs Assistance Bed Mobility: Sidelying to Sit;Rolling;Sit to Sidelying Rolling: Min assist Sidelying to sit: Min assist     Sit to sidelying: Mod assist General bed mobility comments: verbal cueing for sequencing technique to transition to sitting EOB, min assist for trunk support ascending; mod assist for B LEs into bed  Transfers Overall transfer level: Needs assistance Equipment used: Rolling walker (2  wheeled) Transfers: Sit to/from Stand Sit to Stand: Mod assist;+2 physical assistance         General transfer comment: multiple attempts required to power up into standing, requires mod assist +2 with cueing for hand placement and safety    Balance Overall balance assessment: Needs assistance Sitting-balance support: Feet supported Sitting balance-Leahy Scale: Fair     Standing balance support: Single extremity supported;During functional activity Standing balance-Leahy Scale: Poor Standing balance comment: needs support of UEs                           ADL either performed or assessed with clinical judgement   ADL Overall ADL's : Needs assistance/impaired     Grooming: Minimal assistance;Standing;Wash/dry hands;Wash/dry face Grooming Details (indicate cue type and reason): cueing to maintain posture and precautions, requires 1 hand support                 Toilet Transfer: Moderate assistance;+2 for physical assistance;RW;Ambulation Toilet Transfer Details (indicate cue type and reason): simulated, cueing for hand placement and safety          Functional mobility during ADLs: Moderate assistance;+2 for physical assistance       Vision       Perception     Praxis      Cognition Arousal/Alertness: Awake/alert Behavior During Therapy: WFL for tasks assessed/performed Overall Cognitive Status: Within Functional Limits for tasks assessed  Exercises     Shoulder Instructions       General Comments VSS    Pertinent Vitals/ Pain       Pain Assessment: 0-10 Pain Score: 4  Pain Location: low back, swollen scrottum Pain Descriptors / Indicators: Aching Pain Intervention(s): Monitored during session;Repositioned  Home Living                                          Prior Functioning/Environment              Frequency  Min 3X/week        Progress Toward  Goals  OT Goals(current goals can now be found in the care plan section)  Progress towards OT goals: Progressing toward goals  Acute Rehab OT Goals Patient Stated Goal: to get out of here OT Goal Formulation: With patient/family Time For Goal Achievement: 06/28/18 Potential to Achieve Goals: Good  Plan Discharge plan remains appropriate;Frequency remains appropriate    Co-evaluation                 AM-PAC OT "6 Clicks" Daily Activity     Outcome Measure   Help from another person eating meals?: None Help from another person taking care of personal grooming?: A Little Help from another person toileting, which includes using toliet, bedpan, or urinal?: Total Help from another person bathing (including washing, rinsing, drying)?: A Lot Help from another person to put on and taking off regular upper body clothing?: A Little Help from another person to put on and taking off regular lower body clothing?: A Lot 6 Click Score: 15    End of Session Equipment Utilized During Treatment: Back brace;Rolling walker  OT Visit Diagnosis: Unsteadiness on feet (R26.81);Pain;Muscle weakness (generalized) (M62.81) Pain - Right/Left: Right Pain - part of body: (back)   Activity Tolerance Patient tolerated treatment well   Patient Left in bed;with call bell/phone within reach;with family/visitor present   Nurse Communication Mobility status;Precautions;Need for lift equipment        Time: 1449-1511 OT Time Calculation (min): 22 min  Charges: OT General Charges $OT Visit: 1 Visit OT Treatments $Self Care/Home Management : 8-22 mins  Delight Stare, [place] Pager [phone] Office [phone]     Delight Stare 06/28/2018, 4:59 PM

## 2018-06-29 ENCOUNTER — Inpatient Hospital Stay (HOSPITAL_COMMUNITY): Payer: Medicare Other

## 2018-06-29 ENCOUNTER — Other Ambulatory Visit: Payer: Self-pay

## 2018-06-29 ENCOUNTER — Inpatient Hospital Stay (HOSPITAL_COMMUNITY)
Admission: RE | Admit: 2018-06-29 | Discharge: 2018-07-30 | DRG: 500 | Disposition: A | Discharge status: Hospice | Payer: Medicare Other | Source: Intra-hospital | Attending: Physical Medicine & Rehabilitation | Admitting: Physical Medicine & Rehabilitation

## 2018-06-29 ENCOUNTER — Encounter (HOSPITAL_COMMUNITY): Payer: Self-pay | Admitting: *Deleted

## 2018-06-29 DIAGNOSIS — E876 Hypokalemia: Secondary | ICD-10-CM | POA: Diagnosis not present

## 2018-06-29 DIAGNOSIS — M069 Rheumatoid arthritis, unspecified: Secondary | ICD-10-CM | POA: Diagnosis present

## 2018-06-29 DIAGNOSIS — M4856XA Collapsed vertebra, not elsewhere classified, lumbar region, initial encounter for fracture: Secondary | ICD-10-CM | POA: Diagnosis present

## 2018-06-29 DIAGNOSIS — M899 Disorder of bone, unspecified: Secondary | ICD-10-CM | POA: Diagnosis not present

## 2018-06-29 DIAGNOSIS — Z882 Allergy status to sulfonamides status: Secondary | ICD-10-CM

## 2018-06-29 DIAGNOSIS — I82502 Chronic embolism and thrombosis of unspecified deep veins of left lower extremity: Secondary | ICD-10-CM | POA: Diagnosis not present

## 2018-06-29 DIAGNOSIS — T8130XA Disruption of wound, unspecified, initial encounter: Secondary | ICD-10-CM | POA: Diagnosis not present

## 2018-06-29 DIAGNOSIS — R509 Fever, unspecified: Secondary | ICD-10-CM

## 2018-06-29 DIAGNOSIS — M25551 Pain in right hip: Secondary | ICD-10-CM

## 2018-06-29 DIAGNOSIS — B9620 Unspecified Escherichia coli [E. coli] as the cause of diseases classified elsewhere: Secondary | ICD-10-CM | POA: Diagnosis not present

## 2018-06-29 DIAGNOSIS — E46 Unspecified protein-calorie malnutrition: Secondary | ICD-10-CM | POA: Diagnosis present

## 2018-06-29 DIAGNOSIS — M4856XS Collapsed vertebra, not elsewhere classified, lumbar region, sequela of fracture: Secondary | ICD-10-CM

## 2018-06-29 DIAGNOSIS — F17210 Nicotine dependence, cigarettes, uncomplicated: Secondary | ICD-10-CM | POA: Diagnosis present

## 2018-06-29 DIAGNOSIS — T8149XA Infection following a procedure, other surgical site, initial encounter: Secondary | ICD-10-CM | POA: Diagnosis not present

## 2018-06-29 DIAGNOSIS — G629 Polyneuropathy, unspecified: Secondary | ICD-10-CM | POA: Diagnosis present

## 2018-06-29 DIAGNOSIS — R5081 Fever presenting with conditions classified elsewhere: Secondary | ICD-10-CM | POA: Diagnosis not present

## 2018-06-29 DIAGNOSIS — C7801 Secondary malignant neoplasm of right lung: Secondary | ICD-10-CM

## 2018-06-29 DIAGNOSIS — M8448XD Pathological fracture, other site, subsequent encounter for fracture with routine healing: Secondary | ICD-10-CM | POA: Diagnosis not present

## 2018-06-29 DIAGNOSIS — K5900 Constipation, unspecified: Secondary | ICD-10-CM | POA: Diagnosis present

## 2018-06-29 DIAGNOSIS — D649 Anemia, unspecified: Secondary | ICD-10-CM | POA: Diagnosis not present

## 2018-06-29 DIAGNOSIS — R0989 Other specified symptoms and signs involving the circulatory and respiratory systems: Secondary | ICD-10-CM | POA: Diagnosis not present

## 2018-06-29 DIAGNOSIS — E871 Hypo-osmolality and hyponatremia: Secondary | ICD-10-CM | POA: Diagnosis not present

## 2018-06-29 DIAGNOSIS — R000 Tachycardia, unspecified: Secondary | ICD-10-CM | POA: Diagnosis not present

## 2018-06-29 DIAGNOSIS — G479 Sleep disorder, unspecified: Secondary | ICD-10-CM | POA: Diagnosis not present

## 2018-06-29 DIAGNOSIS — I82412 Acute embolism and thrombosis of left femoral vein: Secondary | ICD-10-CM

## 2018-06-29 DIAGNOSIS — T8140XA Infection following a procedure, unspecified, initial encounter: Secondary | ICD-10-CM | POA: Diagnosis not present

## 2018-06-29 DIAGNOSIS — T40605A Adverse effect of unspecified narcotics, initial encounter: Secondary | ICD-10-CM | POA: Diagnosis not present

## 2018-06-29 DIAGNOSIS — R52 Pain, unspecified: Secondary | ICD-10-CM | POA: Diagnosis not present

## 2018-06-29 DIAGNOSIS — Z1611 Resistance to penicillins: Secondary | ICD-10-CM | POA: Diagnosis not present

## 2018-06-29 DIAGNOSIS — Z981 Arthrodesis status: Secondary | ICD-10-CM

## 2018-06-29 DIAGNOSIS — Z91048 Other nonmedicinal substance allergy status: Secondary | ICD-10-CM | POA: Diagnosis not present

## 2018-06-29 DIAGNOSIS — Z8249 Family history of ischemic heart disease and other diseases of the circulatory system: Secondary | ICD-10-CM | POA: Diagnosis not present

## 2018-06-29 DIAGNOSIS — R05 Cough: Secondary | ICD-10-CM | POA: Diagnosis not present

## 2018-06-29 DIAGNOSIS — R0602 Shortness of breath: Secondary | ICD-10-CM | POA: Diagnosis not present

## 2018-06-29 DIAGNOSIS — G893 Neoplasm related pain (acute) (chronic): Secondary | ICD-10-CM | POA: Diagnosis present

## 2018-06-29 DIAGNOSIS — M8458XD Pathological fracture in neoplastic disease, other specified site, subsequent encounter for fracture with routine healing: Secondary | ICD-10-CM | POA: Diagnosis not present

## 2018-06-29 DIAGNOSIS — D62 Acute posthemorrhagic anemia: Secondary | ICD-10-CM | POA: Diagnosis present

## 2018-06-29 DIAGNOSIS — D72829 Elevated white blood cell count, unspecified: Secondary | ICD-10-CM

## 2018-06-29 DIAGNOSIS — S31000A Unspecified open wound of lower back and pelvis without penetration into retroperitoneum, initial encounter: Secondary | ICD-10-CM | POA: Diagnosis not present

## 2018-06-29 DIAGNOSIS — K769 Liver disease, unspecified: Secondary | ICD-10-CM | POA: Diagnosis not present

## 2018-06-29 DIAGNOSIS — T8142XD Infection following a procedure, deep incisional surgical site, subsequent encounter: Secondary | ICD-10-CM | POA: Diagnosis not present

## 2018-06-29 DIAGNOSIS — Z7189 Other specified counseling: Secondary | ICD-10-CM | POA: Diagnosis not present

## 2018-06-29 DIAGNOSIS — I824Y9 Acute embolism and thrombosis of unspecified deep veins of unspecified proximal lower extremity: Secondary | ICD-10-CM | POA: Diagnosis not present

## 2018-06-29 DIAGNOSIS — Z888 Allergy status to other drugs, medicaments and biological substances status: Secondary | ICD-10-CM | POA: Diagnosis not present

## 2018-06-29 DIAGNOSIS — C3491 Malignant neoplasm of unspecified part of right bronchus or lung: Secondary | ICD-10-CM

## 2018-06-29 DIAGNOSIS — M1990 Unspecified osteoarthritis, unspecified site: Secondary | ICD-10-CM | POA: Diagnosis not present

## 2018-06-29 DIAGNOSIS — R609 Edema, unspecified: Secondary | ICD-10-CM

## 2018-06-29 DIAGNOSIS — R4702 Dysphasia: Secondary | ICD-10-CM | POA: Diagnosis not present

## 2018-06-29 DIAGNOSIS — M545 Low back pain: Secondary | ICD-10-CM | POA: Diagnosis not present

## 2018-06-29 DIAGNOSIS — J439 Emphysema, unspecified: Secondary | ICD-10-CM | POA: Diagnosis present

## 2018-06-29 DIAGNOSIS — R197 Diarrhea, unspecified: Secondary | ICD-10-CM | POA: Diagnosis not present

## 2018-06-29 DIAGNOSIS — Z6827 Body mass index (BMI) 27.0-27.9, adult: Secondary | ICD-10-CM

## 2018-06-29 DIAGNOSIS — Z881 Allergy status to other antibiotic agents status: Secondary | ICD-10-CM | POA: Diagnosis not present

## 2018-06-29 DIAGNOSIS — T8141XA Infection following a procedure, superficial incisional surgical site, initial encounter: Secondary | ICD-10-CM | POA: Diagnosis not present

## 2018-06-29 DIAGNOSIS — C3490 Malignant neoplasm of unspecified part of unspecified bronchus or lung: Secondary | ICD-10-CM | POA: Diagnosis present

## 2018-06-29 DIAGNOSIS — C7951 Secondary malignant neoplasm of bone: Secondary | ICD-10-CM | POA: Diagnosis present

## 2018-06-29 DIAGNOSIS — M8468XA Pathological fracture in other disease, other site, initial encounter for fracture: Secondary | ICD-10-CM | POA: Diagnosis not present

## 2018-06-29 DIAGNOSIS — R21 Rash and other nonspecific skin eruption: Secondary | ICD-10-CM | POA: Diagnosis present

## 2018-06-29 DIAGNOSIS — B961 Klebsiella pneumoniae [K. pneumoniae] as the cause of diseases classified elsewhere: Secondary | ICD-10-CM | POA: Diagnosis not present

## 2018-06-29 DIAGNOSIS — M5416 Radiculopathy, lumbar region: Secondary | ICD-10-CM | POA: Diagnosis not present

## 2018-06-29 DIAGNOSIS — M792 Neuralgia and neuritis, unspecified: Secondary | ICD-10-CM

## 2018-06-29 DIAGNOSIS — Z66 Do not resuscitate: Secondary | ICD-10-CM | POA: Diagnosis not present

## 2018-06-29 DIAGNOSIS — M1611 Unilateral primary osteoarthritis, right hip: Secondary | ICD-10-CM | POA: Diagnosis not present

## 2018-06-29 DIAGNOSIS — I951 Orthostatic hypotension: Secondary | ICD-10-CM | POA: Diagnosis not present

## 2018-06-29 DIAGNOSIS — C3431 Malignant neoplasm of lower lobe, right bronchus or lung: Secondary | ICD-10-CM | POA: Diagnosis present

## 2018-06-29 DIAGNOSIS — C3400 Malignant neoplasm of unspecified main bronchus: Secondary | ICD-10-CM | POA: Diagnosis not present

## 2018-06-29 DIAGNOSIS — Z515 Encounter for palliative care: Secondary | ICD-10-CM

## 2018-06-29 DIAGNOSIS — T8142XA Infection following a procedure, deep incisional surgical site, initial encounter: Secondary | ICD-10-CM | POA: Diagnosis not present

## 2018-06-29 DIAGNOSIS — J80 Acute respiratory distress syndrome: Secondary | ICD-10-CM | POA: Diagnosis not present

## 2018-06-29 DIAGNOSIS — Z7982 Long term (current) use of aspirin: Secondary | ICD-10-CM

## 2018-06-29 DIAGNOSIS — K661 Hemoperitoneum: Secondary | ICD-10-CM | POA: Diagnosis present

## 2018-06-29 DIAGNOSIS — R279 Unspecified lack of coordination: Secondary | ICD-10-CM | POA: Diagnosis not present

## 2018-06-29 DIAGNOSIS — D492 Neoplasm of unspecified behavior of bone, soft tissue, and skin: Secondary | ICD-10-CM | POA: Diagnosis not present

## 2018-06-29 DIAGNOSIS — C801 Malignant (primary) neoplasm, unspecified: Secondary | ICD-10-CM | POA: Diagnosis not present

## 2018-06-29 DIAGNOSIS — Z833 Family history of diabetes mellitus: Secondary | ICD-10-CM

## 2018-06-29 DIAGNOSIS — Z720 Tobacco use: Secondary | ICD-10-CM | POA: Diagnosis not present

## 2018-06-29 DIAGNOSIS — Z885 Allergy status to narcotic agent status: Secondary | ICD-10-CM | POA: Diagnosis not present

## 2018-06-29 DIAGNOSIS — M25511 Pain in right shoulder: Secondary | ICD-10-CM | POA: Diagnosis not present

## 2018-06-29 DIAGNOSIS — R918 Other nonspecific abnormal finding of lung field: Secondary | ICD-10-CM | POA: Diagnosis not present

## 2018-06-29 DIAGNOSIS — C7989 Secondary malignant neoplasm of other specified sites: Secondary | ICD-10-CM | POA: Diagnosis present

## 2018-06-29 DIAGNOSIS — C493 Malignant neoplasm of connective and soft tissue of thorax: Secondary | ICD-10-CM | POA: Diagnosis not present

## 2018-06-29 DIAGNOSIS — J449 Chronic obstructive pulmonary disease, unspecified: Secondary | ICD-10-CM | POA: Diagnosis not present

## 2018-06-29 DIAGNOSIS — Z743 Need for continuous supervision: Secondary | ICD-10-CM | POA: Diagnosis not present

## 2018-06-29 DIAGNOSIS — I82409 Acute embolism and thrombosis of unspecified deep veins of unspecified lower extremity: Secondary | ICD-10-CM

## 2018-06-29 DIAGNOSIS — G8918 Other acute postprocedural pain: Secondary | ICD-10-CM | POA: Diagnosis not present

## 2018-06-29 DIAGNOSIS — D638 Anemia in other chronic diseases classified elsewhere: Secondary | ICD-10-CM | POA: Diagnosis present

## 2018-06-29 DIAGNOSIS — L299 Pruritus, unspecified: Secondary | ICD-10-CM | POA: Diagnosis not present

## 2018-06-29 MED ORDER — GUAIFENESIN 100 MG/5ML PO SOLN
10.0000 mL | Freq: Four times a day (QID) | ORAL | Status: DC
  Administered 2018-06-29 – 2018-07-13 (×51): 200 mg via ORAL
  Filled 2018-06-29 (×18): qty 10
  Filled 2018-06-29: qty 20
  Filled 2018-06-29 (×32): qty 10
  Filled 2018-06-29: qty 20
  Filled 2018-06-29: qty 10

## 2018-06-29 MED ORDER — FENTANYL 12 MCG/HR TD PT72
12.5000 ug | MEDICATED_PATCH | TRANSDERMAL | Status: DC
  Administered 2018-06-30 – 2018-07-03 (×2): 12.5 ug via TRANSDERMAL
  Filled 2018-06-29 (×3): qty 1

## 2018-06-29 MED ORDER — HYDROMORPHONE HCL 1 MG/ML PO LIQD: 1.0000 mg | ORAL | 0 refills | Status: DC | PRN

## 2018-06-29 MED ORDER — DIPHENHYDRAMINE HCL 12.5 MG/5ML PO ELIX
12.5000 mg | ORAL_SOLUTION | Freq: Four times a day (QID) | ORAL | Status: DC | PRN
  Administered 2018-07-07: 25 mg via ORAL
  Administered 2018-07-07: 12.5 mg via ORAL
  Administered 2018-07-09: 25 mg via ORAL
  Filled 2018-06-29 (×3): qty 10

## 2018-06-29 MED ORDER — ACETAMINOPHEN 650 MG RE SUPP: 650.0000 mg | RECTAL | 0 refills | Status: DC | PRN

## 2018-06-29 MED ORDER — PRO-STAT SUGAR FREE PO LIQD
30.0000 mL | Freq: Two times a day (BID) | ORAL | Status: DC
  Administered 2018-06-29 – 2018-07-13 (×26): 30 mL via ORAL
  Filled 2018-06-29 (×27): qty 30

## 2018-06-29 MED ORDER — ALUM & MAG HYDROXIDE-SIMETH 200-200-20 MG/5ML PO SUSP: 30.0000 mL | ORAL | Status: DC | PRN

## 2018-06-29 MED ORDER — GUAIFENESIN-DM 100-10 MG/5ML PO SYRP
5.0000 mL | ORAL_SOLUTION | Freq: Four times a day (QID) | ORAL | Status: DC | PRN
  Filled 2018-06-29: qty 5

## 2018-06-29 MED ORDER — HYDROCODONE-ACETAMINOPHEN 7.5-325 MG PO TABS: 1.0000 | ORAL_TABLET | Freq: Three times a day (TID) | ORAL | 0 refills | Status: DC | PRN

## 2018-06-29 MED ORDER — PANTOPRAZOLE SODIUM 40 MG PO TBEC
40.0000 mg | DELAYED_RELEASE_TABLET | Freq: Every day | ORAL | Status: DC
  Administered 2018-06-29 – 2018-07-12 (×13): 40 mg via ORAL
  Filled 2018-06-29 (×13): qty 1

## 2018-06-29 MED ORDER — CAMPHOR-MENTHOL 0.5-0.5 % EX LOTN
TOPICAL_LOTION | CUTANEOUS | Status: DC | PRN
  Administered 2018-07-09: 08:00:00 via TOPICAL
  Filled 2018-06-29: qty 222

## 2018-06-29 MED ORDER — CLOBETASOL PROPIONATE 0.05 % EX OINT
TOPICAL_OINTMENT | Freq: Two times a day (BID) | CUTANEOUS | Status: DC
  Administered 2018-06-29 – 2018-07-08 (×16): via TOPICAL
  Filled 2018-06-29 (×4): qty 15

## 2018-06-29 MED ORDER — GABAPENTIN 300 MG PO CAPS: 300.0000 mg | ORAL_CAPSULE | Freq: Three times a day (TID) | ORAL | 0 refills | Status: DC

## 2018-06-29 MED ORDER — TRAZODONE HCL 50 MG PO TABS
25.0000 mg | ORAL_TABLET | Freq: Every evening | ORAL | Status: DC | PRN
  Filled 2018-06-29 (×2): qty 1

## 2018-06-29 MED ORDER — HYDROMORPHONE HCL 1 MG/ML PO LIQD
1.0000 mg | ORAL | Status: DC | PRN
  Administered 2018-06-29 – 2018-06-30 (×2): 1 mg via ORAL
  Filled 2018-06-29 (×2): qty 1

## 2018-06-29 MED ORDER — MENTHOL 3 MG MT LOZG: 1.0000 | LOZENGE | OROMUCOSAL | 12 refills | Status: AC | PRN

## 2018-06-29 MED ORDER — POLYETHYLENE GLYCOL 3350 17 G PO PACK: 17.0000 g | PACK | Freq: Every day | ORAL | 0 refills | Status: DC

## 2018-06-29 MED ORDER — GUAIFENESIN 100 MG/5ML PO SOLN: 10.0000 mL | Freq: Four times a day (QID) | ORAL | 0 refills | Status: AC

## 2018-06-29 MED ORDER — METHOCARBAMOL 500 MG PO TABS: 500.0000 mg | ORAL_TABLET | Freq: Three times a day (TID) | ORAL | 0 refills | Status: DC | PRN

## 2018-06-29 MED ORDER — FENTANYL 12 MCG/HR TD PT72: 12.5000 ug | MEDICATED_PATCH | TRANSDERMAL | 0 refills | Status: DC

## 2018-06-29 MED ORDER — ALBUTEROL SULFATE (2.5 MG/3ML) 0.083% IN NEBU: 2.5000 mg | INHALATION_SOLUTION | Freq: Four times a day (QID) | RESPIRATORY_TRACT | Status: DC | PRN

## 2018-06-29 MED ORDER — GABAPENTIN 300 MG PO CAPS
300.0000 mg | ORAL_CAPSULE | Freq: Three times a day (TID) | ORAL | Status: DC
  Administered 2018-06-29 – 2018-07-13 (×40): 300 mg via ORAL
  Filled 2018-06-29 (×40): qty 1

## 2018-06-29 MED ORDER — ACETAMINOPHEN 325 MG PO TABS
325.0000 mg | ORAL_TABLET | ORAL | Status: DC | PRN
  Administered 2018-06-30 (×2): 650 mg via ORAL
  Administered 2018-07-01: 325 mg via ORAL
  Administered 2018-07-01 – 2018-07-05 (×13): 650 mg via ORAL
  Filled 2018-06-29 (×2): qty 2
  Filled 2018-06-29: qty 1
  Filled 2018-06-29 (×15): qty 2

## 2018-06-29 MED ORDER — BISACODYL 10 MG RE SUPP: 10.0000 mg | Freq: Every day | RECTAL | Status: DC | PRN

## 2018-06-29 MED ORDER — FLEET ENEMA 7-19 GM/118ML RE ENEM: 1.0000 | ENEMA | Freq: Once | RECTAL | Status: DC | PRN

## 2018-06-29 MED ORDER — PROCHLORPERAZINE EDISYLATE 10 MG/2ML IJ SOLN: 5.0000 mg | Freq: Four times a day (QID) | INTRAMUSCULAR | Status: DC | PRN

## 2018-06-29 MED ORDER — CLOBETASOL PROPIONATE 0.05 % EX OINT: TOPICAL_OINTMENT | Freq: Two times a day (BID) | CUTANEOUS | 0 refills | Status: DC

## 2018-06-29 MED ORDER — BISACODYL 10 MG RE SUPP: 10.0000 mg | Freq: Every day | RECTAL | 0 refills | Status: DC | PRN

## 2018-06-29 MED ORDER — SENNA 8.6 MG PO TABS
1.0000 | ORAL_TABLET | Freq: Every day | ORAL | Status: DC
  Administered 2018-06-30 – 2018-07-07 (×7): 8.6 mg via ORAL
  Filled 2018-06-29 (×8): qty 1

## 2018-06-29 MED ORDER — PANTOPRAZOLE SODIUM 40 MG PO TBEC: 40.0000 mg | DELAYED_RELEASE_TABLET | Freq: Every day | ORAL | 0 refills | Status: DC

## 2018-06-29 MED ORDER — PROCHLORPERAZINE MALEATE 5 MG PO TABS: 5.0000 mg | ORAL_TABLET | Freq: Four times a day (QID) | ORAL | Status: DC | PRN

## 2018-06-29 MED ORDER — HYDROCODONE-ACETAMINOPHEN 7.5-325 MG PO TABS: 1.0000 | ORAL_TABLET | Freq: Three times a day (TID) | ORAL | Status: DC | PRN

## 2018-06-29 MED ORDER — PROCHLORPERAZINE 25 MG RE SUPP: 12.5000 mg | Freq: Four times a day (QID) | RECTAL | Status: DC | PRN

## 2018-06-29 MED ORDER — POLYETHYLENE GLYCOL 3350 17 G PO PACK
17.0000 g | PACK | Freq: Every day | ORAL | Status: DC | PRN
  Administered 2018-07-08 – 2018-07-11 (×2): 17 g via ORAL
  Filled 2018-06-29 (×3): qty 1

## 2018-06-29 MED ORDER — METHOCARBAMOL 500 MG PO TABS
500.0000 mg | ORAL_TABLET | Freq: Four times a day (QID) | ORAL | Status: DC | PRN
  Administered 2018-06-29 – 2018-07-06 (×19): 500 mg via ORAL
  Filled 2018-06-29 (×19): qty 1

## 2018-06-29 MED ORDER — ALBUTEROL SULFATE (2.5 MG/3ML) 0.083% IN NEBU: 2.5000 mg | INHALATION_SOLUTION | Freq: Four times a day (QID) | RESPIRATORY_TRACT | 12 refills | Status: DC | PRN

## 2018-06-29 MED ORDER — ENSURE ENLIVE PO LIQD: 237.0000 mL | Freq: Two times a day (BID) | ORAL | 12 refills | Status: DC

## 2018-06-29 NOTE — H&P (Signed)
Physical Medicine and Rehabilitation Admission H&P    CC: Pathologic fracture L4 with functional deficits.    HPI: Brian [name] is a 72 year old male with history of RA, emphysema, tobacco abuse with low back and hip pain.  Work-up done revealing bronchogenic RLL lung cancer with bony mets to left clavicle, right eighth rib, L4 vertebral body with fracture and 2.5 cm liver lesion.  He was admitted from office for work-up on 101/08/2017 and surgical intervention of pathological fracture recommended by Dr. Marin Olp.  He underwent L4 corpectomy with L3-L5 fusion by Dr. Rolena Infante on 12/20.  Perioperatively had blood loss anemia with hematoma at corpectomy site requiring 4 units PRBC and 2 units FFP.  Surgery stopped due to bleeding and CT abdomen/pelvis revealed small retroperitoneal hematoma extending to psoas muscle.  Once stabilized, he was taken back to the OR on 12/12 for screw fixation L3-L5.  Pathology revealed metastatic squamous cell lung cancer with plans for HRT in future.  He continued to have issues with severe pain as well as bilateral lower extremity weakness.  Follow up MRI spine 12/27 revealed retroperitoneal hematoma, s/p corpectomy with L3-5 fusion and unchanged severe spinal canal stenosis L4 level. He has required multiple units packed red blood cells as well as IV iron for anemia.  Follow-up CT lumbar spine 1/1  revealed large right and small left retroperitoneal hematomas expanding psoas muscle--unchanged. Rash on back stable. Palliative care consulted for symptom management as well as GOC. Fentanyl and dilaudid added for pain control and due to ongoing issues with neuropathy was taken back to OR on 06/24/18  for L4/5 Gill decompression.  He has had improvement in pain control and mobility and CIR recommended due to functional deficits.    Review of Systems  Constitutional: Positive for malaise/fatigue. Negative for chills and fever.  HENT: Negative for tinnitus.   Eyes: Negative  for blurred vision and double vision.  Respiratory: Positive for cough. Negative for sputum production and shortness of breath.   Cardiovascular: Positive for chest pain (right lateral chest).  Gastrointestinal: Negative for abdominal pain, constipation and vomiting.  Genitourinary: Negative for dysuria and urgency.  Musculoskeletal: Positive for back pain, joint pain (left clavicle region) and myalgias.  Skin: Positive for rash. Negative for itching.  Neurological: Positive for sensory change (RLE > LLE numbness ) and weakness.  Psychiatric/Behavioral: The patient has insomnia.      Past Medical History:  Diagnosis Date  . Arthritis     Past Surgical History:  Procedure Laterality Date  . ANTERIOR CERVICAL CORPECTOMY Left 06/11/2018   Procedure: L4 Corpectomy, L3-5 anterior reconstruction,;  Surgeon: Melina Schools, MD;  Location: [facility];  Service: Orthopedics;  Laterality: Left;  6.5 hrs- Ok for this day/time per April  . CATARACT EXTRACTION, BILATERAL    . DECOMPRESSIVE LUMBAR LAMINECTOMY LEVEL 1 N/A 06/24/2018   Procedure: DECOMPRESSIVE LUMBAR 4-5 LAMINECTOMY LEVEL 1;  Surgeon: Melina Schools, MD;  Location: [facility];  Service: Orthopedics;  Laterality: N/A;    Family History  Problem Relation Age of Onset  . Diabetes Mother   . Heart attack Father   . Obesity Father   . Diabetes Sister     Social History: Married. Retired Engineer, maintenance (IT). Independent with use of walker for past few months due to back pain. He reports that he has been smoking cigarettes. He has been smoking about 0.25 packs per day. He has never used smokeless tobacco. He reports that he does not use drugs. No history on file  for alcohol.    Allergies  Allergen Reactions  . Minoxidil Palpitations  . Nicotine Palpitations  . Sulfamethoxazole Other (See Comments)    Both parents allergic, avoids med    Medications Prior to Admission  Medication Sig Dispense Refill  . aspirin 81 MG chewable tablet Chew 81 mg by mouth  daily.    . Calcium Carb-Cholecalciferol (CALCIUM 600/VITAMIN D3) 600-800 MG-UNIT TABS Take 1 tablet by mouth daily.    Marland Kitchen docusate sodium (COLACE) 100 MG capsule Take 100 mg by mouth 2 (two) times daily as needed for mild constipation.    . folic acid (FOLVITE) 1 MG tablet Take 1 mg by mouth daily.    . Homeopathic Products (T-RELIEF PAIN RELIEF) CREA Apply 1 application topically 2 (two) times daily. Hip/Back pain    . HYDROmorphone (DILAUDID) 4 MG tablet Take 4 mg by mouth every 6 (six) hours as needed for severe pain.    . hydroxypropyl methylcellulose / hypromellose (ISOPTO TEARS / GONIOVISC) 2.5 % ophthalmic solution Place 1-2 drops into both eyes as needed for dry eyes.    Marland Kitchen ibuprofen (ADVIL,MOTRIN) 200 MG tablet Take 400 mg by mouth every 6 (six) hours as needed for headache or mild pain.    Marland Kitchen Lysine 500 MG CAPS Take 500 mg by mouth daily as needed (chapped lips).    . Omega-3 350 MG CPDR Take 350 mg by mouth daily.    . Potassium 99 MG TABS Take 99 mg by mouth daily as needed (leg cramps).    . pseudoephedrine (SUDAFED) 120 MG 12 hr tablet Take 120 mg by mouth every 12 (twelve) hours as needed for congestion.      Drug Regimen Review  Drug regimen was reviewed and remains appropriate with no significant issues identified  Home: Home Living Family/patient expects to be discharged to:: Inpatient rehab Living Arrangements: Spouse/significant other Available Help at Discharge: Family, Available 24 hours/day Type of Home: House Home Access: Stairs to enter [employer] of Steps: 6 Entrance Stairs-Rails: Right Home Layout: One level Bathroom Shower/Tub: Multimedia programmer: Standard Home Equipment: Environmental consultant - 2 wheels, Grab bars - tub/shower, Shower seat, Hand held shower head, Financial controller: Reacher Additional Comments: two cats- within the home but 10 total in the house but are in a special room in the house   Functional History: Prior  Function Level of Independence: Needs assistance Gait / Transfers Assistance Needed: walker at baseline ADL's / Homemaking Assistance Needed: sitting with wife helping with back and buttock  Functional Status:  Mobility: Bed Mobility Overal bed mobility: Needs Assistance Bed Mobility: Rolling, Sidelying to Sit Rolling: Min guard Sidelying to sit: Min assist Supine to sit: Max assist Sit to supine: Max assist, +2 for physical assistance Sit to sidelying: Mod assist General bed mobility comments: minA for trunk elevation, verbal cues for log roll and not twisting Transfers Overall transfer level: Needs assistance Equipment used: Rolling walker (2 wheeled) Transfer via Lift Equipment: Stedy Transfers: Sit to/from [employer] to Stand: Min assist, Mod assist, +2 physical assistance Stand pivot transfers: (Used Stedy for bed to chair) General transfer comment: verbal cues to push up from bed, min/modA to power up and steady during transition of hands from bed to RW Ambulation/Gait Ambulation/Gait assistance: Min assist, +2 safety/equipment Gait Distance (Feet): 100 Feet(x2) Assistive device: Rolling walker (2 wheeled) Gait Pattern/deviations: Step-through pattern, Decreased step length - right, Decreased step length - left, Trunk flexed General Gait Details: c/o fatigue however improved from  yesterday Gait velocity: dec Gait velocity interpretation: <1.31 ft/sec, indicative of household ambulator    ADL: ADL Overall ADL's : Needs assistance/impaired Eating/Feeding: Set up Grooming: Minimal assistance, Standing, Wash/dry hands, Wash/dry face Grooming Details (indicate cue type and reason): cueing to maintain posture and precautions, requires 1 hand support Upper Body Bathing: Moderate assistance Lower Body Bathing: Total assistance Upper Body Dressing : Maximal assistance Lower Body Dressing: Maximal assistance, Sitting/lateral leans, +2 for physical assistance Lower Body Dressing  Details (indicate cue type and reason): initated educated on AE, demosntrating ability to don/doff B socks using reacher/sock aide with supervision, min cueing for precautions Toilet Transfer: Moderate assistance, +2 for physical assistance, RW, Ambulation Toilet Transfer Details (indicate cue type and reason): simulated, cueing for hand placement and safety  Toileting- Clothing Manipulation and Hygiene: Total assistance, +2 for physical assistance Toileting - Clothing Manipulation Details (indicate cue type and reason): +2 assist to squat in stedy in order to attempt cleaning after incontinent BM Functional mobility during ADLs: Moderate assistance, +2 for physical assistance General ADL Comments: attempted stedy for transfer, pt fatigue and unable to raise hips off chair more than 20 degrees given max assist +2   Cognition: Cognition Overall Cognitive Status: Within Functional Limits for tasks assessed Orientation Level: Oriented X4 Cognition Arousal/Alertness: Awake/alert Behavior During Therapy: WFL for tasks assessed/performed Overall Cognitive Status: Within Functional Limits for tasks assessed General Comments: patient with depressed spirits, frustrated throughout session   Blood pressure 125/82, pulse 100, temperature 98.7 F (37.1 C), temperature source Axillary, resp. rate 16, height 5\' 10"  (1.778 m), weight 93.8 kg, SpO2 95 %. Physical Exam  Nursing note and vitals reviewed. Constitutional: He is oriented to person, place, and time. He appears well-developed and well-nourished.  HENT:  Head: Normocephalic.  Eyes: Pupils are equal, round, and reactive to light.  Neck: Normal range of motion.  Cardiovascular: Normal rate and regular rhythm. Exam reveals no friction rub.  No murmur heard. Respiratory: Effort normal and breath sounds normal. No respiratory distress. He has no wheezes. He has no rales.  GI: Soft. He exhibits no distension. There is no abdominal tenderness. There  is no rebound.  Musculoskeletal:        General: Edema present.     Comments: 2+ pitting edema on mid back, flank and upper thighs.   Neurological: He is alert and oriented to person, place, and time.  Dysphonia noted. Able to follow basic commands without difficulty. UE 4/5, LE: 3+ to 4/5HF, 4/5 KE and 4/5 ADF/PF. No focal sensory findings.   Skin: Skin is warm and dry. Rash noted.  Patches of rash on back due to contact dermatitis. Ecchymosis right flank? Lower back and left flank area with foam dressings in place with underlying wounds c/generally dry/intact  Psychiatric: His behavior is normal. Judgment and thought content normal. His affect is blunt.    No results found for this or any previous visit (from the past 48 hour(s)). No results found.     Medical Problem List and Plan: 1.  Functional and mobility deficits secondary to metastatic lung cancer to left clavicle, ribs, L4 vertebral body with fracture. Pt is s/p L4 corpectomy and L3-5 fusion by Dr. Rolena Infante on 06/11/18  -admit to inpatient rehab 2.  DVT Prophylaxis/Anticoagulation:   3. Pain Management: Continue Fentanyl 12.5 mcg with norco/dilaudid prn. Unable to tolerate Oxycodone. Monitor patient's pain with increased activity.  4. Mood: LCSW to follow for evaluation and support.  5. Neuropsych: This patient is capable of  making decisions on his own behalf. 6. Skin/Wound Care: Monitor wound for healing. Has been refusing ensure--will add prostat to help promote wound healing. 7. Fluids/Electrolytes/Nutrition: Monitor I/O. Check lytes  In am.  8. Blood pressure; Intermittent elevation noted --monitor for now.   9. ABLA: Continue to monitor with serial checks.  10.  Intermittent Hypokalemia: Recheck labs in am. Check Magnesium levels as boderline normal.  11. Rash: Continue Clobetasol bid as well as hypoallergenic sheets.  12. Neuropathy: Improving--on gabapentin 300 mg tid 13. Constipation: Managed with miralax prn--patient  wants to manage his bowel program.    Post Admission Physician Evaluation: 1. Functional deficits secondary  to metastatic lung cancer to spine. 2. Patient is admitted to receive collaborative, interdisciplinary care between the physiatrist, rehab nursing staff, and therapy team. 3. Patient's level of medical complexity and substantial therapy needs in context of that medical necessity cannot be provided at a lesser intensity of care such as a SNF. 4. Patient has experienced substantial functional loss from his/her baseline which was documented above under the "Functional History" and "Functional Status" headings.  Judging by the patient's diagnosis, physical exam, and functional history, the patient has potential for functional progress which will result in measurable gains while on inpatient rehab.  These gains will be of substantial and practical use upon discharge  in facilitating mobility and self-care at the household level. 5. Physiatrist will provide 24 hour management of medical needs as well as oversight of the therapy plan/treatment and provide guidance as appropriate regarding the interaction of the two. 6. The Preadmission Screening has been reviewed and patient status is unchanged unless otherwise stated above. 7. 24 hour rehab nursing will assist with bladder management, bowel management, safety, skin/wound care, disease management, medication administration, pain management and patient education  and help integrate therapy concepts, techniques,education, etc. 8. PT will assess and treat for/with: Lower extremity strength, range of motion, stamina, balance, functional mobility, safety, adaptive techniques and equipment, pain mgt, ortho precautions, NMR.   Goals are: supervision to min assist. 9. OT will assess and treat for/with: ADL's, functional mobility, safety, upper extremity strength, adaptive techniques and equipment, pain mgt, don/doffing of TLSO, family education.   Goals are:  supervision to min assist. Therapy may proceed with showering this patient. 10. SLP will assess and treat for/with: n/a.  Goals are: n/a. 11. Case Management and Social Worker will assess and treat for psychological issues and discharge planning. 12. Team conference will be held weekly to assess progress toward goals and to determine barriers to discharge. 13. Patient will receive at least 3 hours of therapy per day at least 5 days per week. 14. ELOS: 15-20        15. Prognosis:  good   I have personally performed a face to face diagnostic evaluation of this patient and formulated the key components of the plan.  Additionally, I have personally reviewed laboratory data, imaging studies, as well as relevant notes and concur with the physician assistant's documentation above.  Meredith Staggers, MD, [facility] Drown    Bary Leriche, PA-C 06/29/2018

## 2018-06-29 NOTE — Progress Notes (Signed)
PMR Admission Coordinator Pre-Admission Assessment  Patient: Brian [name] is an 73 y.o., male MRN: [id number] DOB: 02/13/1946 Height: '5\' 10"'$  (177.8 cm) Weight: 93.8 kg                                                                                                                                                  Insurance Information HMO:     PPO:      PCP:      IPA:      80/20:  yes   OTHER:  PRIMARY:   Medicare Part A and B    Policy#: [id number]      Subscriber: Patient CM Name:       Phone#:      Fax#:  Pre-Cert#:       Employer:  Benefits:  Phone #: Automated system     Name: Verified eligibility on 06/28/18 via [employer] Eff. Date: part A effective: 05/23/10; Part B effective 05/23/10     Deduct: $1,408      Out of Pocket Max: NA      Life Max: NA CIR:  Covered per Medicare guidelines once yearly deductible is met.      SNF: days 1-20, 100%; days 21-100, 80% Outpatient: 80%     Co-Pay: 20% Home Health: 100%      Co-Pay:  DME: 80%     Co-Pay: 20% Providers: Pt's choice SECONDARY: BCBS      Policy#:  [id number]     Subscriber: Patient CM Name:       Phone#:      Fax#:  Pre-Cert#:       Employer:  Benefits:  Phone #: [phone]     Name:  Eff. Date:      Deduct:       Out of Pocket Max:       Life Max:  CIR:       SNF:  Outpatient:      Co-Pay:  Home Health:       Co-Pay:  DME:      Co-Pay:   Medicaid Application Date:       Case Manager: Disability Application Date:       Case Worker:   Emergency Contact Information         Contact Information    Name Relation Home Work Higginson Spouse [phone]  [phone]     Current Medical History  Patient Admitting Diagnosis: Functional deficits secondary to diffusely metastatic lung cancer which has involved L4 vertebral body. Pt underwent L4 corpectomy and fusion.  History of Present Illness: Brian [name] Jris a 73 y.o.malewith history of RA, emphysema, tobacco abuse with low  back and hip pain. Work up done revealing of bronchogenic RLL lung cancer with bony mets to left clavicle, right 8th rib, L4 vertebral body with fracture and  2.5 cm liver lesion.He was admitted form office for work up on 06/06/18 and surgical intervention of pathologic fracture recommended by Dr. Marin Olp. He underwent L4 corpectomy with L3-L5 anterior fusion by Dr.Brooks on 12/20.Perioperatively with blood loss with hematoma at corpectomy site requiring 4 units PRBC and 2 units FFP. Surgery stopped due to bleeding andCTA abdomen/pelvis revealed small retroperitoneal hematoma win right lateral spine extending to psoas muscle.Once stabilized, he was taken back to OR on 12/22 for screw fixation L3 - L5.  Pathology reveledmetastaticsquamous cell lung cancer and plans for XRT in the future. H/H being monitored and he continues on full liquid diet due to constipation. Post op with BLE weakness as well as pain affecting mobility and self care tasks.CIR recommended due to functional deficits.  Pt had acute onset of right thigh spasm and pain on 12/27 with MRI revealing possible mass effect from hematoma on the psoas. Pt has provided with palliative care consult for overall pain management. Pt has LE weakness with orthopedics deciding on doing posterior gill decompression on 1/02. Per Dr. Rolena Infante, the plan is for pt to be followed by oncology and once the wounds have healed, the patient will have radiation therapy to the left clavicle and right seventh rib. Post op, pt appears to be doing better and progressing with therapies. CIR still recommended due to continued functional deficits.    Past Medical History      Past Medical History:  Diagnosis Date  . Arthritis     Family History  family history includes Diabetes in his mother and sister; Heart attack in his father; Obesity in his father.  Prior Rehab/Hospitalizations:  Has the patient had major surgery during 100 days prior to  admission? No  Current Medications   Current Facility-Administered Medications:  .  0.9 %  sodium chloride infusion, 250 mL, Intravenous, Continuous, Melina Schools, MD, Last Rate: 1 mL/hr at 06/24/18 1521, 250 mL at 06/24/18 1521 .  acetaminophen (TYLENOL) tablet 650 mg, 650 mg, Oral, Q4H PRN, 650 mg at 06/29/18 0127 **OR** acetaminophen (TYLENOL) suppository 650 mg, 650 mg, Rectal, Q4H PRN, Melina Schools, MD .  albuterol (PROVENTIL) (2.5 MG/3ML) 0.083% nebulizer solution 2.5 mg, 2.5 mg, Nebulization, Q6H PRN, Melina Schools, MD, 2.5 mg at 06/18/18 1317 .  bisacodyl (DULCOLAX) suppository 10 mg, 10 mg, Rectal, Daily PRN, Samuella Cota, MD, 10 mg at 06/25/18 1443 .  clobetasol ointment (TEMOVATE) 0.05 %, , Topical, BID, Rai, Ripudeep K, MD .  feeding supplement (ENSURE ENLIVE) (ENSURE ENLIVE) liquid 237 mL, 237 mL, Oral, BID BM, Melina Schools, MD, 237 mL at 06/27/18 1018 .  fentaNYL (DURAGESIC - dosed mcg/hr) 12.5 mcg, 12.5 mcg, Transdermal, Q72H, Basilio Cairo, NP, 12.5 mcg at 06/27/18 1707 .  gabapentin (NEURONTIN) capsule 300 mg, 300 mg, Oral, TID, Melina Schools, MD, 300 mg at 06/29/18 0919 .  guaiFENesin (ROBITUSSIN) 100 MG/5ML solution 200 mg, 10 mL, Oral, Q6H, Melina Schools, MD, 200 mg at 06/29/18 1155 .  HYDROcodone-acetaminophen (NORCO) 7.5-325 MG per tablet 1 tablet, 1 tablet, Oral, Q8H PRN, Constable, Amber, PA-C, 1 tablet at 06/29/18 0919 .  HYDROmorphone HCl (DILAUDID) liquid 1 mg, 1 mg, Oral, Q4H PRN, Basilio Cairo, NP, 1 mg at 06/29/18 1152 .  menthol-cetylpyridinium (CEPACOL) lozenge 3 mg, 1 lozenge, Oral, PRN **OR** phenol (CHLORASEPTIC) mouth spray 1 spray, 1 spray, Mouth/Throat, PRN, Melina Schools, MD .  methocarbamol (ROBAXIN) tablet 500 mg, 500 mg, Oral, Q6H PRN, 500 mg at 06/29/18 1151 **OR** [DISCONTINUED] methocarbamol (ROBAXIN) 500  mg in dextrose 5 % 50 mL IVPB, 500 mg, Intravenous, Q6H PRN, Melina Schools, MD, Last Rate: 100 mL/hr at 06/25/18 0716, 500 mg at  06/25/18 0716 .  ondansetron (ZOFRAN) tablet 4 mg, 4 mg, Oral, Q6H PRN **OR** [DISCONTINUED] ondansetron (ZOFRAN) injection 4 mg, 4 mg, Intravenous, Q6H PRN, Melina Schools, MD .  pantoprazole (PROTONIX) EC tablet 40 mg, 40 mg, Oral, Daily, Masters, Jake Church, RPH, 40 mg at 06/28/18 2033 .  polyethylene glycol (MIRALAX / GLYCOLAX) packet 17 g, 17 g, Oral, Daily, Ihor Dow M, NP, 17 g at 06/29/18 0919 .  senna (SENOKOT) tablet 8.6 mg, 1 tablet, Oral, Daily, Ihor Dow M, NP, 8.6 mg at 06/29/18 0919 .  sodium chloride flush (NS) 0.9 % injection 3 mL, 3 mL, Intravenous, Q12H, Melina Schools, MD, 3 mL at 06/29/18 0920 .  sodium chloride flush (NS) 0.9 % injection 3 mL, 3 mL, Intravenous, PRN, Melina Schools, MD .  sodium phosphate (FLEET) 7-19 GM/118ML enema 1 enema, 1 enema, Rectal, Once PRN, Melina Schools, MD  Patients Current Diet:     Diet Order                  Diet regular Room service appropriate? 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   Home Assistive Devices / Equipment Home Assistive Devices/Equipment: Eyeglasses, Radio producer (specify quad or straight), Walker (specify type)(front wheeled walker, single point cane) Home Equipment: Walker - 2 wheels, Grab bars - tub/shower, Shower seat, Hand held shower head, Adaptive equipment  Prior Device Use: Indicate devices/aids used by the patient prior to  current illness, exacerbation or injury? Walker for the past two months during increase in symptoms and pain.   Prior Functional Level Prior Function Level of Independence: Needs assistance Gait / Transfers Assistance Needed: walker at baseline ADL's / Homemaking Assistance Needed: sitting with wife helping with back and buttock  Self Care: Did the patient need help bathing, dressing, using the toilet or eating?  Needed some help; Independent prior to last two months, then required gradually more assistance due to pain.   Indoor Mobility: Did the patient need assistance with walking from room to room (with or without device)? Independent  Stairs: Did the patient need assistance with internal or external stairs (with or without device)? Needed some help  Functional Cognition: Did the patient need help planning regular tasks such as shopping or remembering to take medications? Independent  Current Functional Level Cognition  Overall Cognitive Status: Within Functional Limits for tasks assessed Orientation Level: Oriented X4 General Comments: patient with depressed spirits, frustrated throughout session    Extremity Assessment (includes Sensation/Coordination)  Upper Extremity Assessment: LUE deficits/detail LUE Deficits / Details: edema in L forarm, elbow  Lower Extremity Assessment: RLE deficits/detail, LLE deficits/detail RLE Deficits / Details: edema in R thigh/knee, strength hip flexion 3-/5, knee extension 4/5, ankle DF 4+.5 RLE Sensation: WNL LLE Deficits / Details:  AROM WFL, strength hip flexion 3/5, knee extension 4+/5, ankle DF 4+/5 LLE Sensation: WNL    ADLs  Overall ADL's : Needs assistance/impaired Eating/Feeding: Set up Grooming: Minimal assistance, Standing, Wash/dry hands, Wash/dry face Grooming Details (indicate cue type and reason): cueing to maintain posture and precautions, requires 1 hand support Upper Body Bathing: Moderate assistance Lower Body  Bathing: Total assistance Upper Body Dressing : Maximal assistance Lower Body Dressing: Maximal assistance, Sitting/lateral leans, +2 for physical assistance Lower Body Dressing Details (indicate cue type and reason): initated educated on AE, demosntrating ability to don/doff B socks using reacher/sock aide with supervision, min cueing for precautions Toilet Transfer: Moderate assistance, +2 for physical assistance, RW, Ambulation Toilet Transfer Details (indicate cue type and reason): simulated, cueing for hand placement and safety  Toileting- Clothing Manipulation and Hygiene: Total assistance, +2 for physical assistance Toileting - Clothing Manipulation Details (indicate cue type and reason): +2 assist to squat in stedy in order to attempt cleaning after incontinent BM Functional mobility during ADLs: Moderate assistance, +2 for physical assistance General ADL Comments: attempted stedy for transfer, pt fatigue and unable to raise hips off chair more than 20 degrees given max assist +2     Mobility  Overal bed mobility: Needs Assistance Bed Mobility: Rolling, Sidelying to Sit Rolling: Min guard Sidelying to sit: Min assist Supine to sit: Max assist Sit to supine: Max assist, +2 for physical assistance Sit to sidelying: Mod assist General bed mobility comments: minA for trunk elevation, verbal cues for log roll and not twisting    Transfers  Overall transfer level: Needs assistance Equipment used: Rolling walker (2 wheeled) Transfer via Lift Equipment: Stedy Transfers: Sit to/from [employer] to Stand: Min assist, Mod assist, +2 physical assistance Stand pivot transfers: (Used Stedy for bed to chair) General transfer comment: verbal cues to push up from bed, min/modA to power up and steady during transition of hands from bed to [employer]    Ambulation / Gait / Stairs / Emergency planning/management officer  Ambulation/Gait Ambulation/Gait assistance: Min assist, +2 safety/equipment Gait Distance (Feet):  100 Feet(x2) Assistive device: Rolling walker (2 wheeled) Gait Pattern/deviations: Step-through pattern, Decreased step length - right, Decreased step length - left, Trunk flexed General Gait Details: c/o fatigue however improved from yesterday Gait velocity: dec Gait velocity interpretation: <1.31 ft/sec, indicative of household ambulator    Posture / Balance Dynamic Sitting Balance Sitting balance - Comments: able to tolerate sitting EOB without UE support x 15 sec. sitting balance improving Balance Overall balance assessment: Needs assistance Sitting-balance support: Feet supported Sitting balance-Leahy Scale: Fair Sitting balance - Comments: able to tolerate sitting EOB without UE support x 15 sec. sitting balance improving Standing balance support: Single extremity supported, During functional activity Standing balance-Leahy Scale: Fair Standing balance comment: dependent on RW    Special needs/care consideration BiPAP/CPAP: no CPM: no Continuous Drip IV: 0.9% sodium chloride infusion  Dialysis: no        Days: no Life Vest: no Oxygen: no, RA Special Bed: no Trach Size: no Wound Vac (area): no      Location:no Skin: surgical incision to back and flank; rash to back and abdomen Bowel mgmt:last BM: 06/27/18 continent Bladder mgmt: continent Diabetic mgmt: no     Previous Home Environment Living Arrangements: Spouse/significant other Available Help at Discharge: Family, Available 24 hours/day Type of Home: House Home Layout: One level Home Access: Stairs to enter Entrance Stairs-Rails: Right Entrance Stairs-Number of Steps: 6 Bathroom Shower/Tub: Multimedia programmer: Standard Home  Care Services: No Additional Comments: two cats- within the home but 10 total in the house but are in a special room in the house  Discharge Living Setting Plans for Discharge Living Setting: Patient's home, Lives with (comment)(wife) Type of Home at Discharge:  House Discharge Home Layout: Two level, Able to live on main level with bedroom/bathroom Alternate Level Stairs-Number of Steps: NA Discharge Home Access: Stairs to enter Entrance Stairs-Rails: Right Entrance Stairs-Number of Steps: 6 Discharge Bathroom Shower/Tub: Walk-in shower(with stool and suction cup grab bars) Discharge Bathroom Toilet: Standard Discharge Bathroom Accessibility: Yes How Accessible: Accessible via walker Does the patient have any problems obtaining your medications?: No  Social/Family/Support Systems 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) Contact Information: wife [phone]) Anticipated Caregiver: wife and neighbors can check in  Anticipated Caregiver's Contact Information: see above Ability/Limitations of Caregiver: Min A Caregiver Availability: 24/7 Discharge Plan Discussed with Primary Caregiver: Yes Is Caregiver In Agreement with Plan?: Yes Does Caregiver/Family have Issues with Lodging/Transportation while Pt is in Rehab?: No   Goals/Additional Needs Patient/Family Goal for Rehab: PT/OT: Supervision/Min A; SLP: NA Expected length of stay: 15-20 days Cultural Considerations: NA Dietary Needs: regular diet, thin liquids Equipment Needs: TBD Pt/Family Agrees to Admission and willing to participate: Yes Program Orientation Provided & Reviewed with Pt/Caregiver Including Roles  & Responsibilities: Yes(with pt and his wife)  Barriers to Discharge: Inaccessible home environment, Medical stability, Home environment access/layout, Pending chemo/radiation, New oxygen  Barriers to Discharge Comments: tub shower, pending radiation?, pain control Per Dr. Rolena Infante, the plan is for pt to be followed by oncology and once the wounds have healed, the patient will have radiation therapy to the left clavicle and right seventh rib.   Decrease burden of Care through IP rehab admission: NA    Possible need for SNF placement upon discharge: Not anticipated; pt has good  social support at DC with wife able to provide anticipated level of assist at DC from CIR.    Patient Condition: This patient's medical and functional status has changed since the consult dated: 06/17/18 in which the Rehabilitation Physician determined and documented that the patient's condition is appropriate for intensive rehabilitative care in an inpatient rehabilitation facility. See "History of Present Illness" (above) for medical update. Functional changes are: progression in ADL status from Max x2 to Mod A x2 for toilet transfers, progression in transfer status from Mod Ax2 to Min/Mod Ax2 and progression from no gait to 75 feet with Min A x2. Patient's medical and functional status update has been discussed with the Rehabilitation physician and patient remains appropriate for inpatient rehabilitation. Will admit to inpatient rehab today.  Preadmission Screen Completed By:  Jhonnie Garner, 06/29/2018 12:25 PM ______________________________________________________________________   Discussed status with Dr. Naaman Plummer on 06/29/18 at 12:23PM and received telephone approval for admission today.  Admission Coordinator:  Jhonnie Garner, time 12:23PM/Date 06/29/18.           Cosigned by: Meredith Staggers, MD at 06/29/2018 1:21 PM  Revision History

## 2018-06-29 NOTE — Progress Notes (Signed)
    Subjective: Procedure(s) (LRB): DECOMPRESSIVE LUMBAR 4-5 LAMINECTOMY LEVEL 1 (N/A) 5 Days Post-Op  Patient reports pain as 3 on 0-10 scale.  Reports decreased leg pain reports incisional back pain   Positive void Positive bowel movement Positive flatus Negative chest pain or shortness of breath  Objective: Vital signs in last 24 hours: Temp:  [98.2 F (36.8 C)-98.6 F (37 C)] 98.6 F (37 C) (01/07 3762) Pulse Rate:  [96-106] 106 (01/06 2107) BP: (123-130)/(72-81) 128/72 (01/07 0124) SpO2:  [91 %-98 %] 98 % (01/06 1645)  Intake/Output from previous day: 01/06 0701 - 01/07 0700 In: -  Out: 1950 [Urine:1950]  Labs: No results for input(s): WBC, RBC, HCT, PLT in the last 72 hours. Recent Labs    06/26/18 1034 06/27/18 0610  NA 137 135  K 3.3* 3.7  CL 108 104  CO2 23 24  BUN 6* 5*  CREATININE 0.68 0.63  GLUCOSE 129* 105*  CALCIUM 6.9* 7.1*   No results for input(s): LABPT, INR in the last 72 hours.  Physical Exam: ABD soft Intact pulses distally Dorsiflexion/Plantar flexion intact Incision: dressing C/D/I and no drainage Compartment soft Proximal LE motor exam improving: 4/5 generalized weakness bilaterally.  Ambulated 63 ft yesterday.  Sensation to light touch intact in LE bilaterally.  5/5 EHL/TA/GA bilaterally Body mass index is 29.67 kg/m.   Assessment/Plan: Patient stable  xrays n/a Continue mobilization with physical therapy Continue care  Advance diet Up with therapy Plan on d/c to CIR today  Melina Schools, MD [facility] [phone]

## 2018-06-29 NOTE — Progress Notes (Signed)
Patient transferred to 4 MW room 10 Report given to [place], [place]

## 2018-06-29 NOTE — Progress Notes (Signed)
Received pt. As a new admit,pt. And his wife were oriented to the unit protocol.Safety plan was explained,fall prevention plan was explained and sing.RN received report from Vascular lab tech about pt. Results ,Reesa Chew [facility] was informed about pt. Being positive for DVT on right leg.Keep monitoring pt. Closely.

## 2018-06-29 NOTE — Progress Notes (Signed)
Bilateral lower extremity venous duplex has been completed. There is evidence of acute deep vein thrombosis involving the common femoral vein of the left lower extremity. Results were given to the patient's nurse, Maudry Mayhew.  06/29/18 4:53 PM Brian [name] RVT

## 2018-06-29 NOTE — Progress Notes (Signed)
Inpatient Rehabilitation-Admissions Coordinator   Pt will be admitted to CIR today. AC has notified pt, his family, RN, and SW regarding plan.   Please call if questions.   Jhonnie Garner, OTR/L  Rehab Admissions Coordinator  [phone] 06/29/2018 12:47 PM

## 2018-06-29 NOTE — Progress Notes (Signed)
Discussed results of dopplers with patient and wife. Wife reports that Dr. Rolena Infante did not want to use blood thinners due to persistent hematomas and anemia--high bleeding risk.  Place patient on bed rest and order IVC filter for now--discussed that patient will likely require blood thinner in the future due to multiple risk factors for clotting.

## 2018-06-29 NOTE — H&P (Signed)
Physical Medicine and Rehabilitation Admission H&P     CC: Pathologic fracture L4 with functional deficits.      HPI: Brian [name] is a 73 year old male with history of RA, emphysema, tobacco abuse with low back and hip pain.  Work-up done revealing bronchogenic RLL lung cancer with bony mets to left clavicle, right eighth rib, L4 vertebral body with fracture and 2.5 cm liver lesion.  He was admitted from office for work-up on 1December 06, 202019 and surgical intervention of pathological fracture recommended by Dr. Marin Olp.  He underwent L4 corpectomy with L3-L5 fusion by Dr. Rolena Infante on 12/20.  Perioperatively had blood loss anemia with hematoma at corpectomy site requiring 4 units PRBC and 2 units FFP.  Surgery stopped due to bleeding and CT abdomen/pelvis revealed small retroperitoneal hematoma extending to psoas muscle.  Once stabilized, he was taken back to the OR on 12/12 for screw fixation L3-L5.  Pathology revealed metastatic squamous cell lung cancer with plans for HRT in future.   He continued to have issues with severe pain as well as bilateral lower extremity weakness.  Follow up MRI spine 12/27 revealed retroperitoneal hematoma, s/p corpectomy with L3-5 fusion and unchanged severe spinal canal stenosis L4 level. He has required multiple units packed red blood cells as well as IV iron for anemia.  Follow-up CT lumbar spine 1/1  revealed large right and small left retroperitoneal hematomas expanding psoas muscle--unchanged. Rash on back stable. Palliative care consulted for symptom management as well as GOC. Fentanyl and dilaudid added for pain control and due to ongoing issues with neuropathy was taken back to OR on 06/24/18  for L4/5 Gill decompression.  He has had improvement in pain control and mobility and CIR recommended due to functional deficits.      Review of Systems  Constitutional: Positive for malaise/fatigue. Negative for chills and fever.  HENT: Negative for tinnitus.   Eyes:  Negative for blurred vision and double vision.  Respiratory: Positive for cough. Negative for sputum production and shortness of breath.   Cardiovascular: Positive for chest pain (right lateral chest).  Gastrointestinal: Negative for abdominal pain, constipation and vomiting.  Genitourinary: Negative for dysuria and urgency.  Musculoskeletal: Positive for back pain, joint pain (left clavicle region) and myalgias.  Skin: Positive for rash. Negative for itching.  Neurological: Positive for sensory change (RLE > LLE numbness ) and weakness.  Psychiatric/Behavioral: The patient has insomnia.           Past Medical History:  Diagnosis Date  . Arthritis             Past Surgical History:  Procedure Laterality Date  . ANTERIOR CERVICAL CORPECTOMY Left 06/11/2018    Procedure: L4 Corpectomy, L3-5 anterior reconstruction,;  Surgeon: Melina Schools, MD;  Location: [facility];  Service: Orthopedics;  Laterality: Left;  6.5 hrs- Ok for this day/time per April  . CATARACT EXTRACTION, BILATERAL      . DECOMPRESSIVE LUMBAR LAMINECTOMY LEVEL 1 N/A 06/24/2018    Procedure: DECOMPRESSIVE LUMBAR 4-5 LAMINECTOMY LEVEL 1;  Surgeon: Melina Schools, MD;  Location: [facility];  Service: Orthopedics;  Laterality: N/A;           Family History  Problem Relation Age of Onset  . Diabetes Mother    . Heart attack Father    . Obesity Father    . Diabetes Sister        Social History: Married. Retired Engineer, maintenance (IT). Independent with use of walker for past few months due  to back pain. He reports that he has been smoking cigarettes. He has been smoking about 0.25 packs per day. He has never used smokeless tobacco. He reports that he does not use drugs. No history on file for alcohol.           Allergies  Allergen Reactions  . Minoxidil Palpitations  . Nicotine Palpitations  . Sulfamethoxazole Other (See Comments)      Both parents allergic, avoids med            Medications Prior to Admission  Medication Sig Dispense  Refill  . aspirin 81 MG chewable tablet Chew 81 mg by mouth daily.      . Calcium Carb-Cholecalciferol (CALCIUM 600/VITAMIN D3) 600-800 MG-UNIT TABS Take 1 tablet by mouth daily.      Marland Kitchen docusate sodium (COLACE) 100 MG capsule Take 100 mg by mouth 2 (two) times daily as needed for mild constipation.      . folic acid (FOLVITE) 1 MG tablet Take 1 mg by mouth daily.      . Homeopathic Products (T-RELIEF PAIN RELIEF) CREA Apply 1 application topically 2 (two) times daily. Hip/Back pain      . HYDROmorphone (DILAUDID) 4 MG tablet Take 4 mg by mouth every 6 (six) hours as needed for severe pain.      . hydroxypropyl methylcellulose / hypromellose (ISOPTO TEARS / GONIOVISC) 2.5 % ophthalmic solution Place 1-2 drops into both eyes as needed for dry eyes.      Marland Kitchen ibuprofen (ADVIL,MOTRIN) 200 MG tablet Take 400 mg by mouth every 6 (six) hours as needed for headache or mild pain.      Marland Kitchen Lysine 500 MG CAPS Take 500 mg by mouth daily as needed (chapped lips).      . Omega-3 350 MG CPDR Take 350 mg by mouth daily.      . Potassium 99 MG TABS Take 99 mg by mouth daily as needed (leg cramps).      . pseudoephedrine (SUDAFED) 120 MG 12 hr tablet Take 120 mg by mouth every 12 (twelve) hours as needed for congestion.          Drug Regimen Review  Drug regimen was reviewed and remains appropriate with no significant issues identified   Home: Home Living Family/patient expects to be discharged to:: Inpatient rehab Living Arrangements: Spouse/significant other Available Help at Discharge: Family, Available 24 hours/day Type of Home: House Home Access: Stairs to enter [employer] of Steps: 6 Entrance Stairs-Rails: Right Home Layout: One level Bathroom Shower/Tub: Multimedia programmer: Standard Home Equipment: Environmental consultant - 2 wheels, Grab bars - tub/shower, Shower seat, Hand held shower head, Financial controller: Reacher Additional Comments: two cats- within the home but 10  total in the house but are in a special room in the house   Functional History: Prior Function Level of Independence: Needs assistance Gait / Transfers Assistance Needed: walker at baseline ADL's / Homemaking Assistance Needed: sitting with wife helping with back and buttock   Functional Status:  Mobility: Bed Mobility Overal bed mobility: Needs Assistance Bed Mobility: Rolling, Sidelying to Sit Rolling: Min guard Sidelying to sit: Min assist Supine to sit: Max assist Sit to supine: Max assist, +2 for physical assistance Sit to sidelying: Mod assist General bed mobility comments: minA for trunk elevation, verbal cues for log roll and not twisting Transfers Overall transfer level: Needs assistance Equipment used: Rolling walker (2 wheeled) Transfer via Lift Equipment: Stedy Transfers: Sit to/from [employer] to  Stand: Min assist, Mod assist, +2 physical assistance Stand pivot transfers: (Used Stedy for bed to chair) General transfer comment: verbal cues to push up from bed, min/modA to power up and steady during transition of hands from bed to RW Ambulation/Gait Ambulation/Gait assistance: Min assist, +2 safety/equipment Gait Distance (Feet): 100 Feet(x2) Assistive device: Rolling walker (2 wheeled) Gait Pattern/deviations: Step-through pattern, Decreased step length - right, Decreased step length - left, Trunk flexed General Gait Details: c/o fatigue however improved from yesterday Gait velocity: dec Gait velocity interpretation: <1.31 ft/sec, indicative of household ambulator   ADL: ADL Overall ADL's : Needs assistance/impaired Eating/Feeding: Set up Grooming: Minimal assistance, Standing, Wash/dry hands, Wash/dry face Grooming Details (indicate cue type and reason): cueing to maintain posture and precautions, requires 1 hand support Upper Body Bathing: Moderate assistance Lower Body Bathing: Total assistance Upper Body Dressing : Maximal assistance Lower Body Dressing:  Maximal assistance, Sitting/lateral leans, +2 for physical assistance Lower Body Dressing Details (indicate cue type and reason): initated educated on AE, demosntrating ability to don/doff B socks using reacher/sock aide with supervision, min cueing for precautions Toilet Transfer: Moderate assistance, +2 for physical assistance, RW, Ambulation Toilet Transfer Details (indicate cue type and reason): simulated, cueing for hand placement and safety  Toileting- Clothing Manipulation and Hygiene: Total assistance, +2 for physical assistance Toileting - Clothing Manipulation Details (indicate cue type and reason): +2 assist to squat in stedy in order to attempt cleaning after incontinent BM Functional mobility during ADLs: Moderate assistance, +2 for physical assistance General ADL Comments: attempted stedy for transfer, pt fatigue and unable to raise hips off chair more than 20 degrees given max assist +2    Cognition: Cognition Overall Cognitive Status: Within Functional Limits for tasks assessed Orientation Level: Oriented X4 Cognition Arousal/Alertness: Awake/alert Behavior During Therapy: WFL for tasks assessed/performed Overall Cognitive Status: Within Functional Limits for tasks assessed General Comments: patient with depressed spirits, frustrated throughout session     Blood pressure 125/82, pulse 100, temperature 98.7 F (37.1 C), temperature source Axillary, resp. rate 16, height 5\' 10"  (1.778 m), weight 93.8 kg, SpO2 95 %. Physical Exam  Nursing note and vitals reviewed. Constitutional: He is oriented to person, place, and time. He appears well-developed and well-nourished.  HENT:  Head: Normocephalic.  Eyes: Pupils are equal, round, and reactive to light.  Neck: Normal range of motion.  Cardiovascular: Normal rate and regular rhythm. Exam reveals no friction rub.  No murmur heard. Respiratory: Effort normal and breath sounds normal. No respiratory distress. He has no wheezes. He  has no rales.  GI: Soft. He exhibits no distension. There is no abdominal tenderness. There is no rebound.  Musculoskeletal:        General: Edema present.     Comments: 2+ pitting edema on mid back, flank and upper thighs.   Neurological: He is alert and oriented to person, place, and time.  Dysphonia noted. Able to follow basic commands without difficulty. UE 4/5, LE: 3+ to 4/5HF, 4/5 KE and 4/5 ADF/PF. No focal sensory findings.   Skin: Skin is warm and dry. Rash noted.  Patches of rash on back due to contact dermatitis. Ecchymosis right flank? Lower back and left flank area with foam dressings in place with underlying wounds c/generally dry/intact  Psychiatric: His behavior is normal. Judgment and thought content normal. His affect is blunt.      Lab Results Last 48 Hours  No results found for this or any previous visit (from the past 48 hour(s)).  Imaging Results (Last 48 hours)  No results found.           Medical Problem List and Plan: 1.  Functional and mobility deficits secondary to metastatic lung cancer to left clavicle, ribs, L4 vertebral body with fracture. Pt is s/p L4 corpectomy and L3-5 fusion by Dr. Rolena Infante on 06/11/18             -admit to inpatient rehab (activity on hold due to below) 2.  DVT Prophylaxis/Anticoagulation: dopplers done this evening and patient has DVT in left CFV  -bed rest  -consult IR for IVCF placement  -pt not a candidate for anticoagulation due to hematoma and bleeding risk at present although long term he is high VTE risk 3. Pain Management: Continue Fentanyl 12.5 mcg with norco/dilaudid prn. Unable to tolerate Oxycodone. Monitor patient's pain with increased activity.  4. Mood: LCSW to follow for evaluation and support.  5. Neuropsych: This patient is capable of making decisions on his own behalf. 6. Skin/Wound Care: Monitor wound for healing. Has been refusing ensure--will add prostat to help promote wound healing. 7.  Fluids/Electrolytes/Nutrition: Monitor I/O. Check lytes  In am.  8. Blood pressure; Intermittent elevation noted --monitor for now.   9. ABLA: Continue to monitor with serial checks.  10.  Intermittent Hypokalemia: Recheck labs in am. Check Magnesium levels as boderline normal.  11. Rash: Continue Clobetasol bid as well as hypoallergenic sheets.  12. Neuropathic pain: Improving--on gabapentin 300 mg tid 13. Constipation: Managed with miralax prn--patient wants to manage his bowel program.      Post Admission Physician Evaluation: 1. Functional deficits secondary  to metastatic lung cancer to spine. 2. Patient is admitted to receive collaborative, interdisciplinary care between the physiatrist, rehab nursing staff, and therapy team. 3. Patient's level of medical complexity and substantial therapy needs in context of that medical necessity cannot be provided at a lesser intensity of care such as a SNF. 4. Patient has experienced substantial functional loss from his/her baseline which was documented above under the "Functional History" and "Functional Status" headings.  Judging by the patient's diagnosis, physical exam, and functional history, the patient has potential for functional progress which will result in measurable gains while on inpatient rehab.  These gains will be of substantial and practical use upon discharge  in facilitating mobility and self-care at the household level. 5. Physiatrist will provide 24 hour management of medical needs as well as oversight of the therapy plan/treatment and provide guidance as appropriate regarding the interaction of the two. 6. The Preadmission Screening has been reviewed and patient status is unchanged unless otherwise stated above. 7. 24 hour rehab nursing will assist with bladder management, bowel management, safety, skin/wound care, disease management, medication administration, pain management and patient education  and help integrate therapy concepts,  techniques,education, etc. 8. PT will assess and treat for/with: Lower extremity strength, range of motion, stamina, balance, functional mobility, safety, adaptive techniques and equipment, pain mgt, ortho precautions, NMR.   Goals are: supervision to min assist. 9. OT will assess and treat for/with: ADL's, functional mobility, safety, upper extremity strength, adaptive techniques and equipment, pain mgt, don/doffing of TLSO, family education.   Goals are: supervision to min assist. Therapy may proceed with showering this patient. 10. SLP will assess and treat for/with: n/a.  Goals are: n/a. 11. Case Management and Social Worker will assess and treat for psychological issues and discharge planning. 12. Team conference will be held weekly to assess progress toward goals and to determine  barriers to discharge. 13. Patient will receive at least 3 hours of therapy per day at least 5 days per week. 14. ELOS: 15-20        15. Prognosis:  good     I have personally performed a face to face diagnostic evaluation of this patient and formulated the key components of the plan.  Additionally, I have personally reviewed laboratory data, imaging studies, as well as relevant notes and concur with the physician assistant's documentation above.   Meredith Staggers, MD, Mellody Drown     Bary Leriche, PA-C 06/29/2018

## 2018-06-29 NOTE — Progress Notes (Signed)
Physical Therapy Treatment Patient Details Name: Brian [name] MRN: [id number] DOB: [birth year] Today's Date: 06/29/2018    History of Present Illness 73 yo s/p L3-5 PLIF 12/22 after L4 corpectomy 12/20 due to metastic with lesion with primary stage III lung CA commanded by acute respiratory failure intubated from 12/20-12/23.  Underwent posterior decompression L4-5 on 06/24/18.  PMH: emphysema, pathologic L4 fx, lung CA with mets to the bone, RA, abdominal aortic aneurysm    PT Comments    Pt cont to progress towards all goals. Pt with improved transfer ability and ambulation tolerance. Pt c/o fatigue however pt with prolonged hospital stay and 2 back surgeries. Pt motivated and eager to transfer to rehab to further progress function. Acute PT to cont to follow.   Follow Up Recommendations  CIR;Supervision/Assistance - 24 hour     Equipment Recommendations  None recommended by PT    Recommendations for Other Services Rehab consult     Precautions / Restrictions Precautions Precautions: Back Precaution Booklet Issued: Yes (comment) Precaution Comments: re-educated on BLT, pt cont to be reminded during function Required Braces or Orthoses: Spinal Brace Spinal Brace: Thoracolumbosacral orthotic;Applied in sitting position Restrictions Weight Bearing Restrictions: No    Mobility  Bed Mobility Overal bed mobility: Needs Assistance Bed Mobility: Rolling;Sidelying to Sit Rolling: Min guard Sidelying to sit: Min assist       General bed mobility comments: minA for trunk elevation, verbal cues for log roll and not twisting  Transfers Overall transfer level: Needs assistance Equipment used: Rolling walker (2 wheeled) Transfers: Sit to/from Stand Sit to Stand: Min assist;Mod assist;+2 physical assistance         General transfer comment: verbal cues to push up from bed, min/modA to power up and steady during transition of hands from bed to  RW  Ambulation/Gait Ambulation/Gait assistance: Min assist;+2 safety/equipment Gait Distance (Feet): 100 Feet(x2) Assistive device: Rolling walker (2 wheeled) Gait Pattern/deviations: Step-through pattern;Decreased step length - right;Decreased step length - left;Trunk flexed Gait velocity: dec Gait velocity interpretation: <1.31 ft/sec, indicative of household ambulator General Gait Details: c/o fatigue however improved from yesterday   Stairs             Wheelchair Mobility    Modified Rankin (Stroke Patients Only)       Balance Overall balance assessment: Needs assistance Sitting-balance support: Feet supported Sitting balance-Leahy Scale: Fair Sitting balance - Comments: able to tolerate sitting EOB without UE support x 15 sec. sitting balance improving   Standing balance support: Single extremity supported;During functional activity Standing balance-Leahy Scale: Fair Standing balance comment: dependent on RW                            Cognition Arousal/Alertness: Awake/alert Behavior During Therapy: WFL for tasks assessed/performed Overall Cognitive Status: Within Functional Limits for tasks assessed                                        Exercises      General Comments General comments (skin integrity, edema, etc.): VSS      Pertinent Vitals/Pain Pain Assessment: 0-10 Pain Score: 3  Pain Location: low back Pain Descriptors / Indicators: Aching Pain Intervention(s): Monitored during session    Home Living  Prior Function            PT Goals (current goals can now be found in the care plan section) Progress towards PT goals: Progressing toward goals    Frequency    Min 5X/week      PT Plan Current plan remains appropriate    Co-evaluation              AM-PAC PT "6 Clicks" Mobility   Outcome Measure  Help needed turning from your back to your side while in a flat bed  without using bedrails?: A Little Help needed moving from lying on your back to sitting on the side of a flat bed without using bedrails?: A Little Help needed moving to and from a bed to a chair (including a wheelchair)?: A Lot Help needed standing up from a chair using your arms (e.g., wheelchair or bedside chair)?: A Lot Help needed to walk in hospital room?: A Little Help needed climbing 3-5 steps with a railing? : A Lot 6 Click Score: 15    End of Session Equipment Utilized During Treatment: Back brace Activity Tolerance: Patient limited by fatigue Patient left: in chair;with call bell/phone within reach;with family/visitor present;with chair alarm set Nurse Communication: Mobility status PT Visit Diagnosis: Muscle weakness (generalized) (M62.81);Difficulty in walking, not elsewhere classified (R26.2)     Time: 5427-0623 PT Time Calculation (min) (ACUTE ONLY): 26 min  Charges:  $Gait Training: 23-37 mins                     Kittie Plater, PT, DPT Acute Rehabilitation Services Pager #: [phone] Office #: [phone]    Berline Lopes 06/29/2018, 11:34 AM

## 2018-06-29 NOTE — Discharge Summary (Signed)
Patient ID: Brian [name] MRN: [id number] DOB/AGE: [birth year] 73 y.o.  Admit date: 06/04/2018 Discharge date: 06/29/2018  Admission Diagnoses:  Principal Problem:   Lung cancer metastatic to bone [facility]) Active Problems:   Lumbar spine tumor   Bronchitis due to tobacco use   Lung cancer, primary, with metastasis from lung to other site, right (HCC)   Squamous cell lung cancer, right ([place])   Palliative care by specialist   Cancer related pain   S/P lumbar fusion   Rash   Hypokalemia   Normocytic anemia   Metastatic lung cancer (metastasis from lung to other site) [facility])   Discharge Diagnoses:  Principal Problem:   Lung cancer metastatic to bone [facility]) Active Problems:   Lumbar spine tumor   Bronchitis due to tobacco use   Lung cancer, primary, with metastasis from lung to other site, right (HCC)   Squamous cell lung cancer, right ([place])   Palliative care by specialist   Cancer related pain   S/P lumbar fusion   Rash   Hypokalemia   Normocytic anemia   Metastatic lung cancer (metastasis from lung to other site) ([place])  status post Procedure(s): DECOMPRESSIVE LUMBAR 4-5 LAMINECTOMY LEVEL 1  Past Medical History:  Diagnosis Date  . Arthritis     Surgeries: Procedure(s): DECOMPRESSIVE LUMBAR 4-5 LAMINECTOMY LEVEL 1 on 06/24/2018   Consultants: Treatment Team:  Volanda Napoleon, MD Melina Schools, MD  Discharged Condition: Improved  Hospital Course: Brian [name] is an 73 y.o. male who was admitted 06/04/2018 for operative treatment of Lung cancer metastatic to bone [facility]). Patient failed conservative treatments (please see the history and physical for the specifics) and had severe unremitting pain that affects sleep, daily activities and work/hobbies.   Hospital course: Patient was admitted 06/04/18 after an evaluation by my partner in the office.  Patient initially presented with significant back pain with a diagnosis of a lumbar sprain.  During the course of  his subsequent workup imaging studies demonstrated a mass at L4 with a pathological fracture.  Imaging studies were consistent with metastatic disease.  As a result the patient was ultimately admitted from the office to [facility].  Over the course of the next few days the patient had a comprehensive oncology workup.  This included advanced imaging studies of the brain, chest, abdomen, and pelvis.  In addition he had a bone scan.  Patient was ultimately diagnosed with metastatic lung cancer.  Imaging studies demonstrated metastasis to the left clavicle, and right seventh rib.  Also identified a large right lower lobe lung mass consistent with bronchogenic carcinoma.  Prior to admission the patient was having debilitating back pain that essentially prevented him from ambulating.  As a result of the failure to thrive and has significant pain he was admitted.  After the initial workup I was consult by my partner Dr. Emiliano Dyer to evaluate and take over care.  At this point Dr. Marin Olp from oncology was also involved in the patient's care.  Upon review of the imaging studies it was felt as though the patient had terminal lung cancer and was given 1 year.  After determining his estimated life span the decision was made to move forward with surgery.  After reviewing all of his imaging studies I felt as though the best course of action was an L4 corpectomy with L3-L5 reconstruction.  This would allow for stabilization of the pathological fracture, as well as debulking the tumor.  I would then supplement this  with posterior fixation and a possible posterior decompression if needed.  I reviewed the surgical option with the patient and his wife and all of the risks and benefits.  After they expressed understanding we elected to proceed with surgery.  The patient was ultimately transferred from [facility] long hospital to: Hospital for surgical intervention.  After obtaining preoperative medical clearance the patient  was ultimately taken to the operating room on 06/11/18.  The patient underwent an L4 corpectomy with L3-5 reconstruction with a titanium intervertebral spacer.  During the course of this surgery the patient was noted to have a 1 L blood loss requiring 4 unit red blood cell transfusion and 2 units of FFP.  There was ultimately an injury noted to the right segmental vessel.  Attempts at coagulating this were not successful.  After inserting the intervertebral device and expanding it the bleeding did significantly decrease.  As a result of the 1 L blood loss I elected to terminate the procedure so that he can be brought to the ICU for resuscitation.  Immediate postoperative CT scan imaging demonstrated no expanding hematoma.  I discussed the case with the vascular interventional radiology specialist Dr. Jacqualyn Posey and we both felt as though there was no indication for embolization.  The patient was in the ICU hemodynamically stable not requiring pressor agents.  The CT scan demonstrated a hematoma in the retroperitoneum and was felt as though this with tapenade.  After discussing this with the patient's wife as well as the interventional radiologist we continue to monitor the patient in the ICU.  The patient remained hemodynamically stable and so on 06/13/18 we returned to the operating room for the posterior pedicle screw fixation L3-L5.  This was done through a minimally invasive approach with percutaneous screw placement.  The surgery was uneventful.  The patient was then returned to the ICU where he remained intubated.  On 06/14/18 the patient was extubated.  He tolerated the procedure well had clear breath sounds bilaterally.  Pain control was a primary issue and so he was started on a PCA.  Because of the prolonged bedridden time.  I felt as though mobilization with physical therapy was critical.  Patient was noted to have postoperative anemia due to the acute blood loss and so iron supplementation both oral and  IV was begun.  The therapy was ordered as was occupational therapy.  However it should be noted that it was quite difficult due to his weakness and pain.  An inpatient rehab consult was requested and he was felt to be a good candidate once he was cleared medically.  Patient was continually followed by myself and Dr. Marin Olp as well as the hospitalist team.  Over the course of the next few days the patient continued to slowly improve.  Ultimately though he started having significant nausea and emesis.  As a result the oral iron was stopped and his pain medications were adjusted.  As his nausea and emesis improved I ultimately DC'd his telemetry and we continue attempt mobilization with physical therapy.  His hematocrit remained stable.  On 06/18/18 the patient had acute onset of severe increasing right flank pain as well as bilateral lower extremity pain right side worse than the left.  Repeat MRI demonstrated the ongoing right psoas hematoma as well as persistent severe spinal stenosis and right foraminal stenosis at L4-5 due to tumor mass.  On 06/19/18 as a result of the MRI findings we stopped his Lovenox and continued with mechanical DVT prevention.  Positive pain care management was requested for improved pain control.  The patient's hematocrit did decrease and as result on 06/21/18 he was transfused 2 units of packed red blood cells.  I then consult interventional radiology to determine if the psoas hematoma could be drained.  Unfortunately interventional radiology did not feel as though there was a fluid component accessible for drain placement.  As a result of the inability to drain the hematoma, is ongoing neuropathic pain I elected to obtain a CT scan to evaluate the hardware to ensure that there was no malpositioning.  CT scan demonstrated satisfactory position of the pedicle screw rod construct as well as the intervertebral cage.  We also demonstrated the ongoing psoas hematoma on the right side.   As a result of the increased neuropathic pain we elected to move forward with a formal posterior decompression.  I again explained the risks and benefits and rationale for surgery to the patient and his wife and consent was obtained.  The patient's hematocrit had responded appropriately to the 2 unit transfusion and he was hemodynamically stable.  As a result on 06/24/18 the patient was returned to the operating room for a L4-5 posterior decompression and foraminotomy.  Surgery was uneventful.  The patient noted almost immediate improvement of the neuropathic pain following the decompression.  Pain control was adequate with the fentanyl patch and hydrocodone that was recommended by palliative pain care management service.  He remained hemodynamically stable.  We continue to work with physical therapy to maximize his improvement.  All wounds remained clean dry and intact and there is no signs of infection or dehiscence.  Patient did note preoperatively have a significant rash which was evaluated by the hospital service.  This was felt to be a contact dermatitis and was treated with topical agents.  It did improve.  On 06/28/17 the patient evaluated and felt to be a good candidate for inpatient rehab.  He was also noted to have walked 75 feet with physical therapy that day.  Should be noted that this was essentially the first time in almost 5 weeks that he was out of bed and ambulating.  The patient will be discharged today 06/29/20 code inpatient rehab.  Hospital summary:  L3-5 fusion with L4 corpectomy: 06/11/18.  18 days postop. Posterior L3-5 instrumented fusion: 06/13/18.  16 days postop. Posterior L4-5 decompression: 06/24/18.  5 days postop.  Patient will continue to be followed by oncology and once the wounds have healed we will move forward with radiation therapy to the left clavicle, and right seventh rib.  Continue dry dressing with Mepilex over posterior and lateral incisions.  Recommend  mechanical DVT prevention with teds and SCDs.  Hold on IV anticoagulation given the right psoas hematoma.  May consider IVC filter if felt to be needed.  Continue to encourage appropriate by mouth nutrition.  Recommend TLSO brace when out of bed.  When the patient is seated in a chair it is okay to remove the brace.  Okay to shower.  Patient was given perioperative antibiotics:  Anti-infectives (From admission, onward)   Start     Dose/Rate Route Frequency Ordered Stop   06/24/18 2200  vancomycin (VANCOCIN) 1,250 mg in sodium chloride 0.9 % 250 mL IVPB     1,250 mg 166.7 mL/hr over 90 Minutes Intravenous  Once 06/24/18 1444 06/24/18 2341   06/24/18 2100  vancomycin (VANCOCIN) IVPB 1000 mg/200 mL premix  Status:  Discontinued     1,000 mg 200 mL/hr over  60 Minutes Intravenous Every 12 hours 06/24/18 1417 06/24/18 1444   06/24/18 0900  vancomycin (VANCOCIN) IVPB 1000 mg/200 mL premix     1,000 mg 200 mL/hr over 60 Minutes Intravenous To Surgery 06/24/18 0741 06/24/18 1437   06/13/18 2300  vancomycin (VANCOCIN) IVPB 1000 mg/200 mL premix  Status:  Discontinued     1,000 mg 200 mL/hr over 60 Minutes Intravenous Every 12 hours 06/13/18 1516 06/14/18 1734   06/12/18 0000  vancomycin (VANCOCIN) IVPB 1000 mg/200 mL premix  Status:  Discontinued     1,000 mg 200 mL/hr over 60 Minutes Intravenous Every 12 hours 06/11/18 2023 06/13/18 1401   06/11/18 1230  vancomycin (VANCOCIN) 1,500 mg in sodium chloride 0.9 % 500 mL IVPB     1,500 mg 250 mL/hr over 120 Minutes Intravenous  Once 06/11/18 1224 06/11/18 1434        Recent vital signs:  Patient Vitals for the past 24 hrs:  BP Temp Temp src Pulse SpO2  06/29/18 0633 - 98.6 F (37 C) Oral - -  06/29/18 0124 128/72 98.2 F (36.8 C) Oral - -  06/28/18 2107 - 98.3 F (36.8 C) Axillary (!) 106 -  06/28/18 1645 130/81 98.3 F (36.8 C) Oral 96 98 %     Recent laboratory studies:  Recent Labs    06/26/18 1034 06/27/18 0610  NA 137 135    K 3.3* 3.7  CL 108 104  CO2 23 24  BUN 6* 5*  CREATININE 0.68 0.63  GLUCOSE 129* 105*  CALCIUM 6.9* 7.1*    Diagnostic Studies: Dg Lumbar Spine 2-3 Views  Result Date: 06/13/2018 CLINICAL DATA:  Corpectomy and fixation of the lower lumbar spine. EXAM: DG C-ARM 61-120 MIN; LUMBAR SPINE - 2-3 VIEW COMPARISON:  CT 06/11/2018. FINDINGS: Posterior rod fixation with pedicle screws in the L3 and L5 vertebral bodies. L4 corpectomy with a spacer device. IMPRESSION: Posterior lumbar fusion and L4 corpectomy. Electronically Signed   By: Suzy Bouchard M.D.   On: 06/13/2018 13:59   Dg Lumbar Spine 2-3 Views  Result Date: 06/11/2018 CLINICAL DATA:  L4 corpectomy EXAM: LUMBAR SPINE - 2-3 VIEW; DG C-ARM 61-120 MIN COMPARISON:  MRI 06/04/2018 FINDINGS: Five low resolution intraoperative spot views of the lumbar spine. Total fluoroscopy time was 3 minutes 19 seconds. Images were obtained during L4 corpectomy and placement of interbody cage. IMPRESSION: Intraoperative fluoroscopic assistance provided during lumbar spine surgery Electronically Signed   By: Donavan Foil M.D.   On: 06/11/2018 19:07   Ct Chest W Contrast  Result Date: 06/04/2018 CLINICAL DATA:  CLINICAL DATA Patient with a pathologic fracture in L4 on postmyelogram CT scan 06/02/2018. Evaluate for primary and metastatic disease. EXAM: CT CHEST, ABDOMEN, AND PELVIS WITH CONTRAST TECHNIQUE: Multidetector CT imaging of the chest, abdomen and pelvis was performed following the standard protocol during bolus administration of intravenous contrast. CONTRAST:  100 mL OMNIPAQUE IOHEXOL 300 MG/ML  SOLN COMPARISON:  Postmyelogram lumbar spine CT scan 06/02/2018. FINDINGS: CT CHEST FINDINGS Cardiovascular: Heart size is normal. There is calcific aortic and coronary atherosclerosis. No pericardial effusion. Mediastinum/Nodes: Right hilar lymph node on image 37 measures 1.5 cm short axis dimension. Small node anterior to the right mainstem bronchus  measuring 1 cm on image 33 also noted. A few smaller mediastinal lymph nodes are noted. No axillary or supraclavicular lymphadenopathy. Lungs/Pleura: The lungs demonstrate extensive centrilobular emphysematous disease. There is a mass in the right lower lobe which measures 7.3 cm craniocaudal on image 72 of  series 7 by 8.1 cm transverse by 5.1 cm AP on image 108 of series 4. Subpleural nodule in the right middle lobe on image 120 measures 0.5 cm Musculoskeletal: A destructive lesion in the medial diaphysis of the left clavicle measures 3 cm long image 20 of series 4 and is approximately 1.4 cm medial to the clavicular head. A destructive lesion in the lateral arc of the right eighth rib measures 7 cm long by 3.1 cm transverse by 4.2 cm craniocaudal. Subtle lucent lesion lateral arc of the left seventh rib on image 105 of series 4 is also noted. No other focal bony abnormality is identified. A subcutaneous low attenuating lesion in the anterior left chest wall measures 1.8 cm AP x 2.6 cm transverse x 2.5 cm craniocaudal. CT ABDOMEN PELVIS FINDINGS Hepatobiliary: A 2.5 cm in diameter hypoattenuating lesion is seen in the left hepatic lobe near the dome on image 54. A punctate hypoattenuating lesion in the right hepatic lobe on image 70 is also identified. The liver is otherwise unremarkable. A few stones are seen in the gallbladder but there is no evidence of cholecystitis. Biliary tree appears normal. Pancreas: Unremarkable. No pancreatic ductal dilatation or surrounding inflammatory changes. Spleen: Normal in size without focal abnormality. Adrenals/Urinary Tract: Mild thickening of the left adrenal gland is most suggestive of hyperplasia. The right adrenal gland is unremarkable. Stomach/Bowel: Stomach is within normal limits. No evidence of appendicitis. No evidence of bowel wall thickening, distention, or inflammatory changes. Vascular/Lymphatic: The patient has extensive calcific atherosclerosis. Mild aneurysmal  dilatation of the descending abdominal aorta at 3.3 cm is identified. No lymphadenopathy. Reproductive: The prostate gland is mildly enlarged. Other: Small fat containing left inguinal hernia is noted. Musculoskeletal: Pathologic fracture of L4 is identified as seen on the comparison CT. No other bony abnormality is identified. Simple lipoma in the right gluteal musculature incidentally noted. IMPRESSION: Right lower lobe mass lesion most consistent with bronchogenic carcinoma. Destructive lesions in the left clavicle, right seventh ribs and L4 vertebral body are consistent with metastatic disease. Small mediastinal lymph nodes measuring up to 1.5 cm are identified may also be pathologic. Two lesions in the liver are identified and worrisome for metastatic disease. Advanced emphysema. Calcific aortic and coronary atherosclerosis. 3.3 cm abdominal aortic aneurysm is identified. Recommend followup by ultrasound in 3 years. This recommendation follows ACR consensus guidelines: White Paper of the ACR Incidental Findings Committee II on Vascular Findings. J Am Coll Radiol 2013; 10:789-794 Gallstones without evidence of cholecystitis. Cystic lesion in the subcutaneous tissues of the anterior left chest wall is likely a sebaceous cyst. Simple lipoma right gluteal musculature. Electronically Signed   By: Inge Rise M.D.   On: 06/04/2018 19:31   Ct Lumbar Spine Wo Contrast  Result Date: 06/23/2018 CLINICAL DATA:  Retroperitoneal hematoma follow-up EXAM: CT LUMBAR SPINE WITHOUT CONTRAST TECHNIQUE: Multidetector CT imaging of the lumbar spine was performed without intravenous contrast administration. Multiplanar CT image reconstructions were also generated. COMPARISON:  Lumbar spine MRI 06/18/2018 FINDINGS: Segmentation: 5 lumbar type vertebrae. Alignment: Normal. Vertebrae: Redemonstration of L3-5 posterior spinal fusion with corpectomy at L4. Normal appearance of the hardware. Paraspinal and other soft tissues:  There are large right and small left retroperitoneal hematomas expanding the psoas muscles. The right-sided hematoma measures 19.2 by 10.0 x 6.7 cm. The left-sided hematoma measures 11.8 x 4.7 by 4.9 cm. Disc levels: No upper spinal canal stenosis. Assessment of the lower lumbar spinal canal is limited by streak artifact from the hardware. IMPRESSION: 1.  Large right and small left retroperitoneal hematomas expanding the psoas muscles. Comparison to the MRI of 06/18/2018 is limited given differences in field of view, but sizes are approximately the same. 2. No acute fracture or static subluxation of the lumbar spine. The Electronically Signed   By: Ulyses Jarred M.D.   On: 06/23/2018 05:17   Ct Lumbar Spine W Contrast  Result Date: 06/02/2018 CLINICAL DATA:  Low back and bilateral leg pain. EXAM: LUMBAR MYELOGRAM FLUOROSCOPY TIME:  0 minutes 38 seconds. 142.58 micro gray meter squared PROCEDURE: After thorough discussion of risks and benefits of the procedure including bleeding, infection, injury to nerves, blood vessels, adjacent structures as well as headache and CSF leak, written and oral informed consent was obtained. Consent was obtained by Dr. [name]. Time out form was completed. Patient was positioned prone on the fluoroscopy table. Local anesthesia was provided with 1% lidocaine without epinephrine after prepped and draped in the usual sterile fashion. Puncture was performed at L4-5 using a 3 1/2 inch 22-gauge spinal needle via right paramedian approach. Using a single pass through the dura, the needle was placed within the thecal sac, with return of clear CSF. 15 mL of Isovue M-200 was injected into the thecal sac, with normal opacification of the nerve roots and cauda equina consistent with free flow within the subarachnoid space. I personally performed the lumbar puncture and administered the intrathecal contrast. I also personally performed acquisition of the myelogram images. TECHNIQUE:  Contiguous axial images were obtained through the Lumbar spine after the intrathecal infusion of infusion. Coronal and sagittal reconstructions were obtained of the axial image sets. COMPARISON:  Radiography 04/13/2018 FINDINGS: LUMBAR MYELOGRAM FINDINGS: There is no compressive central canal stenosis. There is an anterior extradural defect at the L4 level with an abnormal appearance of the posterior L4 vertebral body. There appears to be lytic change of the bone, new since the radiography of October. Extradural defect is present with some canal narrowing and lateral recess encroachment right more than left. L5-S1 shows disc space narrowing. Standing flexion extension views show perhaps lighted crease in prominence of the extradural defect at L4. No antero or retrolisthesis is seen. CT LUMBAR MYELOGRAM FINDINGS: No significant abnormality is seen at L2-3 or above. The vertebral bodies appear normal. The disc spaces are unremarkable. No compressive stenosis. At L4, there is a pathologic compression fracture with lytic destruction. There is loss of height posteriorly of 40%. Extraosseous tumor encroaches upon the spinal canal and both of the L4-5 intervertebral foramina, right more than left. Extraosseous tumor extends from the superior endplate of L4 down as far as the L4-5 disc. This is consistent with either metastatic carcinoma or myeloma. At L5-S1, there is chronic disc degeneration with vacuum phenomenon and bulging of the disc. There is bilateral facet degeneration. No central canal stenosis. There is bilateral foraminal narrowing right more than left. As shown at radiography, there is an infrarenal abdominal aortic aneurysm with transverse diameter maximal 3.8 cm. There are calcified gallstones within the gallbladder. No other regional finding. IMPRESSION: Pathologic fracture at L4 with maximal loss of height posteriorly of 40%. Lytic destruction of the L4 vertebral body. Extraosseous tumor encroaching upon  the spinal canal at the L4 level, worse on the right than the left, with some foraminal encroachment worse on the right than the left at L4-5. This could be metastatic carcinoma or myeloma. Chronic degenerative disc disease at L5-S1 but without significant stenosis. Infrarenal abdominal aortic aneurysm with maximal transverse diameter of 3.8  cm. Recommend followup by ultrasound in 2 years. This recommendation follows ACR consensus guidelines: White Paper of the ACR Incidental Findings Committee II on Vascular Findings. J Am Coll Radiol 2013; 10:789-794. Chololithiasis. These results will be called to the ordering clinician or representative by the Radiologist Assistant, and communication documented in the PACS or zVision Dashboard. Electronically Signed   By: [name] M.D.   On: 06/02/2018 11:59   Mr Jeri Cos RJ Contrast  Result Date: 06/07/2018 CLINICAL DATA:  Staging small cell lung cancer. EXAM: MRI HEAD WITHOUT AND WITH CONTRAST TECHNIQUE: Multiplanar, multiecho pulse sequences of the brain and surrounding structures were obtained without and with intravenous contrast. CONTRAST:  9 cc Gadavist. COMPARISON:  None. FINDINGS: Brain: Diffusion imaging does not show any acute or subacute infarction. There are a few old small vessel insults within the pons and cerebellum. Cerebral hemispheres show moderate chronic small-vessel ischemic changes of the deep and subcortical white matter without large vessel territory infarction. No evidence of hemorrhage, hydrocephalus or extra-axial collection. After contrast administration, there is no abnormal enhancement to suggest primary or metastatic mass lesion. Vascular: Major vessels at the base of the brain show flow. Skull and upper cervical spine: Negative Sinuses/Orbits: Clear/normal Other: None IMPRESSION: No evidence of metastatic disease. Moderate chronic small-vessel ischemic changes affecting the brain as outlined above. Electronically Signed   By: [name]  M.D.   On: 06/07/2018 14:45   Mr Lumbar Spine W Wo Contrast  Result Date: 06/18/2018 CLINICAL DATA:  Status post L4 corpectomy for resection of neoplasm. Increased low back pain. EXAM: MRI LUMBAR SPINE WITHOUT AND WITH CONTRAST TECHNIQUE: Multiplanar and multiecho pulse sequences of the lumbar spine were obtained without and with intravenous contrast. CONTRAST:  9 mL Gadavist COMPARISON:  Lumbar spine MRI 06/04/2018 FINDINGS: Segmentation:  Standard. Alignment:  Physiologic. Vertebrae: Status post L4 corpectomy and L3-L5 fusion. Bone marrow signal at the L4 level is largely obscured by susceptibility artifacts from the fusion/corpectomy hardware. Conus medullaris and cauda equina: Conus extends to the level. Conus and cauda equina appear normal. Paraspinal and other soft tissues: The right psoas muscle is markedly expanded at the L4 level, with abnormal internal T1-weighted signal. No associated contrast enhancement. There is edema of the left psoas muscle. Disc levels: Severe spinal canal stenosis at the L4 level is unchanged compared to the prior study. The posterior bulging of the vertebral body is unchanged. There is severe right foraminal stenosis. The other disc levels are unchanged. IMPRESSION: 1. Retroperitoneal hematoma expanding the right psoas muscle measuring up to 8.5 x 5.7 cm. 2. Status post L4 corpectomy and L3-5 fusion with unchanged severe spinal canal stenosis at the L4 level and severe right foraminal narrowing. 3. Magnetic susceptibility effects from the fusion and corpectomy hardware limit assessment of the bone marrow signal at the postoperative levels. These results will be called to the ordering clinician or representative by the Radiologist Assistant, and communication documented in the PACS or zVision Dashboard. Electronically Signed   By: Ulyses Jarred M.D.   On: 06/18/2018 22:21   Mr Lumbar Spine W Wo Contrast  Result Date: 06/04/2018 CLINICAL DATA:  Metastatic disease.  Lung  cancer. EXAM: MRI LUMBAR SPINE WITHOUT AND WITH CONTRAST TECHNIQUE: Multiplanar and multiecho pulse sequences of the lumbar spine were obtained without and with intravenous contrast. CONTRAST:  7.5 mL Gadavist COMPARISON:  CT lumbar spine 06/02/2018 FINDINGS: Segmentation: Normal. The lowest disc space is considered to be L5-S1. Alignment:  Normal Vertebrae: There is loss  of normal signal throughout the L4 vertebral body, where there is a known pathologic fracture with approximately 40% height loss. There is posterior convex bulging with multifocal contrast-enhancement. There is thickening of the overlying ventral dura with associated increased contrast enhancement. This results in severe spinal canal stenosis at this level. There are no other focal osseous lesions. Conus medullaris and cauda equina: The conus medullaris terminates at the L1 level. The cauda equina and conus medullaris are both normal. Paraspinal and other soft tissues: The visualized aorta, IVC and iliac vessels are normal. The visualized retroperitoneal organs and paraspinal soft tissues are normal. Disc levels: Sagittal plane imaging includes the T11-12 disc level through the upper sacrum, with axial imaging of the T12-L1 to L5-S1 disc levels. There is disc desiccation at the T12-L1, L1-L2 and L2-L3 levels without spinal canal stenosis or neural impingement. L3-4: Small disc bulge without associated stenosis. At the L4 level, there is severe spinal canal stenosis due to posterior bulging of the L4 vertebral body. L4-5: Minimal disc bulge. There is severe right neural foraminal stenosis due to the L4 mass. Mild left foraminal stenosis. L5-S1: Disc desiccation and mild endplate spurring. Moderate right neural foraminal stenosis. The visualized portion of the sacrum is normal. IMPRESSION: 1. Metastatic lesion of the L4 vertebral body with associated pathologic fracture and posterior bulging of the vertebral body. This, in combination with overlying  epidural thickening, causes severe spinal canal stenosis and severe right neural foraminal stenosis. 2. Moderate right L5-S1 neural foraminal stenosis. Electronically Signed   By: Ulyses Jarred M.D.   On: 06/04/2018 22:20   Nm Bone Scan Whole Body  Result Date: 06/07/2018 CLINICAL DATA:  Metastatic spine tumor. EXAM: NUCLEAR MEDICINE WHOLE BODY BONE SCAN TECHNIQUE: Whole body anterior and posterior images were obtained approximately 3 hours after intravenous injection of radiopharmaceutical. RADIOPHARMACEUTICALS:  20.2 mCi Technetium-85m MDP IV COMPARISON:  MRI of June 04, 2018. CT scan of June 04, 2018. FINDINGS: Abnormal uptake is seen in left knee consistent with degenerative change. Abnormal uptake is seen at L4 level consistent with metastatic lesion seen on prior MRI. Focus of abnormal uptake is seen involving the posterior portion of a right rib which is consistent with metastatic disease. Abnormal uptake is seen involving the anterior portions of the left first rib and left clavicle consistent with metastatic disease as described on prior CT scan. IMPRESSION: Abnormal uptake is seen in midportion of right rib, L4 vertebral body and anterior portions of left first rib and left clavicle consistent with metastatic disease. This correlates with findings seen on prior CT scan. Electronically Signed   By: Marijo Conception, M.D.   On: 06/07/2018 13:43   Ct Abdomen Pelvis W Contrast  Result Date: 06/04/2018 CLINICAL DATA:  CLINICAL DATA Patient with a pathologic fracture in L4 on postmyelogram CT scan 06/02/2018. Evaluate for primary and metastatic disease. EXAM: CT CHEST, ABDOMEN, AND PELVIS WITH CONTRAST TECHNIQUE: Multidetector CT imaging of the chest, abdomen and pelvis was performed following the standard protocol during bolus administration of intravenous contrast. CONTRAST:  100 mL OMNIPAQUE IOHEXOL 300 MG/ML  SOLN COMPARISON:  Postmyelogram lumbar spine CT scan 06/02/2018. FINDINGS: CT CHEST  FINDINGS Cardiovascular: Heart size is normal. There is calcific aortic and coronary atherosclerosis. No pericardial effusion. Mediastinum/Nodes: Right hilar lymph node on image 37 measures 1.5 cm short axis dimension. Small node anterior to the right mainstem bronchus measuring 1 cm on image 33 also noted. A few smaller mediastinal lymph nodes are noted. No axillary or  supraclavicular lymphadenopathy. Lungs/Pleura: The lungs demonstrate extensive centrilobular emphysematous disease. There is a mass in the right lower lobe which measures 7.3 cm craniocaudal on image 72 of series 7 by 8.1 cm transverse by 5.1 cm AP on image 108 of series 4. Subpleural nodule in the right middle lobe on image 120 measures 0.5 cm Musculoskeletal: A destructive lesion in the medial diaphysis of the left clavicle measures 3 cm long image 20 of series 4 and is approximately 1.4 cm medial to the clavicular head. A destructive lesion in the lateral arc of the right eighth rib measures 7 cm long by 3.1 cm transverse by 4.2 cm craniocaudal. Subtle lucent lesion lateral arc of the left seventh rib on image 105 of series 4 is also noted. No other focal bony abnormality is identified. A subcutaneous low attenuating lesion in the anterior left chest wall measures 1.8 cm AP x 2.6 cm transverse x 2.5 cm craniocaudal. CT ABDOMEN PELVIS FINDINGS Hepatobiliary: A 2.5 cm in diameter hypoattenuating lesion is seen in the left hepatic lobe near the dome on image 54. A punctate hypoattenuating lesion in the right hepatic lobe on image 70 is also identified. The liver is otherwise unremarkable. A few stones are seen in the gallbladder but there is no evidence of cholecystitis. Biliary tree appears normal. Pancreas: Unremarkable. No pancreatic ductal dilatation or surrounding inflammatory changes. Spleen: Normal in size without focal abnormality. Adrenals/Urinary Tract: Mild thickening of the left adrenal gland is most suggestive of hyperplasia. The right  adrenal gland is unremarkable. Stomach/Bowel: Stomach is within normal limits. No evidence of appendicitis. No evidence of bowel wall thickening, distention, or inflammatory changes. Vascular/Lymphatic: The patient has extensive calcific atherosclerosis. Mild aneurysmal dilatation of the descending abdominal aorta at 3.3 cm is identified. No lymphadenopathy. Reproductive: The prostate gland is mildly enlarged. Other: Small fat containing left inguinal hernia is noted. Musculoskeletal: Pathologic fracture of L4 is identified as seen on the comparison CT. No other bony abnormality is identified. Simple lipoma in the right gluteal musculature incidentally noted. IMPRESSION: Right lower lobe mass lesion most consistent with bronchogenic carcinoma. Destructive lesions in the left clavicle, right seventh ribs and L4 vertebral body are consistent with metastatic disease. Small mediastinal lymph nodes measuring up to 1.5 cm are identified may also be pathologic. Two lesions in the liver are identified and worrisome for metastatic disease. Advanced emphysema. Calcific aortic and coronary atherosclerosis. 3.3 cm abdominal aortic aneurysm is identified. Recommend followup by ultrasound in 3 years. This recommendation follows ACR consensus guidelines: White Paper of the ACR Incidental Findings Committee II on Vascular Findings. J Am Coll Radiol 2013; 10:789-794 Gallstones without evidence of cholecystitis. Cystic lesion in the subcutaneous tissues of the anterior left chest wall is likely a sebaceous cyst. Simple lipoma right gluteal musculature. Electronically Signed   By: Inge Rise M.D.   On: 06/04/2018 19:31   Dg Lumbar Spine 1 View  Result Date: 06/24/2018 CLINICAL DATA:  Decompressive laminectomy L4-5 EXAM: LUMBAR SPINE - 1 VIEW COMPARISON:  None. FINDINGS: Changes of corpectomy at L4 and posterior fusion from L3-L5. Posterior needles are directed at the L4 corpectomy level. IMPRESSION: Intraoperative  localization as above. Electronically Signed   By: Rolm Baptise M.D.   On: 06/24/2018 12:06   Dg Chest Port 1 View  Result Date: 06/13/2018 CLINICAL DATA:  ETT, Respiratory failure, non smoker. EXAM: PORTABLE CHEST 1 VIEW COMPARISON:  None. FINDINGS: Endotracheal tube and NG tube in good position. Linear density in the LEFT lower lobe  unchanged. Persistent low lung volumes. Upper lungs clear. IMPRESSION: 1. No interval change.  Stable support apparatus. 2. Atelectasis in the LEFT lower lobe. Electronically Signed   By: Suzy Bouchard M.D.   On: 06/13/2018 07:32   Dg Chest Port 1 View  Result Date: 06/12/2018 CLINICAL DATA:  Right lower lobe lung mass. EXAM: PORTABLE CHEST 1 VIEW COMPARISON:  06/11/2018 FINDINGS: 1340 hours. Endotracheal tube tip is 7.2 cm above the base of the carina. The NG tube passes into the stomach although the distal tip position is not included on the film. Interval increase in left base atelectasis. Right lower lobe pulmonary mass again noted. Interstitial markings are diffusely coarsened with chronic features. Probable tiny pleural effusions. No worrisome lytic or sclerotic osseous abnormality. Telemetry leads overlie the chest. IMPRESSION: New left lower lobe atelectasis with potential small bilateral effusions. Right lower lobe pulmonary mass. Chronic underlying interstitial lung disease. Electronically Signed   By: Misty Stanley M.D.   On: 06/12/2018 14:18   Dg Chest Port 1 View  Result Date: 06/11/2018 CLINICAL DATA:  Intubated. EXAM: PORTABLE CHEST 1 VIEW COMPARISON:  Chest CT 06/04/2018. FINDINGS: 2036 hours. Endotracheal tube tip is 8.2 cm above the base of the carina. Heart size upper normal. Interstitial markings are diffusely coarsened with chronic features. Known right lower lobe mass projects over the right hilum. There is right base atelectasis or infiltrate. IMPRESSION: 1. Endotracheal tube tip is 8.2 cm above the base the carina. 2. Known right lower lobe  mass projects over the right hilum. 3. Chronic interstitial lung disease. Atelectasis or infiltrate noted at the right base. Electronically Signed   By: Misty Stanley M.D.   On: 06/11/2018 20:50   Dg Spine Portable 1 View  Result Date: 06/24/2018 CLINICAL DATA:  Tumor of L4 vertebral body. EXAM: PORTABLE SPINE - 1 VIEW COMPARISON:  MRI dated 06/18/2018 and CT scan dated 06/23/2018 FINDINGS: Lateral view of the lumbar spine demonstrates an instrument under the lamina of L4. Previous L4 partial corpectomy and posterior fusion from L3-L5. IMPRESSION: Instrument under the lamina of L4. Electronically Signed   By: Lorriane Shire M.D.   On: 06/24/2018 11:20   Dg Abd Portable 1v  Result Date: 06/11/2018 CLINICAL DATA:  Orogastric tube placement. EXAM: PORTABLE ABDOMEN - 1 VIEW COMPARISON:  CT of earlier today FINDINGS: Nasogastric terminates at the body of the stomach. Bibasilar airspace disease, likely atelectasis. No gross free intraperitoneal air or bowel obstruction identified. IMPRESSION: Nasogastric terminating at the body of the stomach. Electronically Signed   By: Abigail Miyamoto M.D.   On: 06/11/2018 23:36   Dg C-arm 1-60 Min  Result Date: 06/13/2018 CLINICAL DATA:  Corpectomy and fixation of the lower lumbar spine. EXAM: DG C-ARM 61-120 MIN; LUMBAR SPINE - 2-3 VIEW COMPARISON:  CT 06/11/2018. FINDINGS: Posterior rod fixation with pedicle screws in the L3 and L5 vertebral bodies. L4 corpectomy with a spacer device. IMPRESSION: Posterior lumbar fusion and L4 corpectomy. Electronically Signed   By: Suzy Bouchard M.D.   On: 06/13/2018 13:59   Dg C-arm 1-60 Min  Result Date: 06/11/2018 CLINICAL DATA:  L4 corpectomy EXAM: LUMBAR SPINE - 2-3 VIEW; DG C-ARM 61-120 MIN COMPARISON:  MRI 06/04/2018 FINDINGS: Five low resolution intraoperative spot views of the lumbar spine. Total fluoroscopy time was 3 minutes 19 seconds. Images were obtained during L4 corpectomy and placement of interbody cage.  IMPRESSION: Intraoperative fluoroscopic assistance provided during lumbar spine surgery Electronically Signed   By: Madie Reno.D.  On: 06/11/2018 19:07   Dg Myelography Lumbar Inj Lumbosacral  Result Date: 06/02/2018 CLINICAL DATA:  Low back and bilateral leg pain. EXAM: LUMBAR MYELOGRAM FLUOROSCOPY TIME:  0 minutes 38 seconds. 142.58 micro gray meter squared PROCEDURE: After thorough discussion of risks and benefits of the procedure including bleeding, infection, injury to nerves, blood vessels, adjacent structures as well as headache and CSF leak, written and oral informed consent was obtained. Consent was obtained by Dr. [name]. Time out form was completed. Patient was positioned prone on the fluoroscopy table. Local anesthesia was provided with 1% lidocaine without epinephrine after prepped and draped in the usual sterile fashion. Puncture was performed at L4-5 using a 3 1/2 inch 22-gauge spinal needle via right paramedian approach. Using a single pass through the dura, the needle was placed within the thecal sac, with return of clear CSF. 15 mL of Isovue M-200 was injected into the thecal sac, with normal opacification of the nerve roots and cauda equina consistent with free flow within the subarachnoid space. I personally performed the lumbar puncture and administered the intrathecal contrast. I also personally performed acquisition of the myelogram images. TECHNIQUE: Contiguous axial images were obtained through the Lumbar spine after the intrathecal infusion of infusion. Coronal and sagittal reconstructions were obtained of the axial image sets. COMPARISON:  Radiography 04/13/2018 FINDINGS: LUMBAR MYELOGRAM FINDINGS: There is no compressive central canal stenosis. There is an anterior extradural defect at the L4 level with an abnormal appearance of the posterior L4 vertebral body. There appears to be lytic change of the bone, new since the radiography of October. Extradural defect is present  with some canal narrowing and lateral recess encroachment right more than left. L5-S1 shows disc space narrowing. Standing flexion extension views show perhaps lighted crease in prominence of the extradural defect at L4. No antero or retrolisthesis is seen. CT LUMBAR MYELOGRAM FINDINGS: No significant abnormality is seen at L2-3 or above. The vertebral bodies appear normal. The disc spaces are unremarkable. No compressive stenosis. At L4, there is a pathologic compression fracture with lytic destruction. There is loss of height posteriorly of 40%. Extraosseous tumor encroaches upon the spinal canal and both of the L4-5 intervertebral foramina, right more than left. Extraosseous tumor extends from the superior endplate of L4 down as far as the L4-5 disc. This is consistent with either metastatic carcinoma or myeloma. At L5-S1, there is chronic disc degeneration with vacuum phenomenon and bulging of the disc. There is bilateral facet degeneration. No central canal stenosis. There is bilateral foraminal narrowing right more than left. As shown at radiography, there is an infrarenal abdominal aortic aneurysm with transverse diameter maximal 3.8 cm. There are calcified gallstones within the gallbladder. No other regional finding. IMPRESSION: Pathologic fracture at L4 with maximal loss of height posteriorly of 40%. Lytic destruction of the L4 vertebral body. Extraosseous tumor encroaching upon the spinal canal at the L4 level, worse on the right than the left, with some foraminal encroachment worse on the right than the left at L4-5. This could be metastatic carcinoma or myeloma. Chronic degenerative disc disease at L5-S1 but without significant stenosis. Infrarenal abdominal aortic aneurysm with maximal transverse diameter of 3.8 cm. Recommend followup by ultrasound in 2 years. This recommendation follows ACR consensus guidelines: White Paper of the ACR Incidental Findings Committee II on Vascular Findings. J Am Coll  Radiol 2013; 10:789-794. Chololithiasis. These results will be called to the ordering clinician or representative by the Radiologist Assistant, and communication documented in the PACS  or zVision Dashboard. Electronically Signed   By: [name] M.D.   On: 06/02/2018 11:59   Ct Angio Abd/pel W/ And/or W/o  Result Date: 06/11/2018 CLINICAL DATA:  73 year old male with concern for postoperative bleeding during corpectomy L4. History of known metastatic disease, bronchogenic carcinoma EXAM: CT ANGIOGRAPHY ABDOMEN AND PELVIS WITH CONTRAST AND WITHOUT CONTRAST TECHNIQUE: Multidetector CT imaging of the abdomen and pelvis was performed using the standard protocol during bolus administration of intravenous contrast. Multiplanar reconstructed images and MIPs were obtained and reviewed to evaluate the vascular anatomy. CONTRAST:  132mL ISOVUE-370 IOPAMIDOL (ISOVUE-370) INJECTION 76% COMPARISON:  CT 06/04/2018, 06/02/2018 FINDINGS: VASCULAR Aorta: Mild atherosclerotic changes of the abdominal aorta. Infrarenal abdominal aortic aneurysm with the largest diameter measuring 3.5 cm near the IMA origin. No periaortic fluid. No dissection. Median sacral artery is of small caliber anterior to the sacrum, does not appear to contribute to hematoma of the retroperitoneum. Separate origin of bilateral L5 lumbar arteries. Contrast staining of the right paravertebral soft tissues/retroperitoneum/psoas muscle just lateral to the corpectomy site. Hematoma at this site measures 22 mm in AP diameter just lateral to the corpectomy hardware in the region of the tumor resection/treatment. Contrast staining within the psoas musculature, with the largest focus 10 mm. Celiac: Celiac artery patent with no significant atherosclerotic changes at the origin. Branches are patent. Replaced left hepatic artery. SMA: SMA patent with mild atherosclerotic changes at the origin, no significant stenosis. Branches are patent. Renals: Single right renal  artery with no significant atherosclerotic changes at the origin. The main left renal artery originates from the 3 o'clock position with minimal atherosclerotic changes. There is an accessory left renal artery from the 1 o'clock position to the superior pole. IMA: IMA patent Right lower extremity: Tortuous right iliac system. Mild atherosclerotic changes of the iliac system. Hypogastric artery is patent. External iliac artery patent. Common femoral artery patent with mild atherosclerotic changes. Proximal SFA and profunda femoris patent. Left lower extremity: Tortuous left iliac system. Mild atherosclerotic changes. Hypogastric artery is patent. External iliac artery patent. Mild atherosclerotic changes of the common femoral artery. Profunda femoris and SFA patent. Veins: Unremarkable appearance of the venous system. Review of the MIP images confirms the above findings. NON-VASCULAR Lower chest: Redemonstration of known right lower lobe mass with associated atelectasis/consolidation, incompletely imaged. Atelectasis at the dependent aspects of the left lower lobe. Redemonstration of right chest wall metastatic lesion centered within the angle of the right eighth rib. Greatest diameter on axial images measures 2.9 cm x 7.0 cm, relatively unchanged from the comparison CT. Hepatobiliary: Redemonstration of segment 2 metastatic lesion with heterogeneous enhancement measures 3.2 cm, slightly larger than the comparison CT, though potentially measurement difference secondary to technique. Questionable small lesion in the lateral right liver measures 4 mm-5 mm. Heterogeneous enhancement in the inferior aspects of the right liver of uncertain significance. Calcified stones within the gallbladder. Pancreas: Unremarkable pancreas Spleen: Unremarkable spleen Adrenals/Urinary Tract: Unremarkable adrenal glands Right: No hydronephrosis. Symmetric perfusion to the left. No nephrolithiasis. Unremarkable course of the right ureter.  Left: No hydronephrosis. Symmetric perfusion to the right. No nephrolithiasis. Unremarkable course of the left ureter. Balloon retention catheter in the urinary bladder. Air-fluid level within the urinary bladder, likely secondary to recent instrumentation. Stomach/Bowel: Distal esophagus is circumferentially thickened with hyperenhancement of the mucosal surface. Unremarkable stomach. Small bowel decompressed. No transition point or evidence of obstruction. Normal appendix colonic diverticula without evidence of acute inflammatory changes. Lymphatic: No lymphadenopathy Mesenteric: Postsurgical changes of the  left abdominal wall with surgical drain from the surgical site of L4 traversing the left psoas and the left retroperitoneum and left abdominal wall. Minimal gas within the right psoas and left psoas as well as the left retroperitoneum. Reproductive: Transverse prostate measures 5.4 cm Other: None Musculoskeletal: Postsurgical changes of L4 corpectomy with hardware placement centered at the vertebral body site. Metallic streak artifact somewhat obscures details at this site. Relatively unchanged appearance of soft tissue posterior to the surgical site at the anterior aspect of the canal, seen on prior myelogram to narrow the canal with. The hematoma on the right aspect of the vertebral body appears lateral and anterior to the canal. IMPRESSION: Interval postsurgical changes of L4 corpectomy with cage placement, and surgical drain from the left surgical site through the left retroperitoneum. Small hematoma in the retroperitoneum at the right lateral aspect of the spine extending into the psoas muscle, and in the setting of recent surgery could be venous or arterial. Similar appearance of soft tissue at the anterior aspect of the spinal canal at the surgical site, partially obscured by streak artifact on the current CT, and better characterized on the prior. Redemonstration of right right lower lobe lung mass and  metastatic disease involving liver, right eighth rib/chest wall. Heterogeneous appearance of the right liver lobe, segment 6, on the venous phase could represent additional small metastatic lesions versus heterogeneous attenuation/perfusion. Circumferential esophageal thickening in the lower thoracic esophagus. Recommend correlation with history of GERD/esophagitis. Infrarenal abdominal aortic aneurysm measuring 3.5 cm. Aortic aneurysm NOS (ICD10-I71.9). Aortic Atherosclerosis (ICD10-I70.0). Electronically Signed   By: Corrie Mckusick D.O.   On: 06/11/2018 21:59    Discharge Instructions    Incentive spirometry RT   Complete by:  As directed    Incentive spirometry RT   Complete by:  As directed    Incentive spirometry RT   Complete by:  As directed         Discharge Plan:  discharge to CIR  Disposition: Stable    Signed: Kaleah Hagemeister D for Dr. Melina Schools [facility] [phone] 06/29/2018, 8:51 AM

## 2018-06-29 NOTE — Progress Notes (Signed)
Physical Medicine and Rehabilitation Consult   Reason for Consult: Functional deficits due to L4 pathalogic fracture due to metastic lung cancer.  Referring Physician: Dr. Prudence Davidson.    HPI: Brian [name] is a 73 y.o. male with history of RA, emphysema, tobacco abuse with low back and hip pain. Work up done revealing of bronchogenic RLL lung cancer with bony mets to left clavicle, right 8th rib, L4 vertebral body with fracture and 2.5 cm liver lesion. He was admitted form office for work up on 06/06/18 and surgical intervention of pathologic fracture recommended by Dr. Marin Olp. He underwent L4 corpectomy with L3-L5 anterior fusion by Dr.Brooks on 12/20. Perioperatively with blood loss with hematoma at corpectomy site requiring 4 units  PRBC and 2 units FFP. Surgery stopped due to bleeding and CTA abdomen/pelvis revealed small retroperitoneal hematoma win right lateral spine extending to psoas muscle. Once stabilized, he was taken back to OR on 12/22 for screw fixation L3 - L5.   Pathology reveled metastatic squamous cell lung cancer and plans for XRT in the future.  H/H being monitored and he continues on full liquid diet due to constipation. Post op with BLE weakness as well as pain affecting mobility and self care tasks. He was able to sit up in a chair for an hour today. CIR recommended due to functional deficits.    Review of Systems  Constitutional: Negative for chills and fever.  HENT: Negative for hearing loss and tinnitus.   Eyes: Negative for blurred vision and double vision.  Respiratory: Positive for cough and shortness of breath.   Cardiovascular: Positive for leg swelling. Negative for chest pain and palpitations.  Gastrointestinal: Positive for constipation and nausea.  Musculoskeletal: Positive for joint pain (back radiating to RLE and left shoulder).  Skin: Negative for rash.  Neurological: Positive for sensory change (RLE for 6 weeks PTA) and focal weakness. Negative  for dizziness.  Psychiatric/Behavioral: The patient is nervous/anxious.           Past Medical History:  Diagnosis Date  . Arthritis          Past Surgical History:  Procedure Laterality Date  . ANTERIOR CERVICAL CORPECTOMY Left 06/11/2018   Procedure: L4 Corpectomy, L3-5 anterior reconstruction,;  Surgeon: Melina Schools, MD;  Location: [facility];  Service: Orthopedics;  Laterality: Left;  6.5 hrs- Ok for this day/time per April  . CATARACT EXTRACTION, BILATERAL           Family History  Problem Relation Age of Onset  . Diabetes Mother   . Heart attack Father   . Obesity Father   . Diabetes Sister      Social History:  Married. Retired Engineer, maintenance (IT). Independent with use of walker for a couple of months. Wife supportive.  He reports that he has been smoking cigarettes. He has been smoking about 0.25 packs per day. He has never used smokeless tobacco. He reports that he does not use drugs. No history on file for alcohol.         Allergies  Allergen Reactions  . Minoxidil Palpitations  . Nicotine Palpitations  . Sulfamethoxazole Other (See Comments)    Both parents allergic, avoids med          Medications Prior to Admission  Medication Sig Dispense Refill  . aspirin 81 MG chewable tablet Chew 81 mg by mouth daily.    . Calcium Carb-Cholecalciferol (CALCIUM 600/VITAMIN D3) 600-800 MG-UNIT TABS Take 1 tablet by mouth daily.    Marland Kitchen docusate sodium (  COLACE) 100 MG capsule Take 100 mg by mouth 2 (two) times daily as needed for mild constipation.    . folic acid (FOLVITE) 1 MG tablet Take 1 mg by mouth daily.    . Homeopathic Products (T-RELIEF PAIN RELIEF) CREA Apply 1 application topically 2 (two) times daily. Hip/Back pain    . HYDROmorphone (DILAUDID) 4 MG tablet Take 4 mg by mouth every 6 (six) hours as needed for severe pain.    . hydroxypropyl methylcellulose / hypromellose (ISOPTO TEARS / GONIOVISC) 2.5 % ophthalmic solution Place 1-2 drops into  both eyes as needed for dry eyes.    Marland Kitchen ibuprofen (ADVIL,MOTRIN) 200 MG tablet Take 400 mg by mouth every 6 (six) hours as needed for headache or mild pain.    Marland Kitchen Lysine 500 MG CAPS Take 500 mg by mouth daily as needed (chapped lips).    . Omega-3 350 MG CPDR Take 350 mg by mouth daily.    . Potassium 99 MG TABS Take 99 mg by mouth daily as needed (leg cramps).    . pseudoephedrine (SUDAFED) 120 MG 12 hr tablet Take 120 mg by mouth every 12 (twelve) hours as needed for congestion.      Home: Home Living Family/patient expects to be discharged to:: Private residence Living Arrangements: Spouse/significant other Available Help at Discharge: Family, Available 24 hours/day Type of Home: House Home Access: Stairs to enter [employer] of Steps: 6 Entrance Stairs-Rails: Right Home Layout: One level Bathroom Shower/Tub: Multimedia programmer: Lely: Environmental consultant - 2 wheels, Grab bars - tub/shower, Shower seat, Hand held shower head, Adaptive equipment(frame for toilet that are just "handles" not a BSC) Adaptive Equipment: Reacher Additional Comments: two cats- within the home but 10 total in the house but are in a special room in the house  Functional History: Prior Function Level of Independence: Needs assistance Gait / Transfers Assistance Needed: walker at baseline ADL's / Homemaking Assistance Needed: sitting with wife helping with back and buttock Functional Status:  Mobility: Bed Mobility Overal bed mobility: Needs Assistance Bed Mobility: Rolling, Sidelying to Sit Rolling: Mod assist Sidelying to sit: Mod assist, +2 for physical assistance Supine to sit: Max assist Sit to supine: Max assist, +2 for physical assistance Sit to sidelying: +2 for physical assistance General bed mobility comments: Cues for log roll technique; Mod assist to elevate trunk sidelying to sit Transfers Overall transfer level: Needs assistance Equipment used: 2  person hand held assist(and bed pad cradling hips initially) Transfer via Lift Equipment: Stedy Transfers: Sit to/from Stand, [employer] Transfers Sit to Stand: Mod assist, +2 physical assistance, +2 safety/equipment Stand pivot transfers: Max assist, +2 physical assistance, +2 safety/equipment General transfer comment: Bed elevated; Initial sit to stand went quite well, with bilateral mod assist to rise; More difficulty with dynamic motion, weight shift, and stance stabiltiy to take small steps to chair; significant knee buckling, requiring knees to be blocked to stability during transfer Ambulation/Gait General Gait Details: unable  ADL: ADL Overall ADL's : Needs assistance/impaired Eating/Feeding: Set up Grooming: Set up, Supervision/safety, Wash/dry hands, Brushing hair, Sitting Upper Body Bathing: Moderate assistance Lower Body Bathing: Total assistance Upper Body Dressing : Maximal assistance Lower Body Dressing: Total assistance Toilet Transfer: +2 for physical assistance, Maximal assistance, BSC Toilet Transfer Details (indicate cue type and reason): elevated surface required and unable to sustain static standing. simulated sit<.stand eob. Recommend BSC placed behind patient with bed swung away Toileting- Clothing Manipulation and Hygiene: Total assistance, +2 for  physical assistance Functional mobility during ADLs: Maximal assistance, +2 for physical assistance General ADL Comments: Stedy used for transfer; educated pt on back precautions; handout issued; total A to donn brace; pt's attention limited by pain  Cognition: Cognition Overall Cognitive Status: Within Functional Limits for tasks assessed Orientation Level: Oriented X4 Cognition Arousal/Alertness: Awake/alert Behavior During Therapy: WFL for tasks assessed/performed Overall Cognitive Status: Within Functional Limits for tasks assessed  Blood pressure 130/80, pulse (!) 133, temperature 97.8 F (36.6 C),  temperature source Oral, resp. rate 19, height 5\' 10"  (1.778 m), weight 93.8 kg, SpO2 (!) 89 %. Physical Exam  Nursing note and vitals reviewed. Constitutional: He is oriented to person, place, and time. He appears well-developed and well-nourished.  Continues to report pain.  Anxious and easily frustrated.    Neurological: He is alert and oriented to person, place, and time.         Results for orders placed or performed during the hospital encounter of 06/04/18 (from the past 24 hour(s))  CBC with Differential/Platelet     Status: Abnormal   Collection Time: 06/17/18  3:45 AM  Result Value Ref Range   WBC 11.3 (H) 4.0 - 10.5 K/uL   RBC 2.63 (L) 4.22 - 5.81 MIL/uL   Hemoglobin 7.7 (L) 13.0 - 17.0 g/dL   HCT 24.6 (L) 39.0 - 52.0 %   MCV 93.5 80.0 - 100.0 fL   MCH 29.3 26.0 - 34.0 pg   MCHC 31.3 30.0 - 36.0 g/dL   RDW 14.9 11.5 - 15.5 %   Platelets 284 150 - 400 K/uL   nRBC 0.0 0.0 - 0.2 %   Neutrophils Relative % 78 %   Neutro Abs 8.9 (H) 1.7 - 7.7 K/uL   Lymphocytes Relative 12 %   Lymphs Abs 1.3 0.7 - 4.0 K/uL   Monocytes Relative 7 %   Monocytes Absolute 0.8 0.1 - 1.0 K/uL   Eosinophils Relative 1 %   Eosinophils Absolute 0.1 0.0 - 0.5 K/uL   Basophils Relative 0 %   Basophils Absolute 0.0 0.0 - 0.1 K/uL   Immature Granulocytes 2 %   Abs Immature Granulocytes 0.23 (H) 0.00 - 0.07 K/uL   No results found.   Assessment/Plan: Diagnosis: Functional deficits secondary to diffusely metastatic lung cancer which has involved L4 vertebral body. Pt underwent L4 corpectomy and fusion.  1. Does the need for close, 24 hr/day medical supervision in concert with the patient's rehab needs make it unreasonable for this patient to be served in a less intensive setting? Yes 2. Co-Morbidities requiring supervision/potential complications: pain mgt, oncological considerations, post-op considerations 3. Due to bladder management, bowel management, safety,  skin/wound care, disease management, medication administration, pain management and patient education, does the patient require 24 hr/day rehab nursing? Yes 4. Does the patient require coordinated care of a physician, rehab nurse, PT (1-2 hrs/day, 5 days/week) and OT (1-2 hrs/day, 5 days/week) to address physical and functional deficits in the context of the above medical diagnosis(es)? Yes Addressing deficits in the following areas: balance, endurance, locomotion, strength, transferring, bowel/bladder control, bathing, dressing, feeding, grooming and toileting 5. Can the patient actively participate in an intensive therapy program of at least 3 hrs of therapy per day at least 5 days per week? Yes 6. The potential for patient to make measurable gains while on inpatient rehab is excellent 7. Anticipated functional outcomes upon discharge from inpatient rehab are supervision and min assist  with PT, supervision and min assist with OT, n/a with  SLP. 8. Estimated rehab length of stay to reach the above functional goals is: 15-20 days 9. Anticipated D/C setting: Home 10. Anticipated post D/C treatments: [place] therapy 11. Overall Rehab/Functional Prognosis: excellent  RECOMMENDATIONS: This patient's condition is appropriate for continued rehabilitative care in the following setting: CIR Patient has agreed to participate in recommended program. Yes Note that insurance prior authorization may be required for reimbursement for recommended care.  Comment: Rehab Admissions Coordinator to follow up.  Thanks,  Meredith Staggers, MD, Mellody Drown      Bary Leriche, PA-C 06/17/2018        Revision History                        Routing History

## 2018-06-30 ENCOUNTER — Inpatient Hospital Stay (HOSPITAL_COMMUNITY): Payer: Medicare Other | Admitting: Physical Therapy

## 2018-06-30 ENCOUNTER — Inpatient Hospital Stay (HOSPITAL_COMMUNITY): Payer: Medicare Other

## 2018-06-30 ENCOUNTER — Inpatient Hospital Stay (HOSPITAL_COMMUNITY): Payer: Medicare Other | Admitting: Occupational Therapy

## 2018-06-30 ENCOUNTER — Encounter (HOSPITAL_COMMUNITY): Payer: Self-pay | Admitting: Physician Assistant

## 2018-06-30 DIAGNOSIS — D62 Acute posthemorrhagic anemia: Secondary | ICD-10-CM

## 2018-06-30 DIAGNOSIS — C7801 Secondary malignant neoplasm of right lung: Secondary | ICD-10-CM | POA: Insufficient documentation

## 2018-06-30 DIAGNOSIS — I82409 Acute embolism and thrombosis of unspecified deep veins of unspecified lower extremity: Secondary | ICD-10-CM

## 2018-06-30 DIAGNOSIS — C3490 Malignant neoplasm of unspecified part of unspecified bronchus or lung: Secondary | ICD-10-CM

## 2018-06-30 DIAGNOSIS — G8918 Other acute postprocedural pain: Secondary | ICD-10-CM

## 2018-06-30 DIAGNOSIS — E46 Unspecified protein-calorie malnutrition: Secondary | ICD-10-CM

## 2018-06-30 DIAGNOSIS — E876 Hypokalemia: Secondary | ICD-10-CM

## 2018-06-30 HISTORY — PX: IR IVC FILTER PLMT / S&I /IMG GUID/MOD SED: IMG701

## 2018-06-30 LAB — CBC WITH DIFFERENTIAL/PLATELET
Abs Immature Granulocytes: 0.21 10*3/uL — ABNORMAL HIGH (ref 0.00–0.07)
BASOS ABS: 0.1 10*3/uL (ref 0.0–0.1)
Basophils Relative: 1 %
Eosinophils Absolute: 0.8 10*3/uL — ABNORMAL HIGH (ref 0.0–0.5)
Eosinophils Relative: 7 %
HCT: 29.6 % — ABNORMAL LOW (ref 39.0–52.0)
Hemoglobin: 9.3 g/dL — ABNORMAL LOW (ref 13.0–17.0)
Immature Granulocytes: 2 %
Lymphocytes Relative: 25 %
Lymphs Abs: 2.6 10*3/uL (ref 0.7–4.0)
MCH: 29.4 pg (ref 26.0–34.0)
MCHC: 31.4 g/dL (ref 30.0–36.0)
MCV: 93.7 fL (ref 80.0–100.0)
Monocytes Absolute: 0.8 10*3/uL (ref 0.1–1.0)
Monocytes Relative: 7 %
Neutro Abs: 5.9 10*3/uL (ref 1.7–7.7)
Neutrophils Relative %: 58 %
Platelets: 373 10*3/uL (ref 150–400)
RBC: 3.16 MIL/uL — AB (ref 4.22–5.81)
RDW: 16.1 % — ABNORMAL HIGH (ref 11.5–15.5)
WBC: 10.3 10*3/uL (ref 4.0–10.5)
nRBC: 0 % (ref 0.0–0.2)

## 2018-06-30 LAB — COMPREHENSIVE METABOLIC PANEL
ALT: 30 U/L (ref 0–44)
ANION GAP: 7 (ref 5–15)
AST: 32 U/L (ref 15–41)
Albumin: 1.8 g/dL — ABNORMAL LOW (ref 3.5–5.0)
Alkaline Phosphatase: 85 U/L (ref 38–126)
BUN: 7 mg/dL — ABNORMAL LOW (ref 8–23)
CHLORIDE: 106 mmol/L (ref 98–111)
CO2: 22 mmol/L (ref 22–32)
Calcium: 7.2 mg/dL — ABNORMAL LOW (ref 8.9–10.3)
Creatinine, Ser: 0.65 mg/dL (ref 0.61–1.24)
GFR calc Af Amer: >60 mL/min (ref >60)
GFR calc non Af Amer: >60 mL/min (ref >60)
GLUCOSE: 115 mg/dL — AB (ref 70–99)
Potassium: 3 mmol/L — ABNORMAL LOW (ref 3.5–5.1)
Sodium: 135 mmol/L (ref 135–145)
Total Bilirubin: 1.3 mg/dL — ABNORMAL HIGH (ref 0.3–1.2)
Total Protein: 5.3 g/dL — ABNORMAL LOW (ref 6.5–8.1)

## 2018-06-30 LAB — MAGNESIUM: Magnesium: 1.7 mg/dL (ref 1.7–2.4)

## 2018-06-30 MED ORDER — LIDOCAINE HCL 1 % IJ SOLN
INTRAMUSCULAR | Status: AC | PRN
  Administered 2018-06-30: 10 mL

## 2018-06-30 MED ORDER — FENTANYL CITRATE (PF) 100 MCG/2ML IJ SOLN
INTRAMUSCULAR | Status: AC | PRN
  Administered 2018-06-30: 50 ug via INTRAVENOUS

## 2018-06-30 MED ORDER — CAMPHOR-MENTHOL 0.5-0.5 % EX LOTN: TOPICAL_LOTION | CUTANEOUS | 0 refills | Status: AC | PRN

## 2018-06-30 MED ORDER — IOPAMIDOL (ISOVUE-300) INJECTION 61%
INTRAVENOUS | Status: AC
  Administered 2018-06-30: 70 mL
  Filled 2018-06-30: qty 100

## 2018-06-30 MED ORDER — HYDROCODONE-ACETAMINOPHEN 7.5-325 MG PO TABS
1.0000 | ORAL_TABLET | Freq: Four times a day (QID) | ORAL | Status: DC | PRN
  Administered 2018-06-30 – 2018-07-07 (×21): 1 via ORAL
  Filled 2018-06-30 (×21): qty 1

## 2018-06-30 MED ORDER — ACETAMINOPHEN 325 MG PO TABS: 325.0000 mg | ORAL_TABLET | ORAL | Status: AC | PRN

## 2018-06-30 MED ORDER — FENTANYL CITRATE (PF) 100 MCG/2ML IJ SOLN
INTRAMUSCULAR | Status: AC
  Administered 2018-06-30: 16:00:00
  Filled 2018-06-30: qty 4

## 2018-06-30 MED ORDER — HYDROMORPHONE HCL 1 MG/ML PO LIQD
1.0000 mg | ORAL | Status: DC | PRN
  Administered 2018-07-04 – 2018-07-07 (×8): 1 mg via ORAL
  Filled 2018-06-30 (×8): qty 1

## 2018-06-30 MED ORDER — MIDAZOLAM HCL 2 MG/2ML IJ SOLN
INTRAMUSCULAR | Status: AC | PRN
  Administered 2018-06-30 (×2): 1 mg via INTRAVENOUS

## 2018-06-30 MED ORDER — POTASSIUM CHLORIDE CRYS ER 20 MEQ PO TBCR
20.0000 meq | EXTENDED_RELEASE_TABLET | Freq: Every day | ORAL | Status: DC
  Administered 2018-07-01 – 2018-07-05 (×5): 20 meq via ORAL
  Filled 2018-06-30 (×5): qty 1

## 2018-06-30 MED ORDER — SODIUM CHLORIDE 0.9 % IV SOLN
INTRAVENOUS | Status: AC | PRN
  Administered 2018-06-30: 10 mL/h via INTRAVENOUS

## 2018-06-30 MED ORDER — LIDOCAINE HCL 1 % IJ SOLN
INTRAMUSCULAR | Status: AC
  Administered 2018-06-30: 15:00:00
  Filled 2018-06-30: qty 20

## 2018-06-30 MED ORDER — MIDAZOLAM HCL 2 MG/2ML IJ SOLN
INTRAMUSCULAR | Status: AC
  Administered 2018-06-30: 16:00:00
  Filled 2018-06-30: qty 4

## 2018-06-30 NOTE — IPOC Note (Signed)
Overall Plan of Care [facility]) Patient Details Name: Brian [name] MRN: [id number] DOB: [birth year]  Admitting Diagnosis: Lumbar compression fracture status post fusion/decompression, with acute DVT.  Hospital Problems: Active Problems:   Pathological compression fracture of lumbar vertebra (HCC)   DVT of lower extremity (deep venous thrombosis) (HCC)   Malignant neoplasm of lung (HCC)   Hypoalbuminemia due to protein-calorie malnutrition (HCC)   Acute blood loss anemia   Postoperative pain     Functional Problem List: Nursing Bladder, Bowel, Endurance, Pain, Safety, Skin Integrity  PT Balance, Edema, Endurance, Motor, Pain  OT Balance, Vision, Behavior, Pain, Perception, Safety, Endurance  SLP    TR         Basic ADL's: OT Grooming, Dressing, Bathing, Toileting     Advanced  ADL's: OT       Transfers: PT Bed Mobility, Bed to Chair, Car, Manufacturing systems engineer, Metallurgist: PT Ambulation, Emergency planning/management officer, Stairs     Additional Impairments: OT None  SLP        TR      Anticipated Outcomes Item Anticipated Outcome  Self Feeding Indep.   Swallowing      Basic self-care  Min A  Toileting  Min A   Bathroom Transfers Min A  Bowel/Bladder  Continent to bowel and bladder with mod. assist.  Transfers  min assist basic and car  Locomotion  S w/c x 150' controlled and 50' home; S gait x 73' with LRAD and min assist up/down 4 steps +threshold  R rail  Communication     Cognition     Pain  Less than 3,on 1 to 10 scale.  Safety/Judgment  Free from falls during his stay in rehab.   Therapy Plan: PT Intensity: Minimum of 1-2 x/day ,45 to 90 minutes PT Frequency: 5 out of 7 days PT Duration Estimated Length of Stay: 15-21 days OT Intensity: Minimum of 1-2 x/day, 45 to 90 minutes OT Frequency: 5 out of 7 days OT Duration/Estimated Length of Stay: 2-2.5 weeks      Team Interventions: Nursing Interventions Patient/Family Education, Pain  Management, Bladder Management, Discharge Planning, Bowel Management, Skin Care/Wound Management, Disease Management/Prevention  PT interventions Ambulation/gait training, Discharge planning, Functional mobility training, Psychosocial support, Therapeutic Activities, Balance/vestibular training, Neuromuscular re-education, Therapeutic Exercise, Wheelchair propulsion/positioning, DME/adaptive equipment instruction, Pain management, Splinting/orthotics, UE/LE Strength taining/ROM, Community reintegration, Barrister's clerk education, IT trainer, UE/LE Coordination activities  OT Interventions Training and development officer, Discharge planning, DME/adaptive equipment instruction, Functional mobility training, Neuromuscular re-education, Patient/family education, Pain management, Psychosocial support, Self Care/advanced ADL retraining, Skin care/wound managment, Therapeutic Activities, Therapeutic Exercise, UE/LE Coordination activities, UE/LE Strength taining/ROM, Wheelchair propulsion/positioning  SLP Interventions    TR Interventions    SW/CM Interventions Discharge Planning, Psychosocial Support, Patient/Family Education   Barriers to Discharge MD  Medical stability, Wound care, Pending chemo/radiation and DVT  Nursing      PT      OT Inaccessible home environment, Home environment access/layout, Medical stability Multi-level home. Cancer dx  SLP      SW       Team Discharge Planning: Destination: PT-Home ,OT- Home , SLP-  Projected Follow-up: PT-Home health PT, OT-  Home health OT, SLP-  Projected Equipment Needs: PT-To be determined, OT- To be determined, SLP-  Equipment Details: PT- , OT-  Patient/family involved in discharge planning: PT- Patient, Family member/caregiver,  OT-Patient, Family member/caregiver, SLP-   MD ELOS: 16-19 days. Medical Rehab Prognosis:  Good Assessment: 73 year old male with  history of RA, emphysema, tobacco abuse with low back and hip pain. Work-up done  revealing bronchogenic RLL lung cancer with bony mets to left clavicle, right eighth rib, L4 vertebral body with fracture and 2.5 cm liver lesion. He was admitted from office for work-up on 1May 08, 202019 and surgical intervention of pathological fracture recommended by Dr. Marin Olp. He underwent L4 corpectomy with L3-L5 fusion by Dr. Rolena Infante on 12/20. Perioperatively had blood loss anemia with hematoma at corpectomy site requiring 4 units PRBC and 2 units FFP. Surgery stopped due to bleeding and CT abdomen/pelvis revealed small retroperitoneal hematoma extending to psoas muscle. Once stabilized, he was taken back to the OR on 12/12 for screw fixation L3-L5. Pathology revealed metastatic squamous cell lung cancer with plans for HRT in future. He continuedto have issues with severe pain as well as bilateral lower extremity weakness. Follow up MRI spine 12/27 revealed retroperitoneal hematoma, s/p corpectomy with L3-5 fusion and unchanged severe spinal canal stenosis L4 level. He hasrequired multiple units packed red blood cells as well as IV iron for anemia. Follow-up CT lumbar spine 1/1revealed large right and small left retroperitoneal hematomas expanding psoas muscle--unchanged. Rash on back stable. Palliative care consulted for symptom management as well as GOC. Fentanyl and dilaudid added for pain control and due to ongoing issues with neuropathy was taken back to OR on 06/24/18 for L4/5 Gill decompression. On evaluation patient noted to have edema, vascular ultrasound ordered showing acute left common femoral DVT.  Patient with resulting functional deficits with mobility, endurance, self-care.  We will set goals for Min A with PT/OT.  See Team Conference Notes for weekly updates to the plan of care

## 2018-06-30 NOTE — Progress Notes (Signed)
Daily Progress Note   Patient Name: Brian [name]       Date: 06/30/2018 DOB: March 10, 1946  Age: 73 y.o. MRN#: [id number] Attending Physician: Jamse Arn, MD Primary Care Physician: [name], MD Admit Date: 06/29/2018  Reason for Consultation/Follow-up: Establishing goals of care  Subjective: Patient is resting in bed. Wife and daughter are at bedside. He and his family state his pain has been controlled if he received the medications in a timely fashion. He states at times Tylenol or Vicodin would control his pain without the need for Dilaudid, however his pain increases to a place of severe pain. We discussed premedicating before therapy.  We discussed his diagnoses.   Upon leaving room for IVC filter, his family voiced concerns for him giving up with any negative news. We discussed goals of care.  They would like to proceed with a [place] conversation as his wife is the POA, but states they have never discussed his wishes. He is very independent and does not like having to rely on family to assist with ADL's. The family would like to explore quality of life.  Length of Stay: 1  Current Medications: Scheduled Meds:  . clobetasol ointment   Topical BID  . feeding supplement (PRO-STAT SUGAR FREE 64)  30 mL Oral BID  . fentaNYL  12.5 mcg Transdermal Q72H  . fentaNYL      . gabapentin  300 mg Oral TID  . guaiFENesin  10 mL Oral Q6H  . lidocaine      . midazolam      . pantoprazole  40 mg Oral Daily  . potassium chloride  20 mEq Oral Daily  . senna  1 tablet Oral QHS    Continuous Infusions: . sodium chloride 10 mL/hr (06/30/18 1505)    PRN Meds: sodium chloride, acetaminophen, albuterol, alum & mag hydroxide-simeth, bisacodyl, camphor-menthol, diphenhydrAMINE, fentaNYL,  guaiFENesin-dextromethorphan, HYDROcodone-acetaminophen, HYDROmorphone HCl, lidocaine, methocarbamol, midazolam, polyethylene glycol, prochlorperazine **OR** prochlorperazine **OR** prochlorperazine, sodium phosphate, traZODone  Physical Exam Pulmonary:     Effort: Pulmonary effort is normal.  Skin:    General: Skin is warm and dry.             Vital Signs: BP 136/77   Pulse 93   Temp (!) 97.5 F (36.4 C) (Oral)   Resp 20  Ht 5\' 10"  (1.778 m)   Wt 96.8 kg   SpO2 94%   BMI 30.62 kg/m  SpO2: SpO2: 94 % O2 Device: O2 Device: Nasal Cannula O2 Flow Rate: O2 Flow Rate (L/min): 2 L/min  Intake/output summary:   Intake/Output Summary (Last 24 hours) at 06/30/2018 1622 Last data filed at 06/30/2018 1505 Gross per 24 hour  Intake 0 ml  Output 1325 ml  Net -1325 ml   LBM: Last BM Date: 06/29/18 Baseline Weight: Weight: 96.8 kg Most recent weight: Weight: 96.8 kg       Palliative Assessment/Data: 30%      Patient Active Problem List   Diagnosis Date Noted  . DVT of lower extremity (deep venous thrombosis) ([place])   . Malignant neoplasm of lung ([place])   . Hypoalbuminemia due to protein-calorie malnutrition ([place])   . Acute blood loss anemia   . Postoperative pain   . Pathological compression fracture of lumbar vertebra ([place]) 06/29/2018  . Metastatic lung cancer (metastasis from lung to other site) ([place]) 06/28/2018  . Rash 06/25/2018  . Hypokalemia 06/25/2018  . Normocytic anemia 06/25/2018  . S/P lumbar fusion 06/24/2018  . Palliative care by specialist   . Cancer related pain   . Lung cancer, primary, with metastasis from lung to other site, right ([place]) 06/14/2018  . Squamous cell lung cancer, right ([place]) 06/14/2018  . Lung cancer metastatic to bone ([place]) 06/14/2018  . Bronchitis due to tobacco use 06/07/2018  . Lumbar spine tumor 06/04/2018    Palliative Care Assessment & Plan   Recommendations/Plan:  [place] conversation tomorrow.   Recommend Fentanyl patch for  sustained pain control.  Recommend Tylenol for mild pain. Recommend Norco for moderate pain. Recommend Dilaudid for severe pain.  Recommend Robaxin for muscle spasms. Recommend increasing Neurontin as needed for neuropathic pain.   Code Status:    Code Status Orders  (From admission, onward)         Start     Ordered   06/29/18 1526  Full code  Continuous     06/29/18 1525        Code Status History    Date Active Date Inactive Code Status Order ID Comments User Context   06/11/2018 2031 06/29/2018 1440 Full Code [id number]  Milagros Loll, MD Inpatient   06/11/2018 2011 06/11/2018 2030 Full Code [id number]  Melina Schools, MD Inpatient   06/04/2018 1344 06/11/2018 2011 Full Code [id number]  Ardeen Jourdain, PA-C Inpatient       Prognosis:  Poor overall. Metastatic cancer with pathologic fracture diagnosed in December.   Discharge Planning:  To Be Determined  Care plan was discussed with RN  Thank you for allowing the Palliative Medicine Team to assist in the care of this patient.   Time In: 2:20 Time Out: 4:30 Total Time 2 hours 10 min Prolonged Time Billed  yes       Greater than 50%  of this time was spent counseling and coordinating care related to the above assessment and plan.  Asencion Gowda, NP  Please contact Palliative Medicine Team phone at [phone] for questions and concerns.

## 2018-06-30 NOTE — Progress Notes (Signed)
[place] PHYSICAL MEDICINE & REHABILITATION PROGRESS NOTE  Subjective/Complaints: Patient seen laying in bed this morning.  He states he slept fairly overnight but was repeatedly interrupted with people coming in and out of his room.  Discussed DVT with patient.  ROS: Denies chest pain, shortness of breath, nausea, vomiting, diarrhea.  Objective: Vital Signs: Blood pressure 131/82, pulse 100, temperature (!) 97.5 F (36.4 C), temperature source Oral, resp. rate 18, height 5\' 10"  (1.778 m), weight 96.8 kg, SpO2 91 %. Vas [place] Lower Extremity Venous (dvt)  Result Date: 06/29/2018  Lower Venous Study Indications: Immobility.  Performing Technologist: Oliver Hum RVT  Examination Guidelines: A complete evaluation includes B-mode imaging, spectral Doppler, color Doppler, and power Doppler as needed of all accessible portions of each vessel. Bilateral testing is considered an integral part of a complete examination. Limited examinations for reoccurring indications may be performed as noted.  Right Venous Findings: +---------+---------------+---------+-----------+----------+-------+          CompressibilityPhasicitySpontaneityPropertiesSummary +---------+---------------+---------+-----------+----------+-------+ CFV      Full           Yes      Yes                          +---------+---------------+---------+-----------+----------+-------+ SFJ      Full                                                 +---------+---------------+---------+-----------+----------+-------+ FV Prox  Full                                                 +---------+---------------+---------+-----------+----------+-------+ FV Mid   Full                                                 +---------+---------------+---------+-----------+----------+-------+ FV DistalFull                                                 +---------+---------------+---------+-----------+----------+-------+ PFV       Full                                                 +---------+---------------+---------+-----------+----------+-------+ POP      Full           Yes      Yes                          +---------+---------------+---------+-----------+----------+-------+ PTV      Full                                                 +---------+---------------+---------+-----------+----------+-------+ PERO  Full                                                 +---------+---------------+---------+-----------+----------+-------+  Left Venous Findings: +---------+---------------+---------+-----------+----------+--------------+          CompressibilityPhasicitySpontaneityPropertiesSummary        +---------+---------------+---------+-----------+----------+--------------+ CFV      Partial        Yes      Yes                  Acute          +---------+---------------+---------+-----------+----------+--------------+ SFJ      Full                                                        +---------+---------------+---------+-----------+----------+--------------+ FV Prox  Full                                                        +---------+---------------+---------+-----------+----------+--------------+ FV Mid   Full                                                        +---------+---------------+---------+-----------+----------+--------------+ FV DistalFull                                                        +---------+---------------+---------+-----------+----------+--------------+ PFV      Full                                                        +---------+---------------+---------+-----------+----------+--------------+ POP      Full           Yes      Yes                                 +---------+---------------+---------+-----------+----------+--------------+ PTV      Full                                                         +---------+---------------+---------+-----------+----------+--------------+ PERO     Full                                                        +---------+---------------+---------+-----------+----------+--------------+  EIV                     Yes      Yes                                 +---------+---------------+---------+-----------+----------+--------------+ CIV                                                   Not visualized +---------+---------------+---------+-----------+----------+--------------+    Summary: Right: There is no evidence of deep vein thrombosis in the lower extremity. No cystic structure found in the popliteal fossa. Left: Findings consistent with acute deep vein thrombosis involving the left common femoral vein. No cystic structure found in the popliteal fossa. Unable to visualize the common iliac vein due to bowel gas.  *See table(s) above for measurements and observations. Electronically signed by Sherren Mocha MD on 06/29/2018 at 8:05:33 PM.    Final    Recent Labs    06/30/18 0619  WBC 10.3  HGB 9.3*  HCT 29.6*  PLT 373   Recent Labs    06/30/18 0619  NA 135  K 3.0*  CL 106  CO2 22  GLUCOSE 115*  BUN 7*  CREATININE 0.65  CALCIUM 7.2*    Physical Exam: BP 131/82 (BP Location: Left Arm)   Pulse 100   Temp (!) 97.5 F (36.4 C) (Oral)   Resp 18   Ht 5\' 10"  (1.778 m)   Wt 96.8 kg   SpO2 91%   BMI 30.62 kg/m  Constitutional: No distress . Vital signs reviewed. HENT: Normocephalic.  Atraumatic. Eyes: EOMI. No discharge. Cardiovascular: RRR. No JVD. Respiratory: CTA Bilaterally. Normal effort. GI: BS +. Non-distended. Musculoskeletal: Bilateral lower extremity edema  Neurological: He is alertand oriented  Dysphonia  Able to follow basic commands without difficulty.  Motor: Bilateral UE 5/5 proximal to distal Bilateral lower extremities: 4-/5HF, 4/5 KE and 4/5 ADF/PF.  Skin: Warmand dry. Dressings in place Psychiatric: His  behavior is normal.Judgmentand thought contentnormal. His affect is blunt.  Assessment/Plan: 1. Functional deficits secondary to lung cancer with mets to ribs, vertebrae with oncological fracture status post lumbar fusion which require 3+ hours per day of interdisciplinary therapy in a comprehensive inpatient rehab setting.  Physiatrist is providing close team supervision and 24 hour management of active medical problems listed below.  Physiatrist and rehab team continue to assess barriers to discharge/monitor patient progress toward functional and medical goals  Care Tool:  Bathing              Bathing assist       Upper Body Dressing/Undressing Upper body dressing        Upper body assist      Lower Body Dressing/Undressing Lower body dressing            Lower body assist       Toileting Toileting    Toileting assist Assist for toileting: Moderate Assistance - Patient 50 - 74%     Transfers Chair/bed transfer  Transfers assist     Chair/bed transfer assist level: Maximal Assistance - Patient 25 - 49%     Locomotion Ambulation   Ambulation assist              Walk 10 feet activity  Assist           Walk 50 feet activity   Assist           Walk 150 feet activity   Assist           Walk 10 feet on uneven surface  activity   Assist           Wheelchair     Assist               Wheelchair 50 feet with 2 turns activity    Assist            Wheelchair 150 feet activity     Assist            Medical Problem List and Plan: 1.Functional and mobility deficitssecondary to metastatic lung cancer to left clavicle, ribs, L4 vertebral body with fracture. Pt is s/p L4 corpectomy and L3-5 fusion by Dr. Rolena Infante on 06/11/18  CIR on hold, awaiting IVC filter placement  Notes reviewed-lung cancer, labs reviewed 2. DVT Prophylaxis/Anticoagulation: dopplers done this evening and patient has  DVT in left CFV             Bed rest             Awaiting IVC filter placement  Pt not a candidate for anticoagulation due to hematoma and bleeding riskat present although long term he is high VTE risk 3. Pain Management:Continue Fentanyl 12.5 mcg with norco/dilaudid prn. Unable to tolerate Oxycodone. Monitor patient's pain with increased activity. 4. Mood:LCSW to follow for evaluation and support. 5. Neuropsych: This patientiscapable of making decisions on hisown behalf. 6. Skin/Wound Care:Monitor wound for healing. Has been refusing ensure--Added prostat to help promote wound healing. 7. Fluids/Electrolytes/Nutrition:Monitor I/O.  8.Blood pressure; Intermittent elevation noted   Monitor with increased mobility 9. ABLA: Continue to monitor with serial checks.   Hemoglobin 9.3 on 1/8 10. Hypokalemia:   Potassium 3.0 on 1/8, supplement initiated  Magnesium 1.7 on 1/8 11. Rash: Continue Clobetasol bidas well as hypoallergenic sheets. 12. Neuropathic pain: Improving on gabapentin 300 mg tid 13. Constipation:Managed with miralax prn--patient wants to manage his bowel program. 14.  Hypoalbuminemia  Supplement initiated on 1/8   LOS: 1 days A FACE TO FACE EVALUATION WAS PERFORMED  Dandra Velardi [name] 06/30/2018, 9:11 AM

## 2018-06-30 NOTE — Care Management Note (Signed)
[facility] Individual Statement of Services  Patient Name:  Brian [name]  Date:  06/30/2018  Welcome to the [facility].  Our goal is to provide you with an individualized program based on your diagnosis and situation, designed to meet your specific needs.  With this comprehensive rehabilitation program, you will be expected to participate in at least 3 hours of rehabilitation therapies Monday-Friday, with modified therapy programming on the weekends.  Your rehabilitation program will include the following services:  Physical Therapy (PT), Occupational Therapy (OT), 24 hour per day rehabilitation nursing, Neuropsychology, Case Management (Social Worker), Rehabilitation Medicine, Nutrition Services and Pharmacy Services  Weekly team conferences will be held on Wednesday to discuss your progress.  Your Social Worker will talk with you frequently to get your input and to update you on team discussions.  Team conferences with you and your family in attendance may also be held.  Expected length of stay: 15-21 days  Overall anticipated outcome: supervision-min assist level  Depending on your progress and recovery, your program may change. Your Social Worker will coordinate services and will keep you informed of any changes. Your Social Worker's name and contact numbers are listed  below.  The following services may also be recommended but are not provided by the [facility]:    [facility] will be made to provide these services after discharge if needed.  Arrangements include referral to agencies that provide these services.  Your insurance has been verified to be:  [place] Your primary doctor is:  Set designer  Pertinent information will be shared with your doctor and your insurance company.  Social Worker:  Ovidio Kin, [place] or  ([phone]  Information discussed with and copy given to patient by: Elease Hashimoto, 06/30/2018, 1:03 PM

## 2018-06-30 NOTE — Consult Note (Signed)
Chief Complaint: Patient was seen in consultation today for IVC filter placement.   Referring Physician(s): Reesa Chew, PA-C  Supervising Physician: Markus Daft  Patient Status: [facility] - In-pt  History of Present Illness: Brian [name] is a 73 y.o. male with a past medical history significant for RA, emphysema, tobacco abuse, chronic back pain, L4 fracture s/p corpectomy with L3-L5 fusion (06/11/18 & 06/13/18) and recently diagnosed with RLL lung cancer with metastases to the left clavicle, right 8th rib, L4 and liver who currently resides in inpatient rehab. During L4 corpectomy with L3-L5 fusion on 12/20 he had blood loss requiring 4 units PRBCs and 2 units FFP - surgery was stopped due to the bleeding. A CT abdomen/pelvis was performed which showed a small retroperitoneal hematoma which extended to the psoas muscle. He was taken back to the OR on 12/22 to complete previously aborted surgery. He continued to have severe pain and bilateral lower extremity weakness after surgery. MR lumbar spine with and without contrast was performed on 12/27 showed the previously known retroperitoneal hematoma measuring 8.5 x 5.7 cm with expansion into the right psoas muscle. He continued to require blood products and IV iron. CT lumbar spine without contrast was performed on 06/23/18 which showed large right and small left retroperitoneal hematomas expanding into the psoas muscle which was approximately the same size as seen on MRI several days earlier.   He was again taken to the OR on 06/24/18 for L4/5 decompression due to ongoing issues with neuropathy. Bilateral lower extremity US was performed on 06/29/18 showing acute DVT in the left common femoral vein. Due to patient's issues with bleeding and retroperitoneal hematoma he is not currently a candidate for anticoagulation - request has been made to IR for IVC filter placement.   Past Medical History:  Diagnosis Date  . Arthritis     Past Surgical  History:  Procedure Laterality Date  . ANTERIOR CERVICAL CORPECTOMY Left 06/11/2018   Procedure: L4 Corpectomy, L3-5 anterior reconstruction,;  Surgeon: Melina Schools, MD;  Location: [place];  Service: Orthopedics;  Laterality: Left;  6.5 hrs- Ok for this day/time per April  . CATARACT EXTRACTION, BILATERAL    . DECOMPRESSIVE LUMBAR LAMINECTOMY LEVEL 1 N/A 06/24/2018   Procedure: DECOMPRESSIVE LUMBAR 4-5 LAMINECTOMY LEVEL 1;  Surgeon: Melina Schools, MD;  Location: [place];  Service: Orthopedics;  Laterality: N/A;    Allergies: Oxycodone; Minoxidil; Nicotine; and Sulfamethoxazole  Medications: Prior to Admission medications   Medication Sig Start Date End Date Taking? Authorizing Provider  acetaminophen (TYLENOL) 325 MG tablet Take 1-2 tablets (325-650 mg total) by mouth every 4 (four) hours as needed for mild pain. 06/30/18   Love, Ivan Anchors, PA-C  acetaminophen (TYLENOL) 650 MG suppository Place 1 suppository (650 mg total) rectally every 4 (four) hours as needed for mild pain ((score 1 to 3) or temp > 100.5). 06/29/18   Melina Schools, MD  albuterol (PROVENTIL) (2.5 MG/3ML) 0.083% nebulizer solution Take 3 mLs (2.5 mg total) by nebulization every 6 (six) hours as needed for wheezing or shortness of breath. 06/29/18   Melina Schools, MD  bisacodyl (DULCOLAX) 10 MG suppository Place 1 suppository (10 mg total) rectally daily as needed for moderate constipation. 06/29/18   Melina Schools, MD  camphor-menthol [facility]) lotion Apply topically as needed for itching. 06/30/18   Love, Ivan Anchors, PA-C  clobetasol ointment (TEMOVATE) 0.05 % Apply topically 2 (two) times daily. 06/29/18   Melina Schools, MD  feeding supplement, ENSURE ENLIVE, (ENSURE ENLIVE)  LIQD Take 237 mLs by mouth 2 (two) times daily between meals. 06/29/18   Melina Schools, MD  fentaNYL (DURAGESIC - DOSED MCG/HR) 12 MCG/HR Place 1 patch (12.5 mcg total) onto the skin every 3 (three) days. 06/30/18   Melina Schools, MD  gabapentin (NEURONTIN) 300 MG  capsule Take 1 capsule (300 mg total) by mouth 3 (three) times daily. 06/29/18   Melina Schools, MD  guaiFENesin (ROBITUSSIN) 100 MG/5ML SOLN Take 10 mLs (200 mg total) by mouth every 6 (six) hours. 06/29/18   Melina Schools, MD  HYDROcodone-acetaminophen (NORCO) 7.5-325 MG tablet Take 1 tablet by mouth every 8 (eight) hours as needed for severe pain. 06/29/18   Melina Schools, MD  HYDROmorphone HCl (DILAUDID) 1 MG/ML LIQD Take 1 mL (1 mg total) by mouth every 4 (four) hours as needed for moderate pain. 06/29/18   Melina Schools, MD  hydroxypropyl methylcellulose / hypromellose (ISOPTO TEARS / GONIOVISC) 2.5 % ophthalmic solution Place 1-2 drops into both eyes as needed for dry eyes.    [provider]  menthol-cetylpyridinium (CEPACOL) 3 MG lozenge Take 1 lozenge (3 mg total) by mouth as needed for sore throat (sore throat). 06/29/18   Melina Schools, MD  methocarbamol (ROBAXIN) 500 MG tablet Take 1 tablet (500 mg total) by mouth every 8 (eight) hours as needed for muscle spasms. 06/29/18   Melina Schools, MD  pantoprazole (PROTONIX) 40 MG tablet Take 1 tablet (40 mg total) by mouth daily. 06/29/18   Melina Schools, MD  polyethylene glycol [facility] Raveling) packet Take 17 g by mouth daily. 06/30/18   Melina Schools, MD     Family History  Problem Relation Age of Onset  . Diabetes Mother   . Heart attack Father   . Obesity Father   . Diabetes Sister     Social History   Socioeconomic History  . Marital status: Married    Spouse name: Not on file  . Number of children: Not on file  . Years of education: Not on file  . Highest education level: Not on file  Occupational History  . Not on file  Social Needs  . Financial resource strain: Not on file  . Food insecurity:    Worry: Not on file    Inability: Not on file  . Transportation needs:    Medical: Not on file    Non-medical: Not on file  Tobacco Use  . Smoking status: Current Every Day Smoker    Packs/day: 0.25    Types:  Cigarettes  . Smokeless tobacco: Never Used  Substance and Sexual Activity  . Alcohol use: Not on file  . Drug use: Never  . Sexual activity: Not on file  Lifestyle  . Physical activity:    Days per week: Not on file    Minutes per session: Not on file  . Stress: Not on file  Relationships  . Social connections:    Talks on phone: Not on file    Gets together: Not on file    Attends religious service: Not on file    Active member of club or organization: Not on file    Attends meetings of clubs or organizations: Not on file    Relationship status: Not on file  Other Topics Concern  . Not on file  Social History Narrative  . Not on file     Review of Systems: A 12 point ROS discussed and pertinent positives are indicated in the HPI above.  All other systems are  negative.  Review of Systems  Constitutional: Positive for fatigue. Negative for chills and fever.  Respiratory: Negative for cough and shortness of breath.   Cardiovascular: Positive for leg swelling. Negative for chest pain.  Gastrointestinal: Negative for abdominal pain, nausea and vomiting.  Musculoskeletal: Positive for arthralgias, back pain and myalgias.    Vital Signs: BP 131/82 (BP Location: Left Arm)   Pulse 100   Temp (!) 97.5 F (36.4 C) (Oral)   Resp 18   Ht 5\' 10"  (1.778 m)   Wt 213 lb 6.5 oz (96.8 kg)   SpO2 91%   BMI 30.62 kg/m   Physical Exam Vitals signs and nursing note reviewed.  Constitutional:      General: He is not in acute distress.    Comments: Wife and daughter at bedside.  HENT:     Head: Normocephalic and atraumatic.  Cardiovascular:     Rate and Rhythm: Normal rate and regular rhythm.  Pulmonary:     Effort: Pulmonary effort is normal.     Breath sounds: Normal breath sounds.  Abdominal:     General: There is no distension.     Palpations: Abdomen is soft.  Skin:    General: Skin is warm and dry.  Neurological:     Mental Status: He is alert and oriented to person,  place, and time.  Psychiatric:        Mood and Affect: Mood normal.        Behavior: Behavior normal.        Thought Content: Thought content normal.        Judgment: Judgment normal.      MD Evaluation Airway: WNL Heart: WNL Abdomen: WNL Chest/ Lungs: WNL ASA  Classification: 3 Mallampati/Airway Score: One   Imaging: Dg Lumbar Spine 2-3 Views  Result Date: 06/13/2018 CLINICAL DATA:  Corpectomy and fixation of the lower lumbar spine. EXAM: DG C-ARM 61-120 MIN; LUMBAR SPINE - 2-3 VIEW COMPARISON:  CT 06/11/2018. FINDINGS: Posterior rod fixation with pedicle screws in the L3 and L5 vertebral bodies. L4 corpectomy with a spacer device. IMPRESSION: Posterior lumbar fusion and L4 corpectomy. Electronically Signed   By: Suzy Bouchard M.D.   On: 06/13/2018 13:59   Dg Lumbar Spine 2-3 Views  Result Date: 06/11/2018 CLINICAL DATA:  L4 corpectomy EXAM: LUMBAR SPINE - 2-3 VIEW; DG C-ARM 61-120 MIN COMPARISON:  MRI 06/04/2018 FINDINGS: Five low resolution intraoperative spot views of the lumbar spine. Total fluoroscopy time was 3 minutes 19 seconds. Images were obtained during L4 corpectomy and placement of interbody cage. IMPRESSION: Intraoperative fluoroscopic assistance provided during lumbar spine surgery Electronically Signed   By: Donavan Foil M.D.   On: 06/11/2018 19:07   Ct Chest W Contrast  Result Date: 06/04/2018 CLINICAL DATA:  CLINICAL DATA Patient with a pathologic fracture in L4 on postmyelogram CT scan 06/02/2018. Evaluate for primary and metastatic disease. EXAM: CT CHEST, ABDOMEN, AND PELVIS WITH CONTRAST TECHNIQUE: Multidetector CT imaging of the chest, abdomen and pelvis was performed following the standard protocol during bolus administration of intravenous contrast. CONTRAST:  100 mL OMNIPAQUE IOHEXOL 300 MG/ML  SOLN COMPARISON:  Postmyelogram lumbar spine CT scan 06/02/2018. FINDINGS: CT CHEST FINDINGS Cardiovascular: Heart size is normal. There is calcific aortic and  coronary atherosclerosis. No pericardial effusion. Mediastinum/Nodes: Right hilar lymph node on image 37 measures 1.5 cm short axis dimension. Small node anterior to the right mainstem bronchus measuring 1 cm on image 33 also noted. A few smaller mediastinal lymph nodes are  noted. No axillary or supraclavicular lymphadenopathy. Lungs/Pleura: The lungs demonstrate extensive centrilobular emphysematous disease. There is a mass in the right lower lobe which measures 7.3 cm craniocaudal on image 72 of series 7 by 8.1 cm transverse by 5.1 cm AP on image 108 of series 4. Subpleural nodule in the right middle lobe on image 120 measures 0.5 cm Musculoskeletal: A destructive lesion in the medial diaphysis of the left clavicle measures 3 cm long image 20 of series 4 and is approximately 1.4 cm medial to the clavicular head. A destructive lesion in the lateral arc of the right eighth rib measures 7 cm long by 3.1 cm transverse by 4.2 cm craniocaudal. Subtle lucent lesion lateral arc of the left seventh rib on image 105 of series 4 is also noted. No other focal bony abnormality is identified. A subcutaneous low attenuating lesion in the anterior left chest wall measures 1.8 cm AP x 2.6 cm transverse x 2.5 cm craniocaudal. CT ABDOMEN PELVIS FINDINGS Hepatobiliary: A 2.5 cm in diameter hypoattenuating lesion is seen in the left hepatic lobe near the dome on image 54. A punctate hypoattenuating lesion in the right hepatic lobe on image 70 is also identified. The liver is otherwise unremarkable. A few stones are seen in the gallbladder but there is no evidence of cholecystitis. Biliary tree appears normal. Pancreas: Unremarkable. No pancreatic ductal dilatation or surrounding inflammatory changes. Spleen: Normal in size without focal abnormality. Adrenals/Urinary Tract: Mild thickening of the left adrenal gland is most suggestive of hyperplasia. The right adrenal gland is unremarkable. Stomach/Bowel: Stomach is within normal  limits. No evidence of appendicitis. No evidence of bowel wall thickening, distention, or inflammatory changes. Vascular/Lymphatic: The patient has extensive calcific atherosclerosis. Mild aneurysmal dilatation of the descending abdominal aorta at 3.3 cm is identified. No lymphadenopathy. Reproductive: The prostate gland is mildly enlarged. Other: Small fat containing left inguinal hernia is noted. Musculoskeletal: Pathologic fracture of L4 is identified as seen on the comparison CT. No other bony abnormality is identified. Simple lipoma in the right gluteal musculature incidentally noted. IMPRESSION: Right lower lobe mass lesion most consistent with bronchogenic carcinoma. Destructive lesions in the left clavicle, right seventh ribs and L4 vertebral body are consistent with metastatic disease. Small mediastinal lymph nodes measuring up to 1.5 cm are identified may also be pathologic. Two lesions in the liver are identified and worrisome for metastatic disease. Advanced emphysema. Calcific aortic and coronary atherosclerosis. 3.3 cm abdominal aortic aneurysm is identified. Recommend followup by ultrasound in 3 years. This recommendation follows ACR consensus guidelines: White Paper of the ACR Incidental Findings Committee II on Vascular Findings. J Am Coll Radiol 2013; 10:789-794 Gallstones without evidence of cholecystitis. Cystic lesion in the subcutaneous tissues of the anterior left chest wall is likely a sebaceous cyst. Simple lipoma right gluteal musculature. Electronically Signed   By: Inge Rise M.D.   On: 06/04/2018 19:31   Ct Lumbar Spine Wo Contrast  Result Date: 06/23/2018 CLINICAL DATA:  Retroperitoneal hematoma follow-up EXAM: CT LUMBAR SPINE WITHOUT CONTRAST TECHNIQUE: Multidetector CT imaging of the lumbar spine was performed without intravenous contrast administration. Multiplanar CT image reconstructions were also generated. COMPARISON:  Lumbar spine MRI 06/18/2018 FINDINGS: Segmentation:  5 lumbar type vertebrae. Alignment: Normal. Vertebrae: Redemonstration of L3-5 posterior spinal fusion with corpectomy at L4. Normal appearance of the hardware. Paraspinal and other soft tissues: There are large right and small left retroperitoneal hematomas expanding the psoas muscles. The right-sided hematoma measures 19.2 by 10.0 x 6.7 cm. The left-sided  hematoma measures 11.8 x 4.7 by 4.9 cm. Disc levels: No upper spinal canal stenosis. Assessment of the lower lumbar spinal canal is limited by streak artifact from the hardware. IMPRESSION: 1. Large right and small left retroperitoneal hematomas expanding the psoas muscles. Comparison to the MRI of 06/18/2018 is limited given differences in field of view, but sizes are approximately the same. 2. No acute fracture or static subluxation of the lumbar spine. The Electronically Signed   By: Ulyses Jarred M.D.   On: 06/23/2018 05:17   Ct Lumbar Spine W Contrast  Result Date: 06/02/2018 CLINICAL DATA:  Low back and bilateral leg pain. EXAM: LUMBAR MYELOGRAM FLUOROSCOPY TIME:  0 minutes 38 seconds. 142.58 micro gray meter squared PROCEDURE: After thorough discussion of risks and benefits of the procedure including bleeding, infection, injury to nerves, blood vessels, adjacent structures as well as headache and CSF leak, written and oral informed consent was obtained. Consent was obtained by Dr. [name]. Time out form was completed. Patient was positioned prone on the fluoroscopy table. Local anesthesia was provided with 1% lidocaine without epinephrine after prepped and draped in the usual sterile fashion. Puncture was performed at L4-5 using a 3 1/2 inch 22-gauge spinal needle via right paramedian approach. Using a single pass through the dura, the needle was placed within the thecal sac, with return of clear CSF. 15 mL of Isovue M-200 was injected into the thecal sac, with normal opacification of the nerve roots and cauda equina consistent with free flow  within the subarachnoid space. I personally performed the lumbar puncture and administered the intrathecal contrast. I also personally performed acquisition of the myelogram images. TECHNIQUE: Contiguous axial images were obtained through the Lumbar spine after the intrathecal infusion of infusion. Coronal and sagittal reconstructions were obtained of the axial image sets. COMPARISON:  Radiography 04/13/2018 FINDINGS: LUMBAR MYELOGRAM FINDINGS: There is no compressive central canal stenosis. There is an anterior extradural defect at the L4 level with an abnormal appearance of the posterior L4 vertebral body. There appears to be lytic change of the bone, new since the radiography of October. Extradural defect is present with some canal narrowing and lateral recess encroachment right more than left. L5-S1 shows disc space narrowing. Standing flexion extension views show perhaps lighted crease in prominence of the extradural defect at L4. No antero or retrolisthesis is seen. CT LUMBAR MYELOGRAM FINDINGS: No significant abnormality is seen at L2-3 or above. The vertebral bodies appear normal. The disc spaces are unremarkable. No compressive stenosis. At L4, there is a pathologic compression fracture with lytic destruction. There is loss of height posteriorly of 40%. Extraosseous tumor encroaches upon the spinal canal and both of the L4-5 intervertebral foramina, right more than left. Extraosseous tumor extends from the superior endplate of L4 down as far as the L4-5 disc. This is consistent with either metastatic carcinoma or myeloma. At L5-S1, there is chronic disc degeneration with vacuum phenomenon and bulging of the disc. There is bilateral facet degeneration. No central canal stenosis. There is bilateral foraminal narrowing right more than left. As shown at radiography, there is an infrarenal abdominal aortic aneurysm with transverse diameter maximal 3.8 cm. There are calcified gallstones within the gallbladder. No  other regional finding. IMPRESSION: Pathologic fracture at L4 with maximal loss of height posteriorly of 40%. Lytic destruction of the L4 vertebral body. Extraosseous tumor encroaching upon the spinal canal at the L4 level, worse on the right than the left, with some foraminal encroachment worse on the right  than the left at L4-5. This could be metastatic carcinoma or myeloma. Chronic degenerative disc disease at L5-S1 but without significant stenosis. Infrarenal abdominal aortic aneurysm with maximal transverse diameter of 3.8 cm. Recommend followup by ultrasound in 2 years. This recommendation follows ACR consensus guidelines: White Paper of the ACR Incidental Findings Committee II on Vascular Findings. J Am Coll Radiol 2013; 10:789-794. Chololithiasis. These results will be called to the ordering clinician or representative by the Radiologist Assistant, and communication documented in the PACS or zVision Dashboard. Electronically Signed   By: [name] M.D.   On: 06/02/2018 11:59   Mr Jeri Cos GE Contrast  Result Date: 06/07/2018 CLINICAL DATA:  Staging small cell lung cancer. EXAM: MRI HEAD WITHOUT AND WITH CONTRAST TECHNIQUE: Multiplanar, multiecho pulse sequences of the brain and surrounding structures were obtained without and with intravenous contrast. CONTRAST:  9 cc Gadavist. COMPARISON:  None. FINDINGS: Brain: Diffusion imaging does not show any acute or subacute infarction. There are a few old small vessel insults within the pons and cerebellum. Cerebral hemispheres show moderate chronic small-vessel ischemic changes of the deep and subcortical white matter without large vessel territory infarction. No evidence of hemorrhage, hydrocephalus or extra-axial collection. After contrast administration, there is no abnormal enhancement to suggest primary or metastatic mass lesion. Vascular: Major vessels at the base of the brain show flow. Skull and upper cervical spine: Negative Sinuses/Orbits:  Clear/normal Other: None IMPRESSION: No evidence of metastatic disease. Moderate chronic small-vessel ischemic changes affecting the brain as outlined above. Electronically Signed   By: [name] M.D.   On: 06/07/2018 14:45   Mr Lumbar Spine W Wo Contrast  Result Date: 06/18/2018 CLINICAL DATA:  Status post L4 corpectomy for resection of neoplasm. Increased low back pain. EXAM: MRI LUMBAR SPINE WITHOUT AND WITH CONTRAST TECHNIQUE: Multiplanar and multiecho pulse sequences of the lumbar spine were obtained without and with intravenous contrast. CONTRAST:  9 mL Gadavist COMPARISON:  Lumbar spine MRI 06/04/2018 FINDINGS: Segmentation:  Standard. Alignment:  Physiologic. Vertebrae: Status post L4 corpectomy and L3-L5 fusion. Bone marrow signal at the L4 level is largely obscured by susceptibility artifacts from the fusion/corpectomy hardware. Conus medullaris and cauda equina: Conus extends to the level. Conus and cauda equina appear normal. Paraspinal and other soft tissues: The right psoas muscle is markedly expanded at the L4 level, with abnormal internal T1-weighted signal. No associated contrast enhancement. There is edema of the left psoas muscle. Disc levels: Severe spinal canal stenosis at the L4 level is unchanged compared to the prior study. The posterior bulging of the vertebral body is unchanged. There is severe right foraminal stenosis. The other disc levels are unchanged. IMPRESSION: 1. Retroperitoneal hematoma expanding the right psoas muscle measuring up to 8.5 x 5.7 cm. 2. Status post L4 corpectomy and L3-5 fusion with unchanged severe spinal canal stenosis at the L4 level and severe right foraminal narrowing. 3. Magnetic susceptibility effects from the fusion and corpectomy hardware limit assessment of the bone marrow signal at the postoperative levels. These results will be called to the ordering clinician or representative by the Radiologist Assistant, and communication documented in the PACS  or zVision Dashboard. Electronically Signed   By: Ulyses Jarred M.D.   On: 06/18/2018 22:21   Mr Lumbar Spine W Wo Contrast  Result Date: 06/04/2018 CLINICAL DATA:  Metastatic disease.  Lung cancer. EXAM: MRI LUMBAR SPINE WITHOUT AND WITH CONTRAST TECHNIQUE: Multiplanar and multiecho pulse sequences of the lumbar spine were obtained without and with  intravenous contrast. CONTRAST:  7.5 mL Gadavist COMPARISON:  CT lumbar spine 06/02/2018 FINDINGS: Segmentation: Normal. The lowest disc space is considered to be L5-S1. Alignment:  Normal Vertebrae: There is loss of normal signal throughout the L4 vertebral body, where there is a known pathologic fracture with approximately 40% height loss. There is posterior convex bulging with multifocal contrast-enhancement. There is thickening of the overlying ventral dura with associated increased contrast enhancement. This results in severe spinal canal stenosis at this level. There are no other focal osseous lesions. Conus medullaris and cauda equina: The conus medullaris terminates at the L1 level. The cauda equina and conus medullaris are both normal. Paraspinal and other soft tissues: The visualized aorta, IVC and iliac vessels are normal. The visualized retroperitoneal organs and paraspinal soft tissues are normal. Disc levels: Sagittal plane imaging includes the T11-12 disc level through the upper sacrum, with axial imaging of the T12-L1 to L5-S1 disc levels. There is disc desiccation at the T12-L1, L1-L2 and L2-L3 levels without spinal canal stenosis or neural impingement. L3-4: Small disc bulge without associated stenosis. At the L4 level, there is severe spinal canal stenosis due to posterior bulging of the L4 vertebral body. L4-5: Minimal disc bulge. There is severe right neural foraminal stenosis due to the L4 mass. Mild left foraminal stenosis. L5-S1: Disc desiccation and mild endplate spurring. Moderate right neural foraminal stenosis. The visualized portion of  the sacrum is normal. IMPRESSION: 1. Metastatic lesion of the L4 vertebral body with associated pathologic fracture and posterior bulging of the vertebral body. This, in combination with overlying epidural thickening, causes severe spinal canal stenosis and severe right neural foraminal stenosis. 2. Moderate right L5-S1 neural foraminal stenosis. Electronically Signed   By: Ulyses Jarred M.D.   On: 06/04/2018 22:20   Nm Bone Scan Whole Body  Result Date: 06/07/2018 CLINICAL DATA:  Metastatic spine tumor. EXAM: NUCLEAR MEDICINE WHOLE BODY BONE SCAN TECHNIQUE: Whole body anterior and posterior images were obtained approximately 3 hours after intravenous injection of radiopharmaceutical. RADIOPHARMACEUTICALS:  20.2 mCi Technetium-57m MDP IV COMPARISON:  MRI of June 04, 2018. CT scan of June 04, 2018. FINDINGS: Abnormal uptake is seen in left knee consistent with degenerative change. Abnormal uptake is seen at L4 level consistent with metastatic lesion seen on prior MRI. Focus of abnormal uptake is seen involving the posterior portion of a right rib which is consistent with metastatic disease. Abnormal uptake is seen involving the anterior portions of the left first rib and left clavicle consistent with metastatic disease as described on prior CT scan. IMPRESSION: Abnormal uptake is seen in midportion of right rib, L4 vertebral body and anterior portions of left first rib and left clavicle consistent with metastatic disease. This correlates with findings seen on prior CT scan. Electronically Signed   By: Marijo Conception, M.D.   On: 06/07/2018 13:43   Ct Abdomen Pelvis W Contrast  Result Date: 06/04/2018 CLINICAL DATA:  CLINICAL DATA Patient with a pathologic fracture in L4 on postmyelogram CT scan 06/02/2018. Evaluate for primary and metastatic disease. EXAM: CT CHEST, ABDOMEN, AND PELVIS WITH CONTRAST TECHNIQUE: Multidetector CT imaging of the chest, abdomen and pelvis was performed following the  standard protocol during bolus administration of intravenous contrast. CONTRAST:  100 mL OMNIPAQUE IOHEXOL 300 MG/ML  SOLN COMPARISON:  Postmyelogram lumbar spine CT scan 06/02/2018. FINDINGS: CT CHEST FINDINGS Cardiovascular: Heart size is normal. There is calcific aortic and coronary atherosclerosis. No pericardial effusion. Mediastinum/Nodes: Right hilar lymph node on image 37 measures  1.5 cm short axis dimension. Small node anterior to the right mainstem bronchus measuring 1 cm on image 33 also noted. A few smaller mediastinal lymph nodes are noted. No axillary or supraclavicular lymphadenopathy. Lungs/Pleura: The lungs demonstrate extensive centrilobular emphysematous disease. There is a mass in the right lower lobe which measures 7.3 cm craniocaudal on image 72 of series 7 by 8.1 cm transverse by 5.1 cm AP on image 108 of series 4. Subpleural nodule in the right middle lobe on image 120 measures 0.5 cm Musculoskeletal: A destructive lesion in the medial diaphysis of the left clavicle measures 3 cm long image 20 of series 4 and is approximately 1.4 cm medial to the clavicular head. A destructive lesion in the lateral arc of the right eighth rib measures 7 cm long by 3.1 cm transverse by 4.2 cm craniocaudal. Subtle lucent lesion lateral arc of the left seventh rib on image 105 of series 4 is also noted. No other focal bony abnormality is identified. A subcutaneous low attenuating lesion in the anterior left chest wall measures 1.8 cm AP x 2.6 cm transverse x 2.5 cm craniocaudal. CT ABDOMEN PELVIS FINDINGS Hepatobiliary: A 2.5 cm in diameter hypoattenuating lesion is seen in the left hepatic lobe near the dome on image 54. A punctate hypoattenuating lesion in the right hepatic lobe on image 70 is also identified. The liver is otherwise unremarkable. A few stones are seen in the gallbladder but there is no evidence of cholecystitis. Biliary tree appears normal. Pancreas: Unremarkable. No pancreatic ductal  dilatation or surrounding inflammatory changes. Spleen: Normal in size without focal abnormality. Adrenals/Urinary Tract: Mild thickening of the left adrenal gland is most suggestive of hyperplasia. The right adrenal gland is unremarkable. Stomach/Bowel: Stomach is within normal limits. No evidence of appendicitis. No evidence of bowel wall thickening, distention, or inflammatory changes. Vascular/Lymphatic: The patient has extensive calcific atherosclerosis. Mild aneurysmal dilatation of the descending abdominal aorta at 3.3 cm is identified. No lymphadenopathy. Reproductive: The prostate gland is mildly enlarged. Other: Small fat containing left inguinal hernia is noted. Musculoskeletal: Pathologic fracture of L4 is identified as seen on the comparison CT. No other bony abnormality is identified. Simple lipoma in the right gluteal musculature incidentally noted. IMPRESSION: Right lower lobe mass lesion most consistent with bronchogenic carcinoma. Destructive lesions in the left clavicle, right seventh ribs and L4 vertebral body are consistent with metastatic disease. Small mediastinal lymph nodes measuring up to 1.5 cm are identified may also be pathologic. Two lesions in the liver are identified and worrisome for metastatic disease. Advanced emphysema. Calcific aortic and coronary atherosclerosis. 3.3 cm abdominal aortic aneurysm is identified. Recommend followup by ultrasound in 3 years. This recommendation follows ACR consensus guidelines: White Paper of the ACR Incidental Findings Committee II on Vascular Findings. J Am Coll Radiol 2013; 10:789-794 Gallstones without evidence of cholecystitis. Cystic lesion in the subcutaneous tissues of the anterior left chest wall is likely a sebaceous cyst. Simple lipoma right gluteal musculature. Electronically Signed   By: Inge Rise M.D.   On: 06/04/2018 19:31   Dg Lumbar Spine 1 View  Result Date: 06/24/2018 CLINICAL DATA:  Decompressive laminectomy L4-5 EXAM:  LUMBAR SPINE - 1 VIEW COMPARISON:  None. FINDINGS: Changes of corpectomy at L4 and posterior fusion from L3-L5. Posterior needles are directed at the L4 corpectomy level. IMPRESSION: Intraoperative localization as above. Electronically Signed   By: Rolm Baptise M.D.   On: 06/24/2018 12:06   Dg Chest Port 1 View  Result Date: 06/13/2018  CLINICAL DATA:  ETT, Respiratory failure, non smoker. EXAM: PORTABLE CHEST 1 VIEW COMPARISON:  None. FINDINGS: Endotracheal tube and NG tube in good position. Linear density in the LEFT lower lobe unchanged. Persistent low lung volumes. Upper lungs clear. IMPRESSION: 1. No interval change.  Stable support apparatus. 2. Atelectasis in the LEFT lower lobe. Electronically Signed   By: Suzy Bouchard M.D.   On: 06/13/2018 07:32   Dg Chest Port 1 View  Result Date: 06/12/2018 CLINICAL DATA:  Right lower lobe lung mass. EXAM: PORTABLE CHEST 1 VIEW COMPARISON:  06/11/2018 FINDINGS: 1340 hours. Endotracheal tube tip is 7.2 cm above the base of the carina. The NG tube passes into the stomach although the distal tip position is not included on the film. Interval increase in left base atelectasis. Right lower lobe pulmonary mass again noted. Interstitial markings are diffusely coarsened with chronic features. Probable tiny pleural effusions. No worrisome lytic or sclerotic osseous abnormality. Telemetry leads overlie the chest. IMPRESSION: New left lower lobe atelectasis with potential small bilateral effusions. Right lower lobe pulmonary mass. Chronic underlying interstitial lung disease. Electronically Signed   By: Misty Stanley M.D.   On: 06/12/2018 14:18   Dg Chest Port 1 View  Result Date: 06/11/2018 CLINICAL DATA:  Intubated. EXAM: PORTABLE CHEST 1 VIEW COMPARISON:  Chest CT 06/04/2018. FINDINGS: 2036 hours. Endotracheal tube tip is 8.2 cm above the base of the carina. Heart size upper normal. Interstitial markings are diffusely coarsened with chronic features. Known right  lower lobe mass projects over the right hilum. There is right base atelectasis or infiltrate. IMPRESSION: 1. Endotracheal tube tip is 8.2 cm above the base the carina. 2. Known right lower lobe mass projects over the right hilum. 3. Chronic interstitial lung disease. Atelectasis or infiltrate noted at the right base. Electronically Signed   By: Misty Stanley M.D.   On: 06/11/2018 20:50   Dg Spine Portable 1 View  Result Date: 06/24/2018 CLINICAL DATA:  Tumor of L4 vertebral body. EXAM: PORTABLE SPINE - 1 VIEW COMPARISON:  MRI dated 06/18/2018 and CT scan dated 06/23/2018 FINDINGS: Lateral view of the lumbar spine demonstrates an instrument under the lamina of L4. Previous L4 partial corpectomy and posterior fusion from L3-L5. IMPRESSION: Instrument under the lamina of L4. Electronically Signed   By: Lorriane Shire M.D.   On: 06/24/2018 11:20   Dg Abd Portable 1v  Result Date: 06/11/2018 CLINICAL DATA:  Orogastric tube placement. EXAM: PORTABLE ABDOMEN - 1 VIEW COMPARISON:  CT of earlier today FINDINGS: Nasogastric terminates at the body of the stomach. Bibasilar airspace disease, likely atelectasis. No gross free intraperitoneal air or bowel obstruction identified. IMPRESSION: Nasogastric terminating at the body of the stomach. Electronically Signed   By: Abigail Miyamoto M.D.   On: 06/11/2018 23:36   Dg C-arm 1-60 Min  Result Date: 06/13/2018 CLINICAL DATA:  Corpectomy and fixation of the lower lumbar spine. EXAM: DG C-ARM 61-120 MIN; LUMBAR SPINE - 2-3 VIEW COMPARISON:  CT 06/11/2018. FINDINGS: Posterior rod fixation with pedicle screws in the L3 and L5 vertebral bodies. L4 corpectomy with a spacer device. IMPRESSION: Posterior lumbar fusion and L4 corpectomy. Electronically Signed   By: Suzy Bouchard M.D.   On: 06/13/2018 13:59   Dg C-arm 1-60 Min  Result Date: 06/11/2018 CLINICAL DATA:  L4 corpectomy EXAM: LUMBAR SPINE - 2-3 VIEW; DG C-ARM 61-120 MIN COMPARISON:  MRI 06/04/2018 FINDINGS: Five  low resolution intraoperative spot views of the lumbar spine. Total fluoroscopy time was 3 minutes  19 seconds. Images were obtained during L4 corpectomy and placement of interbody cage. IMPRESSION: Intraoperative fluoroscopic assistance provided during lumbar spine surgery Electronically Signed   By: Donavan Foil M.D.   On: 06/11/2018 19:07   Dg Myelography Lumbar Inj Lumbosacral  Result Date: 06/02/2018 CLINICAL DATA:  Low back and bilateral leg pain. EXAM: LUMBAR MYELOGRAM FLUOROSCOPY TIME:  0 minutes 38 seconds. 142.58 micro gray meter squared PROCEDURE: After thorough discussion of risks and benefits of the procedure including bleeding, infection, injury to nerves, blood vessels, adjacent structures as well as headache and CSF leak, written and oral informed consent was obtained. Consent was obtained by Dr. [name]. Time out form was completed. Patient was positioned prone on the fluoroscopy table. Local anesthesia was provided with 1% lidocaine without epinephrine after prepped and draped in the usual sterile fashion. Puncture was performed at L4-5 using a 3 1/2 inch 22-gauge spinal needle via right paramedian approach. Using a single pass through the dura, the needle was placed within the thecal sac, with return of clear CSF. 15 mL of Isovue M-200 was injected into the thecal sac, with normal opacification of the nerve roots and cauda equina consistent with free flow within the subarachnoid space. I personally performed the lumbar puncture and administered the intrathecal contrast. I also personally performed acquisition of the myelogram images. TECHNIQUE: Contiguous axial images were obtained through the Lumbar spine after the intrathecal infusion of infusion. Coronal and sagittal reconstructions were obtained of the axial image sets. COMPARISON:  Radiography 04/13/2018 FINDINGS: LUMBAR MYELOGRAM FINDINGS: There is no compressive central canal stenosis. There is an anterior extradural defect at the  L4 level with an abnormal appearance of the posterior L4 vertebral body. There appears to be lytic change of the bone, new since the radiography of October. Extradural defect is present with some canal narrowing and lateral recess encroachment right more than left. L5-S1 shows disc space narrowing. Standing flexion extension views show perhaps lighted crease in prominence of the extradural defect at L4. No antero or retrolisthesis is seen. CT LUMBAR MYELOGRAM FINDINGS: No significant abnormality is seen at L2-3 or above. The vertebral bodies appear normal. The disc spaces are unremarkable. No compressive stenosis. At L4, there is a pathologic compression fracture with lytic destruction. There is loss of height posteriorly of 40%. Extraosseous tumor encroaches upon the spinal canal and both of the L4-5 intervertebral foramina, right more than left. Extraosseous tumor extends from the superior endplate of L4 down as far as the L4-5 disc. This is consistent with either metastatic carcinoma or myeloma. At L5-S1, there is chronic disc degeneration with vacuum phenomenon and bulging of the disc. There is bilateral facet degeneration. No central canal stenosis. There is bilateral foraminal narrowing right more than left. As shown at radiography, there is an infrarenal abdominal aortic aneurysm with transverse diameter maximal 3.8 cm. There are calcified gallstones within the gallbladder. No other regional finding. IMPRESSION: Pathologic fracture at L4 with maximal loss of height posteriorly of 40%. Lytic destruction of the L4 vertebral body. Extraosseous tumor encroaching upon the spinal canal at the L4 level, worse on the right than the left, with some foraminal encroachment worse on the right than the left at L4-5. This could be metastatic carcinoma or myeloma. Chronic degenerative disc disease at L5-S1 but without significant stenosis. Infrarenal abdominal aortic aneurysm with maximal transverse diameter of 3.8 cm.  Recommend followup by ultrasound in 2 years. This recommendation follows ACR consensus guidelines: White Paper of the ACR Incidental Findings  Committee II on Vascular Findings. J Am Coll Radiol 2013; 10:789-794. Chololithiasis. These results will be called to the ordering clinician or representative by the Radiologist Assistant, and communication documented in the PACS or zVision Dashboard. Electronically Signed   By: [name] M.D.   On: 06/02/2018 11:59   Vas [place] Lower Extremity Venous (dvt)  Result Date: 06/29/2018  Lower Venous Study Indications: Immobility.  Performing Technologist: Oliver Hum RVT  Examination Guidelines: A complete evaluation includes B-mode imaging, spectral Doppler, color Doppler, and power Doppler as needed of all accessible portions of each vessel. Bilateral testing is considered an integral part of a complete examination. Limited examinations for reoccurring indications may be performed as noted.  Right Venous Findings: +---------+---------------+---------+-----------+----------+-------+          CompressibilityPhasicitySpontaneityPropertiesSummary +---------+---------------+---------+-----------+----------+-------+ CFV      Full           Yes      Yes                          +---------+---------------+---------+-----------+----------+-------+ SFJ      Full                                                 +---------+---------------+---------+-----------+----------+-------+ FV Prox  Full                                                 +---------+---------------+---------+-----------+----------+-------+ FV Mid   Full                                                 +---------+---------------+---------+-----------+----------+-------+ FV DistalFull                                                 +---------+---------------+---------+-----------+----------+-------+ PFV      Full                                                  +---------+---------------+---------+-----------+----------+-------+ POP      Full           Yes      Yes                          +---------+---------------+---------+-----------+----------+-------+ PTV      Full                                                 +---------+---------------+---------+-----------+----------+-------+ PERO     Full                                                 +---------+---------------+---------+-----------+----------+-------+  Left Venous Findings: +---------+---------------+---------+-----------+----------+--------------+          CompressibilityPhasicitySpontaneityPropertiesSummary        +---------+---------------+---------+-----------+----------+--------------+ CFV      Partial        Yes      Yes                  Acute          +---------+---------------+---------+-----------+----------+--------------+ SFJ      Full                                                        +---------+---------------+---------+-----------+----------+--------------+ FV Prox  Full                                                        +---------+---------------+---------+-----------+----------+--------------+ FV Mid   Full                                                        +---------+---------------+---------+-----------+----------+--------------+ FV DistalFull                                                        +---------+---------------+---------+-----------+----------+--------------+ PFV      Full                                                        +---------+---------------+---------+-----------+----------+--------------+ POP      Full           Yes      Yes                                 +---------+---------------+---------+-----------+----------+--------------+ PTV      Full                                                        +---------+---------------+---------+-----------+----------+--------------+  PERO     Full                                                        +---------+---------------+---------+-----------+----------+--------------+ EIV                     Yes      Yes                                 +---------+---------------+---------+-----------+----------+--------------+  CIV                                                   Not visualized +---------+---------------+---------+-----------+----------+--------------+    Summary: Right: There is no evidence of deep vein thrombosis in the lower extremity. No cystic structure found in the popliteal fossa. Left: Findings consistent with acute deep vein thrombosis involving the left common femoral vein. No cystic structure found in the popliteal fossa. Unable to visualize the common iliac vein due to bowel gas.  *See table(s) above for measurements and observations. Electronically signed by Sherren Mocha MD on 06/29/2018 at 8:05:33 PM.    Final    Ct Angio Abd/pel W/ And/or W/o  Result Date: 06/11/2018 CLINICAL DATA:  73 year old male with concern for postoperative bleeding during corpectomy L4. History of known metastatic disease, bronchogenic carcinoma EXAM: CT ANGIOGRAPHY ABDOMEN AND PELVIS WITH CONTRAST AND WITHOUT CONTRAST TECHNIQUE: Multidetector CT imaging of the abdomen and pelvis was performed using the standard protocol during bolus administration of intravenous contrast. Multiplanar reconstructed images and MIPs were obtained and reviewed to evaluate the vascular anatomy. CONTRAST:  175mL ISOVUE-370 IOPAMIDOL (ISOVUE-370) INJECTION 76% COMPARISON:  CT 06/04/2018, 06/02/2018 FINDINGS: VASCULAR Aorta: Mild atherosclerotic changes of the abdominal aorta. Infrarenal abdominal aortic aneurysm with the largest diameter measuring 3.5 cm near the IMA origin. No periaortic fluid. No dissection. Median sacral artery is of small caliber anterior to the sacrum, does not appear to contribute to hematoma of the retroperitoneum.  Separate origin of bilateral L5 lumbar arteries. Contrast staining of the right paravertebral soft tissues/retroperitoneum/psoas muscle just lateral to the corpectomy site. Hematoma at this site measures 22 mm in AP diameter just lateral to the corpectomy hardware in the region of the tumor resection/treatment. Contrast staining within the psoas musculature, with the largest focus 10 mm. Celiac: Celiac artery patent with no significant atherosclerotic changes at the origin. Branches are patent. Replaced left hepatic artery. SMA: SMA patent with mild atherosclerotic changes at the origin, no significant stenosis. Branches are patent. Renals: Single right renal artery with no significant atherosclerotic changes at the origin. The main left renal artery originates from the 3 o'clock position with minimal atherosclerotic changes. There is an accessory left renal artery from the 1 o'clock position to the superior pole. IMA: IMA patent Right lower extremity: Tortuous right iliac system. Mild atherosclerotic changes of the iliac system. Hypogastric artery is patent. External iliac artery patent. Common femoral artery patent with mild atherosclerotic changes. Proximal SFA and profunda femoris patent. Left lower extremity: Tortuous left iliac system. Mild atherosclerotic changes. Hypogastric artery is patent. External iliac artery patent. Mild atherosclerotic changes of the common femoral artery. Profunda femoris and SFA patent. Veins: Unremarkable appearance of the venous system. Review of the MIP images confirms the above findings. NON-VASCULAR Lower chest: Redemonstration of known right lower lobe mass with associated atelectasis/consolidation, incompletely imaged. Atelectasis at the dependent aspects of the left lower lobe. Redemonstration of right chest wall metastatic lesion centered within the angle of the right eighth rib. Greatest diameter on axial images measures 2.9 cm x 7.0 cm, relatively unchanged from the  comparison CT. Hepatobiliary: Redemonstration of segment 2 metastatic lesion with heterogeneous enhancement measures 3.2 cm, slightly larger than the comparison CT, though potentially measurement difference secondary to technique. Questionable small lesion in the lateral right liver measures 4 mm-5 mm. Heterogeneous enhancement  in the inferior aspects of the right liver of uncertain significance. Calcified stones within the gallbladder. Pancreas: Unremarkable pancreas Spleen: Unremarkable spleen Adrenals/Urinary Tract: Unremarkable adrenal glands Right: No hydronephrosis. Symmetric perfusion to the left. No nephrolithiasis. Unremarkable course of the right ureter. Left: No hydronephrosis. Symmetric perfusion to the right. No nephrolithiasis. Unremarkable course of the left ureter. Balloon retention catheter in the urinary bladder. Air-fluid level within the urinary bladder, likely secondary to recent instrumentation. Stomach/Bowel: Distal esophagus is circumferentially thickened with hyperenhancement of the mucosal surface. Unremarkable stomach. Small bowel decompressed. No transition point or evidence of obstruction. Normal appendix colonic diverticula without evidence of acute inflammatory changes. Lymphatic: No lymphadenopathy Mesenteric: Postsurgical changes of the left abdominal wall with surgical drain from the surgical site of L4 traversing the left psoas and the left retroperitoneum and left abdominal wall. Minimal gas within the right psoas and left psoas as well as the left retroperitoneum. Reproductive: Transverse prostate measures 5.4 cm Other: None Musculoskeletal: Postsurgical changes of L4 corpectomy with hardware placement centered at the vertebral body site. Metallic streak artifact somewhat obscures details at this site. Relatively unchanged appearance of soft tissue posterior to the surgical site at the anterior aspect of the canal, seen on prior myelogram to narrow the canal with. The hematoma on  the right aspect of the vertebral body appears lateral and anterior to the canal. IMPRESSION: Interval postsurgical changes of L4 corpectomy with cage placement, and surgical drain from the left surgical site through the left retroperitoneum. Small hematoma in the retroperitoneum at the right lateral aspect of the spine extending into the psoas muscle, and in the setting of recent surgery could be venous or arterial. Similar appearance of soft tissue at the anterior aspect of the spinal canal at the surgical site, partially obscured by streak artifact on the current CT, and better characterized on the prior. Redemonstration of right right lower lobe lung mass and metastatic disease involving liver, right eighth rib/chest wall. Heterogeneous appearance of the right liver lobe, segment 6, on the venous phase could represent additional small metastatic lesions versus heterogeneous attenuation/perfusion. Circumferential esophageal thickening in the lower thoracic esophagus. Recommend correlation with history of GERD/esophagitis. Infrarenal abdominal aortic aneurysm measuring 3.5 cm. Aortic aneurysm NOS (ICD10-I71.9). Aortic Atherosclerosis (ICD10-I70.0). Electronically Signed   By: Corrie Mckusick D.O.   On: 06/11/2018 21:59    Labs:  CBC: Recent Labs    06/17/18 0345 06/19/18 0448 06/21/18 0759 06/21/18 1920 06/25/18 0355 06/30/18 0619  WBC 11.3* 13.5*  --   --  10.6* 10.3  HGB 7.7* 7.7* 7.0* 9.2* 8.0* 9.3*  HCT 24.6* 23.3* 21.8* 28.2* 26.0* 29.6*  PLT 284 358  --   --  283 373    COAGS: Recent Labs    06/04/18 1417 06/12/18 0456 06/14/18 0538  INR 0.99 1.16 1.12  APTT 30 26 29     BMP: Recent Labs    06/25/18 0355 06/26/18 1034 06/27/18 0610 06/30/18 0619  NA 138 137 135 135  K 2.8* 3.3* 3.7 3.0*  CL 106 108 104 106  CO2 24 23 24 22   GLUCOSE 147* 129* 105* 115*  BUN 7* 6* 5* 7*  CALCIUM 6.6* 6.9* 7.1* 7.2*  CREATININE 0.71 0.68 0.63 0.65  GFRNONAA >60 >60 >60 >60  GFRAA >60  >60 >60 >60    LIVER FUNCTION TESTS: Recent Labs    06/04/18 1417 06/05/18 0634 06/30/18 0619  BILITOT 1.2 0.9 1.3*  AST 23 19 32  ALT 20 19 30   ALKPHOS  99 81 85  PROT 6.8 5.9* 5.3*  ALBUMIN 3.4* 2.8* 1.8*    TUMOR MARKERS: No results for input(s): AFPTM, CEA, CA199, CHROMGRNA in the last 8760 hours.  Assessment and Plan:  Patient with newly diagnosed RLL lung cancer with liver and bony metastases who is s/p L4 corpectomy with L3/L5 fusion due to pathologic fracture of L4 - he initially was taken to the OR on 12/20, however the procedure was stopped due to significant blood loss. CT abdomen at that time showed retroperitoneal hematoma extending into the right psoas muscle. He was taken back to the OR on 12/22 to complete procedure, however he continued to experience severe pain and weakness in his lower extremities. MR lumbar spine showed enlargement of the retroperitoneal hematoma with continued expansion into the right psoas muscle. He was again taken to the OR on 1/2 for L4/5 decompression due to neuropathy. Bilateral lower extremity US was performed 1/7 which showed acute left DVT. Patient not currently a candidate for anticoagulation - request for IVC filter placement. Patient reviewed by Dr. [name] who agrees to proceed with filter placement this afternoon.  Last PO intake 0830 today, afebrile, WBC 10.3, hgb 9.3, plt 373, creatinine 0.65.  Risks and benefits discussed with the patient, his wife and his daughter including, but not limited to bleeding, infection, contrast induced renal failure, filter fracture or migration which can lead to emergency surgery or even death, strut penetration with damage or irritation to adjacent structures and caval thrombosis.  All of the patient's family's questions were answered, patient and his family are agreeable to proceed.  Consent signed and in chart.  Thank you for this interesting consult.  I greatly enjoyed meeting Brian [name] and  look forward to participating in their care.  A copy of this report was sent to the requesting provider on this date.  Electronically Signed: Joaquim Nam, PA-C 06/30/2018, 11:29 AM   I spent a total of 40 Minutes in face to face in clinical consultation, greater than 50% of which was counseling/coordinating care for IVC filter placement.

## 2018-06-30 NOTE — Patient Care Conference (Signed)
Inpatient RehabilitationTeam Conference and Plan of Care Update Date: 06/30/2018   Time: 2:20 PM    Patient Name: Brian [name]      Medical Record Number: [id number]  Date of Birth: [birth year] Sex: Male         Room/Bed: 4M10C/4M10C-01 Payor Info: Payor: MEDICARE / Plan: MEDICARE PART A AND B / Product Type: *No Product type* /    Admitting Diagnosis: metastatic lung cancer  Admit Date/Time:  06/29/2018  2:39 PM Admission Comments: No comment available   Primary Diagnosis:  <principal problem not specified> Principal Problem: <principal problem not specified>  Patient Active Problem List   Diagnosis Date Noted  . DVT of lower extremity (deep venous thrombosis) ([place])   . Malignant neoplasm of lung ([place])   . Hypoalbuminemia due to protein-calorie malnutrition ([place])   . Acute blood loss anemia   . Postoperative pain   . Pathological compression fracture of lumbar vertebra ([place]) 06/29/2018  . Metastatic lung cancer (metastasis from lung to other site) ([place]) 06/28/2018  . Rash 06/25/2018  . Hypokalemia 06/25/2018  . Normocytic anemia 06/25/2018  . S/P lumbar fusion 06/24/2018  . Palliative care by specialist   . Cancer related pain   . Lung cancer, primary, with metastasis from lung to other site, right ([place]) 06/14/2018  . Squamous cell lung cancer, right ([place]) 06/14/2018  . Lung cancer metastatic to bone ([place]) 06/14/2018  . Bronchitis due to tobacco use 06/07/2018  . Lumbar spine tumor 06/04/2018    Expected Discharge Date:    Team Members Present: Physician leading conference: Dr. Delice Lesch Social Worker Present: Ovidio Kin, LCSW Nurse Present: Leonette Nutting, RN PT Present: Leavy Cella, PT OT Present: Amy Rounds, OT SLP Present: Windell Moulding, SLP PPS Coordinator present : Gunnar Fusi     Current Status/Progress Goal Weekly Team Focus  Medical   Functional and mobility deficits secondary to metastatic lung cancer to left clavicle, ribs, L4 vertebral body with  fracture. Pt is s/p L4 corpectomy and L3-5 fusion by Dr. Rolena Infante on 06/11/18  Improve mobility, pain, DVT, potassium  See above   Bowel/Bladder   Pt is continent/incontinent of B/B. LBM 06/29/2018.  Maintain regular bowel pattern. Promote continence via timed toileting.  Assist with toileting needs PRN.   Swallow/Nutrition/ Hydration             ADL's   On bed rest, evals derred until tomorrow         Mobility     eval pending-deferred until tomorrow        Communication             Safety/Cognition/ Behavioral Observations            Pain   Pt complains of pain of 8 to lower back. Pain managed with Dilaudid.  Pain < 4  Assess pai Q shift and PRN. Medicate according to orders.   Skin   Pt has rash to back, flank, and leg. Rash treated with Temovate.  Maintain skin integrity and prevent skin breakdown.  Assess skin Q shift and PRN.       *See Care Plan and progress notes for long and short-term goals.     Barriers to Discharge  Current Status/Progress Possible Resolutions Date Resolved   Physician    Medical stability;Other (comments)  DVT  See above  Therapies, IVC filter, optimize pain meds, supplement K+      Nursing  PT                    OT                  SLP                SW                Discharge Planning/Teaching Needs:    Home with wife who is able to assist and was prior to admission. She stays here with him.     Team Discussion:  Unable to set goals due to on bed rest due to surgery for IVC filter for DVT. Will be evaluated tomorrow and goals set  Revisions to Treatment Plan:  Eval deferred until tomorrow when medically stable    Continued Need for Acute Rehabilitation Level of Care: The patient requires daily medical management by a physician with specialized training in physical medicine and rehabilitation for the following conditions: Daily direction of a multidisciplinary physical rehabilitation program to ensure safe treatment  while eliciting the highest outcome that is of practical value to the patient.: Yes Daily medical management of patient stability for increased activity during participation in an intensive rehabilitation regime.: Yes Daily analysis of laboratory values and/or radiology reports with any subsequent need for medication adjustment of medical intervention for : Post surgical problems;Other   I attest that I was present, lead the team conference, and concur with the assessment and plan of the team.   Elease Hashimoto 06/30/2018, 2:46 PM

## 2018-06-30 NOTE — Progress Notes (Signed)
Social Work  Social Work Assessment and Plan  Patient Details  Name: Brian [name] MRN: [id number] Date of Birth: 02-25-1946  Today's Date: 06/30/2018  Problem List:  Patient Active Problem List   Diagnosis Date Noted  . DVT of lower extremity (deep venous thrombosis) ([place])   . Malignant neoplasm of lung ([place])   . Hypoalbuminemia due to protein-calorie malnutrition ([place])   . Acute blood loss anemia   . Postoperative pain   . Pathological compression fracture of lumbar vertebra ([place]) 06/29/2018  . Metastatic lung cancer (metastasis from lung to other site) ([place]) 06/28/2018  . Rash 06/25/2018  . Hypokalemia 06/25/2018  . Normocytic anemia 06/25/2018  . S/P lumbar fusion 06/24/2018  . Palliative care by specialist   . Cancer related pain   . Lung cancer, primary, with metastasis from lung to other site, right ([place]) 06/14/2018  . Squamous cell lung cancer, right ([place]) 06/14/2018  . Lung cancer metastatic to bone ([place]) 06/14/2018  . Bronchitis due to tobacco use 06/07/2018  . Lumbar spine tumor 06/04/2018   Past Medical History:  Past Medical History:  Diagnosis Date  . Arthritis    Past Surgical History:  Past Surgical History:  Procedure Laterality Date  . ANTERIOR CERVICAL CORPECTOMY Left 06/11/2018   Procedure: L4 Corpectomy, L3-5 anterior reconstruction,;  Surgeon: Melina Schools, MD;  Location: [facility];  Service: Orthopedics;  Laterality: Left;  6.5 hrs- Ok for this day/time per April  . CATARACT EXTRACTION, BILATERAL    . DECOMPRESSIVE LUMBAR LAMINECTOMY LEVEL 1 N/A 06/24/2018   Procedure: DECOMPRESSIVE LUMBAR 4-5 LAMINECTOMY LEVEL 1;  Surgeon: Melina Schools, MD;  Location: [facility];  Service: Orthopedics;  Laterality: N/A;   Social History:  reports that he has been smoking cigarettes. He has been smoking about 0.25 packs per day. He has never used smokeless tobacco. He reports that he does not use drugs. No history on file for alcohol.  Family / Support  Systems Marital Status: Married Patient Roles: Spouse, Parent Spouse/Significant Other: Brian [name] [phone] Supports: Friends and church members Anticipated Caregiver: Wife and friends to cehck on  both of them Ability/Limitations of Caregiver: Wife is in good health and can assist husband Caregiver Availability: 24/7 Family Dynamics: Pt and wife are very close and will do for one another-they always have. They have church members and friends who are supportive and involved with couple.   Social History Preferred language: English Religion:  Cultural Background: No issues Education: Research scientist (physical sciences) Read: Yes Write: Yes Employment Status: Retired Public relations account executive Issues: No issues Guardian/Conservator: None-according to MD pt is capable of making his own decisions , wife is here daily and will be included in any decision due to pt's wishes.   Abuse/Neglect Abuse/Neglect Assessment Can Be Completed: Yes Physical Abuse: Denies Verbal Abuse: Denies Sexual Abuse: Denies Exploitation of patient/patient's resources: Denies Self-Neglect: Denies  Emotional Status Pt's affect, behavior and adjustment status: Pt is motivated to keep moving forward and heal so he can begin the radiation treatments. His wife is very supportive and will work along beside him. Both are very supportive of one another and will do for each other. Pt hopes to be able to do as much as he can for himself he does not like relying on others or asking for assist. Recent Psychosocial Issues: had health issues but has recently found out  he has cancer and will need further treatment once his back is healed Psychiatric History: No history due to recent findings and  cancer diagnosis he would benefit from seeing neruo-psych while here. He is agreeable to this when discussed, wif efeels would be beneficial also. Substance Abuse History: Tobacco aware needs to quit and has now hospitalized  Patient / Family  Perceptions, Expectations & Goals Pt/Family understanding of illness & functional limitations: Pt and wife have a good understanding of his back surgeries and cancer diagnosis. They have talked with all of the MD's involved and have asked their questions and feel they have a good understanding of his treatment plan going forward.  Premorbid pt/family roles/activities: husband, friend, retiree, church member, neighbor, Social research officer, government Anticipated changes in roles/activities/participation: resume Pt/family expectations/goals: Pt states: " I want to keep moving to get to the next level of treatment and to do for myself I don't like asking for help." Wife states: " I just want to keep moving forward and seeing him progress."  [employer]: None Premorbid Home Care/DME Agencies: Other (Comment)(cane, rw, tub seat from recent back issues) Transportation available at discharge: Wife pt stopped drivng a few months ago due to his back pain Resource referrals recommended: Neuropsychology, Support group (specify)  Discharge Planning Living Arrangements: Spouse/significant other Support Systems: Spouse/significant other, Friends/neighbors, Social worker community Type of Residence: Private residence Insurance Resources: [employer], Multimedia programmer (specify)(BCBS) Financial Resources: Radio broadcast assistant Screen Referred: No Living Expenses: Own Money Management: Spouse, Patient Does the patient have any problems obtaining your medications?: No Home Management: Wife has been doing since pt back pain foe the past few months Patient/Family Preliminary Plans: Return home with wife who can assist with his care. She has been assisting with his ADL's the past few months due to back pain and his decline in function. She plans to be here daily to assist and see his progress in therapies. Aware bed rest today due to getting a IVC filter for his blood clot. Will start his rehab tomorrow. Social  Work Anticipated Follow Up Needs: HH/OP, Support Group  Clinical Impression Pleasant gentleman who has been through a great deal in the past few months. He has had tow major back surgeries and has been diagnosed with stage 3 cancer. Once he is healed he will need to get radiation therapy. His wife is very involved and supportive and willing to assist him at discharge. Pt would benefit from seeing neuro-psych while here for coping due to all of the recent events. Therapy team to evaluate him tomorrow once IVC filter placed today and will work on discharge plans.  Elease Hashimoto 06/30/2018, 1:29 PM

## 2018-06-30 NOTE — Progress Notes (Signed)
Per nursing, patient was given "Data Collection Information Summary for Patients in Inpatient Rehabilitation Facilities with attached Privacy Act Statement Health Care Records" upon admission.    Patient information reviewed and entered into [facility] by Becky Andalyn Heckstall, PPS coordinator. Information including medical coding, function ability, and quality indicators will be reviewed and updated through discharge.   

## 2018-06-30 NOTE — Procedures (Signed)
Interventional Radiology Procedure:   Indications: Left CFV DVT and not anticoagulation candidate  Procedure: IVC filter placement  Findings: Patent IVC with narrowing in distal IVC, likely from adjacent AAA.  IVC filter placed below renal veins.  Complications: None     EBL: Minimal  Plan: Bedrest 2 hours.    Brian [name] R. [name], MD  Pager: [phone]

## 2018-06-30 NOTE — Progress Notes (Signed)
Patient's daughter comes to the nurse's station around 0315 to ask for pain medication for patient. The RN states that she will inform the patient's assigned nurse that pain medication has been requested. Upon re-assessment by assigned RN at 0335, patient is found to be A&Ox4 and able to verbalize that he is not in pain. However, patient states that, "I am having spasms in both legs and would like some Robaxin." Patient was given Robaxin by the RN at [phone]. RN educates daughter on the importance of rest and healing as well as pain medication administration. Patient is resting and asymptomatic. RN will continue to monitor. RN will continue to provide education concerning administration of pain medication to family.

## 2018-07-01 ENCOUNTER — Inpatient Hospital Stay (HOSPITAL_COMMUNITY): Payer: Medicare Other

## 2018-07-01 ENCOUNTER — Inpatient Hospital Stay (HOSPITAL_COMMUNITY): Payer: Medicare Other | Admitting: Occupational Therapy

## 2018-07-01 DIAGNOSIS — G893 Neoplasm related pain (acute) (chronic): Secondary | ICD-10-CM

## 2018-07-01 DIAGNOSIS — I824Y9 Acute embolism and thrombosis of unspecified deep veins of unspecified proximal lower extremity: Secondary | ICD-10-CM

## 2018-07-01 NOTE — Progress Notes (Addendum)
Physical Therapy Session Note  Patient Details  Name: Brian [name] MRN: [id number] Date of Birth: Jan 14, 1946  Today's Date: 07/01/2018 PT Individual Time:1300  - 1420, 80 min      Short Term Goals: Week 1:  PT Short Term Goal 1 (Week 1): pt will move supine> sit with supervision PT Short Term Goal 2 (Week 1): pt will perform basic transfer with +1 consistently PT Short Term Goal 3 (Week 1): pt will propel w/c x 100' PT Short Term Goal 4 (Week 1): pt will perform gait with LRAD x 35' with min assist PT Short Term Goal 5 (Week 1): pt will demonstrate knowledge of back precautions when moving in/out of bed wiht 0-1 cue  Skilled Therapeutic Interventions/Progress Updates:   Pt resting in bed.  PT reviewed back precautions with pt and family.   Supine BP with ACE wraps on bil LEs : 118/68, HR 102, O2 sats 94%.   Rolling R with supervision, and sitting up with min assist.  No dizziness. PT donned pt's TLSO.   Pulling up with bil hands on RW, max assist to stand, from raised bed.  Min assist stand pivot transfer with RW.  W/c propulsion using bil UEs on level tile x 100' with supervision, including turns.  Sit> stand +2 to wide RW.  Gait training with RW x 12' with min assist.  Pt sat due to fatigue.  Pt has flexed trunk, R hip external rotation, short step lengths, , limited bil hip flexion, but no knee buckling.  Simulated car transfer with +2 to stand, then min assist to transfer, using wide RW.   Seated strengthening exercises, 15 x 1 each R/L long arc quad knee extension, bil hip adduction, bil heel raises.  Focus on eccentric control.   Up/down (1) 3" high step, mod assist to stand pulling up on bil railing, min assist for step to method, and backed down to descend steps.  Therapy Documentation Precautions:  Precautions Precautions: Back Precaution Comments: re-educated on BLT, pt cont to be reminded during function Required Braces or Orthoses: Spinal Brace Spinal Brace:  Thoracolumbosacral orthotic, Applied in sitting position(Able to doff brace when sitting in chair and for showering) Restrictions Weight Bearing Restrictions: No   Vital Signs: Therapy Vitals Pulse Rate: (!) 104 BP: 118/68, lying Oxygen Therapy SpO2: 94 % Pain: "not much at all" unrated. premedicated       Therapy/Group: Individual Therapy  [name] 07/01/2018, 4:24 PM

## 2018-07-01 NOTE — Progress Notes (Signed)
[place] PHYSICAL MEDICINE & REHABILITATION PROGRESS NOTE  Subjective/Complaints: Patient seen laying in bed this AM.  He states he slept fairly overnight.  He states he tolerated the procedure well yesterday.  He is ready to begin therapy this morning.  Family at bedside.  ROS: Denies chest pain, shortness of breath, nausea, vomiting, diarrhea.  Objective: Vital Signs: Blood pressure 107/66, pulse (!) 106, temperature 98.6 F (37 C), temperature source Oral, resp. rate 18, height 5\' 10"  (1.778 m), weight 96.8 kg, SpO2 95 %. Ir Ivc Filter Plmt / S&i /img Guid/mod Sed  Result Date: 06/30/2018 INDICATION: 73 year old with metastatic lung cancer. Recent lumbar spine surgery and history of retroperitoneal hematoma. Patient now has a DVT in the left common femoral vein and not a candidate for anticoagulation. EXAM: IVC FILTER PLACEMENT; IVC VENOGRAM; ULTRASOUND FOR VASCULAR ACCESS Physician: Stephan Minister. [name], MD MEDICATIONS: None. ANESTHESIA/SEDATION: Fentanyl 50 mcg IV; Versed 2.0 mg IV Moderate Sedation Time:  21 minutes The patient was continuously monitored during the procedure by the interventional radiology nurse under my direct supervision. CONTRAST:  70 mL Isovue-300 FLUOROSCOPY TIME:  Fluoroscopy Time: 2 minutes 36 seconds (90 mGy). COMPLICATIONS: None immediate. PROCEDURE: The procedure was explained to the patient. The risks and benefits of the procedure were discussed and the patient's questions were addressed. Informed consent was obtained from the patient. Ultrasound demonstrated a patent right internal jugular vein. Ultrasound images were obtained for documentation. The right side of the neck was prepped and draped in a sterile fashion. Maximal barrier sterile technique was utilized including caps, mask, sterile gowns, sterile gloves, sterile drape, hand hygiene and skin antiseptic. The skin was anesthetized with 1% lidocaine. A 21 gauge needle was directed into the vein with ultrasound guidance  and a micropuncture dilator set was placed. A wire was advanced into the IVC. The filter sheath was advanced over the wire into the IVC. An IVC venogram was performed. Fluoroscopic images were obtained for documentation. A Bard Denali filter was deployed below the lowest renal vein. A follow-up venogram was performed and the vascular sheath was removed with manual compression. FINDINGS: IVC was patent. Bilateral renal veins were identified. Narrowing in the distal IVC secondary to external compression from the adjacent abdominal aortic aneurysm. The filter was placed below the renal veins and just above the IVC external compression. IMPRESSION: Successful placement of a retrievable IVC filter. PLAN: This IVC filter is potentially retrievable. The patient will be assessed for filter retrieval by Interventional Radiology in approximately 8-12 weeks. Further recommendations regarding filter retrieval, continued surveillance or declaration of device permanence, will be made at that time. Electronically Signed   By: Markus Daft M.D.   On: 06/30/2018 17:51   Vas [place] Lower Extremity Venous (dvt)  Result Date: 06/29/2018  Lower Venous Study Indications: Immobility.  Performing Technologist: Oliver Hum RVT  Examination Guidelines: A complete evaluation includes B-mode imaging, spectral Doppler, color Doppler, and power Doppler as needed of all accessible portions of each vessel. Bilateral testing is considered an integral part of a complete examination. Limited examinations for reoccurring indications may be performed as noted.  Right Venous Findings: +---------+---------------+---------+-----------+----------+-------+          CompressibilityPhasicitySpontaneityPropertiesSummary +---------+---------------+---------+-----------+----------+-------+ CFV      Full           Yes      Yes                          +---------+---------------+---------+-----------+----------+-------+ SFJ  Full                                                  +---------+---------------+---------+-----------+----------+-------+ FV Prox  Full                                                 +---------+---------------+---------+-----------+----------+-------+ FV Mid   Full                                                 +---------+---------------+---------+-----------+----------+-------+ FV DistalFull                                                 +---------+---------------+---------+-----------+----------+-------+ PFV      Full                                                 +---------+---------------+---------+-----------+----------+-------+ POP      Full           Yes      Yes                          +---------+---------------+---------+-----------+----------+-------+ PTV      Full                                                 +---------+---------------+---------+-----------+----------+-------+ PERO     Full                                                 +---------+---------------+---------+-----------+----------+-------+  Left Venous Findings: +---------+---------------+---------+-----------+----------+--------------+          CompressibilityPhasicitySpontaneityPropertiesSummary        +---------+---------------+---------+-----------+----------+--------------+ CFV      Partial        Yes      Yes                  Acute          +---------+---------------+---------+-----------+----------+--------------+ SFJ      Full                                                        +---------+---------------+---------+-----------+----------+--------------+ FV Prox  Full                                                        +---------+---------------+---------+-----------+----------+--------------+  FV Mid   Full                                                        +---------+---------------+---------+-----------+----------+--------------+ FV DistalFull                                                         +---------+---------------+---------+-----------+----------+--------------+ PFV      Full                                                        +---------+---------------+---------+-----------+----------+--------------+ POP      Full           Yes      Yes                                 +---------+---------------+---------+-----------+----------+--------------+ PTV      Full                                                        +---------+---------------+---------+-----------+----------+--------------+ PERO     Full                                                        +---------+---------------+---------+-----------+----------+--------------+ EIV                     Yes      Yes                                 +---------+---------------+---------+-----------+----------+--------------+ CIV                                                   Not visualized +---------+---------------+---------+-----------+----------+--------------+    Summary: Right: There is no evidence of deep vein thrombosis in the lower extremity. No cystic structure found in the popliteal fossa. Left: Findings consistent with acute deep vein thrombosis involving the left common femoral vein. No cystic structure found in the popliteal fossa. Unable to visualize the common iliac vein due to bowel gas.  *See table(s) above for measurements and observations. Electronically signed by Sherren Mocha MD on 06/29/2018 at 8:05:33 PM.    Final    Recent Labs    06/30/18 0619  WBC 10.3  HGB 9.3*  HCT 29.6*  PLT 373   Recent Labs    06/30/18 0619  NA 135  K 3.0*  CL  106  CO2 22  GLUCOSE 115*  BUN 7*  CREATININE 0.65  CALCIUM 7.2*    Physical Exam: BP 107/66   Pulse (!) 106   Temp 98.6 F (37 C) (Oral)   Resp 18   Ht 5\' 10"  (1.778 m)   Wt 96.8 kg   SpO2 95%   BMI 30.62 kg/m  Constitutional: No distress . Vital signs  reviewed. HENT: Normocephalic.  Atraumatic. Eyes: EOMI. No discharge. Cardiovascular: RRR.  No JVD. Respiratory: CTA bilaterally.  Normal effort. GI: BS +. Non-distended. Musculoskeletal: Bilateral lower extremity edema, left greater than right Neurological: He is alert and oriented  Dysphonia  Able to follow basic commands without difficulty.  Motor: Bilateral UE 5/5 proximal to distal Right lower extremity: 4-4+/5 proximal distal Left lower extremity: Hip flexion 3/5, knee extension 4-/5, ankle dorsiflexion 4/5 Skin: Rash improving Psychiatric: His behavior is normal.Judgmentand thought contentnormal. His affect is blunt.  Assessment/Plan: 1. Functional deficits secondary to lung cancer with mets to ribs, vertebrae with oncological fracture status post lumbar fusion which require 3+ hours per day of interdisciplinary therapy in a comprehensive inpatient rehab setting.  Physiatrist is providing close team supervision and 24 hour management of active medical problems listed below.  Physiatrist and rehab team continue to assess barriers to discharge/monitor patient progress toward functional and medical goals  Care Tool:  Bathing              Bathing assist       Upper Body Dressing/Undressing Upper body dressing        Upper body assist      Lower Body Dressing/Undressing Lower body dressing            Lower body assist       Toileting Toileting    Toileting assist Assist for toileting: Moderate Assistance - Patient 50 - 74%     Transfers Chair/bed transfer  Transfers assist     Chair/bed transfer assist level: Minimal Assistance - Patient > 75%     Locomotion Ambulation   Ambulation assist   Ambulation activity did not occur: Safety/medical concerns(hypotensive)          Walk 10 feet activity   Assist           Walk 50 feet activity   Assist           Walk 150 feet activity   Assist           Walk 10  feet on uneven surface  activity   Assist           Wheelchair     Assist Will patient use wheelchair at discharge?: Yes Type of Wheelchair: Manual Wheelchair activity did not occur: Safety/medical concerns(hypotensive)         Wheelchair 50 feet with 2 turns activity    Assist            Wheelchair 150 feet activity     Assist            Medical Problem List and Plan: 1.Functional and mobility deficitssecondary to metastatic lung cancer to left clavicle, ribs, L4 vertebral body with fracture. Pt is s/p L4 corpectomy and L3-5 fusion by Dr. Rolena Infante on 06/11/18  Begin CIR 2. DVT: dopplers done this evening and patient has DVT in left CFV             IVC filter placement on 1/8  Pt not a candidate for anticoagulation due to hematoma and bleeding riskat present  although long term he is high VTE risk 3. Pain Management:Continue Fentanyl 12.5 mcg with norco/dilaudid prn. Unable to tolerate Oxycodone. Monitor patient's pain with increased activity. 4. Mood:LCSW to follow for evaluation and support. 5. Neuropsych: This patientiscapable of making decisions on hisown behalf. 6. Skin/Wound Care:Monitor wound for healing. Has been refusing ensure--Added prostat to help promote wound healing. 7. Fluids/Electrolytes/Nutrition:Monitor I/O.  8.Blood pressure; Intermittent elevation noted   Relatively controlled on 1/9  Monitor with increased mobility 9. ABLA: Continue to monitor with serial checks.   Hemoglobin 9.3 on 1/8 10. Hypokalemia:   Potassium 3.0 on 1/8   Labs ordered for tomorrow   Supplement initiated  Magnesium 1.7 on 1/8 11. Rash: Continue Clobetasol bidas well as hypoallergenic sheets. 12. Neuropathic pain: Improving on gabapentin 300 mg tid 13. Constipation:Managed with miralax prn--patient wants to manage his bowel program. 14.  Hypoalbuminemia  Supplement initiated on 1/8   LOS: 2 days A FACE TO FACE EVALUATION WAS  PERFORMED  Elvy Mclarty [name] 07/01/2018, 10:44 AM

## 2018-07-01 NOTE — Progress Notes (Addendum)
Daily Progress Note   Patient Name: Brian [name]       Date: 07/01/2018 DOB: 04-18-1946  Age: 73 y.o. MRN#: [id number] Attending Physician: Brian Arn, MD Primary Care Physician: Brian Rocker, MD Admit Date: 06/29/2018  Reason for Consultation/Follow-up: Establishing goals of care  Subjective: Arrived to scheduled [place] meeting. Wife and daughter present, they state therapy has just worked with him and he is tired. Patient is resting in bed with eyes closed. He has planned therapy this afternoon at 1:00 as well. Spoke with therapy and plans to move [place] conversation to 9:15 tomorrow morning. Issue with hypotension today during therapy.   Wife left room to go downstairs. Daughter discusses that she lives in [place]. She discusses that she is exactly where she needs to be (with her father) but that this has made work difficult. She expresses that she has around 8 part time jobs and is worried about job security with missing work.   During this conversation, [name] awoke. He states pain was not an issue today during therapy. No changes to regimen today.  Length of Stay: 2  Current Medications: Scheduled Meds:  . clobetasol ointment   Topical BID  . feeding supplement (PRO-STAT SUGAR FREE 64)  30 mL Oral BID  . fentaNYL  12.5 mcg Transdermal Q72H  . gabapentin  300 mg Oral TID  . guaiFENesin  10 mL Oral Q6H  . pantoprazole  40 mg Oral Daily  . potassium chloride  20 mEq Oral Daily  . senna  1 tablet Oral QHS    Continuous Infusions:   PRN Meds: acetaminophen, albuterol, alum & mag hydroxide-simeth, bisacodyl, camphor-menthol, diphenhydrAMINE, guaiFENesin-dextromethorphan, HYDROcodone-acetaminophen, HYDROmorphone HCl, methocarbamol, polyethylene glycol, prochlorperazine  **OR** prochlorperazine **OR** prochlorperazine, sodium phosphate, traZODone  Physical Exam Pulmonary:     Effort: Pulmonary effort is normal.  Skin:    General: Skin is warm and dry.             Vital Signs: BP 107/66   Pulse (!) 106   Temp 98.6 F (37 C) (Oral)   Resp 18   Ht 5\' 10"  (1.778 m)   Wt 96.8 kg   SpO2 95%   BMI 30.62 kg/m  SpO2: SpO2: 95 % O2 Device: O2 Device: Room Air O2 Flow Rate: O2 Flow Rate (L/min): 2  L/min  Intake/output summary:   Intake/Output Summary (Last 24 hours) at 07/01/2018 1154 Last data filed at 07/01/2018 0706 Gross per 24 hour  Intake 0 ml  Output 1175 ml  Net -1175 ml   LBM: Last BM Date: 06/30/18 Baseline Weight: Weight: 96.8 kg Most recent weight: Weight: 96.8 kg       Palliative Assessment/Data: 30%      Patient Active Problem List   Diagnosis Date Noted  . DVT of lower extremity (deep venous thrombosis) ([place])   . Malignant neoplasm of lung ([place])   . Hypoalbuminemia due to protein-calorie malnutrition ([place])   . Acute blood loss anemia   . Postoperative pain   . Pathological compression fracture of lumbar vertebra ([place]) 06/29/2018  . Metastatic lung cancer (metastasis from lung to other site) ([place]) 06/28/2018  . Rash 06/25/2018  . Hypokalemia 06/25/2018  . Normocytic anemia 06/25/2018  . S/P lumbar fusion 06/24/2018  . Palliative care by specialist   . Cancer related pain   . Lung cancer, primary, with metastasis from lung to other site, right ([place]) 06/14/2018  . Squamous cell lung cancer, right ([place]) 06/14/2018  . Lung cancer metastatic to bone ([place]) 06/14/2018  . Bronchitis due to tobacco use 06/07/2018  . Lumbar spine tumor 06/04/2018    Palliative Care Assessment & Plan   Recommendations/Plan:  [place] conversation 1/10 at 9:15 am.   Recommend Fentanyl patch for sustained pain control.  Recommend Tylenol for mild pain. Recommend Norco for moderate pain. Recommend Dilaudid for severe pain.  Recommend Robaxin  for muscle spasms. Recommend increasing Neurontin as needed for neuropathic pain.   Code Status:    Code Status Orders  (From admission, onward)         Start     Ordered   06/29/18 1526  Full code  Continuous     06/29/18 1525        Code Status History    Date Active Date Inactive Code Status Order ID Comments User Context   06/11/2018 2031 06/29/2018 1440 Full Code [id number]  Brian Loll, MD Inpatient   06/11/2018 2011 06/11/2018 2030 Full Code [id number]  Brian Schools, MD Inpatient   06/04/2018 1344 06/11/2018 2011 Full Code [id number]  Brian Jourdain, PA-C Inpatient       Prognosis:  Poor overall. Metastatic cancer with pathologic fracture diagnosed in December.   Discharge Planning:  To Be Determined  Care plan was discussed with RN  Thank you for allowing the Palliative Medicine Team to assist in the care of this patient.   Time In: 10:40 Time Out: 11:15 Total Time 35 min Prolonged Time Billed  no      Greater than 50%  of this time was spent counseling and coordinating care related to the above assessment and plan.  Brian Gowda, NP  Please contact Palliative Medicine Team phone at [phone] for questions and concerns.

## 2018-07-01 NOTE — Progress Notes (Signed)
Orthopedic Tech Progress Note Patient Details:  Brian [name] July 30, 1945 [id number]  Ortho Devices Type of Ortho Device: Abdominal binder Ortho Device/Splint Location: delivered to room to RN Ortho Device/Splint Interventions: Renard Matter 07/01/2018, 10:22 AM

## 2018-07-01 NOTE — Evaluation (Signed)
Occupational Therapy Assessment and Plan  Patient Details  Name: Brian [name] MRN: [id number] Date of Birth: June 25, 1945  OT Diagnosis: abnormal posture, acute pain, lumbago (low back pain), muscle weakness (generalized) and pain in thoracic spine Rehab Potential: Rehab Potential (ACUTE ONLY): Fair ELOS: 2-2.5 weeks   Today's Date: 07/01/2018 OT Individual Time: 0900-1000 OT Individual Time Calculation (min): 60 min     Problem List:  Patient Active Problem List   Diagnosis Date Noted  . DVT of lower extremity (deep venous thrombosis) ([place])   . Malignant neoplasm of lung ([place])   . Hypoalbuminemia due to protein-calorie malnutrition ([place])   . Acute blood loss anemia   . Postoperative pain   . Pathological compression fracture of lumbar vertebra ([place]) 06/29/2018  . Metastatic lung cancer (metastasis from lung to other site) ([place]) 06/28/2018  . Rash 06/25/2018  . Hypokalemia 06/25/2018  . Normocytic anemia 06/25/2018  . S/P lumbar fusion 06/24/2018  . Palliative care by specialist   . Cancer related pain   . Lung cancer, primary, with metastasis from lung to other site, right ([place]) 06/14/2018  . Squamous cell lung cancer, right ([place]) 06/14/2018  . Lung cancer metastatic to bone ([place]) 06/14/2018  . Bronchitis due to tobacco use 06/07/2018  . Lumbar spine tumor 06/04/2018    Past Medical History:  Past Medical History:  Diagnosis Date  . Arthritis    Past Surgical History:  Past Surgical History:  Procedure Laterality Date  . ANTERIOR CERVICAL CORPECTOMY Left 06/11/2018   Procedure: L4 Corpectomy, L3-5 anterior reconstruction,;  Surgeon: Melina Schools, MD;  Location: [place];  Service: Orthopedics;  Laterality: Left;  6.5 hrs- Ok for this day/time per April  . CATARACT EXTRACTION, BILATERAL    . DECOMPRESSIVE LUMBAR LAMINECTOMY LEVEL 1 N/A 06/24/2018   Procedure: DECOMPRESSIVE LUMBAR 4-5 LAMINECTOMY LEVEL 1;  Surgeon: Melina Schools, MD;  Location: [facility];  Service:  Orthopedics;  Laterality: N/A;  . IR IVC FILTER PLMT / S&I Burke Keels GUID/MOD SED  06/30/2018    Assessment & Plan Clinical Impression: Brian [name] is a63 year old male with history of RA, emphysema, tobacco abuse with low back and hip pain. Work-up done revealing bronchogenic RLL lung cancer with bony mets to left clavicle, right eighth rib, L4 vertebral body with fracture and 2.5 cm liver lesion. He was admitted from office for work-up on 101/09/202019 and surgical intervention of pathological fracture recommended by Dr. Marin Olp. He underwent L4 corpectomy with L3-L5 fusion by Dr. Rolena Infante on 12/20. Perioperatively had blood loss anemia with hematoma at corpectomy site requiring 4 units PRBC and 2 units FFP. Surgery stopped due to bleeding and CT abdomen/pelvis revealed small retroperitoneal hematoma extending to psoas muscle. Once stabilized, he was taken back to the OR on 12/12 for screw fixation L3-L5. Pathology revealed metastatic squamous cell lung cancer with plans for HRT in future.  He continuedto have issues with severe pain as well as bilateral lower extremity weakness. Follow up MRI spine 12/27 revealed retroperitoneal hematoma, s/p corpectomy with L3-5 fusion and unchanged severe spinal canal stenosis L4 level. He hasrequired multiple units packed red blood cells as well as IV iron for anemia. Follow-up CT lumbar spine 1/1revealed large right and small left retroperitoneal hematomas expanding psoas muscle--unchanged. Rash on back stable. Palliative care consulted for symptom management as well as GOC. Fentanyl and dilaudid added for pain control and due to ongoing issues with neuropathy was taken back to OR on 06/24/18 for L4/5 Gill decompression. He has had  improvement in pain control and mobility and CIR recommended due to functional deficits.  Patient transferred to CIR on 06/29/2018 .    Patient currently requires max with basic self-care skills secondary to muscle weakness,  decreased cardiorespiratoy endurance and decreased sitting balance, decreased standing balance, decreased postural control, decreased balance strategies and difficulty maintaining precautions.  Very limited OT eval 2/2 low BP, dropping with upright positioning. ACE wraps donned, TED hose already placed. RN aware and ordered abdominal binder. Prior to hospitalization, patient recently required min A with ADLs due to gradual decline in function since Oct. 2019. Prior to October, pt could complete ADLs/IADLs complete ADLs with independent .  Patient will benefit from skilled intervention to decrease level of assist with basic self-care skills and increase independence with basic self-care skills prior to discharge home with care partner.  Anticipate patient will require minimal physical assistance and follow up home health.  OT - End of Session Activity Tolerance: Tolerates < 10 min activity with changes in vital signs Endurance Deficit: Yes Endurance Deficit Description: Drop in BP with positional changes. Unable to tolerate sitting up in recliner >1hr OT Assessment Rehab Potential (ACUTE ONLY): Fair OT Barriers to Discharge: Inaccessible home environment;Home environment access/layout;Medical stability OT Barriers to Discharge Comments: Multi-level home. Cancer dx OT Patient demonstrates impairments in the following area(s): Balance;Vision;Behavior;Pain;Perception;Safety;Endurance OT Basic ADL's Functional Problem(s): Grooming;Dressing;Bathing;Toileting OT Transfers Functional Problem(s): Toilet;Tub/Shower OT Additional Impairment(s): None OT Plan OT Intensity: Minimum of 1-2 x/day, 45 to 90 minutes OT Frequency: 5 out of 7 days OT Duration/Estimated Length of Stay: 2-2.5 weeks OT Treatment/Interventions: Balance/vestibular training;Discharge planning;DME/adaptive equipment instruction;Functional mobility training;Neuromuscular re-education;Patient/family education;Pain management;Psychosocial  support;Self Care/advanced ADL retraining;Skin care/wound managment;Therapeutic Activities;Therapeutic Exercise;UE/LE Coordination activities;UE/LE Strength taining/ROM;Wheelchair propulsion/positioning OT Self Feeding Anticipated Outcome(s): Indep.  OT Basic Self-Care Anticipated Outcome(s): Min A OT Toileting Anticipated Outcome(s): Min A OT Bathroom Transfers Anticipated Outcome(s): Min A OT Recommendation Recommendations for Other Services: Neuropsych consult Patient destination: Home Follow Up Recommendations: Home health OT Equipment Recommended: To be determined   Skilled Therapeutic Intervention Pt seen for scheduled OT eval and session, eval and mobility very limited 2/2 BP. Pt sitting up in recliner upon arrival with hand off from PT, wife present. Discussed with pt, wife and PT events of PT session with limited mobility following stand pivot transfer to recliner 2/2 low BP. Pt willing to participate in OT session as able. BP sitting in recliner with TED hose donned and feet elevated: 96/57. When feet lowered and sitting upright in chair, BP 82/68. PT retruend to reclined position with feet elevated and ACE wraps donned on top of TED hose. BP 119/76 with ACE wraps. RN made aware of events of session and order placed for abdominal binder.  Pt voicing increased fatigue and discomfort from sitting in recliner and ready to transition back to bed. He required max A +2 to stand from low recliner into STEDY. Transferred onto bed and max A to return to supine with cuing for log rolling technique. Pt left in supine at end of session, RN and wife present, all needs in reach. Extensive education provided throughout session regarding role of OT, POC, OT goals, activity progression, pain control, low BP and ways to elevate and acclimate to upright positioning and d/c planning.   OT Evaluation Precautions/Restrictions  Precautions Precautions: Back Required Braces or Orthoses: Spinal Brace Spinal  Brace: Thoracolumbosacral orthotic;Applied in sitting position(Able to doff brace when sitting in chair and for showering) Restrictions Weight Bearing Restrictions: No General Chart Reviewed:  Yes Pain   3/10 pain in back while at rest. Was pre-medicated prior to tx session. Repositioned back in bed at end of tx session for comfort Home Living/Prior [place] expects to be discharged to:: Private residence Living Arrangements: Spouse/significant other Available Help at Discharge: Family, Available 24 hours/day Type of Home: House Home Access: Stairs to enter [employer] of Steps: 4+ 1 thresold to enter [employer]: Right Home Layout: One level Bathroom Shower/Tub: Multimedia programmer: Standard Bathroom Accessibility: No Additional Comments: Bathroom did not accomodate RW well, having to turn sideways to enter bathroom  Lives With: Spouse IADL History Homemaking Responsibilities: Yes Current License: Yes Mode of Transportation: Car Occupation: Retired Type of Occupation: Retired Engineer, maintenance (IT) Prior Function Level of Independence: Independent with basic ADLs, Independent with transfers, Independent with gait(Was independent prior to 03/2018. 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 Vision/History: Wears glasses Wears Glasses: At all times Patient Visual Report: Other (comment)(Reports recent decreased accuity ) Vision Assessment?: Yes Ocular Range of Motion: Within Functional Limits Convergence: Impaired - to be further tested in functional context Perception  Perception: Within Functional Limits Praxis Praxis: Intact Cognition Overall Cognitive Status: Within Functional Limits for tasks assessed(requires increased time for processing verbal  directions) Arousal/Alertness: Awake/alert Orientation Level: Person;Place;Situation Person: Oriented Place: Oriented Situation: Oriented Year: 2020 Month: January Day of Week: Correct Memory: Appears intact Immediate Memory Recall: Sock;Blue;Bed Memory Recall: Sock;Blue Memory Recall Blue: With Cue Awareness: Appears intact Problem Solving: Appears intact Safety/Judgment: Appears intact Comments: All cognitive and motor tasks slightly delayed 2/2 fatigue and affects of pain meds Sensation Sensation Light Touch: Appears Intact Coordination Gross Motor Movements are Fluid and Coordinated: No Fine Motor Movements are Fluid and Coordinated: Yes Coordination and Movement Description: Impaired 2/2 severe fatigue  Motor  Motor Motor: Other (comment) Motor - Skilled Clinical Observations: generalized weakness/deconditioning Trunk/Postural Assessment  Cervical Assessment Cervical Assessment: Within Functional Limits Thoracic Assessment Thoracic Assessment: Exceptions to WFL(Back pre-cautions) Lumbar Assessment Lumbar Assessment: Exceptions to WFL(Back pre-cautions) Postural Control Postural Control: Within Functional Limits  Balance Balance Balance Assessed: Yes Static Sitting Balance Static Sitting - Level of Assistance: 5: Stand by assistance;4: Min assist Dynamic Sitting Balance Dynamic Sitting - Balance Support: Feet supported Dynamic Sitting - Level of Assistance: 4: Min assist Dynamic Sitting Balance - Compensations: Sitting EOB Sitting balance - Comments: able to tolerate sitting EOB without UE support x 15 sec. sitting balance improving Static Standing Balance Static Standing - Level of Assistance: 1: +2 Total assist Dynamic Standing Balance Dynamic Standing - Balance Support: During functional activity Dynamic Standing - Level of Assistance: 1: +2 Total assist Dynamic Standing - Comments: +2 assist standing in STEDY Extremity/Trunk Assessment RUE  Assessment General Strength Comments: Not formally assessed, however, able to use to assist with pulling up into standing in STEDY LUE Assessment LUE Assessment: Not tested General Strength Comments: Not assessed 2/2 L clavicle fx. Able to use L UE to assist with pulling up into STEDY     Refer to Care Plan for Long Term Goals  Recommendations for other services: Neuropsych   Discharge Criteria: Patient will be discharged from OT if patient refuses treatment 3 consecutive times without medical reason, if treatment goals not met, if there is a change in medical status, if patient makes no progress towards goals or if patient is discharged from  hospital.  The above assessment, treatment plan, treatment alternatives and goals were discussed and mutually agreed upon: by patient and by family  Annalyn Blecher L 07/01/2018, 12:49 PM

## 2018-07-01 NOTE — Evaluation (Addendum)
Physical Therapy Assessment and Plan  Patient Details  Name: Brian [name] MRN: [id number] Date of Birth: 01/16/1946  PT Diagnosis: Abnormality of gait, Difficulty walking, Dizziness and giddiness, Edema, Low back pain and Muscle weakness Rehab Potential:0800-0900, 60 min Good ELOS: 15-21 days   Today's Date: 07/01/2018 PT Individual Time: 1300-1420 PT Individual Time Calculation (min): 80 min    Problem List:  Patient Active Problem List   Diagnosis Date Noted  . DVT of lower extremity (deep venous thrombosis) ([place])   . Malignant neoplasm of lung ([place])   . Hypoalbuminemia due to protein-calorie malnutrition ([place])   . Acute blood loss anemia   . Postoperative pain   . Pathological compression fracture of lumbar vertebra ([place]) 06/29/2018  . Metastatic lung cancer (metastasis from lung to other site) ([place]) 06/28/2018  . Rash 06/25/2018  . Hypokalemia 06/25/2018  . Normocytic anemia 06/25/2018  . S/P lumbar fusion 06/24/2018  . Palliative care by specialist   . Cancer related pain   . Lung cancer, primary, with metastasis from lung to other site, right ([place]) 06/14/2018  . Squamous cell lung cancer, right ([place]) 06/14/2018  . Lung cancer metastatic to bone ([place]) 06/14/2018  . Bronchitis due to tobacco use 06/07/2018  . Lumbar spine tumor 06/04/2018    Past Medical History:  Past Medical History:  Diagnosis Date  . Arthritis    Past Surgical History:  Past Surgical History:  Procedure Laterality Date  . ANTERIOR CERVICAL CORPECTOMY Left 06/11/2018   Procedure: L4 Corpectomy, L3-5 anterior reconstruction,;  Surgeon: Melina Schools, MD;  Location: [facility];  Service: Orthopedics;  Laterality: Left;  6.5 hrs- Ok for this day/time per April  . CATARACT EXTRACTION, BILATERAL    . DECOMPRESSIVE LUMBAR LAMINECTOMY LEVEL 1 N/A 06/24/2018   Procedure: DECOMPRESSIVE LUMBAR 4-5 LAMINECTOMY LEVEL 1;  Surgeon: Melina Schools, MD;  Location: [facility];  Service: Orthopedics;  Laterality: N/A;   . IR IVC FILTER PLMT / S&I Burke Keels GUID/MOD SED  06/30/2018    Assessment & Plan Clinical Impression:  Brian [name] is a16 year old male with history of RA, emphysema, tobacco abuse with low back and hip pain. Work-up done revealing bronchogenic RLL lung cancer with bony mets to left clavicle, right eighth rib, L4 vertebral body with fracture and 2.5 cm liver lesion. He was admitted from office for work-up on 1Aug 15, 202019 and surgical intervention of pathological fracture recommended by Dr. Marin Olp. He underwent L4 corpectomy with L3-L5 fusion by Dr. Rolena Infante on 12/20. Perioperatively had blood loss anemia with hematoma at corpectomy site requiring 4 units PRBC and 2 units FFP. Surgery stopped due to bleeding and CT abdomen/pelvis revealed small retroperitoneal hematoma extending to psoas muscle. Once stabilized, he was taken back to the OR on 12/12 for screw fixation L3-L5. Pathology revealed metastatic squamous cell lung cancer with plans for HRT in future.  He continuedto have issues with severe pain as well as bilateral lower extremity weakness. Follow up MRI spine 12/27 revealed retroperitoneal hematoma, s/p corpectomy with L3-5 fusion and unchanged severe spinal canal stenosis L4 level. He hasrequired multiple units packed red blood cells as well as IV iron for anemia. Follow-up CT lumbar spine 1/1revealed large right and small left retroperitoneal hematomas expanding psoas muscle--unchanged. Rash on back stable. Palliative care consulted for symptom management as well as GOC. Fentanyl and dilaudid added for pain control and due to ongoing issues with neuropathy was taken back to OR on 06/24/18 for L4/5 Gill decompression.  Patient transferred to CIR on  06/29/2018 .   Patient currently requires max  assist to stand (locomotion not attempted) secondary to muscle weakness and muscle joint tightness, decreased cardiorespiratoy endurance and decreased oxygen support, impaired timing and  sequencing and decreased motor planning and decreased sitting balance, decreased standing balance, decreased balance strategies and difficulty maintaining precautions.  Prior to hospitalization, patient was supervision with mobility and lived with Spouse in a House home.  Home access is 4 steps+ 1 threshold to enter, R rail.   Patient will benefit from skilled PT intervention to maximize safe functional mobility, minimize fall risk and decrease caregiver burden for planned discharge home with 24 hour assist.  Anticipate patient will benefit from follow up [facility] at discharge.  PT - End of Session Activity Tolerance: Tolerates < 10 min activity with changes in vital signs Endurance Deficit: Yes Endurance Deficit Description: Drop in BP with positional changes. Unable to tolerate sitting up in recliner >1hr PT Assessment Rehab Potential (ACUTE/IP ONLY): Good PT Patient demonstrates impairments in the following area(s): Balance;Edema;Endurance;Motor;Pain PT Transfers Functional Problem(s): Bed Mobility;Bed to Chair;Car;Furniture PT Locomotion Functional Problem(s): Ambulation;Wheelchair Mobility;Stairs PT Plan PT Intensity: Minimum of 1-2 x/day ,45 to 90 minutes PT Frequency: 5 out of 7 days PT Duration Estimated Length of Stay: 15-21 days PT Treatment/Interventions: Ambulation/gait training;Discharge planning;Functional mobility training;Psychosocial support;Therapeutic Activities;Balance/vestibular training;Neuromuscular re-education;Therapeutic Exercise;Wheelchair propulsion/positioning;DME/adaptive equipment instruction;Pain management;Splinting/orthotics;UE/LE Strength taining/ROM;Community reintegration;Patient/family education;Stair training;UE/LE Coordination activities PT Transfers Anticipated Outcome(s): min assist basic and car PT Locomotion Anticipated Outcome(s): S w/c x 150' controlled and 50' home; S gait x 77' with LRAD and min assist up/down 4 steps +threshold  R rail PT  Recommendation Follow Up Recommendations: Home health PT Patient destination: Home Equipment Recommended: To be determined  Skilled Therapeutic Intervention Pt seen in room for eval.  His wife and dtr were present.  Pt and family provided information regarding home and PLOF.  PT donned bil TEDs and non slip socks with pt in bed.  Pt remembered 2/3 back precautions.  Pt was dizzy upon sitting up, which resolved somewhat over 5 minutes of sitting.  Pt donned shirt with min assist after set up.  PT donned TLSO. He was able to pull up with bil hands on RW to eventually come to standing from raised bed with mod assist.  Once standing, min assist for stand pivot.  Pt transferred to recliner as present w/c is too narrow for him. Pt reported feeling "light headed" sitting in recliner with bil LEs elevated and back reclined. See BP below.  PT brough 20 x 18 w/c to attempt in 2nd session later today.  W/c propulsion, simulated car transfer,gait and steps not attempted this session due to hypotension.  PT consulted with Amy, OT about hypotension and pt's present status.  She will don bil LE TEDS, and request abdominal binder.  PT informed Neoma Laming, RN about hypotension.  PT educated pt and family about POC, schedule and ELOS.  Pt left resting in recliner for next session with Amy, OT entering room.  PT Evaluation Precautions/Restrictions Precautions Precautions: Back Precaution Comments: re-educated on precautions, pt cont to be reminded during function Pt stated 2/3 back precautions Required Braces or Orthoses: Spinal Brace Spinal Brace: Thoracolumbosacral orthotic;Applied in sitting position Restrictions Weight Bearing Restrictions: No General   Vital SignsTherapy Vitals Pulse Rate: (!) 117 Resp: 18 BP: 88/59 Patient Position (if appropriate): sitting Oxygen Therapy SpO2: 94 % O2 Device: Room Air Pain Pain Assessment Pain Scale: 0-10 Pain Score: 3  Pain Location: Back(and bil thighs  laterally) Pain Orientation: Mid;Lower Pain Radiating Towards: (back) Pain Descriptors / Indicators: Aching Pain Intervention(s): Medication (See eMAR) Home Living/Prior Functioning Home Living Available Help at Discharge: Family;Available 24 hours/day Type of Home: House Home Access: Stairs to enter [employer] of Steps: 4+ 1 thresold to enter [employer]: Right Home Layout: One level Bathroom Shower/Tub: Multimedia programmer: Standard Additional Comments: two cats- within the home but 10 total in the house but are in a special room in the house  Lives With: Spouse Prior Function  Able to Take Stairs?: Yes Driving: No Vocation: Retired Biomedical scientist: retired Optometrist Vision/Perception  [place] of Motion: Within TEFL teacher Pursuits: Able to track stimulus in all quads without difficulty Convergence: Impaired - to be further tested in functional context  Cognition Overall Cognitive Status: Within Functional Limits for tasks assessed Arousal/Alertness: Awake/alert Orientation Level: Oriented X4 Safety/Judgment: Appears intact Comments: All cognitive and motor tasks slightly delayed 2/2 fatigue and effects of pain meds Sensation:  LT and proprioception appear intact   Motor  Motor Motor: Other (comment) Motor - Skilled Clinical Observations: generalized weakness ; proximal > distal bil LEs Mobility Bed Mobility Bed Mobility: Rolling Right;Rolling Left;Left Sidelying to Sit Rolling Right: Supervision/verbal cueing Rolling Left: Supervision/Verbal cueing Left Sidelying to Sit: Moderate Assistance - Patient 50-74% Transfers Transfers: Sit to Stand;Stand Pivot Transfers Sit to Stand: Maximal Assistance - Patient 25-49% Stand Pivot Transfers: Minimal Assistance - Patient > 75%(pulling up on wide RW from raised bed) Stand Pivot Transfer Details: Verbal cues for safe use of  DME/AE Transfer (Assistive device): Rolling walker Locomotion  Gait Ambulation: No Gait Gait: No Stairs / Additional Locomotion Stairs: No Wheelchair Mobility Wheelchair Mobility: No  Trunk/Postural Assessment  Cervical Assessment Cervical Assessment: Within Functional Limits Thoracic Assessment Thoracic Assessment: Exceptions to WFL(back precautions) Lumbar Assessment Lumbar Assessment: Exceptions to WFL(back precautions) Postural Control Postural Control: Within Functional Limits  Balance Balance Balance Assessed: Yes Static Sitting Balance Static Sitting - Level of Assistance: 5: Stand by assistance Dynamic Sitting Balance Dynamic Sitting - Level of Assistance: 5: Stand by assistance Static Standing Balance Static Standing - Level of Assistance: 4: Min assist Dynamic Standing Balance Dynamic Standing - Level of Assistance: Not tested (comment); min assist when ambulating with bil UE support on RW Extremity Assessment      RLE Assessment Pitting edema entire LE RLE Assessment: Exceptions to [facility] Passive Range of Motion (PROM) Comments: heel cords tight General Strength Comments: grossly in sitting: 2-/5 hip flexion, 4/5 knee extension and ankle DF,  LLE Assessment Pitting edema entire LE LLE Assessment: Exceptions to [facility] Passive Range of Motion (PROM) Comments: heel cords tight General Strength Comments: grossly in sitting: hip flexion 3-/5, hip adduction 4/5, knee extension 4/5, ankle DF 4+/5    Refer to Care Plan for Long Term Goals  Recommendations for other services: None   Discharge Criteria: Patient will be discharged from PT if patient refuses treatment 3 consecutive times without medical reason, if treatment goals not met, if there is a change in medical status, if patient makes no progress towards goals or if patient is discharged from hospital.  The above assessment, treatment plan, treatment alternatives and goals were discussed and mutually agreed  upon: by patient and by family  Kaoir Loree 07/01/2018, 5:03 PM

## 2018-07-02 ENCOUNTER — Inpatient Hospital Stay (HOSPITAL_COMMUNITY): Payer: Medicare Other

## 2018-07-02 ENCOUNTER — Inpatient Hospital Stay (HOSPITAL_COMMUNITY): Payer: Medicare Other | Admitting: Occupational Therapy

## 2018-07-02 ENCOUNTER — Inpatient Hospital Stay (HOSPITAL_COMMUNITY): Payer: Medicare Other | Admitting: Physical Therapy

## 2018-07-02 DIAGNOSIS — R0989 Other specified symptoms and signs involving the circulatory and respiratory systems: Secondary | ICD-10-CM

## 2018-07-02 DIAGNOSIS — G893 Neoplasm related pain (acute) (chronic): Secondary | ICD-10-CM

## 2018-07-02 LAB — BASIC METABOLIC PANEL
ANION GAP: 7 (ref 5–15)
BUN: 11 mg/dL (ref 8–23)
CO2: 23 mmol/L (ref 22–32)
Calcium: 7.1 mg/dL — ABNORMAL LOW (ref 8.9–10.3)
Chloride: 106 mmol/L (ref 98–111)
Creatinine, Ser: 0.74 mg/dL (ref 0.61–1.24)
GFR calc Af Amer: >60 mL/min (ref >60)
GFR calc non Af Amer: >60 mL/min (ref >60)
Glucose, Bld: 109 mg/dL — ABNORMAL HIGH (ref 70–99)
Potassium: 3.3 mmol/L — ABNORMAL LOW (ref 3.5–5.1)
Sodium: 136 mmol/L (ref 135–145)

## 2018-07-02 NOTE — Progress Notes (Addendum)
Daily Progress Note   Patient Name: Brian [name]       Date: 07/02/2018 DOB: 1946/02/13  Age: 73 y.o. MRN#: [id number] Attending Physician: Jamse Arn, MD Primary Care Physician: [name], MD Admit Date: 06/29/2018  Reason for Consultation/Follow-up: Establishing goals of care, pain management  Subjective: Arrived to scheduled [place] meeting. Wife and daughter present. [name] states his pain has been well controlled as staff has been diligent about assessing and treating pain. No changes to his regimen.  His wife has his packet from home with advanced directives/POA papers. The POA is completed to make her medical POA, but the living will is blank. He initially states he meant for her to just let him go if he could not make decisions. Upon attempting to clarify this statement, he states he thought he filled it out. Upon addressing questions regarding decisions in the living will, he asked if each intervention such as dialysis, feeding tube, and intubation would be temporary, and ultimately stated he was not sure or would have to give it some thought to each question. His wife became tearful and encouraged him to speak about his wishes as she stated she did not know what his desires would be and would want to honor his wishes. She also expressed concern that she would feel tremendous pressure if she and his daughter (her step daughter) did not agree about care if he were unable to make decisions.  They will discuss GOC as a family, and would like to reconvene first of next week. MOST form left for them at bedside.   He states he has been through so much. He tells me of the initial concerns for RA, and that his pain made it so that he could not walk. He spoke about being sent to  orthopedics and finding out he had cancer in the same time frame. He states he is fearful as he does not want to die, that he wants to be here for his family. He states he knows they would be okay without him if he were not here.   He and his family are very eager to know what options he has, and to know oncology's thoughts on prognostication.   We discussed quality of life. He states most important is pain management.    Length  of Stay: 3  Current Medications: Scheduled Meds:  . clobetasol ointment   Topical BID  . feeding supplement (PRO-STAT SUGAR FREE 64)  30 mL Oral BID  . fentaNYL  12.5 mcg Transdermal Q72H  . gabapentin  300 mg Oral TID  . guaiFENesin  10 mL Oral Q6H  . pantoprazole  40 mg Oral Daily  . potassium chloride  20 mEq Oral Daily  . senna  1 tablet Oral QHS    Continuous Infusions:   PRN Meds: acetaminophen, albuterol, alum & mag hydroxide-simeth, bisacodyl, camphor-menthol, diphenhydrAMINE, guaiFENesin-dextromethorphan, HYDROcodone-acetaminophen, HYDROmorphone HCl, methocarbamol, polyethylene glycol, prochlorperazine **OR** prochlorperazine **OR** prochlorperazine, sodium phosphate, traZODone  Physical Exam Pulmonary:     Effort: Pulmonary effort is normal.  Skin:    General: Skin is warm and dry.     Findings: Rash present.             Vital Signs: BP 105/74 (BP Location: Left Arm)   Pulse (!) 116   Temp (!) 97.5 F (36.4 C)   Resp 18   Ht 5\' 10"  (1.778 m)   Wt 96.8 kg   SpO2 91%   BMI 30.62 kg/m  SpO2: SpO2: 91 % O2 Device: O2 Device: Room Air O2 Flow Rate: O2 Flow Rate (L/min): 2 L/min  Intake/output summary:   Intake/Output Summary (Last 24 hours) at 07/02/2018 1121 Last data filed at 07/02/2018 0540 Gross per 24 hour  Intake 480 ml  Output 350 ml  Net 130 ml   LBM: Last BM Date: 06/30/18 Baseline Weight: Weight: 96.8 kg Most recent weight: Weight: 96.8 kg       Palliative Assessment/Data: 30%      Patient Active Problem List     Diagnosis Date Noted  . Labile blood pressure   . Cancer associated pain   . DVT of lower extremity (deep venous thrombosis) ([place])   . Malignant neoplasm of lung ([place])   . Hypoalbuminemia due to protein-calorie malnutrition ([place])   . Acute blood loss anemia   . Postoperative pain   . Pathological compression fracture of lumbar vertebra ([place]) 06/29/2018  . Metastatic lung cancer (metastasis from lung to other site) ([place]) 06/28/2018  . Rash 06/25/2018  . Hypokalemia 06/25/2018  . Normocytic anemia 06/25/2018  . S/P lumbar fusion 06/24/2018  . Palliative care by specialist   . Cancer related pain   . Lung cancer, primary, with metastasis from lung to other site, right ([place]) 06/14/2018  . Squamous cell lung cancer, right ([place]) 06/14/2018  . Lung cancer metastatic to bone ([place]) 06/14/2018  . Bronchitis due to tobacco use 06/07/2018  . Lumbar spine tumor 06/04/2018    Palliative Care Assessment & Plan   Recommendations/Plan:  Will continue to follow.  Recommend Fentanyl patch for sustained pain control.  Recommend Tylenol for mild pain. Recommend Norco for moderate pain. Recommend Dilaudid for severe pain.  Recommend Robaxin for muscle spasms. Recommend increasing Neurontin as needed for neuropathic pain.   Code Status:    Code Status Orders  (From admission, onward)         Start     Ordered   06/29/18 1526  Full code  Continuous     06/29/18 1525        Code Status History    Date Active Date Inactive Code Status Order ID Comments User Context   06/11/2018 2031 06/29/2018 1440 Full Code [id number]  Milagros Loll, MD Inpatient   06/11/2018 2011 06/11/2018 2030 Full Code [id number]  Melina Schools,  MD Inpatient   06/04/2018 1344 06/11/2018 2011 Full Code [id number]  Ardeen Jourdain, PA-C Inpatient       Prognosis:  Poor overall. Metastatic cancer with pathologic fracture diagnosed in December.   Discharge Planning:  To Be Determined  Care plan was  discussed with RN  Thank you for allowing the Palliative Medicine Team to assist in the care of this patient.   Time In: 9:20 Time Out: 10:30 Total Time 70 min Prolonged Time Billed  yes      Greater than 50%  of this time was spent counseling and coordinating care related to the above assessment and plan.  Asencion Gowda, NP  Please contact Palliative Medicine Team phone at [phone] for questions and concerns.

## 2018-07-02 NOTE — Progress Notes (Signed)
Brian [name] PHYSICAL MEDICINE & REHABILITATION PROGRESS NOTE  Subjective/Complaints: Patient seen laying in bed this morning.  He states he slept well overnight.  Family at bedside.  He states that a good first day of therapies except symptoms of orthostatic hypotension, stating nearly "passing out".  ROS: Denies chest pain, shortness of breath, nausea, vomiting, diarrhea.  Objective: Vital Signs: Blood pressure 125/72, pulse 99, temperature (!) 97.5 F (36.4 C), resp. rate 18, height 5\' 10"  (1.778 m), weight 96.8 kg, SpO2 91 %. Ir Ivc Filter Plmt / S&i /img Guid/mod Sed  Result Date: 06/30/2018 INDICATION: 73 year old with metastatic lung cancer. Recent lumbar spine surgery and history of retroperitoneal hematoma. Patient now has a DVT in the left common femoral vein and not a candidate for anticoagulation. EXAM: IVC FILTER PLACEMENT; IVC VENOGRAM; ULTRASOUND FOR VASCULAR ACCESS Physician: Stephan Minister. Brian Pancoast, MD MEDICATIONS: None. ANESTHESIA/SEDATION: Fentanyl 50 mcg IV; Versed 2.0 mg IV Moderate Sedation Time:  21 minutes The patient was continuously monitored during the procedure by the interventional radiology nurse under my direct supervision. CONTRAST:  70 mL Isovue-300 FLUOROSCOPY TIME:  Fluoroscopy Time: 2 minutes 36 seconds (90 mGy). COMPLICATIONS: None immediate. PROCEDURE: The procedure was explained to the patient. The risks and benefits of the procedure were discussed and the patient's questions were addressed. Informed consent was obtained from the patient. Ultrasound demonstrated a patent right internal jugular vein. Ultrasound images were obtained for documentation. The right side of the neck was prepped and draped in a sterile fashion. Maximal barrier sterile technique was utilized including caps, mask, sterile gowns, sterile gloves, sterile drape, hand hygiene and skin antiseptic. The skin was anesthetized with 1% lidocaine. A 21 gauge needle was directed into the vein with ultrasound  guidance and a micropuncture dilator set was placed. A wire was advanced into the IVC. The filter sheath was advanced over the wire into the IVC. An IVC venogram was performed. Fluoroscopic images were obtained for documentation. A Bard Denali filter was deployed below the lowest renal vein. A follow-up venogram was performed and the vascular sheath was removed with manual compression. FINDINGS: IVC was patent. Bilateral renal veins were identified. Narrowing in the distal IVC secondary to external compression from the adjacent abdominal aortic aneurysm. The filter was placed below the renal veins and just above the IVC external compression. IMPRESSION: Successful placement of a retrievable IVC filter. PLAN: This IVC filter is potentially retrievable. The patient will be assessed for filter retrieval by Interventional Radiology in approximately 8-12 weeks. Further recommendations regarding filter retrieval, continued surveillance or declaration of device permanence, will be made at that time. Electronically Signed   By: Brian [name] M.D.   On: 06/30/2018 17:51   Recent Labs    06/30/18 0619  WBC 10.3  HGB 9.3*  HCT 29.6*  PLT 373   Recent Labs    06/30/18 0619 07/02/18 0731  NA 135 136  K 3.0* 3.3*  CL 106 106  CO2 22 23  GLUCOSE 115* 109*  BUN 7* 11  CREATININE 0.65 0.74  CALCIUM 7.2* 7.1*    Physical Exam: BP 125/72 (BP Location: Left Arm)   Pulse 99   Temp (!) 97.5 F (36.4 C)   Resp 18   Ht 5\' 10"  (1.778 m)   Wt 96.8 kg   SpO2 91%   BMI 30.62 kg/m  Constitutional: No distress . Vital signs reviewed. HENT: Normocephalic.  Atraumatic. Eyes: EOMI. No discharge. Cardiovascular: RRR.  No JVD. Respiratory: CTA bilaterally.  Normal effort. GI:  BS +. Non-distended. Musculoskeletal: Bilateral lower extremity edema, left greater than right Neurological: He is alert and oriented  Dysphonia  Able to follow basic commands without difficulty.  Motor: Bilateral UE 5/5 proximal to  distal Right lower extremity: 4-4+/5 proximal distal, unchanged Left lower extremity: Hip flexion 3/5, knee extension 4-/5, ankle dorsiflexion 4/5 unchanged Skin: Rash improving Psychiatric: His behavior is normal.Judgmentand thought contentnormal. His affect is blunt.  Assessment/Plan: 1. Functional deficits secondary to lung cancer with mets to ribs, vertebrae with oncological fracture status post lumbar fusion which require 3+ hours per day of interdisciplinary therapy in a comprehensive inpatient rehab setting.  Physiatrist is providing close team supervision and 24 hour management of active medical problems listed below.  Physiatrist and rehab team continue to assess barriers to discharge/monitor patient progress toward functional and medical goals  Care Tool:  Bathing  Bathing activity did not occur: Safety/medical concerns(Unable to assess 2/2 drop in BP with upright positioning)           Bathing assist       Upper Body Dressing/Undressing Upper body dressing        Upper body assist      Lower Body Dressing/Undressing Lower body dressing      What is the patient wearing?: Pants     Lower body assist Assist for lower body dressing: Total Assistance - Patient < 25%(Bed level)     Toileting Toileting Toileting Activity did not occur (Clothing management and hygiene only): Safety/medical concerns(Unable to assess 2/2 drop in BP with upright positioning)  Toileting assist Assist for toileting: Moderate Assistance - Patient 50 - 74%     Transfers Chair/bed transfer  Transfers assist     Chair/bed transfer assist level: 2 Helpers(Using STEDY)     Locomotion Ambulation   Ambulation assist   Ambulation activity did not occur: Safety/medical concerns(hypotensive)  Assist level: Minimal Assistance - Patient > 75% Assistive device: Walker-rolling Max distance: 12   Walk 10 feet activity   Assist     Assist level: Minimal Assistance - Patient  > 75% Assistive device: Walker-rolling   Walk 50 feet activity   Assist           Walk 150 feet activity   Assist           Walk 10 feet on uneven surface  activity   Assist           Wheelchair     Assist Will patient use wheelchair at discharge?: Yes Type of Wheelchair: Manual Wheelchair activity did not occur: Safety/medical concerns(hypotensive)  Wheelchair assist level: Supervision/Verbal cueing Max wheelchair distance: 100    Wheelchair 50 feet with 2 turns activity    Assist        Assist Level: Supervision/Verbal cueing   Wheelchair 150 feet activity     Assist Wheelchair 150 feet activity did not occur: Safety/medical concerns(fatigue)          Medical Problem List and Plan: 1.Functional and mobility deficitssecondary to metastatic lung cancer to left clavicle, ribs, L4 vertebral body with fracture. Pt is s/p L4 corpectomy and L3-5 fusion by Dr. Rolena Infante on 06/11/18  Continue CIR 2. DVT: dopplers done this evening and patient has DVT in left CFV             IVC filter placement on 1/8  Pt not a candidate for anticoagulation due to hematoma and bleeding riskat present although long term he is high VTE risk 3. Pain Management:Continue Fentanyl 12.5 mcg  with norco/dilaudid prn. Unable to tolerate Oxycodone. Monitor patient's pain with increased activity. 4. Mood:LCSW to follow for evaluation and support. 5. Neuropsych: This patientiscapable of making decisions on hisown behalf. 6. Skin/Wound Care:Monitor wound for healing. Has been refusing ensure--Added prostat to help promote wound healing. 7. Fluids/Electrolytes/Nutrition:Monitor I/O.  8.Blood pressure; Intermittent elevation noted   Orthostatic vital signs ordered on 1/10  Abdominal binder as needed  Monitor with increased mobility 9. ABLA: Continue to monitor with serial checks.   Hemoglobin 9.3 on 1/8 10. Hypokalemia:   Potassium 3.3 on 1/10   Labs ordered  for Monday   Continue supplement   Magnesium 1.7 on 1/8 11. Rash: Continue Clobetasol bidas well as hypoallergenic sheets. 12. Neuropathic pain: Improving on gabapentin 300 mg tid 13. Constipation:Managed with miralax prn--patient wants to manage his bowel program. 14.  Hypoalbuminemia  Supplement initiated on 1/8   LOS: 3 days A FACE TO FACE EVALUATION WAS PERFORMED  Ankit [name] 07/02/2018, 9:02 AM

## 2018-07-02 NOTE — Plan of Care (Signed)
  Problem: RH PAIN MANAGEMENT Goal: RH STG PAIN MANAGED AT OR BELOW PT'S PAIN GOAL Description Less than 3,on 1 to 10 scale.  Outcome: Progressing  Assessing pain every shift and as needed, administered pain med as needed.

## 2018-07-02 NOTE — Progress Notes (Signed)
Occupational Therapy Session Note  Patient Details  Name: Brian [name] MRN: [id number] Date of Birth: 1945-11-20  Today's Date: 07/02/2018 OT Individual Time: 1100-1200 OT Individual Time Calculation (min): 60 min    Short Term Goals: Week 1:  OT Short Term Goal 1 (Week 1): Pt will complete transfer to toillet/BSC with mod A using LRAD OT Short Term Goal 2 (Week 1): Pt will thread B LEs into pants with steadying assisting using AE PRN OT Short Term Goal 3 (Week 1): Pt will tolerate standing at sink to complete 1 grooming task in standing with steadying assist  Skilled Therapeutic Interventions/Progress Updates:    Pt seen for OT ADL bathing/dressing session. Pt sitting upright in w/c upon arrival with RN and family present. Pt reporting pain as "mild" in lower back, reports already having been medicated and denying intervention. He completed UB bathing/dressing and grooming tasks from w/c level at sink with assist for set-up and donning/doffing orthosis.  He was able to name 2/3 back pre-cautions this session and required min cuing to implement during functional tasks.  Education, demonstration and return demonstration provided regarding AE for dressing. Pt able to recall use of reacher and sock aid from introduction on acute care unit. He was able to don/doff socks with reacher and sock aid with min cuing. Pt requesting return to bed at end of session, mod-max A for sit>stand at RW and min A stand pivot to return to EOB. Mod A for management of B LEs onto bed with cuing for log rolling technique. Pt left in supine at end of session, positioned for comfort, all needs in reach and family present.   Therapy Documentation Precautions:  Precautions Precautions: Back Precaution Comments: re-educated on BLT, pt cont to be reminded during function Required Braces or Orthoses: Spinal Brace Spinal Brace: Thoracolumbosacral orthotic, Applied in sitting position(Able to doff brace when  sitting in chair and for showering) Restrictions Weight Bearing Restrictions: No   Therapy/Group: Individual Therapy  Hajira Verhagen L 07/02/2018, 7:07 AM

## 2018-07-02 NOTE — Progress Notes (Signed)
Occupational Therapy Session Note  Patient Details  Name: Brian [name] MRN: [id number] Date of Birth: 10-Sep-1945  Today's Date: 07/02/2018 OT Individual Time: 4944-9675 OT Individual Time Calculation (min): 54 min    Short Term Goals: Week 1:  OT Short Term Goal 1 (Week 1): Pt will complete transfer to toillet/BSC with mod A using LRAD OT Short Term Goal 2 (Week 1): Pt will thread B LEs into pants with steadying assisting using AE PRN OT Short Term Goal 3 (Week 1): Pt will tolerate standing at sink to complete 1 grooming task in standing with steadying assist  Skilled Therapeutic Interventions/Progress Updates:    Pt in bed to start session with family present and nursing in the room as well to administer pain meds.  Pt able to initially state only 1/3 back precautions when asked.  He was able to transfer from supine to sitting EOB with mod assist.  Once in sitting, therapist helped him donn the abdominal binder and BP was taken at 120/62.  Therapist assisted with donning TLSO in sitting.  He needed total assist to complete.  He then performed transfer stand pivot from the bed to the wheelchair with mod assist.  Therapist then transported pt down to the therapy gym via wheelchair with pt's spouse and daughter present.  He was able to transfer to the therapy mat from the wheelchair with min assist sit to stand from the arms of the wheelchair.  Pt worked on sit to stand transitions from the elevated mat, working on lowering it a small amount each time.  He completed multiple sit to stands with overall mod assist.  While standing he worked on functional reaching to pick up and place clothespins with each UE with min guard assist.  Therapist placed clothespins slightly behind him with each UE to simulate clothing management for toileting or dressing tasks.  Next, had pt complete functional mobility from the gym back toward his room.  He was able to ambulate with min assist for approximately 55 ft  before needing to rest.  Therapist assisted with transition back to the room via wheelchair with transfer back to the bed with mod assist.  He was able to remove the TLSO with mod assist and then transition to supine at the same level.  Pt left with family present with call button and phone in reach and bed alarm in place.    Therapy Documentation Precautions:  Precautions Precautions: Back Precaution Comments: re-educated on BLT, pt cont to be reminded during function Required Braces or Orthoses: Spinal Brace Spinal Brace: Thoracolumbosacral orthotic, Applied in sitting position Restrictions Weight Bearing Restrictions: No  Pain: Pain Assessment Pain Scale: 0-10 Pain Score: 4  Faces Pain Scale: Hurts little more Pain Type: Surgical pain Pain Location: Back Pain Orientation: Lower Pain Descriptors / Indicators: Discomfort Pain Onset: With Activity Pain Intervention(s): Repositioned  Therapy/Group: Individual Therapy  Doryce Mcgregory OTR/L 07/02/2018, 4:20 PM

## 2018-07-02 NOTE — Progress Notes (Addendum)
Physical Therapy Session Note  Patient Details  Name: Brian [name] MRN: [id number] Date of Birth: 1945/08/29  Today's Date: 07/02/2018 PT Individual Time: 0998-3382 PT Individual Time Calculation (min): 37 min   Short Term Goals: Week 1:  PT Short Term Goal 1 (Week 1): pt will move supine> sit with supervision PT Short Term Goal 2 (Week 1): pt will perform basic transfer with +1 consistently PT Short Term Goal 3 (Week 1): pt will propel w/c x 100' PT Short Term Goal 4 (Week 1): pt will perform gait with LRAD x 35' with min assist PT Short Term Goal 5 (Week 1): pt will demonstrate knowledge of back precautions when moving in/out of bed wiht 0-1 cue  Skilled Therapeutic Interventions/Progress Updates:   Pt in supine and agreeable to therapy, pain as detailed below. TEDs and aces already donned, transferred to EOB w/ min assist, pt able to adhere to back precautions w/ minimal reminders from therapist. Total assist to don abdominal binder and TLSO at EOB. Stand pivot transfer to w/c w/ min assist using RW. Pt self-propelled w/c to therapy gym w/ supervision using BUEs to work on global endurance. Performed LE strengthening exercises from seated level including knee marches 2x10, LAQs 2x10, heel slides 2x10, heel raises 2x10, and toe raises 2x10. Pt w/ increasing discomfort in chair as session went on. States pain still at 4-5/10, however noticeably increased pain based on body language (8/10 on FACES scale). Returned to room total assist in w/c, ended session in w/c and in care of family, all needs met. Pt agreeable to stay in w/c until after he finished eating dinner to work on OOB tolerance. RN made aware of pain and pt requesting stronger medication for pain. Missed 8 min of skilled PT 2/2 pain and inability to tolerate further therapeutic intervention this evening.   Addendum:  Pt noted to have stayed up in w/c 20 min after end of session before needing to return to bed 2/2 pain level.    Therapy Documentation Precautions:  Precautions Precautions: Back Precaution Comments: re-educated on BLT, pt cont to be reminded during function Required Braces or Orthoses: Spinal Brace Spinal Brace: Thoracolumbosacral orthotic, Applied in sitting position Restrictions Weight Bearing Restrictions: No Vital Signs: Therapy Vitals Pulse Rate: 95 Resp: 16 BP: 124/75 Patient Position (if appropriate): Lying Oxygen Therapy SpO2: 94 % O2 Device: Room Air Pain: Pain Assessment Pain Scale: 0-10 Pain Score: 4  Pain Type: Surgical pain Pain Location: Back Pain Orientation: Lower Pain Descriptors / Indicators: Discomfort Pain Onset: On-going Pain Intervention(s): Repositioned  Therapy/Group: Individual Therapy  Brian [name] K Brian [name] 07/02/2018, 5:10 PM

## 2018-07-02 NOTE — Progress Notes (Signed)
Physical Therapy Session Note  Patient Details  Name: Brian [name] MRN: [id number] Date of Birth: Feb 20, 1946  Today's Date: 07/02/2018 PT Individual Time: 1030-1100 PT Individual Time Calculation (min): 30 min   Short Term Goals: Week 1:  PT Short Term Goal 1 (Week 1): pt will move supine> sit with supervision PT Short Term Goal 2 (Week 1): pt will perform basic transfer with +1 consistently PT Short Term Goal 3 (Week 1): pt will propel w/c x 100' PT Short Term Goal 4 (Week 1): pt will perform gait with LRAD x 35' with min assist PT Short Term Goal 5 (Week 1): pt will demonstrate knowledge of back precautions when moving in/out of bed wiht 0-1 cue  Skilled Therapeutic Interventions/Progress Updates:    Patient in supine with family present, missed 15 minutes due to meeting with palliative care practitioner.  Donned TED's and ace wraps in supine.  Rolled with min A for LE management with rail, then side to sit assist for trunk and legs.  Patient seated EOB for donning TLSO with total A.  Sitting balance with close S with UE support. Sit to stand from elevated height mod A and stand step to w/c mod A with RW.  In w/c assist to bathroom.  Obtained 3:1 To place over commode.  Transfer to 3:1 with grabbbar and mod A.  Toileted with max A for clothing management and hygiene. Assist back to w/c with RW and mod A.  Patient propelled w/c in room with S and cues for safety.  Left up in w/c for OT session.  Asked NT to take BP as pt reported some light headedness.  Wife and daughter in the room.   Therapy Documentation Precautions:  Precautions Precautions: Back Precaution Comments: re-educated on BLT, pt cont to be reminded during function Required Braces or Orthoses: Spinal Brace Spinal Brace: Thoracolumbosacral orthotic, Applied in sitting position Restrictions Weight Bearing Restrictions: No General: PT Amount of Missed Time (min): 15 Minutes PT Missed Treatment Reason: Other  (Comment)(meeting with palliative care) Pain: Pain Assessment Pain Score: 2  Faces Pain Scale: Hurts little more Pain Type: Acute pain Pain Location: Back Pain Orientation: Mid Pain Descriptors / Indicators: Aching Pain Onset: With Activity Pain Intervention(s): Repositioned;Ambulation/increased activity    Therapy/Group: Individual Therapy  Reginia Naas  [place], [place] 07/02/2018  07/02/2018, 12:44 PM

## 2018-07-03 DIAGNOSIS — I951 Orthostatic hypotension: Secondary | ICD-10-CM

## 2018-07-03 NOTE — Progress Notes (Signed)
[place] PHYSICAL MEDICINE & REHABILITATION PROGRESS NOTE  Subjective/Complaints: Patient seen laying in bed this morning.  He states he slept well overnight.  Family at bedside.  He states he had some increased pain due to exertion in therapies yesterday.  ROS: Denies chest pain, shortness of breath, nausea, vomiting, diarrhea.  Objective: Vital Signs: Blood pressure 127/73, pulse 100, temperature 97.9 F (36.6 C), temperature source Oral, resp. rate 15, height 5\' 10"  (1.778 m), weight 92.1 kg, SpO2 93 %. No results found. No results for input(s): WBC, HGB, HCT, PLT in the last 72 hours. Recent Labs    07/02/18 0731  NA 136  K 3.3*  CL 106  CO2 23  GLUCOSE 109*  BUN 11  CREATININE 0.74  CALCIUM 7.1*    Physical Exam: BP 127/73 (BP Location: Right Arm)   Pulse 100   Temp 97.9 F (36.6 C) (Oral)   Resp 15   Ht 5\' 10"  (1.778 m)   Wt 92.1 kg   SpO2 93%   BMI 29.13 kg/m  Constitutional: No distress . Vital signs reviewed. HENT: Normocephalic.  Atraumatic. Eyes: EOMI. No discharge. Cardiovascular: RRR.  No JVD. Respiratory: CTA bilaterally.  Normal effort. GI: BS +. Non-distended. Musculoskeletal: Bilateral lower extremity edema, left greater than right, stable Neurological: He is alert and oriented  Dysphonia  Able to follow basic commands without difficulty.  Motor: Bilateral UE 5/5 proximal to distal Right lower extremity: 4/5 proximal distal, unchanged Left lower extremity: Hip flexion 3-/5, knee extension 4-/5, ankle dorsiflexion 4/5 unchanged Skin: Rash improving Psychiatric: His behavior is normal.Judgmentand thought contentnormal. His affect is blunt.  Assessment/Plan: 1. Functional deficits secondary to lung cancer with mets to ribs, vertebrae with oncological fracture status post lumbar fusion which require 3+ hours per day of interdisciplinary therapy in a comprehensive inpatient rehab setting.  Physiatrist is providing close team supervision  and 24 hour management of active medical problems listed below.  Physiatrist and rehab team continue to assess barriers to discharge/monitor patient progress toward functional and medical goals  Care Tool:  Bathing  Bathing activity did not occur: Safety/medical concerns(Unable to assess 2/2 drop in BP with upright positioning) Body parts bathed by patient: Right arm, Left arm, Chest, Face, Abdomen   Body parts bathed by helper: Front perineal area, Buttocks, Right upper leg, Left upper leg, Right lower leg, Left lower leg     Bathing assist Assist Level: Moderate Assistance - Patient 50 - 74%     Upper Body Dressing/Undressing Upper body dressing Upper body dressing/undressing activity did not occur (including orthotics): Safety/medical concerns(B/P issues ) What is the patient wearing?: Pull over shirt, Orthosis Orthosis activity level: Performed by helper  Upper body assist Assist Level: Supervision/Verbal cueing    Lower Body Dressing/Undressing Lower body dressing      What is the patient wearing?: Pants     Lower body assist Assist for lower body dressing: Total Assistance - Patient < 25%(Bed level)     Toileting Toileting Toileting Activity did not occur Landscape architect and hygiene only): Safety/medical concerns(Unable to assess 2/2 drop in BP with upright positioning)  Toileting assist Assist for toileting: Maximal Assistance - Patient 25 - 49%     Transfers Chair/bed transfer  Transfers assist     Chair/bed transfer assist level: Minimal Assistance - Patient > 75%     Locomotion Ambulation   Ambulation assist   Ambulation activity did not occur: Safety/medical concerns  Assist level: Minimal Assistance - Patient > 75% Assistive  device: Walker-rolling Max distance: 55'   Walk 10 feet activity   Assist     Assist level: Minimal Assistance - Patient > 75% Assistive device: Walker-rolling   Walk 50 feet activity   Assist            Walk 150 feet activity   Assist           Walk 10 feet on uneven surface  activity   Assist           Wheelchair     Assist Will patient use wheelchair at discharge?: Yes Type of Wheelchair: Manual Wheelchair activity did not occur: Safety/medical concerns(hypotensive)  Wheelchair assist level: Supervision/Verbal cueing Max wheelchair distance: 150'    Wheelchair 50 feet with 2 turns activity    Assist    Wheelchair 50 feet with 2 turns activity did not occur: Safety/medical concerns   Assist Level: Supervision/Verbal cueing   Wheelchair 150 feet activity     Assist Wheelchair 150 feet activity did not occur: Safety/medical concerns   Assist Level: Supervision/Verbal cueing      Medical Problem List and Plan: 1.Functional and mobility deficitssecondary to metastatic lung cancer to left clavicle, ribs, L4 vertebral body with fracture. Pt is s/p L4 corpectomy and L3-5 fusion by Dr. Rolena Infante on 06/11/18  Continue CIR 2. DVT: dopplers done this evening and patient has DVT in left CFV             IVC filter placement on 1/8  Pt not a candidate for anticoagulation due to hematoma and bleeding riskat present although long term he is high VTE risk 3. Pain Management:Continue Fentanyl 12.5 mcg with norco/dilaudid prn. Unable to tolerate Oxycodone. Monitor patient's pain with increased activity.  Will continue to monitor and consider further adjustments if necessary 4. Mood:LCSW to follow for evaluation and support. 5. Neuropsych: This patientiscapable of making decisions on hisown behalf. 6. Skin/Wound Care:Monitor wound for healing. Has been refusing ensure--Added prostat to help promote wound healing. 7. Fluids/Electrolytes/Nutrition:Monitor I/O.  8.Blood pressure; Intermittent elevation noted   Orthostatic vital signs ordered on 1/10, pending  Abdominal binder as needed  Monitor with increased mobility 9. ABLA: Continue to monitor  with serial checks.   Hemoglobin 9.3 on 1/8 10. Hypokalemia:   Potassium 3.3 on 1/10   Labs ordered for Monday   Continue supplement   Magnesium 1.7 on 1/8 11. Rash: Continue Clobetasol bidas well as hypoallergenic sheets. 12. Neuropathic pain: Improving on gabapentin 300 mg tid 13. Constipation:Managed with miralax prn--patient wants to manage his bowel program. 14.  Hypoalbuminemia  Supplement initiated on 1/8   LOS: 4 days A FACE TO FACE EVALUATION WAS PERFORMED  Sherise Geerdes [name] 07/03/2018, 1:17 PM

## 2018-07-03 NOTE — Plan of Care (Signed)
  Problem: Consults Goal: RH SPINAL CORD INJURY PATIENT EDUCATION Description  See Patient Education module for education specifics.  Outcome: Progressing Goal: Skin Care Protocol Initiated - if Braden Score 18 or less Description If consults are not indicated, leave blank or document N/A Outcome: Progressing   Problem: SCI BOWEL ELIMINATION Goal: RH STG MANAGE BOWEL WITH ASSISTANCE Description STG Manage Bowel with Mod.Assistance.  Outcome: Progressing Goal: RH STG SCI MANAGE BOWEL WITH MEDICATION WITH ASSISTANCE Description STG SCI Manage bowel with medication with Mod.assistance.  Outcome: Progressing Goal: RH STG MANAGE BOWEL W/EQUIPMENT W/ASSISTANCE Description STG Manage Bowel With Equipment With mod. Assistance  Outcome: Progressing Goal: RH STG SCI MANAGE BOWEL PROGRAM W/ASSIST OR AS APPROPRIATE Description STG SCI Manage bowel program w/mod.assist or as appropriate.  Outcome: Progressing   Problem: SCI BLADDER ELIMINATION Goal: RH STG MANAGE BLADDER WITH ASSISTANCE Description STG Manage Bladder With mod.Assistance  Outcome: Progressing   Problem: RH SKIN INTEGRITY Goal: RH STG SKIN FREE OF INFECTION/BREAKDOWN Description With mod. Assist.  Outcome: Progressing Goal: RH STG MAINTAIN SKIN INTEGRITY WITH ASSISTANCE Description STG Maintain Skin Integrity With Mod.Assistance.  Outcome: Progressing   Problem: RH SAFETY Goal: RH STG ADHERE TO SAFETY PRECAUTIONS W/ASSISTANCE/DEVICE Description STG Adhere to Safety Precautions With Mod.Assistance/Device.  Outcome: Progressing   Problem: RH PAIN MANAGEMENT Goal: RH STG PAIN MANAGED AT OR BELOW PT'S PAIN GOAL Description Less than 3,on 1 to 10 scale.  Outcome: Progressing

## 2018-07-04 ENCOUNTER — Inpatient Hospital Stay (HOSPITAL_COMMUNITY): Payer: Medicare Other

## 2018-07-04 ENCOUNTER — Inpatient Hospital Stay (HOSPITAL_COMMUNITY): Payer: Medicare Other | Admitting: Physical Therapy

## 2018-07-04 NOTE — Progress Notes (Signed)
Pharmacy was call on regard the warning note from the pysis about the amount of acetaminophen that the pt. have used on the past 24 hours.Recommendations to give 325 mg. To 500 mg the max of tylenol were given.Keep monitoring pt. Closely.

## 2018-07-04 NOTE — Progress Notes (Addendum)
[place] PHYSICAL MEDICINE & REHABILITATION PROGRESS NOTE  Subjective/Complaints: Patient seen laying in bed this AM.  He states he slept well overnight. Cousin at bedside.    ROS: Denies chest pain, shortness of breath, nausea, vomiting, diarrhea.  Objective: Vital Signs: Blood pressure 115/75, pulse (!) 108, temperature (!) 97.3 F (36.3 C), resp. rate 16, height 5\' 10"  (1.778 m), weight 92.1 kg, SpO2 95 %. No results found. No results for input(s): WBC, HGB, HCT, PLT in the last 72 hours. Recent Labs    07/02/18 0731  NA 136  K 3.3*  CL 106  CO2 23  GLUCOSE 109*  BUN 11  CREATININE 0.74  CALCIUM 7.1*    Physical Exam: BP 115/75   Pulse (!) 108   Temp (!) 97.3 F (36.3 C)   Resp 16   Ht 5\' 10"  (1.778 m)   Wt 92.1 kg   SpO2 95%   BMI 29.13 kg/m  Constitutional: No distress . Vital signs reviewed. HENT: Normocephalic.  Atraumatic. Eyes: EOMI. No discharge. Cardiovascular: RRR. No JVD. Respiratory: CTA bilaterally. Normal effort. GI: BS +. Non-distended. Musculoskeletal: Bilateral lower extremity edema, left greater than right, stable Neurological: He is alert and oriented  Dysphonia  Able to follow basic commands without difficulty.  Motor: Bilateral UE 5/5 proximal to distal Right lower extremity: 4/5 proximal distal, stable Left lower extremity: Hip flexion 3-/5, knee extension 4-/5, ankle dorsiflexion 4/5, improving Skin: Rash improving Psychiatric: His behavior is normal.Judgmentand thought contentnormal. His affect is blunt.  Assessment/Plan: 1. Functional deficits secondary to lung cancer with mets to ribs, vertebrae with oncological fracture status post lumbar fusion which require 3+ hours per day of interdisciplinary therapy in a comprehensive inpatient rehab setting.  Physiatrist is providing close team supervision and 24 hour management of active medical problems listed below.  Physiatrist and rehab team continue to assess barriers to  discharge/monitor patient progress toward functional and medical goals  Care Tool:  Bathing  Bathing activity did not occur: Safety/medical concerns(Unable to assess 2/2 drop in BP with upright positioning) Body parts bathed by patient: Right arm, Left arm, Chest, Face, Abdomen, Front perineal area, Buttocks, Right upper leg, Left upper leg   Body parts bathed by helper: Right lower leg, Left lower leg     Bathing assist Assist Level: Minimal Assistance - Patient > 75%     Upper Body Dressing/Undressing Upper body dressing Upper body dressing/undressing activity did not occur (including orthotics): Safety/medical concerns(B/P issues ) What is the patient wearing?: Pull over shirt, Orthosis Orthosis activity level: Performed by helper  Upper body assist Assist Level: Supervision/Verbal cueing    Lower Body Dressing/Undressing Lower body dressing      What is the patient wearing?: Pants     Lower body assist Assist for lower body dressing: Moderate Assistance - Patient 50 - 74%     Toileting Toileting Toileting Activity did not occur Landscape architect and hygiene only): Safety/medical concerns(Unable to assess 2/2 drop in BP with upright positioning)  Toileting assist Assist for toileting: Maximal Assistance - Patient 25 - 49%     Transfers Chair/bed transfer  Transfers assist     Chair/bed transfer assist level: Moderate Assistance - Patient 50 - 74%     Locomotion Ambulation   Ambulation assist   Ambulation activity did not occur: Safety/medical concerns  Assist level: Minimal Assistance - Patient > 75% Assistive device: Walker-rolling Max distance: 55'   Walk 10 feet activity   Assist     Assist  level: Minimal Assistance - Patient > 75% Assistive device: Walker-rolling   Walk 50 feet activity   Assist           Walk 150 feet activity   Assist           Walk 10 feet on uneven surface  activity   Assist            Wheelchair     Assist Will patient use wheelchair at discharge?: Yes Type of Wheelchair: Manual Wheelchair activity did not occur: Safety/medical concerns(hypotensive)  Wheelchair assist level: Supervision/Verbal cueing Max wheelchair distance: 150'    Wheelchair 50 feet with 2 turns activity    Assist    Wheelchair 50 feet with 2 turns activity did not occur: Safety/medical concerns   Assist Level: Supervision/Verbal cueing   Wheelchair 150 feet activity     Assist Wheelchair 150 feet activity did not occur: Safety/medical concerns   Assist Level: Supervision/Verbal cueing      Medical Problem List and Plan: 1.Functional and mobility deficitssecondary to metastatic lung cancer to left clavicle, ribs, L4 vertebral body with fracture. Pt is s/p L4 corpectomy and L3-5 fusion by Dr. Rolena Infante on 06/11/18  Continue CIR 2. DVT: dopplers done this evening and patient has DVT in left CFV             IVC filter placement on 1/8  Pt not a candidate for anticoagulation due to hematoma and bleeding riskat present although long term he is high VTE risk 3. Pain Management:Continue Fentanyl 12.5 mcg with norco/dilaudid prn. Unable to tolerate Oxycodone. Monitor patient's pain with increased activity.  Will continue to monitor and consider further adjustments if necessary 4. Mood:LCSW to follow for evaluation and support. 5. Neuropsych: This patientiscapable of making decisions on hisown behalf. 6. Skin/Wound Care:Monitor wound for healing. Has been refusing ensure--Added prostat to help promote wound healing. 7. Fluids/Electrolytes/Nutrition:Monitor I/O.  8.Blood pressure; Intermittent elevation noted   Orthostatic vital signs ordered on 1/10, remains pending on 1/12  Abdominal binder as needed  Monitor with increased mobility 9. ABLA: Continue to monitor with serial checks.   Hemoglobin 9.3 on 1/8 10. Hypokalemia:   Potassium 3.3 on 1/10   Labs ordered for  tomorrow   Continue supplement   Magnesium 1.7 on 1/8 11. Rash: Continue Clobetasol bidas well as hypoallergenic sheets. 12. Neuropathic pain: Improving on gabapentin 300 mg tid 13. Constipation:Managed with miralax prn--patient wants to manage his bowel program. 14.  Hypoalbuminemia  Supplement initiated on 1/8   LOS: 5 days A FACE TO FACE EVALUATION WAS PERFORMED  Dwain Huhn [name] 07/04/2018, 5:07 PM

## 2018-07-04 NOTE — Progress Notes (Signed)
Physical Therapy Session Note  Patient Details  Name: Brian [name] MRN: [id number] Date of Birth: Apr 30, 1946  Today's Date: 07/04/2018 PT Individual Time: 6811-5726 PT Individual Time Calculation (min): 71 min   Short Term Goals: Week 1:  PT Short Term Goal 1 (Week 1): pt will move supine> sit with supervision PT Short Term Goal 2 (Week 1): pt will perform basic transfer with +1 consistently PT Short Term Goal 3 (Week 1): pt will propel w/c x 100' PT Short Term Goal 4 (Week 1): pt will perform gait with LRAD x 35' with min assist PT Short Term Goal 5 (Week 1): pt will demonstrate knowledge of back precautions when moving in/out of bed wiht 0-1 cue  Skilled Therapeutic Interventions/Progress Updates:  Pt was seen bedside in the pm. Pt transferred supine to edge of bed with min A and verbal cues. Donned abd binder and TLSO at edge of bed. Pt transferred sit to stand with rolling walker and min A with verbal cues from elevated surface height. Pt transferred to w/c with min A and rolling walker with verbal cues. Pt transported to gym. Treatment in gym focused on sit to stand transfers with rolling walker and verbal cues. Pt's transfers fluctuated from min to mod A as patient fatigue. While standing pt performed marching 3 sets x 5 reps each. Pt performed hip flex and LAQs seated in w/c 3 sets x 10 reps each. Pt returned to room and left sitting up in w/c with call bell within reach and wife at bedside.   Therapy Documentation Precautions:  Precautions Precautions: Back Precaution Comments: re-educated on BLT, pt cont to be reminded during function Required Braces or Orthoses: Spinal Brace Spinal Brace: Thoracolumbosacral orthotic, Applied in sitting position Restrictions Weight Bearing Restrictions: No General:   Vitals: BP 119/70 HR: 117  Pain: Pt c/o mod back pain with mobility.    Therapy/Group: Individual Therapy  Dub Amis 07/04/2018, 3:22 PM

## 2018-07-04 NOTE — Progress Notes (Addendum)
Occupational Therapy Session Note  Patient Details  Name: Brian [name] MRN: [id number] Date of Birth: [birth year]  Today's Date: 07/04/2018 OT Individual Time: 1000-1100 Session 2: 1530-1630 OT Individual Time Calculation (min): 60 min Session 2: 60 min   Short Term Goals: Week 1:  OT Short Term Goal 1 (Week 1): Pt will complete transfer to toillet/BSC with mod A using LRAD OT Short Term Goal 2 (Week 1): Pt will thread B LEs into pants with steadying assisting using AE PRN OT Short Term Goal 3 (Week 1): Pt will tolerate standing at sink to complete 1 grooming task in standing with steadying assist  Skilled Therapeutic Interventions/Progress Updates:    Session focused on bathing and dressing tasks at shower level. Pain as reported below. Pt completed bed mobility, transitioning to EOB with min A. From EOB he donned TLSO with mod A. SPT > w/c > BSC in shower with min A using RW. Min A to doff distal LE clothing from seated level. Pt completed all bathing sitting on bari BSC in walk in shower with close (S). Pt completed SPT out of shower back to w/c with mod A. Extensive education provided to pt and wife re energy conservation principles. Pt donned shirt with set up and mod A for pants. Pt returned to supine in bed and was left with all needs met, bed alarm set.   Session 2: Pt received supine in bed c/o fatigue, depressed mood, and aching feeling all over, however agreeable to participate. Pt completed bed mobility with CGA to come EOB. He used RW to complete SPT to w/c with min A from elevated EOB. Suggested to pt to take a trip outdoors and pt very excited to try. Pt was transported outside and signed out at nurses desk. While outside, engaged pt and wife in discussion re d/c planning and current condition insight. Pt reported being very comforted by being outdoors, stating his pain was alleviating slightly and he felt his breathing was improved. Edu pt and wife on use of therapeutic grounds  pass, as well as provided evidence based research on effects of nature on mood and recovery. Pt returned inside and was in much better spirits. Pt attempted to complete sit > stand transfer x5 from w/c but was unable to power up with max A. Re-positioned w/c and with instruction/demo pt completed lateral scoot with mod A. Pt was left supine with all needs met, bed alarm set, and wife present. No further c/o pain.   Therapy Documentation Precautions:  Precautions Precautions: Back Precaution Comments: re-educated on BLT, pt cont to be reminded during function Required Braces or Orthoses: Spinal Brace Spinal Brace: Thoracolumbosacral orthotic, Applied in sitting position Restrictions Weight Bearing Restrictions: No Vital Signs: Therapy Vitals Temp: 98.1 F (36.7 C) Temp Source: Oral Pulse Rate: 100 Resp: 16 BP: 133/85 Patient Position (if appropriate): Sitting Oxygen Therapy SpO2: 90 % O2 Device: Room Air Pain: Pain Assessment Pain Scale: 0-10 Pain Score: 3  Pain Type: Acute pain Pain Location: Back Pain Orientation: Lower Pain Descriptors / Indicators: Aching Pain Frequency: Intermittent Pain Onset: On-going Patients Stated Pain Goal: 2 Pain Intervention(s): Medication (See eMAR)   Therapy/Group: Individual Therapy  Curtis Sites 07/04/2018, 7:24 AM

## 2018-07-05 ENCOUNTER — Inpatient Hospital Stay (HOSPITAL_COMMUNITY): Payer: Medicare Other

## 2018-07-05 ENCOUNTER — Encounter (HOSPITAL_COMMUNITY): Payer: Medicare Other | Admitting: Psychology

## 2018-07-05 ENCOUNTER — Inpatient Hospital Stay (HOSPITAL_COMMUNITY): Payer: Medicare Other | Admitting: Occupational Therapy

## 2018-07-05 DIAGNOSIS — C3491 Malignant neoplasm of unspecified part of right bronchus or lung: Secondary | ICD-10-CM

## 2018-07-05 DIAGNOSIS — R000 Tachycardia, unspecified: Secondary | ICD-10-CM

## 2018-07-05 DIAGNOSIS — E871 Hypo-osmolality and hyponatremia: Secondary | ICD-10-CM

## 2018-07-05 DIAGNOSIS — D72829 Elevated white blood cell count, unspecified: Secondary | ICD-10-CM

## 2018-07-05 LAB — URINALYSIS, COMPLETE (UACMP) WITH MICROSCOPIC
Bilirubin Urine: NEGATIVE
Glucose, UA: NEGATIVE mg/dL
Hgb urine dipstick: NEGATIVE
Ketones, ur: NEGATIVE mg/dL
Leukocytes, UA: NEGATIVE
Nitrite: NEGATIVE
Protein, ur: 30 mg/dL — AB
Specific Gravity, Urine: 1.028 (ref 1.005–1.030)
pH: 5 (ref 5.0–8.0)

## 2018-07-05 LAB — BASIC METABOLIC PANEL
Anion gap: 8 (ref 5–15)
BUN: 9 mg/dL (ref 8–23)
CO2: 21 mmol/L — ABNORMAL LOW (ref 22–32)
CREATININE: 0.67 mg/dL (ref 0.61–1.24)
Calcium: 7.2 mg/dL — ABNORMAL LOW (ref 8.9–10.3)
Chloride: 104 mmol/L (ref 98–111)
GFR calc Af Amer: >60 mL/min (ref >60)
GFR calc non Af Amer: >60 mL/min (ref >60)
Glucose, Bld: 116 mg/dL — ABNORMAL HIGH (ref 70–99)
Potassium: 2.9 mmol/L — ABNORMAL LOW (ref 3.5–5.1)
Sodium: 133 mmol/L — ABNORMAL LOW (ref 135–145)

## 2018-07-05 LAB — CBC
HCT: 30.1 % — ABNORMAL LOW (ref 39.0–52.0)
Hemoglobin: 9.4 g/dL — ABNORMAL LOW (ref 13.0–17.0)
MCH: 29 pg (ref 26.0–34.0)
MCHC: 31.2 g/dL (ref 30.0–36.0)
MCV: 92.9 fL (ref 80.0–100.0)
Platelets: 422 10*3/uL — ABNORMAL HIGH (ref 150–400)
RBC: 3.24 MIL/uL — ABNORMAL LOW (ref 4.22–5.81)
RDW: 16.1 % — ABNORMAL HIGH (ref 11.5–15.5)
WBC: 13.5 10*3/uL — ABNORMAL HIGH (ref 4.0–10.5)
nRBC: 0 % (ref 0.0–0.2)

## 2018-07-05 MED ORDER — METOPROLOL TARTRATE 12.5 MG HALF TABLET
12.5000 mg | ORAL_TABLET | Freq: Two times a day (BID) | ORAL | Status: DC
  Administered 2018-07-05 – 2018-07-07 (×5): 12.5 mg via ORAL
  Filled 2018-07-05 (×5): qty 1

## 2018-07-05 MED ORDER — POTASSIUM CHLORIDE CRYS ER 20 MEQ PO TBCR
30.0000 meq | EXTENDED_RELEASE_TABLET | Freq: Two times a day (BID) | ORAL | Status: DC
  Administered 2018-07-05 – 2018-07-13 (×15): 30 meq via ORAL
  Filled 2018-07-05 (×15): qty 1

## 2018-07-05 NOTE — Consult Note (Signed)
Neuropsychological Consultation   Patient:   Brian [name]   DOB:   25-May-1946  MR Number:  [id number]  Location:  [facility] [address] C[facility] [address] [place] [id number] [place] [address] Dept: [place]: [phone]           Date of Service:   07/05/2018  Start Time:   10 AM End Time:   11 AM  Provider/Observer:  Ilean Skill, Psy.D.       Clinical Neuropsychologist       Billing Code/Service: 38756 4 Units  Chief Complaint:    Brian [name] is a 73 year old male with RA, emphysema, tobacco abuse with low back and hip pain.  Patient has been dx with lung cancer with bony mets with left clavicle, right eith eighth rib, L4 vertebral body with fracture and liver lesion.  Pathology revealed metastatic squamous cell lung cancer with plans for HRT in future.  Patient had l4 corpectomy with L3-L5 fusion.  Patient was very debilitated with extensive weakness.  CIR recommended due to functional deficits.    Reason for Service:  Patient was referred for neuropsychological consultation due to coping and adjustment issues with recovery from back surgery and coping with dx of metastatic lung cancer.  Below is the HPI for the current admission.    EPP:IRJJOA R Jake Bathe is a75 year old male with history of RA, emphysema, tobacco abuse with low back and hip pain. Work-up done revealing bronchogenic RLL lung cancer with bony mets to left clavicle, right eighth rib, L4 vertebral body with fracture and 2.5 cm liver lesion. He was admitted from office for work-up on 1November 25, 202019 and surgical intervention of pathological fracture recommended by Dr. Marin Olp. He underwent L4 corpectomy with L3-L5 fusion by Dr. Rolena Infante on 12/20. Perioperatively had blood loss anemia with hematoma at corpectomy site requiring 4 units PRBC and 2 units FFP. Surgery stopped due to bleeding and CT abdomen/pelvis revealed small retroperitoneal hematoma  extending to psoas muscle. Once stabilized, he was taken back to the OR on 12/12 for screw fixation L3-L5. Pathology revealed metastatic squamous cell lung cancer with plans for HRT in future.  He continuedto have issues with severe pain as well as bilateral lower extremity weakness. Follow up MRI spine 12/27 revealed retroperitoneal hematoma, s/p corpectomy with L3-5 fusion and unchanged severe spinal canal stenosis L4 level. He hasrequired multiple units packed red blood cells as well as IV iron for anemia. Follow-up CT lumbar spine 1/1revealed large right and small left retroperitoneal hematomas expanding psoas muscle--unchanged. Rash on back stable. Palliative care consulted for symptom management as well as GOC. Fentanyl and dilaudid added for pain control and due to ongoing issues with neuropathy was taken back to OR on 06/24/18 for L4/5 Gill decompression. He has had improvement in pain control and mobility and CIR recommended due to functional deficits.    Impression/DX:  Patient was oriented and alert.  He reports that he has not adjusted to the cancer dx and has been given very little information as the surgery on back and motor deficits have been the primary focus so far.  He reports that his lack of information has increased his anxiety.  While he knows the general nature of his cancer, he would like more information.  Disposition/Plan:  It would be very helpful for oncology to provide feedback to patient and family if possible.  While the patient is not expecting to be told that they are going to  cure him, he would like information about possible plans for treatment attempts or not.    Diagnosis:    Pathological compression fracture of lumbar vertebra, sequela - Plan: Ambulatory referral to Physical Medicine Rehab  DVT of lower extremity (deep venous thrombosis) ([place]) - Plan: IR IVC FILTER PLMT / S&I /IMG GUID/MOD SED, IR IVC FILTER PLMT / S&I /IMG GUID/MOD SED  Deep vein  thrombosis (DVT) of proximal lower extremity, unspecified chronicity, unspecified laterality ([facility] [place]) - Plan: IR IVC FILTER PLMT / S&I /IMG GUID/MOD SED  Leukocytosis - Plan: DG Chest 2 View, DG Chest 2 View         Electronically Signed   _______________________ Ilean Skill, Psy.D.

## 2018-07-05 NOTE — Progress Notes (Signed)
Physical Therapy Session Note  Patient Details  Name: Brian [name] MRN: [id number] Date of Birth: [birth year]  Today's Date: 07/05/2018 PT Individual Time: 1520-1615 PT Individual Time Calculation (min): 55 min   Short Term Goals: Week 1:  PT Short Term Goal 1 (Week 1): pt will move supine> sit with supervision PT Short Term Goal 2 (Week 1): pt will perform basic transfer with +1 consistently PT Short Term Goal 3 (Week 1): pt will propel w/c x 100' PT Short Term Goal 4 (Week 1): pt will perform gait with LRAD x 35' with min assist PT Short Term Goal 5 (Week 1): pt will demonstrate knowledge of back precautions when moving in/out of bed wiht 0-1 cue  Skilled Therapeutic Interventions/Progress Updates:   Pt sitting up  In w/c, c/o RLE being too high, caused back pain.  PT lowered R legrest which was beneficial.  Pt propelled w/c using bil UEs x 75' using bil UEs with supervision and cues for efficiency.  Sit> stand at sturdy, high table, using RW, +2.  Pt stood x 3 minutes  With min assist, 1UE support, while putting together puzzle.    Gait training with RW; +2 to come to standing.  Once standing, gait on level tile x 18' with min assist.  Tactile cues for R knee extension.  PT adjusted ELRs longer; pt able to tolerate LEs being elevated, and back pain lessened.  He would benefit from change of R ELR to same type as L, for better calf support.  W/c propulsion toward room, x 75'.   Slide board transfer with set up, min assist to initiate; level transfer back to bed.  PT doffed TLSO with pt sitting EOB.  Mod assist to bring bil LEs up into bed.  Bed alarm set and needs at hand.  Pt awaiting chest XRay.     Therapy Documentation Precautions:  Precautions Precautions: Back Precaution Comments: re-educated on BLT, pt cont to be reminded during function Required Braces or Orthoses: Spinal Brace Spinal Brace: Thoracolumbosacral orthotic, Applied in sitting  position Restrictions Weight Bearing Restrictions: No   Pain: 4/10 low back; premedicated and ELRs adjusted on w/c         Therapy/Group: Individual Therapy  Brian [name] 07/05/2018, 4:31 PM

## 2018-07-05 NOTE — Plan of Care (Signed)
  Problem: RH PAIN MANAGEMENT Goal: RH STG PAIN MANAGED AT OR BELOW PT'S PAIN GOAL Description Less than 3,on 1 to 10 scale.  Outcome: Progressing

## 2018-07-05 NOTE — Progress Notes (Signed)
Occupational Therapy Session Note  Patient Details  Name: Brian [name] MRN: [id number] Date of Birth: 05/07/1946  Today's Date: 07/05/2018 OT Individual Time: 1100-1200 Session 2: 1400-1500 OT Individual Time Calculation (min): 60 min Session 2: 60 min   Short Term Goals: Week 1:  OT Short Term Goal 1 (Week 1): Pt will complete transfer to toillet/BSC with mod A using LRAD OT Short Term Goal 2 (Week 1): Pt will thread B LEs into pants with steadying assisting using AE PRN OT Short Term Goal 3 (Week 1): Pt will tolerate standing at sink to complete 1 grooming task in standing with steadying assist  Skilled Therapeutic Interventions/Progress Updates:    Pt received sitting up in w/c with c/o pain 5/10 in back, RN aware and present administering pain medication. Pt requested to go outside for emotional support/mood. Pt completed 50 ft of w/c propulsion with occasional cueing for UE placement. Pt with high levels of fatigue this session requiring frequent rest breaks and repositioning for back pain as well. Pt was briefly brought outside where he reflected on current condition and future. He returned inside and completed 5 min on B UE ergometer before requiring rest break. Pt requested to return supine in bed. 3x sit <> stand transfer attempted, unable to clear w/c with max A. Attempted lateral scoot to bed 1x before this OT made decision to pursue sliding board to maximize safety d/t pt fatigue. With slide board pt transferred back to bed, mod A. TLSO doffed. Cueing for UE placement. Pt left supine with request for pain medication, RN aware and entering room. Bed alarm set.   Session 2: Session focused on B UE strengthening and ADL transfers. Pt received supine in bed, c/o pain 5/10 in back along surgical incision. Pt requesting OOB mobility as pain management strategy. Pt transitioned to sitting EOB with CGA, demonstrating good carryover of log rolling technique. Max A to don abdominal binder  and TLSO.  Pt completed sit > stand transfer from EOB with mod A, with EOB highly elevated and use of RW. Once standing pt required only CGA to take several steps to w/c. Pt was given demo re use of reacher to don pants and he practiced threading B LE with (S) and cueing for technique. Pt was brought down to therapy gym where he completed another sit> stand transfer with MAX A. He sat EOM and completed B UE strengthening circuit with a focus on triceps to increase strength needed to push up from w/c during transfers. Pt returned to w/c from highly elevated mat with min A (following extended rest break). Pt returned to his room and was left sitting up with all needs met, family and RN present to administer more pain medication.   Therapy Documentation Precautions:  Precautions Precautions: Back Precaution Comments: re-educated on BLT, pt cont to be reminded during function Required Braces or Orthoses: Spinal Brace Spinal Brace: Thoracolumbosacral orthotic, Applied in sitting position Restrictions Weight Bearing Restrictions: No  Therapy/Group: Individual Therapy  Curtis Sites 07/05/2018, 10:16 AM

## 2018-07-05 NOTE — Progress Notes (Signed)
[place] PHYSICAL MEDICINE & REHABILITATION PROGRESS NOTE  Subjective/Complaints: Patient seen laying in bed this morning.  He states he slept fairly overnight.  He is ready to resume therapies.  ROS: Denies chest pain, shortness of breath, nausea, vomiting, diarrhea.  Objective: Vital Signs: Blood pressure 112/65, pulse (!) 110, temperature 99.2 F (37.3 C), temperature source Oral, resp. rate 20, height 5\' 10"  (1.778 m), weight 92.1 kg, SpO2 (!) 87 %. No results found. Recent Labs    07/05/18 0645  WBC 13.5*  HGB 9.4*  HCT 30.1*  PLT 422*   Recent Labs    07/05/18 0645  NA 133*  K 2.9*  CL 104  CO2 21*  GLUCOSE 116*  BUN 9  CREATININE 0.67  CALCIUM 7.2*    Physical Exam: BP 112/65 (BP Location: Right Arm)   Pulse (!) 110   Temp 99.2 F (37.3 C) (Oral)   Resp 20   Ht 5\' 10"  (1.778 m)   Wt 92.1 kg   SpO2 (!) 87%   BMI 29.13 kg/m  Constitutional: No distress . Vital signs reviewed. HENT: Normocephalic.  Atraumatic. Eyes: EOMI. No discharge. Cardiovascular: RRR.  No JVD. Respiratory: CTA bilaterally.  Normal effort. GI: BS +. Non-distended. Musculoskeletal: Bilateral lower extremity edema, left greater than right, stable Neurological: He is alert and oriented  Dysphonia  Able to follow basic commands without difficulty.  Motor: Bilateral UE 5/5 proximal to distal, unchanged Right lower extremity: 4/5 proximal distal, unchanged Left lower extremity: Hip flexion 3-/5, knee extension 4-/5, ankle dorsiflexion 4/5, stable Skin: Rash improving Psychiatric: His behavior is normal.Judgmentand thought contentnormal. His affect is blunt.  Assessment/Plan: 1. Functional deficits secondary to lung cancer with mets to ribs, vertebrae with oncological fracture status post lumbar fusion which require 3+ hours per day of interdisciplinary therapy in a comprehensive inpatient rehab setting.  Physiatrist is providing close team supervision and 24 hour management  of active medical problems listed below.  Physiatrist and rehab team continue to assess barriers to discharge/monitor patient progress toward functional and medical goals  Care Tool:  Bathing  Bathing activity did not occur: Safety/medical concerns(Unable to assess 2/2 drop in BP with upright positioning) Body parts bathed by patient: Right arm, Left arm, Chest, Face, Abdomen, Front perineal area, Buttocks, Right upper leg, Left upper leg   Body parts bathed by helper: Right lower leg, Left lower leg     Bathing assist Assist Level: Minimal Assistance - Patient > 75%     Upper Body Dressing/Undressing Upper body dressing Upper body dressing/undressing activity did not occur (including orthotics): Safety/medical concerns(B/P issues ) What is the patient wearing?: Pull over shirt, Orthosis Orthosis activity level: Performed by helper  Upper body assist Assist Level: Supervision/Verbal cueing    Lower Body Dressing/Undressing Lower body dressing      What is the patient wearing?: Pants     Lower body assist Assist for lower body dressing: Moderate Assistance - Patient 50 - 74%     Toileting Toileting Toileting Activity did not occur Landscape architect and hygiene only): Safety/medical concerns(Unable to assess 2/2 drop in BP with upright positioning)  Toileting assist Assist for toileting: Maximal Assistance - Patient 25 - 49%     Transfers Chair/bed transfer  Transfers assist     Chair/bed transfer assist level: Moderate Assistance - Patient 50 - 74%     Locomotion Ambulation   Ambulation assist   Ambulation activity did not occur: Safety/medical concerns  Assist level: Minimal Assistance - Patient >  75% Assistive device: Walker-rolling Max distance: 55'   Walk 10 feet activity   Assist     Assist level: Minimal Assistance - Patient > 75% Assistive device: Walker-rolling   Walk 50 feet activity   Assist           Walk 150 feet  activity   Assist           Walk 10 feet on uneven surface  activity   Assist           Wheelchair     Assist Will patient use wheelchair at discharge?: Yes Type of Wheelchair: Manual Wheelchair activity did not occur: Safety/medical concerns(hypotensive)  Wheelchair assist level: Supervision/Verbal cueing Max wheelchair distance: 150'    Wheelchair 50 feet with 2 turns activity    Assist    Wheelchair 50 feet with 2 turns activity did not occur: Safety/medical concerns   Assist Level: Supervision/Verbal cueing   Wheelchair 150 feet activity     Assist Wheelchair 150 feet activity did not occur: Safety/medical concerns   Assist Level: Supervision/Verbal cueing      Medical Problem List and Plan: 1.Functional and mobility deficitssecondary to metastatic lung cancer to left clavicle, ribs, L4 vertebral body with fracture. Pt is s/p L4 corpectomy and L3-5 fusion by Dr. Rolena Infante on 06/11/18  Continue CIR 2. DVT: dopplers done this evening and patient has DVT in left CFV             IVC filter placement on 1/8  Pt not a candidate for anticoagulation due to hematoma and bleeding riskat present although long term he is high VTE risk 3. Pain Management:Continue Fentanyl 12.5 mcg with norco/dilaudid prn. Unable to tolerate Oxycodone. Monitor patient's pain with increased activity.  Controlled on 1/13 4. Mood:LCSW to follow for evaluation and support. 5. Neuropsych: This patientiscapable of making decisions on hisown behalf. 6. Skin/Wound Care:Monitor wound for healing. Has been refusing ensure--Added prostat to help promote wound healing. 7. Fluids/Electrolytes/Nutrition:Monitor I/O.  8.Blood pressure; Intermittent elevation noted   Orthostatic vital signs ordered on 1/10, remains pending on 1/13  Abdominal binder as needed  Monitor with increased mobility 9. ABLA: Continue to monitor with serial checks.   Hemoglobin 9.4 on 1/13 10.  Hypokalemia:   Potassium 2.9 on 1/13   Supplement increased on 1/13   Labs ordered for tomorrow  Magnesium 1.7 on 1/8   Labs ordered for tomorrow 11. Rash: Continue Clobetasol bidas well as hypoallergenic sheets. 12. Neuropathic pain: Improving on gabapentin 300 mg tid 13. Constipation:Managed with miralax prn--patient wants to manage his bowel program. 14.  Hypoalbuminemia  Supplement initiated on 1/8 15.  Sinus tachycardia  Metoprolol 12.5 twice daily started on 1/13 16.  Hyponatremia  Sodium 133 on 1/13  Continue to monitor 17.  Leukocytosis  WBCs 13.5 on 1/13  Labs ordered for tomorrow  UA ordered   Chest x-ray ordered  Afebrile  Continue to monitor  LOS: 6 days A FACE TO FACE EVALUATION WAS PERFORMED  Laruen Risser [name] 07/05/2018, 8:52 AM

## 2018-07-05 NOTE — Progress Notes (Signed)
   07/05/18 0900  Clinical Encounter Type  Visited With Family  Visit Type Initial;Social support  Spiritual Encounters  Spiritual Needs Emotional   Met pt's daughter outside hospital entrance, some social talk, told me today is her father's first day of rehab and expressed concern and asked me to pray for him.  Told her I will try to stop by later in the day to say hello and meet her dad.  Talked about ways to get some time away from hospital and affirmed her efforts thus far.  Myra Gianotti resident, [phone]

## 2018-07-05 NOTE — Progress Notes (Signed)
Occupational Therapy Session Note  Patient Details  Name: Brian [name] MRN: [id number] Date of Birth: [birth year]  Today's Date: 07/05/2018 OT Individual Time: 0845-1000 OT Individual Time Calculation (min): 75 min    Short Term Goals: Week 1:  OT Short Term Goal 1 (Week 1): Pt will complete transfer to toillet/BSC with mod A using LRAD OT Short Term Goal 2 (Week 1): Pt will thread B LEs into pants with steadying assisting using AE PRN OT Short Term Goal 3 (Week 1): Pt will tolerate standing at sink to complete 1 grooming task in standing with steadying assist  Skilled Therapeutic Interventions/Progress Updates:    Pt seen for OT session focusing on functional transfers and ADL re-training. Pt in supine upon arrival with wife, daughter, and RN present. Pt reports late in receiving pain meds, RN having just given it to him. Pain 5/10 currently while at rest in supine. Pt willing to cont with therapy as able.  BP in supine 113/67, transitioned to sitting EOB with min A using hospital bed functions. BP sitting EOB 109/72, asymptomatic.  He completed stand pivot transfer to w/c, min A to stand from elevated EOB and min A with cuing for RW management to transition to w/c.  He required mod-max A and multiple trials to stand from w/c with use of grab bars for stand pivot transfer to toilet. With steadying assist, pt able to manage pants. Continent BM. Following toileting task, pt voiced too fatigue to shower and requested return to w/c. Used STEDY to stand to complete clothing management and transfer back to w/c 2/2 fatigue. Total A for hygiene and clothing management while standing in STEDY. Completed UB/LB bathing/dressing from w/c level at sink. Set-up UB bathing/dressing, pt able to maintain back pre-cautions while reaching forward to thread pants. He stood at sink with max A to come into standing and able to pull pants up with steadying assist. Total A to don TED hose and socks.  Throughout  session, required significantly increased time and rest breaks to complete all ADL tasks. Extensive education/discussion regarding pt's goals, OT/PT goals, energy conservation, and d/c planning.   Therapy Documentation Precautions:  Precautions Precautions: Back Precaution Comments: re-educated on BLT, pt cont to be reminded during function Required Braces or Orthoses: Spinal Brace Spinal Brace: Thoracolumbosacral orthotic, Applied in sitting position Restrictions Weight Bearing Restrictions: No    Therapy/Group: Individual Therapy  Izabella Marcantel L 07/05/2018, 7:12 AM

## 2018-07-06 ENCOUNTER — Inpatient Hospital Stay (HOSPITAL_COMMUNITY): Payer: Medicare Other | Admitting: Occupational Therapy

## 2018-07-06 ENCOUNTER — Inpatient Hospital Stay (HOSPITAL_COMMUNITY): Payer: Medicare Other

## 2018-07-06 DIAGNOSIS — G479 Sleep disorder, unspecified: Secondary | ICD-10-CM

## 2018-07-06 DIAGNOSIS — R609 Edema, unspecified: Secondary | ICD-10-CM

## 2018-07-06 DIAGNOSIS — R600 Localized edema: Secondary | ICD-10-CM

## 2018-07-06 LAB — CBC WITH DIFFERENTIAL/PLATELET
ABS IMMATURE GRANULOCYTES: 0.47 10*3/uL — AB (ref 0.00–0.07)
Basophils Absolute: 0.1 10*3/uL (ref 0.0–0.1)
Basophils Relative: 1 %
Eosinophils Absolute: 1.2 10*3/uL — ABNORMAL HIGH (ref 0.0–0.5)
Eosinophils Relative: 8 %
HCT: 30.9 % — ABNORMAL LOW (ref 39.0–52.0)
Hemoglobin: 9.5 g/dL — ABNORMAL LOW (ref 13.0–17.0)
Immature Granulocytes: 3 %
Lymphocytes Relative: 21 %
Lymphs Abs: 3.1 10*3/uL (ref 0.7–4.0)
MCH: 28.8 pg (ref 26.0–34.0)
MCHC: 30.7 g/dL (ref 30.0–36.0)
MCV: 93.6 fL (ref 80.0–100.0)
Monocytes Absolute: 1.2 10*3/uL — ABNORMAL HIGH (ref 0.1–1.0)
Monocytes Relative: 8 %
Neutro Abs: 8.7 10*3/uL — ABNORMAL HIGH (ref 1.7–7.7)
Neutrophils Relative %: 59 %
Platelets: 440 10*3/uL — ABNORMAL HIGH (ref 150–400)
RBC: 3.3 MIL/uL — ABNORMAL LOW (ref 4.22–5.81)
RDW: 16.1 % — ABNORMAL HIGH (ref 11.5–15.5)
WBC: 14.8 10*3/uL — ABNORMAL HIGH (ref 4.0–10.5)
nRBC: 0 % (ref 0.0–0.2)

## 2018-07-06 LAB — BASIC METABOLIC PANEL
Anion gap: 6 (ref 5–15)
BUN: 10 mg/dL (ref 8–23)
CO2: 24 mmol/L (ref 22–32)
CREATININE: 0.71 mg/dL (ref 0.61–1.24)
Calcium: 7.5 mg/dL — ABNORMAL LOW (ref 8.9–10.3)
Chloride: 108 mmol/L (ref 98–111)
GFR calc Af Amer: >60 mL/min (ref >60)
Glucose, Bld: 109 mg/dL — ABNORMAL HIGH (ref 70–99)
Potassium: 3.9 mmol/L (ref 3.5–5.1)
Sodium: 138 mmol/L (ref 135–145)

## 2018-07-06 LAB — MAGNESIUM: Magnesium: 1.6 mg/dL — ABNORMAL LOW (ref 1.7–2.4)

## 2018-07-06 MED ORDER — FENTANYL 12 MCG/HR TD PT72
1.0000 | MEDICATED_PATCH | TRANSDERMAL | Status: DC
  Administered 2018-07-06: 1 via TRANSDERMAL
  Filled 2018-07-06: qty 1

## 2018-07-06 MED ORDER — METHOCARBAMOL 500 MG PO TABS
500.0000 mg | ORAL_TABLET | ORAL | Status: DC | PRN
  Administered 2018-07-06 – 2018-07-08 (×7): 500 mg via ORAL
  Filled 2018-07-06 (×8): qty 1

## 2018-07-06 MED ORDER — MAGNESIUM OXIDE 400 (241.3 MG) MG PO TABS
200.0000 mg | ORAL_TABLET | Freq: Every day | ORAL | Status: DC
  Administered 2018-07-06 – 2018-07-13 (×8): 200 mg via ORAL
  Filled 2018-07-06 (×8): qty 1

## 2018-07-06 NOTE — Progress Notes (Addendum)
Daily Progress Note   Patient Name: Brian [name]       Date: 07/06/2018 DOB: 03-29-1946  Age: 73 y.o. MRN#: [id number] Attending Physician: Jamse Arn, MD Primary Care Physician: [name], MD Admit Date: 06/29/2018  Reason for Consultation/Follow-up: Pain management  Subjective: Patient is resting in bed., he is agitated in conversation. He is frustrated he is not making faster progress, and cannot do some of the things physically that therapists asks. He states his pain is currently controlled. He states his biggest issue is grabbing/craming pain in his back at the op site. His wife and daughter are present. They state he has been upset with getting pain medication but does not ask. They state he told a staff member "just bring me my damn pain medicine" when she tried to assess his pain to determine correct medication for pain type and amount of medication. They are aware staff needs to assess his pain as is he, and should assess before any pain medication administration.   Daughter and wife speak with me out of the room. They state he is anxious and wants to speak with Oncology, but particularly Dr. Marin Olp.   Nursing endorses difficulty with pain medicine administration.   He does have a rash that is more wide spread and is now itching where it has not previously. Would recommend further assessment by dermatology if possible, and treatment of this.   Robaxin frequency increased to provide 1 extra dose in a 24 hr period. Plan was discussed with Linna Hoff PA with rehab.  Length of Stay: 7  Current Medications: Scheduled Meds:  . clobetasol ointment   Topical BID  . feeding supplement (PRO-STAT SUGAR FREE 64)  30 mL Oral BID  . fentaNYL  12.5 mcg Transdermal Q72H  . gabapentin   300 mg Oral TID  . guaiFENesin  10 mL Oral Q6H  . magnesium oxide  200 mg Oral Daily  . metoprolol tartrate  12.5 mg Oral BID  . pantoprazole  40 mg Oral Daily  . potassium chloride  30 mEq Oral BID  . senna  1 tablet Oral QHS    Continuous Infusions:   PRN Meds: acetaminophen, albuterol, alum & mag hydroxide-simeth, bisacodyl, camphor-menthol, diphenhydrAMINE, guaiFENesin-dextromethorphan, HYDROcodone-acetaminophen, HYDROmorphone HCl, methocarbamol, polyethylene glycol, prochlorperazine **OR** prochlorperazine **OR** prochlorperazine, sodium phosphate, traZODone  Physical Exam Pulmonary:     Effort: Pulmonary effort is normal.  Skin:    General: Skin is warm and dry.     Findings: Rash present.             Vital Signs: BP 109/67 (BP Location: Left Arm)   Pulse (!) 115   Temp 99.4 F (37.4 C) (Oral)   Resp 18   Ht 5\' 10"  (1.778 m)   Wt 92.1 kg   SpO2 92%   BMI 29.13 kg/m  SpO2: SpO2: 92 % O2 Device: O2 Device: Room Air O2 Flow Rate: O2 Flow Rate (L/min): 2 L/min  Intake/output summary:   Intake/Output Summary (Last 24 hours) at 07/06/2018 1448 Last data filed at 07/06/2018 1254 Gross per 24 hour  Intake 480 ml  Output 800 ml  Net -320 ml   LBM: Last BM Date: 07/03/18 Baseline Weight: Weight: 96.8 kg Most recent weight: Weight: 92.1 kg       Palliative Assessment/Data: 40%      Patient Active Problem List   Diagnosis Date Noted  . Sleep disturbance   . Peripheral edema   . Leukocytosis   . Hyponatremia   . Sinus tachycardia   . Hypomagnesemia   . Orthostatic hypotension   . Labile blood pressure   . Cancer associated pain   . DVT of lower extremity (deep venous thrombosis) ([place])   . Malignant neoplasm of lung ([place])   . Hypoalbuminemia due to protein-calorie malnutrition ([place])   . Acute blood loss anemia   . Postoperative pain   . Pathological compression fracture of lumbar vertebra ([place]) 06/29/2018  . Metastatic lung cancer (metastasis from lung  to other site) ([place]) 06/28/2018  . Rash 06/25/2018  . Hypokalemia 06/25/2018  . Normocytic anemia 06/25/2018  . S/P lumbar fusion 06/24/2018  . Palliative care by specialist   . Cancer related pain   . Lung cancer, primary, with metastasis from lung to other site, right ([place]) 06/14/2018  . Squamous cell lung cancer, right ([place]) 06/14/2018  . Lung cancer metastatic to bone ([place]) 06/14/2018  . Bronchitis due to tobacco use 06/07/2018  . Lumbar spine tumor 06/04/2018    Palliative Care Assessment & Plan   Recommendations/Plan:  Will continue to follow.  Recommend Fentanyl patch for sustained pain control.  Recommend Tylenol for mild pain. Recommend Norco for moderate pain. Recommend Dilaudid for severe pain.  Recommend Robaxin for muscle spasms. Recommend increasing Neurontin as needed for neuropathic pain.   Code Status:    Code Status Orders  (From admission, onward)         Start     Ordered   06/29/18 1526  Full code  Continuous     06/29/18 1525        Code Status History    Date Active Date Inactive Code Status Order ID Comments User Context   06/11/2018 2031 06/29/2018 1440 Full Code [id number]  Milagros Loll, MD Inpatient   06/11/2018 2011 06/11/2018 2030 Full Code [id number]  Melina Schools, MD Inpatient   06/04/2018 1344 06/11/2018 2011 Full Code [id number]  Ardeen Jourdain, PA-C Inpatient       Prognosis:  Poor overall. Metastatic cancer with pathologic fracture diagnosed in December.   Discharge Planning:  To Be Determined  Care plan was discussed with RN  Thank you for allowing the Palliative Medicine Team to assist in the care of this patient.   Time In: 1:30 Time Out: 3:00 Total Time 90 min Prolonged Time Billed  yes      Greater than 50%  of this time was spent counseling and coordinating care related to the above assessment and plan.  Asencion Gowda, NP  Please contact Palliative Medicine Team phone at [phone] for questions and  concerns.

## 2018-07-06 NOTE — Progress Notes (Signed)
Occupational Therapy Session Note  Patient Details  Name: Creighton Longley MRN: [id number] Date of Birth: 05/21/1946  Today's Date: 07/06/2018 OT Individual [id number] OT Individual Time Calculation (min): 60 min and 75 min    Short Term Goals: Week 1:  OT Short Term Goal 1 (Week 1): Pt will complete transfer to toillet/BSC with mod A using LRAD OT Short Term Goal 2 (Week 1): Pt will thread B LEs into pants with steadying assisting using AE PRN OT Short Term Goal 3 (Week 1): Pt will tolerate standing at sink to complete 1 grooming task in standing with steadying assist  Skilled Therapeutic Interventions/Progress Updates:    Session One: Pt seen for OT session focusing on functional sitting balance, ADL re-training and transfers. Pt in supine upon arrival with wife and daughter present. Pt voicing pain 5/10 in lower back, RN made aware though pt up to date on all pain meds. Provided with heating pack at end of session for comfort. He transferred to sitting EOB with mod A using hospital bed functions. He completed UB bathing/dressing seated EOB with supervision, TLSO donned by therapist. Abdominal binder donned for comfort per pt request.  Discussed at length energy conservation and goals of therapy. Opted to complete sliding board transfer this morning for energy conservation. Completed with min A with rest reaks required throughout transfer. He completed grooming tasks from w/c level at sink with set-up. Pt demonstrating very poor frustration management throughout session, requiring VCs and education. Pt taken to therapy gym total A in w/c for time and energy conservation. Attempted sit>stand at side of parallel bar, ultimately requiring max A +2 to stand. Pt with increased pain in lower back upon standing and unable to tolerate stand >5 seconds before requiring seated break. Pt returned to room. TLSO doffed and heating pad applied to lower back for comfort. RN made aware of  events of session. Pt left seated in w/c with family present.  Throughout session, extensive education provided regarding benefits of OOB and ADL participation, energy conservation, modified activitity and activity progression, therapy goals, pain management, and d/c planning.   Session Two: Pt seen for OT session focusing on functional standing balance and endurance and sit>stand. Pt sitting up in w/c upon arrival, voiced pain stable at 5/10 following 1+hour in w/c. Reports heat pack assisted with pain management. Pt taken to therapy gym total A in w/c for time and energy conservation. Completed level transfer via sliding board to therapy mat with CGA and VCs for technique. Completed x3 sit>stand from elevated EOM to RW with min A. Pt tolerating ~1-2 minutes in standing while completing card matching task, able to maintain static standing balance with min A. Seated rest breaks provided btwn trials. On last trial pt stated "I'm done, requesting to return to room and to bed". Min A sliding board transfer back to w/c.  In room, completed min A sliding board transfer back to bed, mod-max A to return to supine with VCs for log rolling technique.  Instructed through UE strengthening exercise using level II (orange) theraband for chest expansion, diagonal up, and diagonal down. Completed x1 set of 10 with VCs and demonstration for proper form and technique. Educated regarding importance of proper form and technique and maintaining back pre-cautions during exercises. Pt left in supine with family present, set-up with meal tray and all needs in reach.    Therapy Documentation Precautions:  Precautions Precautions: Back Precaution Comments: re-educated on BLT, pt cont to be reminded during  function Required Braces or Orthoses: Spinal Brace Spinal Brace: Thoracolumbosacral orthotic, Applied in sitting position Restrictions Weight Bearing Restrictions: No   Therapy/Group: Individual Therapy  Izsak Meir  L 07/06/2018, 7:04 AM

## 2018-07-06 NOTE — Plan of Care (Signed)
Pt's plan of care adjusted to 15/7 after speaking with care team and discussed with MD in team conference as pt currently unable to tolerate current therapy schedule with OT, and PT.

## 2018-07-06 NOTE — Progress Notes (Signed)
[place] PHYSICAL MEDICINE & REHABILITATION PROGRESS NOTE  Subjective/Complaints: Patient seen laying in bed this morning.  Daughter at bedside.  Daughter >patient with several questions regarding edema, back pain, chemotherapy/radiation, sleep, fatigue, ambulation, medications, breathing, etc.  ROS: + Back pain.  Denies chest pain, shortness of breath, nausea, vomiting, diarrhea.  Objective: Vital Signs: Blood pressure 114/67, pulse (!) 103, temperature 99.4 F (37.4 C), temperature source Oral, resp. rate 16, height 5\' 10"  (1.778 m), weight 92.1 kg, SpO2 90 %. Dg Chest 2 View  Result Date: 07/05/2018 CLINICAL DATA:  Chest congestion, cough, leukocytosis EXAM: CHEST - 2 VIEW COMPARISON:  06/13/2018 FINDINGS: Rounded right lower lobe masses again noted as seen on prior chest CT. Mild cardiomegaly. Linear scarring in the left base. No effusions or acute bony abnormality. IMPRESSION: Right lower lobe mass again noted. Mild cardiomegaly. Electronically Signed   By: Rolm Baptise M.D.   On: 07/05/2018 17:02   Recent Labs    07/05/18 0645 07/06/18 0527  WBC 13.5* 14.8*  HGB 9.4* 9.5*  HCT 30.1* 30.9*  PLT 422* 440*   Recent Labs    07/05/18 0645 07/06/18 0527  NA 133* 138  K 2.9* 3.9  CL 104 108  CO2 21* 24  GLUCOSE 116* 109*  BUN 9 10  CREATININE 0.67 0.71  CALCIUM 7.2* 7.5*    Physical Exam: BP 114/67 (BP Location: Left Arm)   Pulse (!) 103   Temp 99.4 F (37.4 C) (Oral)   Resp 16   Ht 5\' 10"  (1.778 m)   Wt 92.1 kg   SpO2 90%   BMI 29.13 kg/m  Constitutional: No distress . Vital signs reviewed. HENT: Normocephalic.  Atraumatic. Eyes: EOMI. No discharge. Cardiovascular: + Tachycardia.  Regular rhythm.  No JVD. Respiratory: CTA bilaterally.  Normal effort. GI: BS +. Non-distended. Musculoskeletal: Bilateral lower extremity edema, left greater than right, unchanged Neurological: He is alert and oriented  Dysphonia  Able to follow basic commands without  difficulty.  Motor: Bilateral UE 5/5 proximal to distal, stable Right lower extremity: 4-/5 proximal distal Left lower extremity: Hip flexion 3-/5, knee extension 4-/5, ankle dorsiflexion 4/5, unchanged Skin: Rash improving Psychiatric: His behavior is normal.Judgmentand thought contentnormal. His affect is blunt.  Assessment/Plan: 1. Functional deficits secondary to lung cancer with mets to ribs, vertebrae with oncological fracture status post lumbar fusion which require 3+ hours per day of interdisciplinary therapy in a comprehensive inpatient rehab setting.  Physiatrist is providing close team supervision and 24 hour management of active medical problems listed below.  Physiatrist and rehab team continue to assess barriers to discharge/monitor patient progress toward functional and medical goals  Care Tool:  Bathing  Bathing activity did not occur: Safety/medical concerns(Unable to assess 2/2 drop in BP with upright positioning) Body parts bathed by patient: Right arm, Left arm, Chest, Face, Abdomen, Front perineal area, Buttocks, Right upper leg, Left upper leg   Body parts bathed by helper: Right lower leg, Left lower leg     Bathing assist Assist Level: Minimal Assistance - Patient > 75%     Upper Body Dressing/Undressing Upper body dressing Upper body dressing/undressing activity did not occur (including orthotics): Safety/medical concerns(B/P issues ) What is the patient wearing?: Pull over shirt, Orthosis Orthosis activity level: Performed by helper  Upper body assist Assist Level: Set up assist    Lower Body Dressing/Undressing Lower body dressing      What is the patient wearing?: Pants     Lower body assist Assist for  lower body dressing: Minimal Assistance - Patient > 75%     Toileting Toileting Toileting Activity did not occur Landscape architect and hygiene only): Safety/medical concerns(Unable to assess 2/2 drop in BP with upright positioning)   Toileting assist Assist for toileting: Moderate Assistance - Patient 50 - 74%(While standing in STEDY)     Transfers Chair/bed transfer  Transfers assist     Chair/bed transfer assist level: Minimal Assistance - Patient > 75%(slide board, level)     Locomotion Ambulation   Ambulation assist   Ambulation activity did not occur: Safety/medical concerns  Assist level: Minimal Assistance - Patient > 75%(w/c follow for safety) Assistive device: Walker-rolling Max distance: 18   Walk 10 feet activity   Assist     Assist level: Minimal Assistance - Patient > 75% Assistive device: Walker-rolling   Walk 50 feet activity   Assist           Walk 150 feet activity   Assist           Walk 10 feet on uneven surface  activity   Assist           Wheelchair     Assist Will patient use wheelchair at discharge?: Yes Type of Wheelchair: Manual Wheelchair activity did not occur: Safety/medical concerns(hypotensive)  Wheelchair assist level: Supervision/Verbal cueing Max wheelchair distance: 75    Wheelchair 50 feet with 2 turns activity    Assist    Wheelchair 50 feet with 2 turns activity did not occur: Safety/medical concerns   Assist Level: Supervision/Verbal cueing   Wheelchair 150 feet activity     Assist Wheelchair 150 feet activity did not occur: Safety/medical concerns   Assist Level: Supervision/Verbal cueing      Medical Problem List and Plan: 1.Functional and mobility deficitssecondary to metastatic lung cancer to left clavicle, ribs, L4 vertebral body with fracture. Pt is s/p L4 corpectomy and L3-5 fusion by Dr. Rolena Infante on 06/11/18  Continue CIR 2. DVT: dopplers done this evening and patient has DVT in left CFV             IVC filter placement on 1/8  Pt not a candidate for anticoagulation due to hematoma and bleeding riskat present although long term he is high VTE risk 3. Pain Management:Continue Fentanyl 12.5 mcg  with norco/dilaudid prn. Unable to tolerate Oxycodone. Monitor patient's pain with increased activity.  Relatively controlled on 1/14 4. Mood:LCSW to follow for evaluation and support. 5. Neuropsych: This patientiscapable of making decisions on hisown behalf. 6. Skin/Wound Care:Monitor wound for healing. Has been refusing ensure--Added prostat to help promote wound healing. 7. Fluids/Electrolytes/Nutrition:Monitor I/O.  8.Blood pressure; Intermittent elevation noted   Orthostatic vital signs ordered on 1/10, remains pending on 1/14  Abdominal binder as needed  Monitor with increased mobility 9. ABLA: Continue to monitor with serial checks.   Hemoglobin 9.5 on 1/14 10. Hypokalemia:   Potassium 3.9 on 1/14   Supplement increased on 1/13  Magnesium 1.6 on 1/14   Supplement initiated on 1/14 11. Rash: Continue Clobetasol bidas well as hypoallergenic sheets. 12. Neuropathic pain: Improving on gabapentin 300 mg tid 13. Constipation:Managed with miralax prn--patient wants to manage his bowel program. 14.  Hypoalbuminemia  Supplement initiated on 1/8 15.  Sinus tachycardia  Metoprolol 12.5 twice daily started on 1/13 16.  Hyponatremia  Sodium 133 on 1/13  Continue to monitor 17.  Leukocytosis  WBCs 14.8 on 1/14  UA with rare bacteria, urine culture ordered   Chest x-ray reviewed with patient,  unremarkable for acute intracranial process  Afebrile  Continue to monitor 18. Peripheral edema  Lasix 20 daily started on 1/14  19.  Sleep disturbance  Trazodone as needed, educated on medication as well as benefits of rest  >35 minutes spent with patient and daughter, with >30 minutes in counseling regarding edema, back pain, chemotherapy/radiation, sleep, fatigue, ambulation, medications, breathing, etc.  LOS: 7 days A FACE TO FACE EVALUATION WAS PERFORMED  Tylia Ewell [name] 07/06/2018, 8:43 AM

## 2018-07-06 NOTE — Progress Notes (Signed)
Physical Therapy Session Note  Patient Details  Name: Brian [name] MRN: [id number] Date of Birth: 1946-02-28  Today's Date: 07/06/2018 PT Individual Time: [phone] PT Individual Time Calculation (min): 60 min   Short Term Goals: Week 1:  PT Short Term Goal 1 (Week 1): pt will move supine> sit with supervision PT Short Term Goal 2 (Week 1): pt will perform basic transfer with +1 consistently PT Short Term Goal 3 (Week 1): pt will propel w/c x 100' PT Short Term Goal 4 (Week 1): pt will perform gait with LRAD x 35' with min assist PT Short Term Goal 5 (Week 1): pt will demonstrate knowledge of back precautions when moving in/out of bed wiht 0-1 cue  Skilled Therapeutic Interventions/Progress Updates:    Pt supine in bed upon PT arrival, agreeable to therapy tx and reports pain Pt transferred from supine>sidelying>sitting with mod assist, maintaining spinal precautions. During the transfer to sitting pt reports increased pain to 10/10 in R low back. RN Felizardo Hoffmann) called to bring pain medication for the pt. Pt transferred to w/c with slideboard and min assist, increased time to complete secondary to pain. Pt transported to the gym. Pt performed slideboard transfer to the mat with min assist and verbal cues for techniques. Pt performed x 2 sit<>stands from elevated mat with mod assist and RW. In standing pt performed standing marches in place 2 x 10 for LE strengthening and activity tolerance. Pt transferred back to w/c with min assist and slideboard. Transported back to room and transferred to bed with slideboard and min assist. Transferred to supine with supervision and verbal cues for back precautions.   Therapy Documentation Precautions:  Precautions Precautions: Back Precaution Comments: re-educated on BLT, pt cont to be reminded during function Required Braces or Orthoses: Spinal Brace Spinal Brace: Thoracolumbosacral orthotic, Applied in sitting position Restrictions Weight  Bearing Restrictions: No    Therapy/Group: Individual Therapy  Netta Corrigan, PT, DPT 07/06/2018, 8:01 AM

## 2018-07-07 ENCOUNTER — Inpatient Hospital Stay (HOSPITAL_COMMUNITY): Payer: Medicare Other

## 2018-07-07 ENCOUNTER — Inpatient Hospital Stay (HOSPITAL_COMMUNITY): Payer: Medicare Other | Admitting: Occupational Therapy

## 2018-07-07 MED ORDER — HYDROMORPHONE HCL 1 MG/ML PO LIQD
1.0000 mg | ORAL | Status: DC | PRN
  Administered 2018-07-07 – 2018-07-13 (×15): 1 mg via ORAL
  Filled 2018-07-07 (×15): qty 1

## 2018-07-07 MED ORDER — HYDROCODONE-ACETAMINOPHEN 5-325 MG PO TABS
1.0000 | ORAL_TABLET | Freq: Four times a day (QID) | ORAL | Status: DC | PRN
  Administered 2018-07-07 – 2018-07-08 (×5): 2 via ORAL
  Filled 2018-07-07 (×5): qty 2

## 2018-07-07 MED ORDER — HYDROCODONE-ACETAMINOPHEN 7.5-325 MG PO TABS
1.0000 | ORAL_TABLET | Freq: Four times a day (QID) | ORAL | Status: DC | PRN
  Filled 2018-07-07: qty 2

## 2018-07-07 MED ORDER — ACETAMINOPHEN 325 MG PO TABS: 325.0000 mg | ORAL_TABLET | Freq: Four times a day (QID) | ORAL | Status: DC | PRN

## 2018-07-07 MED ORDER — FUROSEMIDE 20 MG PO TABS
20.0000 mg | ORAL_TABLET | Freq: Every day | ORAL | Status: DC
  Administered 2018-07-07 – 2018-07-13 (×7): 20 mg via ORAL
  Filled 2018-07-07 (×7): qty 1

## 2018-07-07 MED ORDER — CARVEDILOL 6.25 MG PO TABS
6.2500 mg | ORAL_TABLET | Freq: Two times a day (BID) | ORAL | Status: DC
  Administered 2018-07-07 – 2018-07-13 (×12): 6.25 mg via ORAL
  Filled 2018-07-07 (×13): qty 1

## 2018-07-07 MED ORDER — TRAZODONE HCL 50 MG PO TABS
50.0000 mg | ORAL_TABLET | Freq: Every day | ORAL | Status: DC
  Administered 2018-07-07 – 2018-07-08 (×2): 50 mg via ORAL
  Filled 2018-07-07 (×2): qty 1

## 2018-07-07 NOTE — Progress Notes (Addendum)
Physical Therapy Weekly Progress Note  Patient Details  Name: Brian [name] MRN: [id number] Date of Birth: [birth year]  Beginning of progress report period: 06/30/18 End of progress report period: 07/07/18  Today's Date: 07/07/2018 PT Individual Time: 1300-1400 and 1515-1525 PT Individual Time Calculation (min): 60 min , 10 min  Patient has met 2 of 5 short term goals.  He is progressing in transfers with slide board with +1.    Patient continues to demonstrate the following deficits muscle weakness and muscle joint tightness, decreased cardiorespiratoy endurance and decreased oxygen support, decreased motor planning, decreased problem solving, decreased safety awareness, decreased memory and delayed processing and decreased sitting balance, decreased standing balance, decreased postural control and decreased balance strategies and therefore will continue to benefit from skilled PT intervention to increase functional independence with mobility.  Patient progressing toward long term goals..  Plan of care revisions: LTGs dowgraded due to inconsistent performance due to pain, urticaria, fatigue.  PT Short Term Goals Week 1:  PT Short Term Goal 1 (Week 1): pt will move supine> sit with supervision PT Short Term Goal 1 - Progress (Week 1): Not met PT Short Term Goal 2 (Week 1): pt will perform basic transfer with +1 consistently PT Short Term Goal 2 - Progress (Week 1): Progressing toward goal PT Short Term Goal 3 (Week 1): pt will propel w/c x 100' PT Short Term Goal 3 - Progress (Week 1): Met PT Short Term Goal 4 (Week 1): pt will perform gait with LRAD x 35' with min assist PT Short Term Goal 4 - Progress (Week 1): Met PT Short Term Goal 5 (Week 1): pt will demonstrate knowledge of back precautions when moving in/out of bed wiht 0-1 cue PT Short Term Goal 5 - Progress (Week 1): Not met  Week 2:  1. Pt will perform level slide board tranfers with min assist consistently. 2. Pt will move  supine> sit with tactile and VCs, and bed features. 3. Pt will state at least 1/3 back precautions. 4. Pt will direct helper in donning his TLSO with 1 reminder cue.  Skilled Therapeutic Interventions/Progress Updates:  tx 1:  Pt resting in bed, dozing.  He opened his eyes and stated that his R shoulder hurt, his low back hurt and his R LE hurts with movement. Pt rated pain 5/10, medicated during tx. R shoulder pain appears to be musculoskeletal; pt was open to the use of ice.  BP recline in bed 115/71; HR 110; O2 sats 90%.  Pt breathed deeply several times and O2 sats rose to 92%.    bil feet and lower legs more edematous since this PT's last tx 1/13.  PT donned bil TEDS.  Pt unable to remember any of his back precautions.  PT educated pt on them.    Supine> sit using bed features with min assist.  Pt had sharp pain R buttock/pelvis with lifting movement of RLE.  PT assist iwht moving RLE laterally on bed, which was not painful.  Upon sitting, pt able to scoot forward with mod assist.  PT donned TLSO.  He was unable to state if he was dizzy or not, stating "How can I not even describe how I feel?"  O2 sats 96% in sitting.   Slide board transfer, level, bed> w/c to R with mod assist.  PT discouraged pt from using RUE to press up as it increased pain.  bil LEs elevated on ELRs.  PT applied ice pack to R anterior shoulder.  PT encourage pt to get out of room for change of scenery.  PT pushed w/c to day room with family attending.  PT informed family she would return around 3:15 to assist pt back to bed.  Pt left resting in w/c, with wife with him.  tx 2:  Pt sitting up in w/c, with wife having pushed him back to room in w/c.  Pt's back itching badly, very distracting.  Pt stated that ice was helpful for his shoulder; PT removed.    He rated pain 3/10 low back.  He was uncomfortable and ready to return to bed.  Mod assist level slide board transfer to return to bed.  Alarm set and needs at  hand.    Awaiting consult from Oncology MD regarding pt's status and what direction CIR should take in POC, family ed and d/c planning.    Therapy Documentation Precautions:  Precautions Precautions: Back Precaution Comments: re-educated on BLT, pt cont to be reminded during function Required Braces or Orthoses: Spinal Brace Spinal Brace: Thoracolumbosacral orthotic, Applied in sitting position Restrictions Weight Bearing Restrictions: No   Therapy/Group: Individual Therapy  [employer] 07/07/2018, 4:59 PM

## 2018-07-07 NOTE — Progress Notes (Signed)
Occupational Therapy Session Note  Patient Details  Name: Brian [name] MRN: [id number] Date of Birth: Nov 24, 1945  Today's Date: 07/07/2018 OT Individual Time: 5188-4166 OT Individual Time Calculation (min): 70 min    Short Term Goals: Week 1:  OT Short Term Goal 1 (Week 1): Pt will complete transfer to toillet/BSC with mod A using LRAD OT Short Term Goal 2 (Week 1): Pt will thread B LEs into pants with steadying assisting using AE PRN OT Short Term Goal 3 (Week 1): Pt will tolerate standing at sink to complete 1 grooming task in standing with steadying assist  Skilled Therapeutic Interventions/Progress Updates:    Pt seen for OT session focusing on funcitonal mobility, ADL re-training and pain management. Pt in supine upon arrival with daughter present. Pt voicing pain 5/10 in lower back, reports having received pain meds 20 min prior to therapist's arrival. Agreeable to cont with therapy as able. With mobility, pain increasing to 9/10 in R lower back described as "pinching" and feeling unstable. He transferred to sitting EOB with mod A using hospital bed functions and VCs for technique. TLSO donned by therapist EOB. Plan for pt to take shower per request as pt cont to be very "itchy" all over back and abdomen. Attempted to 4 for sit>stand into STEDY from elevated EOB, however, pt unable to come into erect standing posture, limited by pain and fatigue. Fearful to get pt into shower as unsure he would have strength to stand back into STEDY to transfer out. Pt then declining transfer from bed and requesting to return to bed as awaiting to go down to x-ray.  With +2 assist, stood into STEDY in order for new brief and shorts to be donned, pericare/buttock hygiene completed and bed sheets to be changed. Pt tolerating standing ~2 minutes. He completed UB bathing/dressing seated EOB with set-up/supervision. Returned to supine. Attempted to position pt in side-lying for comfort, however, unable to  tolerate. Pt left in supine with heat pack applied to R shoulder per request.  Pt cont to be limited in therapy by lower back pain and itching all over abdomen/back.  PA made aware of request for k-pad for comfort.   Therapy Documentation Precautions:  Precautions Precautions: Back Precaution Comments: re-educated on BLT, pt cont to be reminded during function Required Braces or Orthoses: Spinal Brace Spinal Brace: Thoracolumbosacral orthotic, Applied in sitting position Restrictions Weight Bearing Restrictions: No   Therapy/Group: Individual Therapy  Bhavin Monjaraz L 07/07/2018, 7:01 AM

## 2018-07-07 NOTE — Progress Notes (Signed)
[place] PHYSICAL MEDICINE & REHABILITATION PROGRESS NOTE  Subjective/Complaints: Patient seen lying in bed this morning.  He states he slept fairly overnight.  He did not take his sleep medications because he states he was confused.  Discussed again with patient and daughter.  Also discussed back pain.  ROS: + Back pain, sleep disturbance.  Denies chest pain, shortness of breath, nausea, vomiting, diarrhea.  Objective: Vital Signs: Blood pressure 102/69, pulse (!) 114, temperature 99 F (37.2 C), temperature source Oral, resp. rate 18, height 5\' 10"  (1.778 m), weight 94.7 kg, SpO2 90 %. Dg Chest 2 View  Result Date: 07/05/2018 CLINICAL DATA:  Chest congestion, cough, leukocytosis EXAM: CHEST - 2 VIEW COMPARISON:  06/13/2018 FINDINGS: Rounded right lower lobe masses again noted as seen on prior chest CT. Mild cardiomegaly. Linear scarring in the left base. No effusions or acute bony abnormality. IMPRESSION: Right lower lobe mass again noted. Mild cardiomegaly. Electronically Signed   By: Rolm Baptise M.D.   On: 07/05/2018 17:02   Recent Labs    07/05/18 0645 07/06/18 0527  WBC 13.5* 14.8*  HGB 9.4* 9.5*  HCT 30.1* 30.9*  PLT 422* 440*   Recent Labs    07/05/18 0645 07/06/18 0527  NA 133* 138  K 2.9* 3.9  CL 104 108  CO2 21* 24  GLUCOSE 116* 109*  BUN 9 10  CREATININE 0.67 0.71  CALCIUM 7.2* 7.5*    Physical Exam: BP 102/69 (BP Location: Right Arm)   Pulse (!) 114   Temp 99 F (37.2 C) (Oral)   Resp 18   Ht 5\' 10"  (1.778 m)   Wt 94.7 kg   SpO2 90%   BMI 29.96 kg/m  Constitutional: No distress . Vital signs reviewed. HENT: Normocephalic.  Atraumatic. Eyes: EOMI. No discharge. Cardiovascular: + Tachycardia.  Regular rhythm.  No JVD. Respiratory: CTA bilaterally.  Normal effort. GI: BS +. Non-distended. Musculoskeletal: Bilateral lower extremity edema, left greater than right, increased Neurological: He is alert and oriented  Dysphonia  Able to follow  basic commands without difficulty.  Motor: Bilateral UE 5/5 proximal to distal, stable Right lower extremity: 4-/5 proximal distal, stable Left lower extremity: Hip flexion 3-/5, knee extension 4-/5, ankle dorsiflexion 4/5, stable Skin: Rash improving Psychiatric: His behavior is normal.Judgmentand thought contentnormal. His affect is blunt.  Assessment/Plan: 1. Functional deficits secondary to lung cancer with mets to ribs, vertebrae with oncological fracture status post lumbar fusion which require 3+ hours per day of interdisciplinary therapy in a comprehensive inpatient rehab setting.  Physiatrist is providing close team supervision and 24 hour management of active medical problems listed below.  Physiatrist and rehab team continue to assess barriers to discharge/monitor patient progress toward functional and medical goals  Care Tool:  Bathing  Bathing activity did not occur: Safety/medical concerns(Unable to assess 2/2 drop in BP with upright positioning) Body parts bathed by patient: Right arm, Left arm, Chest, Face, Abdomen, Front perineal area, Buttocks, Right upper leg, Left upper leg   Body parts bathed by helper: Right lower leg, Left lower leg     Bathing assist Assist Level: Minimal Assistance - Patient > 75%     Upper Body Dressing/Undressing Upper body dressing Upper body dressing/undressing activity did not occur (including orthotics): Safety/medical concerns(B/P issues ) What is the patient wearing?: Pull over shirt, Orthosis Orthosis activity level: Performed by helper  Upper body assist Assist Level: Set up assist    Lower Body Dressing/Undressing Lower body dressing  What is the patient wearing?: Pants     Lower body assist Assist for lower body dressing: Minimal Assistance - Patient > 75%     Toileting Toileting Toileting Activity did not occur (Clothing management and hygiene only): Safety/medical concerns(Unable to assess 2/2 drop in BP with  upright positioning)  Toileting assist Assist for toileting: Moderate Assistance - Patient 50 - 74%(While standing in ST[place])     Transfers Chair/bed transfer  Transfers assist     Chair/bed transfer assist level: Minimal Assistance - Patient > 75%(slide board, level)     Locomotion Ambulation   Ambulation assist   Ambulation activity did not occur: Safety/medical concerns  Assist level: Minimal Assistance - Patient > 75%(w/c follow for safety) Assistive device: Walker-rolling Max distance: 18   Walk 10 feet activity   Assist     Assist level: Minimal Assistance - Patient > 75% Assistive device: Walker-rolling   Walk 50 feet activity   Assist           Walk 150 feet activity   Assist           Walk 10 feet on uneven surface  activity   Assist           Wheelchair     Assist Will patient use wheelchair at discharge?: Yes Type of Wheelchair: Manual Wheelchair activity did not occur: Safety/medical concerns(hypotensive)  Wheelchair assist level: Supervision/Verbal cueing Max wheelchair distance: 75    Wheelchair 50 feet with 2 turns activity    Assist    Wheelchair 50 feet with 2 turns activity did not occur: Safety/medical concerns   Assist Level: Supervision/Verbal cueing   Wheelchair 150 feet activity     Assist Wheelchair 150 feet activity did not occur: Safety/medical concerns   Assist Level: Supervision/Verbal cueing      Medical Problem List and Plan: 1.Functional and mobility deficitssecondary to metastatic lung cancer to left clavicle, ribs, L4 vertebral body with fracture. Pt is s/p L4 corpectomy and L3-5 fusion by Dr. Rolena Infante on 06/11/18  Continue CIR 2. DVT: dopplers done this evening and patient has DVT in left CFV             IVC filter placement on 1/8  Pt not a candidate for anticoagulation due to hematoma and bleeding riskat present although long term he is high VTE risk 3. Pain  Management:Continue Fentanyl 12.5 mcg with norco/dilaudid prn. Unable to tolerate Oxycodone. Monitor patient's pain with increased activity.  Will consider increasing fentanyl tomorrow  Appreciate palliative care consult- notes reviewed- Robaxin frequency increased 4. Mood:LCSW to follow for evaluation and support. 5. Neuropsych: This patientiscapable of making decisions on hisown behalf. 6. Skin/Wound Care:Monitor wound for healing. Has been refusing ensure--Added prostat to help promote wound healing. 7. Fluids/Electrolytes/Nutrition:Monitor I/O.  8.Blood pressure; Intermittent elevation noted   Orthostatic vital signs ordered on 1/10, remains pending on 1/15  Abdominal binder as needed  Monitor with increased mobility 9. ABLA: Continue to monitor with serial checks.   Hemoglobin 9.5 on 1/14 10. Hypokalemia:   Potassium 3.9 on 1/14  Labs ordered for tomorrow   Supplement increased on 1/13  Magnesium 1.6 on 1/14   Supplement initiated on 1/14 11. Rash: Continue Clobetasol bidas well as hypoallergenic sheets. 12. Neuropathic pain: Improving on gabapentin 300 mg tid 13. Constipation:Managed with miralax prn--patient wants to manage his bowel program. 14.  Hypoalbuminemia  Supplement initiated on 1/8 15.  Sinus tachycardia  Metoprolol 12.5 twice daily started on 1/13, changed to Coreg on  1/15 16.  Hyponatremia  Sodium 133 on 1/13  Labs ordered for tomorrow  Continue to monitor 17.  Leukocytosis  WBCs 14.8 on 1/14  UA with rare bacteria, urine culture pending   Chest x-ray reviewed with patient, unremarkable for acute intracranial process  Afebrile  Continue to monitor 18. Peripheral edema  Lasix 20 daily started on 1/15  19.  Sleep disturbance  Trazodone as needed, educated on medication as well as benefits of rest, will schedule  LOS: 8 days A FACE TO FACE EVALUATION WAS PERFORMED  Anntoinette Haefele [name] 07/07/2018, 8:54 AM

## 2018-07-07 NOTE — Progress Notes (Signed)
Daily Progress Note   Patient Name: Brian [name]       Date: 07/07/2018 DOB: 06-14-1946  Age: 73 y.o. MRN#: [id number] Attending Physician: Jamse Arn, MD Primary Care Physician: [name], MD Admit Date: 06/29/2018  Reason for Consultation/Follow-up: Pain management  Subjective: Patient is resting in bed. He opens his eyes briefly on entry and closed them. He states his pain is controlled at rest. He has had pain with movement that he states the Norco and Robaxin are not optimally treating. Norco dosing changed. Updated family that per communication with Dr. Elnoria Howard, he will come to see the patient tomorrow.     Length of Stay: 8  Current Medications: Scheduled Meds:  . carvedilol  6.25 mg Oral BID WC  . clobetasol ointment   Topical BID  . feeding supplement (PRO-STAT SUGAR FREE 64)  30 mL Oral BID  . fentaNYL  1 patch Transdermal Q72H  . furosemide  20 mg Oral Daily  . gabapentin  300 mg Oral TID  . guaiFENesin  10 mL Oral Q6H  . magnesium oxide  200 mg Oral Daily  . pantoprazole  40 mg Oral Daily  . potassium chloride  30 mEq Oral BID  . senna  1 tablet Oral QHS  . traZODone  50 mg Oral QHS    Continuous Infusions:   PRN Meds: acetaminophen, albuterol, alum & mag hydroxide-simeth, bisacodyl, camphor-menthol, diphenhydrAMINE, guaiFENesin-dextromethorphan, HYDROcodone-acetaminophen, HYDROmorphone HCl, methocarbamol, polyethylene glycol, prochlorperazine **OR** prochlorperazine **OR** prochlorperazine, sodium phosphate  Physical Exam Pulmonary:     Effort: Pulmonary effort is normal.  Skin:    General: Skin is warm and dry.     Findings: Rash present.             Vital Signs: BP 102/69 (BP Location: Right Arm)   Pulse (!) 114   Temp 99 F (37.2 C)  (Oral)   Resp 18   Ht 5\' 10"  (1.778 m)   Wt 94.7 kg   SpO2 90%   BMI 29.96 kg/m  SpO2: SpO2: 90 % O2 Device: O2 Device: Room Air O2 Flow Rate: O2 Flow Rate (L/min): 2 L/min  Intake/output summary:   Intake/Output Summary (Last 24 hours) at 07/07/2018 1320 Last data filed at 07/07/2018 1300 Gross per 24 hour  Intake 840 ml  Output 525 ml  Net 315 ml   LBM: Last BM Date: 07/05/18 Baseline Weight: Weight: 96.8 kg Most recent weight: Weight: 94.7 kg       Palliative Assessment/Data: 40%      Patient Active Problem List   Diagnosis Date Noted  . Sleep disturbance   . Peripheral edema   . Leukocytosis   . Hyponatremia   . Sinus tachycardia   . Hypomagnesemia   . Orthostatic hypotension   . Labile blood pressure   . Cancer associated pain   . DVT of lower extremity (deep venous thrombosis) ([place])   . Malignant neoplasm of lung ([place])   . Hypoalbuminemia due to protein-calorie malnutrition ([place])   . Acute blood loss anemia   . Postoperative pain   . Pathological compression fracture of lumbar vertebra ([place]) 06/29/2018  . Metastatic lung cancer (metastasis from lung to other site) ([place]) 06/28/2018  . Rash 06/25/2018  . Hypokalemia 06/25/2018  . Normocytic anemia 06/25/2018  . S/P lumbar fusion 06/24/2018  . Palliative care by specialist   . Cancer related pain   . Lung cancer, primary, with metastasis from lung to other site, right ([place]) 06/14/2018  . Squamous cell lung cancer, right ([place]) 06/14/2018  . Lung cancer metastatic to bone ([place]) 06/14/2018  . Bronchitis due to tobacco use 06/07/2018  . Lumbar spine tumor 06/04/2018    Palliative Care Assessment & Plan   Recommendations/Plan:  Will continue to follow.  Pain at rest is controlled. Pain with movement is suboptimally controlled.  Continue current Fentanyl patch for basal pain control.  Tylenol for mild pain. 325mg  q 6 hours with maximum daily allowance of 4,000mg  from all sources.   Norco 1 tablet for  moderate pain, 2 for severe pain. Changed to 5-10mg  dosing from 7.5mg  dosing.  Recommend Dilaudid for breakthrough pain.  Recommend Robaxin for muscle spasms. Ordered for q 5 hours PRN dosing to allow 1 extra dose in 24 hour period.  Recommend increasing Neurontin as needed for neuropathic pain.   Code Status:    Code Status Orders  (From admission, onward)         Start     Ordered   06/29/18 1526  Full code  Continuous     06/29/18 1525        Code Status History    Date Active Date Inactive Code Status Order ID Comments User Context   06/11/2018 2031 06/29/2018 1440 Full Code [id number]  Milagros Loll, MD Inpatient   06/11/2018 2011 06/11/2018 2030 Full Code [id number]  Melina Schools, MD Inpatient   06/04/2018 1344 06/11/2018 2011 Full Code [id number]  Ardeen Jourdain, PA-C Inpatient       Prognosis:  Poor overall. Metastatic cancer with pathologic fracture diagnosed in December.   Discharge Planning:  To Be Determined  Care plan was discussed with RN  Thank you for allowing the Palliative Medicine Team to assist in the care of this patient.   Time In: 12:50 Time Out: 1:40 Total Time 50 min Prolonged Time Billed  no      Greater than 50%  of this time was spent counseling and coordinating care related to the above assessment and plan.  Asencion Gowda, NP  Please contact Palliative Medicine Team phone at [phone] for questions and concerns.

## 2018-07-07 NOTE — Patient Care Conference (Signed)
Inpatient RehabilitationTeam Conference and Plan of Care Update Date: 07/07/2018   Time: 2:15 pm    Patient Name: Brian [name]      Medical Record Number: [id number]  Date of Birth: [birth year] Sex: Male         Room/Bed: 4M10C/4M10C-01 Payor Info: Payor: MEDICARE / Plan: MEDICARE PART A AND B / Product Type: *No Product type* /    Admitting Diagnosis: metastatic lung cancer  Admit Date/Time:  06/29/2018  2:39 PM Admission Comments: No comment available   Primary Diagnosis:  <principal problem not specified> Principal Problem: <principal problem not specified>  Patient Active Problem List   Diagnosis Date Noted  . Sleep disturbance   . Peripheral edema   . Leukocytosis   . Hyponatremia   . Sinus tachycardia   . Hypomagnesemia   . Orthostatic hypotension   . Labile blood pressure   . Cancer associated pain   . DVT of lower extremity (deep venous thrombosis) ([place])   . Malignant neoplasm of lung ([place])   . Hypoalbuminemia due to protein-calorie malnutrition ([place])   . Acute blood loss anemia   . Postoperative pain   . Pathological compression fracture of lumbar vertebra ([place]) 06/29/2018  . Metastatic lung cancer (metastasis from lung to other site) ([place]) 06/28/2018  . Rash 06/25/2018  . Hypokalemia 06/25/2018  . Normocytic anemia 06/25/2018  . S/P lumbar fusion 06/24/2018  . Palliative care by specialist   . Cancer related pain   . Lung cancer, primary, with metastasis from lung to other site, right ([place]) 06/14/2018  . Squamous cell lung cancer, right ([place]) 06/14/2018  . Lung cancer metastatic to bone ([place]) 06/14/2018  . Bronchitis due to tobacco use 06/07/2018  . Lumbar spine tumor 06/04/2018    Expected Discharge Date:    Team Members Present: Physician leading conference: Dr. Delice Lesch Social Worker Present: Ovidio Kin, LCSW Nurse Present: Frances Maywood, RN PT Present: Georjean Mode, PT OT Present: Amy Rounds, OT SLP Present: Windell Moulding, SLP PPS  Coordinator present : Gunnar Fusi     Current Status/Progress Goal Weekly Team Focus  Medical   Functional and mobility deficits secondary to metastatic lung cancer to left clavicle, ribs, L4 vertebral body with fracture. Pt is s/p L4 corpectomy and L3-5 fusion by Dr. Rolena Infante on 06/11/18  Improve mobility, pain, DVT, potassium, magnesium, HR, sleep, edema  See above   Bowel/Bladder   continent of bowel and bladder, LBM 07-05-18  remain continent of bowel and bladder,maintain regular bowel pattern  Assist with toileting needs prn   Swallow/Nutrition/ Hydration   mod assist bed and slide board transfers; gait on 1/13: sit> stand +2, once standing, gait x 18' RW wiht min assist  downgraded: bed min assist, transfers min assist, car transfers mod assist, w/c x 150' supervision; sit> stand and gait goals discharged  bed mobility, transfers,  pt and family ed, activity tolerance, w/c propulsion, pain mgt; awaiting information from Oncology   ADL's   mod assist slide board transfer, level; w/c supervision 150', sit> stand +2, ambulation 1/13 18' min assist  downgraded due to inconsistent performance; gait and steps discharged: bed mobility, transfers min assist; car transfer min assist; propel w/c 150' supervision  knowledge of back precautions, mobility, w/c locomotion, activity tolerance   Mobility             Communication             Safety/Cognition/ Behavioral Observations  Pain   pain managed with prn dilaudid, norco, robaxin  pain <4  Assess pain q shift am=nd prn   Skin   back incision healing, rash to back flank and leg patient's wife refusing temovate cream  maintain skin integrity and prevent skin breakdown  Assess skin q shift and prn      *See Care Plan and progress notes for long and short-term goals.     Barriers to Discharge  Current Status/Progress Possible Resolutions Date Resolved   Physician    Medical stability;Other (comments);Pending chemo/radiation   DVT  See above  Therapies, IVC filter, optimize pain meds, supplement K+/Mag, optimize HR meds, follow labs      Nursing                  PT                    OT                  SLP                SW                Discharge Planning/Teaching Needs:  HOme with wife who can provide assist and is here daily and participating in therapies. Pt wants to be as independent as possible due to does not like asking for help      Team Discussion:  Declining in his function since last week-now mod/max level with sliding board or lift. Pain and itching issues which limit him in therapies. MD adjusting pain meds and placing on benedryl. Edema worse in his lower extremities. X-ray of back today. K-pad ordered. Awaiting to see oncologist tomorrow.  Revisions to Treatment Plan:  Unable to set DC date due to decline    Continued Need for Acute Rehabilitation Level of Care: The patient requires daily medical management by a physician with specialized training in physical medicine and rehabilitation for the following conditions: Daily direction of a multidisciplinary physical rehabilitation program to ensure safe treatment while eliciting the highest outcome that is of practical value to the patient.: Yes Daily medical management of patient stability for increased activity during participation in an intensive rehabilitation regime.: Yes Daily analysis of laboratory values and/or radiology reports with any subsequent need for medication adjustment of medical intervention for : Post surgical problems;Other;Cardiac problems   I attest that I was present, lead the team conference, and concur with the assessment and plan of the team.   Elease Hashimoto 07/08/2018, 3:13 PM

## 2018-07-07 NOTE — Progress Notes (Signed)
Social Work Patient ID: Brian [name], male   DOB: 1945-07-28, 73 y.o.   MRN: [id number] Met with pt and wife to discuss team conference and the need for some insight from the oncologist who is suppose to come tomorrow to talk with pt and wife. Pt has declined in function now currently mod-max level of assist. His itching and pain is limiting him in therapies. Will touch base with them tomorrow and continue to work on discharge plans. Wife and daughter are here daily.

## 2018-07-08 ENCOUNTER — Inpatient Hospital Stay (HOSPITAL_COMMUNITY): Payer: Medicare Other

## 2018-07-08 ENCOUNTER — Inpatient Hospital Stay (HOSPITAL_COMMUNITY): Payer: Medicare Other | Admitting: Physical Therapy

## 2018-07-08 ENCOUNTER — Inpatient Hospital Stay (HOSPITAL_COMMUNITY): Payer: Medicare Other | Admitting: Occupational Therapy

## 2018-07-08 DIAGNOSIS — D649 Anemia, unspecified: Secondary | ICD-10-CM

## 2018-07-08 DIAGNOSIS — R5081 Fever presenting with conditions classified elsewhere: Secondary | ICD-10-CM

## 2018-07-08 DIAGNOSIS — C3431 Malignant neoplasm of lower lobe, right bronchus or lung: Secondary | ICD-10-CM

## 2018-07-08 LAB — CBC WITH DIFFERENTIAL/PLATELET
Abs Immature Granulocytes: 0.32 10*3/uL — ABNORMAL HIGH (ref 0.00–0.07)
Abs Immature Granulocytes: 0.31 10*3/uL — ABNORMAL HIGH (ref 0.00–0.07)
BASOS PCT: 1 %
Basophils Absolute: 0.1 10*3/uL (ref 0.0–0.1)
Basophils Absolute: 0.1 10*3/uL (ref 0.0–0.1)
Basophils Relative: 1 %
EOS PCT: 9 %
Eosinophils Absolute: 1.1 10*3/uL — ABNORMAL HIGH (ref 0.0–0.5)
Eosinophils Absolute: 0.7 10*3/uL — ABNORMAL HIGH (ref 0.0–0.5)
Eosinophils Relative: 5 %
HCT: 30.4 % — ABNORMAL LOW (ref 39.0–52.0)
HCT: 29.4 % — ABNORMAL LOW (ref 39.0–52.0)
Hemoglobin: 9.5 g/dL — ABNORMAL LOW (ref 13.0–17.0)
Hemoglobin: 8.9 g/dL — ABNORMAL LOW (ref 13.0–17.0)
Immature Granulocytes: 3 %
Immature Granulocytes: 2 %
Lymphocytes Relative: 17 %
Lymphocytes Relative: 16 %
Lymphs Abs: 2.2 10*3/uL (ref 0.7–4.0)
Lymphs Abs: 2.1 10*3/uL (ref 0.7–4.0)
MCH: 29 pg (ref 26.0–34.0)
MCH: 27.8 pg (ref 26.0–34.0)
MCHC: 31.3 g/dL (ref 30.0–36.0)
MCHC: 30.3 g/dL (ref 30.0–36.0)
MCV: 92.7 fL (ref 80.0–100.0)
MCV: 91.9 fL (ref 80.0–100.0)
Monocytes Absolute: 1 10*3/uL (ref 0.1–1.0)
Monocytes Absolute: 1.1 10*3/uL — ABNORMAL HIGH (ref 0.1–1.0)
Monocytes Relative: 8 %
Monocytes Relative: 9 %
NRBC: 0 % (ref 0.0–0.2)
Neutro Abs: 7.8 10*3/uL — ABNORMAL HIGH (ref 1.7–7.7)
Neutro Abs: 9 10*3/uL — ABNORMAL HIGH (ref 1.7–7.7)
Neutrophils Relative %: 62 %
Neutrophils Relative %: 67 %
Platelets: 451 10*3/uL — ABNORMAL HIGH (ref 150–400)
Platelets: 411 10*3/uL — ABNORMAL HIGH (ref 150–400)
RBC: 3.28 MIL/uL — ABNORMAL LOW (ref 4.22–5.81)
RBC: 3.2 MIL/uL — ABNORMAL LOW (ref 4.22–5.81)
RDW: 16 % — AB (ref 11.5–15.5)
RDW: 15.8 % — ABNORMAL HIGH (ref 11.5–15.5)
WBC: 12.6 10*3/uL — ABNORMAL HIGH (ref 4.0–10.5)
WBC: 13.3 10*3/uL — ABNORMAL HIGH (ref 4.0–10.5)
nRBC: 0 % (ref 0.0–0.2)

## 2018-07-08 LAB — PREALBUMIN: Prealbumin: 6.8 mg/dL — ABNORMAL LOW (ref 18–38)

## 2018-07-08 LAB — BASIC METABOLIC PANEL
Anion gap: 8 (ref 5–15)
BUN: 9 mg/dL (ref 8–23)
CALCIUM: 7.5 mg/dL — AB (ref 8.9–10.3)
CO2: 24 mmol/L (ref 22–32)
Chloride: 103 mmol/L (ref 98–111)
Creatinine, Ser: 0.63 mg/dL (ref 0.61–1.24)
GFR calc Af Amer: >60 mL/min (ref >60)
Glucose, Bld: 104 mg/dL — ABNORMAL HIGH (ref 70–99)
Potassium: 3.7 mmol/L (ref 3.5–5.1)
Sodium: 135 mmol/L (ref 135–145)

## 2018-07-08 MED ORDER — HYDROCODONE-ACETAMINOPHEN 7.5-325 MG PO TABS
1.0000 | ORAL_TABLET | ORAL | Status: DC | PRN
  Administered 2018-07-09 – 2018-07-13 (×16): 1 via ORAL
  Filled 2018-07-08 (×16): qty 1

## 2018-07-08 MED ORDER — CEPHALEXIN 250 MG PO CAPS
500.0000 mg | ORAL_CAPSULE | Freq: Four times a day (QID) | ORAL | Status: DC
  Administered 2018-07-08 – 2018-07-09 (×6): 500 mg via ORAL
  Filled 2018-07-08 (×7): qty 2

## 2018-07-08 MED ORDER — HYDROCODONE-ACETAMINOPHEN 5-325 MG PO TABS: 2.0000 | ORAL_TABLET | ORAL | Status: DC | PRN

## 2018-07-08 MED ORDER — HYDROCODONE-ACETAMINOPHEN 10-325 MG PO TABS: 1.0000 | ORAL_TABLET | Freq: Every day | ORAL | Status: DC

## 2018-07-08 MED ORDER — SENNA 8.6 MG PO TABS
2.0000 | ORAL_TABLET | Freq: Every day | ORAL | Status: DC
  Administered 2018-07-08 – 2018-07-12 (×5): 17.2 mg via ORAL
  Filled 2018-07-08 (×5): qty 2

## 2018-07-08 MED ORDER — HYDROCODONE-ACETAMINOPHEN 7.5-325 MG PO TABS
1.0000 | ORAL_TABLET | Freq: Every day | ORAL | Status: DC
  Administered 2018-07-08 – 2018-07-12 (×5): 1 via ORAL
  Filled 2018-07-08 (×5): qty 1

## 2018-07-08 MED ORDER — LIDOCAINE 5 % EX PTCH
1.0000 | MEDICATED_PATCH | CUTANEOUS | Status: DC
  Administered 2018-07-08: 1 via TRANSDERMAL
  Filled 2018-07-08: qty 1

## 2018-07-08 MED ORDER — METHOCARBAMOL 750 MG PO TABS
750.0000 mg | ORAL_TABLET | Freq: Four times a day (QID) | ORAL | Status: DC
  Administered 2018-07-08 – 2018-07-13 (×17): 750 mg via ORAL
  Filled 2018-07-08 (×17): qty 1

## 2018-07-08 NOTE — Progress Notes (Signed)
Daily Progress Note   Patient Name: Brian [name]       Date: 07/08/2018 DOB: 1945/06/28  Age: 73 y.o. MRN#: [id number] Attending Physician: Jamse Arn, MD Primary Care Physician: [name], MD Admit Date: 06/29/2018  Reason for Consultation/Follow-up: Pain management  Subjective: Patient is resting in bed with wife and daughter at bedside. He states his pain is a 3/4 while laying in bed. With movement it can be severe. Upon further discussion, and discussion of mild, moderate, and severe pain, it was determined his pain may be worse that what he is indicating upon assessment. We discussed increasing the Fentanyl patch as it is now understood his pain is moderate at rest. Primary team came in to discuss work up given his fever, pain, and surgical site.   Would recommend increasing Fentanyl patch. Would then recommend optimizing Norco as it will provide a more even release, and using oral Dilaudid for breakthrough pain.       Length of Stay: 9  Current Medications: Scheduled Meds:  . carvedilol  6.25 mg Oral BID WC  . cephALEXin  500 mg Oral Q6H  . clobetasol ointment   Topical BID  . feeding supplement (PRO-STAT SUGAR FREE 64)  30 mL Oral BID  . fentaNYL  1 patch Transdermal Q72H  . furosemide  20 mg Oral Daily  . gabapentin  300 mg Oral TID  . guaiFENesin  10 mL Oral Q6H  . lidocaine  1 patch Transdermal Q24H  . magnesium oxide  200 mg Oral Daily  . pantoprazole  40 mg Oral Daily  . potassium chloride  30 mEq Oral BID  . senna  1 tablet Oral QHS  . traZODone  50 mg Oral QHS    Continuous Infusions:   PRN Meds: acetaminophen, albuterol, alum & mag hydroxide-simeth, bisacodyl, camphor-menthol, diphenhydrAMINE, guaiFENesin-dextromethorphan,  HYDROcodone-acetaminophen, HYDROmorphone HCl, methocarbamol, polyethylene glycol, prochlorperazine **OR** prochlorperazine **OR** prochlorperazine, sodium phosphate  Physical Exam Pulmonary:     Effort: Pulmonary effort is normal.  Skin:    General: Skin is warm and dry.     Findings: Rash present.             Vital Signs: BP 103/67 (BP Location: Right Arm)   Pulse (!) 102   Temp (!) 97.5 F (36.4 C)   Resp 16  Ht 5\' 10"  (1.778 m)   Wt 94.4 kg   SpO2 (!) 88%   BMI 29.86 kg/m  SpO2: SpO2: (!) 88 % O2 Device: O2 Device: Room Air O2 Flow Rate: O2 Flow Rate (L/min): 2 L/min  Intake/output summary:   Intake/Output Summary (Last 24 hours) at 07/08/2018 1507 Last data filed at 07/08/2018 1353 Gross per 24 hour  Intake 540 ml  Output 725 ml  Net -185 ml   LBM: Last BM Date: 07/05/18 Baseline Weight: Weight: 96.8 kg Most recent weight: Weight: 94.4 kg       Palliative Assessment/Data: 40%      Patient Active Problem List   Diagnosis Date Noted  . Sleep disturbance   . Peripheral edema   . Leukocytosis   . Hyponatremia   . Sinus tachycardia   . Hypomagnesemia   . Orthostatic hypotension   . Labile blood pressure   . Cancer associated pain   . DVT of lower extremity (deep venous thrombosis) ([place])   . Malignant neoplasm of lung ([place])   . Hypoalbuminemia due to protein-calorie malnutrition ([place])   . Acute blood loss anemia   . Postoperative pain   . Pathological compression fracture of lumbar vertebra ([place]) 06/29/2018  . Metastatic lung cancer (metastasis from lung to other site) ([place]) 06/28/2018  . Rash 06/25/2018  . Hypokalemia 06/25/2018  . Normocytic anemia 06/25/2018  . S/P lumbar fusion 06/24/2018  . Palliative care by specialist   . Cancer related pain   . Lung cancer, primary, with metastasis from lung to other site, right ([place]) 06/14/2018  . Squamous cell lung cancer, right ([place]) 06/14/2018  . Lung cancer metastatic to bone ([place]) 06/14/2018  .  Bronchitis due to tobacco use 06/07/2018  . Lumbar spine tumor 06/04/2018    Palliative Care Assessment & Plan   Recommendations/Plan: Pain appears to be more severe than what has been indicated at rest, and continues to be more so with activity. Would recommend increasing the Fentanyl patch for basal pain control.  Tylenol in place for mild pain. 325mg  q 6 hours with maximum daily allowance of 4,000mg  from all sources.   Norco 1 tablet for moderate pain, 2 for severe pain.  Recommend Dilaudid for breakthrough pain.  Recommend Robaxin for muscle spasms. Ordered for q 5 hours PRN dosing to allow 1 extra dose in 24 hour period.  Recommend increasing Neurontin as needed for neuropathic pain.   Code Status:    Code Status Orders  (From admission, onward)         Start     Ordered   06/29/18 1526  Full code  Continuous     06/29/18 1525        Code Status History    Date Active Date Inactive Code Status Order ID Comments User Context   06/11/2018 2031 06/29/2018 1440 Full Code [id number]  Milagros Loll, MD Inpatient   06/11/2018 2011 06/11/2018 2030 Full Code [id number]  Melina Schools, MD Inpatient   06/04/2018 1344 06/11/2018 2011 Full Code [id number]  Ardeen Jourdain, PA-C Inpatient       Prognosis:  Poor overall. Metastatic cancer with pathologic fracture diagnosed in December.   Discharge Planning:  To Be Determined  Care plan was discussed with RN  Thank you for allowing the Palliative Medicine Team to assist in the care of this patient.   Time In: 1:50 Time Out: 3:10 Total Time 1 hr 20 min Prolonged Time Billed yes      Greater than  50%  of this time was spent counseling and coordinating care related to the above assessment and plan.  Asencion Gowda, NP  Please contact Palliative Medicine Team phone at [phone] for questions and concerns.

## 2018-07-08 NOTE — Progress Notes (Signed)
   Patient with drainage on dressing this am. Wound examined and noted to have two areas of dehiscence with few drops of serous fluid expressed. Fluid sent for culture due to reports of chills and low grade fevers. Right hip pain has been a limiting factor--will order Xray for work up.

## 2018-07-08 NOTE — Progress Notes (Signed)
I stop by to see [name].  He has been on while since I was able to see him.  Unfortunately, it looks like he is still having a very tough time.  He just is not all that mobile.  He still is having a lot of pain.  He is not eating all that much.  He had his last surgery on June 24, 2018.  I am not sure that he is yet able to have any radiation therapy to the lumbar region for his L4 involvement.  I again, I told him that we are dealing with stage IV lung cancer.  I told him that this is a cancer that we can treat but that we are not going to cure.  He wanted to know about his "survival.".  I told him that hopefully we might be able to get him a year to a year and a half.  His performance status right now is ECOG 2 at best.  He is not a good candidate for systemic chemotherapy.  I tried to explain to him how important it is for him to get nutrition in.  His last albumin was only 1.8.  This was about 8 days ago.  This really is a poor indicator for [place].  I will have to check his pre-albumin.  His white cell count today was 13.3.  Hemoglobin 8.9.  Platelet count 411,000.  The fact that he is still anemic also is somewhat concerning.  I will have to check his iron studies.  His last iron studies were done back before Christmas.  He was iron deficient at that point.  I will recheck his studies and see how they look.  Right now, I am just not all that optimistic that we are going to have a significant impact on his prognosis.  He just does not have that great of a performance status right now and I am not sure if he will be able to improve all that much given his limitations with pain.  I just feel bad for [name].  I know that he is trying.  I know that he is frustrated by the pain and the fact that he just is not able to do as much as he would like.  It would really be great if he would eat more.  I wonder if a blood transfusion may not be a bad idea for him also depending on his blood  count.  He apparently has had a fever.  The chest x-ray that was done on July 05, 2018 showed the right lower lobe mass.  There is no pneumonia.  He does have a thrombus in the left leg.  He is not felt to be a candidate for anticoagulation.  Had IVC filter placed.  This is certainly an ominous factor in my mind.  I will have to let radiation oncology know about his status and maybe they can start some palliative radiation to his clavicle and ribs.  Lattie Haw, MD  2 Corinthians 4:16-18

## 2018-07-08 NOTE — Progress Notes (Signed)
Occupational Therapy Weekly Progress Note  Patient Details  Name: Brian [name] MRN: [id number] Date of Birth: March 11, 1946  Beginning of progress report period: July 01, 2018 End of progress report period: July 08, 2018  Today's Date: 07/08/2018 OT Individual Time: 1006-1130 OT Individual Time Calculation (min): 84 min    Patient has met 1 of 3 short term goals.  Pt making very minimal gains in OT at this time. He is most limited by severe pain in his lower back as well as itching all over torso and back. Heat pads have been applied following therapy sessions for comfort.  He is completing sliding board transfers at [place]. He is inconsistent in his ability to complete sit>stand. He has been able to perform at min-mod A level from elevated surface, however, requires max +2 to stand from standard w/c height. He is also limited by decreased functional activity tolerance, requiring significantly increased time and rest breaks to complete all tasks as well as requiring increased assist throughout session and throughout day depending on fatigue and pain levels. UB bathing/dressing completed with set-up/supervision. LB bathing/dressing max-total A +2 from bed level or sit>stand at sink or in STEDY. [name] and daughter have been present and active throughout rehab admission, offering encouragement and seeing pt's CLOF.  Unsure of cancer prognosis at this time. Pt to meet with oncologist later today. Wil hopefully have better insight into pt's POC and thus therapy goals following this meeting. LTG may need to be modified depending on prognosis and goals of care. Pt remains motivated in therapy, however, is very frustrated by his sudden decline in function.   Patient continues to demonstrate the following deficits: muscle weakness, decreased cardiorespiratoy endurance and decreased sitting balance, decreased standing balance, decreased postural control, decreased balance strategies and  difficulty maintaining precautions and therefore will continue to benefit from skilled OT intervention to enhance overall performance with BADL and Reduce care partner burden.  Patient is currently not progressing towards OT goals.  Will await family meeting with oncologist and then will speak with pt and family regarding goals of therapy and will update LTG as appropriate  OT Short Term Goals Week 1:  OT Short Term Goal 1 (Week 1): Pt will complete transfer to toillet/BSC with mod A using LRAD OT Short Term Goal 1 - Progress (Week 1): Not met OT Short Term Goal 2 (Week 1): Pt will thread B LEs into pants with steadying assisting using AE PRN OT Short Term Goal 2 - Progress (Week 1): Met OT Short Term Goal 3 (Week 1): Pt will tolerate standing at sink to complete 1 grooming task in standing with steadying assist OT Short Term Goal 3 - Progress (Week 1): Not met Week 2:  OT Short Term Goal 1 (Week 2): Pt will consistently complete basic sliding board transfers with min A OT Short Term Goal 2 (Week 2): Pt will complete consecutive toilet transfers as  well as toileting task with +1 assist only in order to reduce caregiver burden OT Short Term Goal 3 (Week 2): Pt will stand to complete one grooming task in order to increase functional activity tolerance OT Short Term Goal 4 (Week 2): Pt will consistently complete LB dressing task with +1 assist without use of lift equipment in prep for d/c   Skilled Therapeutic Interventions/Progress Updates:     Arrived at 8:45 for scheduled session. Pt just receiving pain meds and requesting therapist to return once pain meds had chance to work. Therapist  returned at 10:06 and pt agreeable to cont with therapy as able. Reports pain 6/10 at rest in lower back, willing to cont as up to date on meds. Pain increasing with mobility, repositioned throughout session for comfort and increased time required for all transfers due to significant pain. Pt agreeable to attempt  shower this session. Used STEDY throughout session, requiring +2 total A to stand due to pain and "catch" in back. Transferred onto roll in shower chair. Pt with BM while on roll in shower chair.  He bathed with assist for LEs, completing UB bathing/dressing with supervision. Pt voiced feeling very relaxing in shower and helps manage itching. However, following ~15 minutes sitting on roll in shower chair, complaints of increased numbness in LEs and requesting to exit. +2 total A to stand from [facility] and return to bed. Hand off to PT with pt sitting EOB.    Therapy Documentation Precautions:  Precautions Precautions: Back Precaution Comments: re-educated on BLT, pt cont to be reminded during function Required Braces or Orthoses: Spinal Brace Spinal Brace: Thoracolumbosacral orthotic, Applied in sitting position Restrictions Weight Bearing Restrictions: No Pain: Pain Assessment Pain Scale: 0-10 Pain Score: 6  Pain Type: Surgical pain Pain Location: Back Pain Orientation: Mid;Lower Pain Descriptors / Indicators: Stabbing Pain Intervention(s): Shower, repositioned, RN aware   Therapy/Group: Individual Therapy  Brian [name] 07/08/2018, 6:50 AM

## 2018-07-08 NOTE — Progress Notes (Signed)
Physical Therapy Session Note  Patient Details  Name: Brian [name] MRN: [id number] Date of Birth: 01/10/1946  Today's Date: 07/08/2018 PT Individual Time: 1130-1200 PT Individual Time Calculation (min): 30 min   Short Term Goals:  Week 2:  PT Short Term Goal 1 (Week 2): supine> sit with min assist PT Short Term Goal 2 (Week 2): slide board transfer with mod assist consistently PT Short Term Goal 3 (Week 2): pt will be able to describe spinal precautions regarding functional movements  Skilled Therapeutic Interventions/Progress Updates:  Per Dr. Serita Grit note, Oncology visited pt this AM; awaiting note.  pt sitting up on shower chair, Amy OT having just finished assisting pt for shower.  Amy was donning TLSO.  Pt c/o of numbness bil buttocks and bil LEs.  PT donned TEDS.   +2 sit> stand in bariatric [place].   Once standing, pt able to remain standing x 30 seconds as this PT moved [place] toward bed.  Stand> sit from [place] with mod assist.  Pt rested in sitting a few minutes, then PT doffed TLSO.  Mod assist for bil LEs to lift into bed as pt moved sit> supine onto R side, exclaiming in pain as he did so.  Pain R PSIS area.  Pt with new pain patch on this area.  Pt rolled supine>< R/L several times for donning shorts over brief, and straightening bed pad.  Pt scooted up in bed +2 assist, using bed features, and LLE.  PT instructed wife in using small towel roll under R thigh to stabilize LE in neutral hip rotation, which relieved his pain somewhat.  PT left pt resting in bed with 1 pillow under head, pillows under bil calves, and HOB raised to safe position for pt to start eating lunch.  Wife and dtr present.       Therapy Documentation Precautions:  Precautions Precautions: Back Precaution Comments: re-educated on BLT, pt cont to be reminded during function Required Braces or Orthoses: Spinal Brace Spinal Brace: Thoracolumbosacral orthotic, Applied in sitting  position Restrictions Weight Bearing Restrictions: No   Vital Signs: 113/76 BP in supine, HR 96, O2 sats 96%  Pain: Pain Assessment Pain Scale: 0-10 Pain Score:5/10 Pain Type: Surgical pain Pain Location: Back Pain Orientation: Mid;Lower Premedicated, positioned in bed for comfort    Therapy/Group: Individual Therapy  [name] 07/08/2018, 12:19 PM

## 2018-07-08 NOTE — Progress Notes (Signed)
Physical Therapy Session Note  Patient Details  Name: Brian [name] MRN: [id number] Date of Birth: [birth year]  Today's Date: 07/08/2018 PT Individual Time: 1630-1640   10 min   Short Term Goals: Week 1:  PT Short Term Goal 1 (Week 1): pt will move supine> sit with supervision PT Short Term Goal 1 - Progress (Week 1): Not met PT Short Term Goal 2 (Week 1): pt will perform basic transfer with +1 consistently PT Short Term Goal 2 - Progress (Week 1): Progressing toward goal PT Short Term Goal 3 (Week 1): pt will propel w/c x 100' PT Short Term Goal 3 - Progress (Week 1): Met PT Short Term Goal 4 (Week 1): pt will perform gait with LRAD x 35' with min assist PT Short Term Goal 4 - Progress (Week 1): Met PT Short Term Goal 5 (Week 1): pt will demonstrate knowledge of back precautions when moving in/out of bed wiht 0-1 cue PT Short Term Goal 5 - Progress (Week 1): Not met Week 2:  PT Short Term Goal 1 (Week 2): supine> sit with min assist PT Short Term Goal 2 (Week 2): slide board transfer with mod assist consistently PT Short Term Goal 3 (Week 2): pt will be able to describe spinal precautions regarding functional movements  Skilled Therapeutic Interventions/Progress Updates:   Pt received supine in bed and reports that he has an xray in the near future and would like to remain in bed. PT attempted to have pt participate in supine therex, but pt declined due to fatigue and pain. PT instructed pt in bed mobility to reposition and decrease hip pain with max assist to perform lateral scoot x 2. Significant pain in R shoulder with attempt to perform scooting. Pt left in bed with family present and all needs met.          Therapy Documentation Precautions:  Precautions Precautions: Back Precaution Comments: re-educated on BLT, pt cont to be reminded during function Required Braces or Orthoses: Spinal Brace Spinal Brace: Thoracolumbosacral orthotic, Applied in sitting  position Restrictions Weight Bearing Restrictions: No General:   Vital Signs: Therapy Vitals Temp: (!) 97.5 F (36.4 C) Pulse Rate: (!) 102 Resp: 16 BP: 103/67 Patient Position (if appropriate): Lying Oxygen Therapy SpO2: (!) 88 % O2 Device: Room Air Pain:    9/10 low back and L hip . RN made aware.    Therapy/Group: Individual Therapy  [name] 07/08/2018, 4:38 PM

## 2018-07-08 NOTE — Progress Notes (Signed)
Occupational Therapy Session Note  Patient Details  Name: Brian [name] MRN: [id number] Date of Birth: 1945/12/05  Today's Date: 07/08/2018 OT Individual Time: 1505-1605 OT Individual Time Calculation (min): 60 min    Short Term Goals: Week 1:  OT Short Term Goal 1 (Week 1): Pt will complete transfer to toillet/BSC with mod A using LRAD OT Short Term Goal 1 - Progress (Week 1): Not met OT Short Term Goal 2 (Week 1): Pt will thread B LEs into pants with steadying assisting using AE PRN OT Short Term Goal 2 - Progress (Week 1): Met OT Short Term Goal 3 (Week 1): Pt will tolerate standing at sink to complete 1 grooming task in standing with steadying assist OT Short Term Goal 3 - Progress (Week 1): Not met  Skilled Therapeutic Interventions/Progress Updates:    1:1. Pt reporting 8/10 pain and RN received medication from RN. OT dons ace wraps. Instructed pt in log rolling with MOD A and A for trunk elevation to sit EOB. OTdons TLSO and abdominal binder. Pt completes slide board transfer with MAX A of 1 with VC for head hips relationship. Pt spends 5 min outside to get change of scenery and improve mood. Slide board back to bed as stated above. Exited session with pt seated bed bed, call light in reach and all needs met  Therapy Documentation Precautions:  Precautions Precautions: Back Precaution Comments: re-educated on BLT, pt cont to be reminded during function Required Braces or Orthoses: Spinal Brace Spinal Brace: Thoracolumbosacral orthotic, Applied in sitting position Restrictions Weight Bearing Restrictions: No General:   Vital Signs: Therapy Vitals Temp: (!) 97.5 F (36.4 C) Pulse Rate: (!) 102 Resp: 16 BP: 103/67 Patient Position (if appropriate): Lying Oxygen Therapy SpO2: (!) 88 % O2 Device: Room Air Pain:   ADL:   Vision   Perception    Praxis   Exercises:   Other Treatments:     Therapy/Group: Individual Therapy  Tonny Branch 07/08/2018, 4:19 PM

## 2018-07-08 NOTE — Progress Notes (Addendum)
[facility] PHYSICAL MEDICINE & REHABILITATION PROGRESS NOTE  Subjective/Complaints: Patient seen laying in bed this morning.  Daughter and wife at bedside.  Patient states he slept fairly overnight.  Daughter notes Benadryl helped.  Wife notes Heme/Onc stopped by this morning.  ROS: + Back pain, sleep disturbance.  Denies chest pain, shortness of breath, nausea, vomiting, diarrhea.  Objective: Vital Signs: Blood pressure 105/71, pulse (!) 109, temperature 98.8 F (37.1 C), temperature source Oral, resp. rate 16, height 5\' 10"  (1.778 m), weight 94.4 kg, SpO2 90 %. Dg Lumbar Spine Complete  Result Date: 07/07/2018 CLINICAL DATA:  Worsening low back pain. Recent decompressive lumbar laminectomy at L4-L5. EXAM: LUMBAR SPINE - COMPLETE 4+ VIEW COMPARISON:  CT lumbar spine dated June 23, 2018. FINDINGS: Five lumbar type vertebral bodies. Prior L3-L5 posterior fusion and L4 corpectomy. No evidence of hardware failure or loosening. No acute fracture or subluxation. Vertebral body heights are preserved. Alignment is normal. Mild disc height loss at L5-S1 is unchanged. Remaining intervertebral disc spaces are maintained. Interval placement of an IVC filter. Unchanged infrarenal abdominal aortic aneurysm. Unchanged cholelithiasis. IMPRESSION: 1.  No acute osseous abnormality. 2. Prior L3-L5 posterior fusion and L4 corpectomy without hardware complication. 3. Unchanged mild degenerative disc disease at L5-S1. Electronically Signed   By: Titus Dubin M.D.   On: 07/07/2018 10:35   Recent Labs    07/06/18 0527 07/08/18 0712  WBC 14.8* 12.6*  HGB 9.5* 9.5*  HCT 30.9* 30.4*  PLT 440* 451*   Recent Labs    07/06/18 0527 07/08/18 0712  NA 138 135  K 3.9 3.7  CL 108 103  CO2 24 24  GLUCOSE 109* 104*  BUN 10 9  CREATININE 0.71 0.63  CALCIUM 7.5* 7.5*    Physical Exam: BP 105/71 (BP Location: Left Arm)   Pulse (!) 109   Temp 98.8 F (37.1 C) (Oral)   Resp 16   Ht 5\' 10"  (1.778 m)   Wt  94.4 kg   SpO2 90%   BMI 29.86 kg/m  Constitutional: No distress . Vital signs reviewed. HENT: Normocephalic.  Atraumatic. Eyes: EOMI. No discharge. Cardiovascular: + Tachycardia.  Regular rhythm.  No JVD. Respiratory: CTA bilaterally.  Normal effort. GI: BS +. Non-distended. Musculoskeletal: Bilateral lower extremity edema, left greater than right, stable Neurological: He is alert and oriented  Dysphonia  Able to follow basic commands without difficulty.  Motor: Bilateral UE 5/5 proximal to distal, stable Right lower extremity: 4-/5 proximal distal, unchanged Left lower extremity: Hip flexion 3-/5, knee extension 4-/5, ankle dorsiflexion 4/5, unchanged Skin: Generalized rash Psychiatric: His behavior is normal.Judgmentand thought contentnormal. His affect is blunt.  Assessment/Plan: 1. Functional deficits secondary to lung cancer with mets to ribs, vertebrae with oncological fracture status post lumbar fusion which require 3+ hours per day of interdisciplinary therapy in a comprehensive inpatient rehab setting.  Physiatrist is providing close team supervision and 24 hour management of active medical problems listed below.  Physiatrist and rehab team continue to assess barriers to discharge/monitor patient progress toward functional and medical goals  Care Tool:  Bathing  Bathing activity did not occur: Safety/medical concerns(Unable to assess 2/2 drop in BP with upright positioning) Body parts bathed by patient: Right arm, Left arm, Chest, Face, Abdomen, Front perineal area, Buttocks, Right upper leg, Left upper leg   Body parts bathed by helper: Right lower leg, Left lower leg     Bathing assist Assist Level: Minimal Assistance - Patient > 75%     Upper  Body Dressing/Undressing Upper body dressing Upper body dressing/undressing activity did not occur (including orthotics): Safety/medical concerns(B/P issues ) What is the patient wearing?: Pull over shirt,  Orthosis Orthosis activity level: Performed by helper  Upper body assist Assist Level: Set up assist    Lower Body Dressing/Undressing Lower body dressing      What is the patient wearing?: Pants     Lower body assist Assist for lower body dressing: Minimal Assistance - Patient > 75%     Toileting Toileting Toileting Activity did not occur (Clothing management and hygiene only): Safety/medical concerns(Unable to assess 2/2 drop in BP with upright positioning)  Toileting assist Assist for toileting: Moderate Assistance - Patient 50 - 74%(While standing in STEDY)     Transfers Chair/bed transfer  Transfers assist     Chair/bed transfer assist level: Moderate Assistance - Patient 50 - 74%     Locomotion Ambulation   Ambulation assist   Ambulation activity did not occur: Safety/medical concerns  Assist level: Minimal Assistance - Patient > 75%(w/c follow for safety) Assistive device: Walker-rolling Max distance: 18   Walk 10 feet activity   Assist     Assist level: Minimal Assistance - Patient > 75% Assistive device: Walker-rolling   Walk 50 feet activity   Assist           Walk 150 feet activity   Assist           Walk 10 feet on uneven surface  activity   Assist           Wheelchair     Assist Will patient use wheelchair at discharge?: Yes Type of Wheelchair: Manual Wheelchair activity did not occur: Safety/medical concerns(hypotensive)  Wheelchair assist level: Supervision/Verbal cueing Max wheelchair distance: 75    Wheelchair 50 feet with 2 turns activity    Assist    Wheelchair 50 feet with 2 turns activity did not occur: Safety/medical concerns   Assist Level: Supervision/Verbal cueing   Wheelchair 150 feet activity     Assist Wheelchair 150 feet activity did not occur: Safety/medical concerns   Assist Level: Supervision/Verbal cueing      Medical Problem List and Plan: 1.Functional and mobility  deficitssecondary to metastatic lung cancer to left clavicle, ribs, L4 vertebral body with fracture. Pt is s/p L4 corpectomy and L3-5 fusion by Dr. Rolena Infante on 06/11/18  Continue CIR  Await Heme/Onc recs 2. DVT: dopplers done this evening and patient has DVT in left CFV             IVC filter placement on 1/8  Pt not a candidate for anticoagulation due to hematoma and bleeding riskat present although long term he is high VTE risk 3. Pain Management:Continue Fentanyl 12.5 mcg with norco/dilaudid prn. Unable to tolerate Oxycodone. Monitor patient's pain with increased activity.  Will consider increasing fentanyl tomorrow  Appreciate palliative care consult- notes reviewed- Robaxin frequency increased, Norco dose increased  Lidoderm patch added on 1/16 4. Mood:LCSW to follow for evaluation and support. 5. Neuropsych: This patientiscapable of making decisions on hisown behalf. 6. Skin/Wound Care:Monitor wound for healing. Has been refusing ensure--Added prostat to help promote wound healing. 7. Fluids/Electrolytes/Nutrition:Monitor I/O.  8.Blood pressure; Intermittent elevation noted   Orthostatic vital signs ordered on 1/10, remains pending on 1/16  Abdominal binder as needed  Monitor with increased mobility 9. ABLA: Continue to monitor with serial checks.   Hemoglobin 9.5 on 1/16 10. Hypokalemia:   Potassium 3.7 on 1/16   Supplement increased on 1/13  Magnesium 1.6 on 1/14   Supplement initiated on 1/14 11. Rash: Continue Clobetasol bidas well as hypoallergenic sheets. 12. Neuropathic pain: Improving on gabapentin 300 mg tid 13. Constipation:Managed with miralax prn--patient wants to manage his bowel program. 14.  Hypoalbuminemia  Supplement initiated on 1/8 15.  Sinus tachycardia  Metoprolol 12.5 twice daily started on 1/13, changed to Coreg on 1/15  Remains labile on 1/16 16.  Hyponatremia  Sodium 135 on 1/16  Continue to monitor 17.  Leukocytosis  WBCs 12.6 on  1/16  UA with rare bacteria, urine culture pending   Chest x-ray reviewed with patient, unremarkable for acute intracranial process  Afebrile  Continue to monitor 18. Peripheral edema  Lasix 20 daily started on 1/15  19.  Sleep disturbance  Trazodone DC'd, Benadryl ordered for pruritus and sleep 20.  Pruritus.  Likely secondary to pain medications  Benadryl as needed  LOS: 9 days A FACE TO FACE EVALUATION WAS PERFORMED  Brian [name] 07/08/2018, 8:52 AM

## 2018-07-08 NOTE — Progress Notes (Signed)
    Subjective:     Patient reports pain as 5 on 0-10 scale.  Reports increased leg pain - primarily left gluteal region.  No significant radicular complaints reports incisional back pain   Positive void Positive bowel movement Positive flatus Negative chest pain or shortness of breath  Objective: Vital signs in last 24 hours: Temp:  [97.5 F (36.4 C)-102.3 F (39.1 C)] 97.5 F (36.4 C) (01/16 1358) Pulse Rate:  [102-118] 102 (01/16 1358) Resp:  [16] 16 (01/16 1358) BP: (103-115)/(67-71) 103/67 (01/16 1358) SpO2:  [88 %-90 %] 88 % (01/16 1358) Weight:  [94.4 kg] 94.4 kg (01/16 0547)  Intake/Output from previous day: 01/15 0701 - 01/16 0700 In: 900 [P.O.:900] Out: 625 [Urine:625]  Labs: Recent Labs    07/08/18 0712 07/08/18 1459  WBC 12.6* 13.3*  RBC 3.28* 3.20*  HCT 30.4* 29.4*  PLT 451* 411*   Recent Labs    07/06/18 0527 07/08/18 0712  NA 138 135  K 3.9 3.7  CL 108 103  CO2 24 24  BUN 10 9  CREATININE 0.71 0.63  GLUCOSE 109* 104*  CALCIUM 7.5* 7.5*   No results for input(s): LABPT, INR in the last 72 hours.  Physical Exam: ABD soft Intact pulses distally Dorsiflexion/Plantar flexion intact Incision: moderate drainage Compartment soft Body mass index is 29.86 kg/m.   Assessment/Plan: Patient stable  xrays n/a  Continue mobilization with physical therapy Continue care  Patient noted to have drainage from midline posterior lumbar incision (most recent surgery).   Also fever to 102 and mildly elevated WBC. Recommend wound care nurse eval in the AM Keflex 500mg  1 poQDID Plan on return to OR Friday or Saturday for wash out and either wound vac or closure. Discussed with family - in agreement with the surgical plan Recommend he no longer use heating pad as this can adversely effect wound healing. Melina Schools, MD [facility] [phone]

## 2018-07-08 NOTE — Plan of Care (Addendum)
  Problem: SCI BOWEL ELIMINATION Goal: RH STG MANAGE BOWEL WITH ASSISTANCE Description STG Manage Bowel with Mod.Assistance.  Outcome: Not Progressing; laxative given LBM 1/13; due to pain med   Problem: SCI BOWEL ELIMINATION Goal: RH STG MANAGE BOWEL W/EQUIPMENT W/ASSISTANCE Description STG Manage Bowel With Equipment With mod. Assistance  Outcome: Not Progressing   Problem: RH SKIN INTEGRITY Goal: RH STG SKIN FREE OF INFECTION/BREAKDOWN Description With mod. Assist.  Outcome: Not Progressing; dehisced wound -back; PA aware; ABT started   Problem: RH SKIN INTEGRITY Goal: RH STG ABLE TO PERFORM INCISION/WOUND CARE W/ASSISTANCE Description STG Able To Perform Incision/Wound Care With Mod.Assistance.  Outcome: Not Progressing

## 2018-07-09 ENCOUNTER — Inpatient Hospital Stay: Admit: 2018-07-09 | Payer: Medicare Other | Admitting: Orthopedic Surgery

## 2018-07-09 ENCOUNTER — Inpatient Hospital Stay (HOSPITAL_COMMUNITY): Payer: Medicare Other | Admitting: Certified Registered"

## 2018-07-09 ENCOUNTER — Encounter (HOSPITAL_COMMUNITY)
Admission: RE | Disposition: A | Discharge status: Hospice | Payer: Self-pay | Source: Intra-hospital | Attending: Physical Medicine & Rehabilitation

## 2018-07-09 ENCOUNTER — Inpatient Hospital Stay (HOSPITAL_COMMUNITY): Payer: Medicare Other

## 2018-07-09 ENCOUNTER — Inpatient Hospital Stay (HOSPITAL_COMMUNITY): Payer: Medicare Other | Admitting: Physical Therapy

## 2018-07-09 ENCOUNTER — Encounter (HOSPITAL_COMMUNITY): Payer: Self-pay | Admitting: *Deleted

## 2018-07-09 ENCOUNTER — Inpatient Hospital Stay (HOSPITAL_COMMUNITY): Payer: Medicare Other | Admitting: Occupational Therapy

## 2018-07-09 DIAGNOSIS — M25551 Pain in right hip: Secondary | ICD-10-CM

## 2018-07-09 HISTORY — PX: LUMBAR WOUND DEBRIDEMENT: SHX1988

## 2018-07-09 LAB — CBC WITH DIFFERENTIAL/PLATELET
Abs Immature Granulocytes: 0.28 10*3/uL — ABNORMAL HIGH (ref 0.00–0.07)
BASOS PCT: 1 %
Basophils Absolute: 0.1 10*3/uL (ref 0.0–0.1)
Eosinophils Absolute: 1.2 10*3/uL — ABNORMAL HIGH (ref 0.0–0.5)
Eosinophils Relative: 9 %
HCT: 28.2 % — ABNORMAL LOW (ref 39.0–52.0)
Hemoglobin: 8.8 g/dL — ABNORMAL LOW (ref 13.0–17.0)
Immature Granulocytes: 2 %
Lymphocytes Relative: 21 %
Lymphs Abs: 2.6 10*3/uL (ref 0.7–4.0)
MCH: 28.8 pg (ref 26.0–34.0)
MCHC: 31.2 g/dL (ref 30.0–36.0)
MCV: 92.2 fL (ref 80.0–100.0)
Monocytes Absolute: 1 10*3/uL (ref 0.1–1.0)
Monocytes Relative: 8 %
NEUTROS PCT: 59 %
Neutro Abs: 7.4 10*3/uL (ref 1.7–7.7)
Platelets: 449 10*3/uL — ABNORMAL HIGH (ref 150–400)
RBC: 3.06 MIL/uL — ABNORMAL LOW (ref 4.22–5.81)
RDW: 16 % — ABNORMAL HIGH (ref 11.5–15.5)
WBC: 12.5 10*3/uL — ABNORMAL HIGH (ref 4.0–10.5)
nRBC: 0 % (ref 0.0–0.2)

## 2018-07-09 LAB — FERRITIN: FERRITIN: 1397 ng/mL — AB (ref 24–336)

## 2018-07-09 LAB — COMPREHENSIVE METABOLIC PANEL
ALT: 25 U/L (ref 0–44)
AST: 27 U/L (ref 15–41)
Albumin: 1.6 g/dL — ABNORMAL LOW (ref 3.5–5.0)
Alkaline Phosphatase: 115 U/L (ref 38–126)
Anion gap: 8 (ref 5–15)
BUN: 13 mg/dL (ref 8–23)
CHLORIDE: 104 mmol/L (ref 98–111)
CO2: 23 mmol/L (ref 22–32)
CREATININE: 0.7 mg/dL (ref 0.61–1.24)
Calcium: 7.4 mg/dL — ABNORMAL LOW (ref 8.9–10.3)
GFR calc Af Amer: >60 mL/min (ref >60)
Glucose, Bld: 111 mg/dL — ABNORMAL HIGH (ref 70–99)
Potassium: 4.3 mmol/L (ref 3.5–5.1)
Sodium: 135 mmol/L (ref 135–145)
Total Bilirubin: 0.5 mg/dL (ref 0.3–1.2)
Total Protein: 4.8 g/dL — ABNORMAL LOW (ref 6.5–8.1)

## 2018-07-09 LAB — IRON AND TIBC
Iron: 18 ug/dL — ABNORMAL LOW (ref 45–182)
Saturation Ratios: 12 % — ABNORMAL LOW (ref 17.9–39.5)
TIBC: 147 ug/dL — ABNORMAL LOW (ref 250–450)
UIBC: 129 ug/dL

## 2018-07-09 LAB — TYPE AND SCREEN
ABO/RH(D): O NEG
Antibody Screen: NEGATIVE

## 2018-07-09 SURGERY — LUMBAR WOUND DEBRIDEMENT
Anesthesia: General

## 2018-07-09 MED ORDER — HYDROMORPHONE HCL 1 MG/ML IJ SOLN
0.2500 mg | INTRAMUSCULAR | Status: DC | PRN
  Administered 2018-07-09 (×2): 0.25 mg via INTRAVENOUS

## 2018-07-09 MED ORDER — LACTATED RINGERS IV SOLN
INTRAVENOUS | Status: DC
  Administered 2018-07-09 (×2): via INTRAVENOUS

## 2018-07-09 MED ORDER — HYDROMORPHONE HCL 1 MG/ML IJ SOLN
INTRAMUSCULAR | Status: AC
  Filled 2018-07-09: qty 1

## 2018-07-09 MED ORDER — DIPHENHYDRAMINE HCL 25 MG PO CAPS
25.0000 mg | ORAL_CAPSULE | Freq: Every evening | ORAL | Status: DC | PRN
  Administered 2018-07-10 – 2018-07-13 (×4): 25 mg via ORAL
  Filled 2018-07-09 (×4): qty 1

## 2018-07-09 MED ORDER — VANCOMYCIN HCL 500 MG IV SOLR
INTRAVENOUS | Status: AC
  Filled 2018-07-09: qty 500

## 2018-07-09 MED ORDER — PHENOL 1.4 % MT LIQD: 1.0000 | OROMUCOSAL | Status: DC | PRN

## 2018-07-09 MED ORDER — FENTANYL CITRATE (PF) 250 MCG/5ML IJ SOLN
INTRAMUSCULAR | Status: AC
  Filled 2018-07-09: qty 5

## 2018-07-09 MED ORDER — DEXAMETHASONE SODIUM PHOSPHATE 4 MG/ML IJ SOLN
INTRAMUSCULAR | Status: DC | PRN
  Administered 2018-07-09: 5 mg via INTRAVENOUS

## 2018-07-09 MED ORDER — SUGAMMADEX SODIUM 200 MG/2ML IV SOLN
INTRAVENOUS | Status: DC | PRN
  Administered 2018-07-09: 200 mg via INTRAVENOUS

## 2018-07-09 MED ORDER — POLYETHYLENE GLYCOL 3350 17 G PO PACK: 17.0000 g | PACK | Freq: Once | ORAL | Status: DC

## 2018-07-09 MED ORDER — LACTATED RINGERS IV SOLN: INTRAVENOUS | Status: DC

## 2018-07-09 MED ORDER — DIPHENHYDRAMINE HCL 12.5 MG/5ML PO ELIX
6.2500 mg | ORAL_SOLUTION | Freq: Four times a day (QID) | ORAL | Status: DC | PRN
  Administered 2018-07-10: 6.25 mg via ORAL
  Filled 2018-07-09: qty 10

## 2018-07-09 MED ORDER — PROPOFOL 10 MG/ML IV BOLUS
INTRAVENOUS | Status: DC | PRN
  Administered 2018-07-09: 140 mg via INTRAVENOUS

## 2018-07-09 MED ORDER — MEPERIDINE HCL 50 MG/ML IJ SOLN: 6.2500 mg | INTRAMUSCULAR | Status: DC | PRN

## 2018-07-09 MED ORDER — SODIUM CHLORIDE 0.9 % IV SOLN
INTRAVENOUS | Status: DC | PRN
  Administered 2018-07-09: 25 ug/min via INTRAVENOUS

## 2018-07-09 MED ORDER — FENTANYL 25 MCG/HR TD PT72
1.0000 | MEDICATED_PATCH | TRANSDERMAL | Status: DC
  Administered 2018-07-09 – 2018-07-12 (×2): 1 via TRANSDERMAL
  Filled 2018-07-09 (×2): qty 1

## 2018-07-09 MED ORDER — 0.9 % SODIUM CHLORIDE (POUR BTL) OPTIME
TOPICAL | Status: DC | PRN
  Administered 2018-07-09: 3000 mL

## 2018-07-09 MED ORDER — DULOXETINE HCL 30 MG PO CPEP
30.0000 mg | ORAL_CAPSULE | Freq: Every day | ORAL | Status: DC
  Administered 2018-07-09 – 2018-07-13 (×5): 30 mg via ORAL
  Filled 2018-07-09 (×5): qty 1

## 2018-07-09 MED ORDER — CEFAZOLIN SODIUM-DEXTROSE 2-4 GM/100ML-% IV SOLN
INTRAVENOUS | Status: AC
  Filled 2018-07-09: qty 100

## 2018-07-09 MED ORDER — ONDANSETRON HCL 4 MG/2ML IJ SOLN
INTRAMUSCULAR | Status: DC | PRN
  Administered 2018-07-09: 4 mg via INTRAVENOUS

## 2018-07-09 MED ORDER — SODIUM CHLORIDE 0.9 % IR SOLN
Status: DC | PRN
  Administered 2018-07-09: 3000 mL

## 2018-07-09 MED ORDER — ROCURONIUM BROMIDE 100 MG/10ML IV SOLN
INTRAVENOUS | Status: DC | PRN
  Administered 2018-07-09: 50 mg via INTRAVENOUS

## 2018-07-09 MED ORDER — CEFAZOLIN SODIUM-DEXTROSE 2-4 GM/100ML-% IV SOLN
2.0000 g | Freq: Once | INTRAVENOUS | Status: AC
  Administered 2018-07-09: 2 g via INTRAVENOUS

## 2018-07-09 MED ORDER — CEFAZOLIN SODIUM-DEXTROSE 1-4 GM/50ML-% IV SOLN
1.0000 g | Freq: Three times a day (TID) | INTRAVENOUS | Status: DC
  Administered 2018-07-10 – 2018-07-12 (×8): 1 g via INTRAVENOUS
  Filled 2018-07-09 (×9): qty 50

## 2018-07-09 MED ORDER — LACTATED RINGERS IV SOLN
INTRAVENOUS | Status: DC
  Administered 2018-07-09: 19:00:00 via INTRAVENOUS

## 2018-07-09 MED ORDER — FENTANYL CITRATE (PF) 100 MCG/2ML IJ SOLN
INTRAMUSCULAR | Status: DC | PRN
  Administered 2018-07-09: 150 ug via INTRAVENOUS
  Administered 2018-07-09: 50 ug via INTRAVENOUS

## 2018-07-09 MED ORDER — MENTHOL 3 MG MT LOZG: 1.0000 | LOZENGE | OROMUCOSAL | Status: DC | PRN

## 2018-07-09 SURGICAL SUPPLY — 67 items
BUR EGG ELITE 4.0 (BURR) IMPLANT
BUR EGG ELITE 4.0MM (BURR)
CANISTER SUCT 3000ML PPV (MISCELLANEOUS) ×3 IMPLANT
CLOSURE WOUND 1/2 X4 (GAUZE/BANDAGES/DRESSINGS) ×1
CORDS BIPOLAR (ELECTRODE) ×3 IMPLANT
COVER SURGICAL LIGHT HANDLE (MISCELLANEOUS) ×3 IMPLANT
COVER WAND RF STERILE (DRAPES) ×3 IMPLANT
DRAPE HALF SHEET 40X57 (DRAPES) ×3 IMPLANT
DRAPE POUCH INSTRU U-SHP 10X18 (DRAPES) ×3 IMPLANT
DRAPE SURG 17X23 STRL (DRAPES) ×3 IMPLANT
DRAPE U-SHAPE 47X51 STRL (DRAPES) ×3 IMPLANT
DRSG AQUACEL AG ADV 3.5X10 (GAUZE/BANDAGES/DRESSINGS) ×3 IMPLANT
DRSG VAC ATS SM SENSATRAC (GAUZE/BANDAGES/DRESSINGS) ×2 IMPLANT
DURAPREP 26ML APPLICATOR (WOUND CARE) ×3 IMPLANT
ELECT BLADE 4.0 EZ CLEAN MEGAD (MISCELLANEOUS)
ELECT CAUTERY BLADE 6.4 (BLADE) ×3 IMPLANT
ELECT PENCIL ROCKER SW 15FT (MISCELLANEOUS) ×3 IMPLANT
ELECT REM PT RETURN 9FT ADLT (ELECTROSURGICAL) ×3
ELECTRODE BLDE 4.0 EZ CLN MEGD (MISCELLANEOUS) IMPLANT
ELECTRODE REM PT RTRN 9FT ADLT (ELECTROSURGICAL) ×1 IMPLANT
EVACUATOR 1/8 PVC DRAIN (DRAIN) IMPLANT
GLOVE BIO SURGEON STRL SZ 6.5 (GLOVE) ×2 IMPLANT
GLOVE BIO SURGEONS STRL SZ 6.5 (GLOVE) ×1
GLOVE BIOGEL PI IND STRL 6.5 (GLOVE) ×1 IMPLANT
GLOVE BIOGEL PI IND STRL 8.5 (GLOVE) ×1 IMPLANT
GLOVE BIOGEL PI INDICATOR 6.5 (GLOVE) ×2
GLOVE BIOGEL PI INDICATOR 8.5 (GLOVE) ×2
GLOVE SS BIOGEL STRL SZ 8.5 (GLOVE) ×1 IMPLANT
GLOVE SUPERSENSE BIOGEL SZ 8.5 (GLOVE) ×2
GOWN STRL REUS W/ TWL LRG LVL3 (GOWN DISPOSABLE) ×1 IMPLANT
GOWN STRL REUS W/TWL 2XL LVL3 (GOWN DISPOSABLE) ×6 IMPLANT
GOWN STRL REUS W/TWL LRG LVL3 (GOWN DISPOSABLE) ×2
KIT BASIN OR (CUSTOM PROCEDURE TRAY) ×3 IMPLANT
KIT PREVENA INCISION MGT20CM45 (CANNISTER) ×2 IMPLANT
KIT STIMULAN RAPID CURE 5CC (Orthopedic Implant) ×2 IMPLANT
KIT TURNOVER KIT B (KITS) ×3 IMPLANT
NDL SPNL 18GX3.5 QUINCKE PK (NEEDLE) ×2 IMPLANT
NEEDLE 22X1 1/2 (OR ONLY) (NEEDLE) ×3 IMPLANT
NEEDLE SPNL 18GX3.5 QUINCKE PK (NEEDLE) ×6 IMPLANT
NS IRRIG 1000ML POUR BTL (IV SOLUTION) ×3 IMPLANT
PACK LAMINECTOMY ORTHO (CUSTOM PROCEDURE TRAY) ×3 IMPLANT
PACK UNIVERSAL I (CUSTOM PROCEDURE TRAY) ×3 IMPLANT
PAD ARMBOARD 7.5X6 YLW CONV (MISCELLANEOUS) ×6 IMPLANT
PATTIES SURGICAL .5 X.5 (GAUZE/BANDAGES/DRESSINGS) IMPLANT
PATTIES SURGICAL .5 X1 (DISPOSABLE) IMPLANT
SPONGE SURGIFOAM ABS GEL 100 (HEMOSTASIS) IMPLANT
STRIP CLOSURE SKIN 1/2X4 (GAUZE/BANDAGES/DRESSINGS) ×2 IMPLANT
SURGIFLO W/THROMBIN 8M KIT (HEMOSTASIS) IMPLANT
SUT BONE WAX W31G (SUTURE) ×3 IMPLANT
SUT MON AB 3-0 SH 27 (SUTURE) ×2
SUT MON AB 3-0 SH27 (SUTURE) ×1 IMPLANT
SUT PDS AB 0 CT 36 (SUTURE) ×2 IMPLANT
SUT PDS AB 1 CT  36 (SUTURE) ×2
SUT PDS AB 1 CT 36 (SUTURE) IMPLANT
SUT VIC AB 0 CT1 27 (SUTURE) ×2
SUT VIC AB 0 CT1 27XBRD ANBCTR (SUTURE) ×1 IMPLANT
SUT VIC AB 1 CTX 36 (SUTURE) ×4
SUT VIC AB 1 CTX36XBRD ANBCTR (SUTURE) ×2 IMPLANT
SUT VIC AB 2-0 CT1 18 (SUTURE) ×3 IMPLANT
SYR BULB IRRIGATION 50ML (SYRINGE) ×3 IMPLANT
SYR CONTROL 10ML LL (SYRINGE) ×3 IMPLANT
TOWEL OR 17X24 6PK STRL BLUE (TOWEL DISPOSABLE) ×3 IMPLANT
TOWEL OR 17X26 10 PK STRL BLUE (TOWEL DISPOSABLE) ×3 IMPLANT
WATER STERILE IRR 1000ML POUR (IV SOLUTION) ×3 IMPLANT
WND VAC CANISTER 500ML (MISCELLANEOUS) ×2 IMPLANT
YANKAUER SUCT BULB TIP NO VENT (SUCTIONS) ×3 IMPLANT
stimulin ×2 IMPLANT

## 2018-07-09 NOTE — Progress Notes (Addendum)
[facility] PHYSICAL MEDICINE & REHABILITATION PROGRESS NOTE  Subjective/Complaints: Patient seen laying in bed this morning.  Cousin at bedside.  He states he slept fairly overnight.  He is frustrated with the way things are going.  Within the last 24 hours patient was seen by hematology/oncology with discussion regarding prognosis.  He was seen by orthopedics, with plan for I&D this evening.  He was also seen by palliative care.  X-rays of chest and hip were ordered.  Discussed with PA.  ROS: + Back pain, joint pain, sleep disturbance, pruritus.  Denies chest pain, shortness of breath, nausea, vomiting, diarrhea.  Objective: Vital Signs: Blood pressure 95/67, pulse (!) 105, temperature 98.1 F (36.7 C), resp. rate 16, height 5\' 10"  (1.778 m), weight 94.4 kg, SpO2 90 %. Dg Chest 1 View  Result Date: 07/08/2018 CLINICAL DATA:  Fever, posterior RIGHT hip pain, BILATERAL shoulder pain and shortness of breath for 16 weeks EXAM: CHEST  1 VIEW COMPARISON:  07/05/2018 FINDINGS: Upper normal heart size. Normal mediastinal contours and pulmonary vascularity. Interstitial infiltrates in the mid to lower lungs new since previous study. Prominent hila. RIGHT infrahilar density corresponding to mass on previous exam. Upper lungs clear. Bones demineralized. IMPRESSION: RIGHT lower lobe mass as seen previously. Interstitial infiltrates in the mid to lower lungs which could represent infection or edema. Electronically Signed   By: Lavonia Dana M.D.   On: 07/08/2018 20:06   Dg Lumbar Spine Complete  Result Date: 07/07/2018 CLINICAL DATA:  Worsening low back pain. Recent decompressive lumbar laminectomy at L4-L5. EXAM: LUMBAR SPINE - COMPLETE 4+ VIEW COMPARISON:  CT lumbar spine dated June 23, 2018. FINDINGS: Five lumbar type vertebral bodies. Prior L3-L5 posterior fusion and L4 corpectomy. No evidence of hardware failure or loosening. No acute fracture or subluxation. Vertebral body heights are preserved.  Alignment is normal. Mild disc height loss at L5-S1 is unchanged. Remaining intervertebral disc spaces are maintained. Interval placement of an IVC filter. Unchanged infrarenal abdominal aortic aneurysm. Unchanged cholelithiasis. IMPRESSION: 1.  No acute osseous abnormality. 2. Prior L3-L5 posterior fusion and L4 corpectomy without hardware complication. 3. Unchanged mild degenerative disc disease at L5-S1. Electronically Signed   By: Titus Dubin M.D.   On: 07/07/2018 10:35   Dg Hip Unilat With Pelvis 2-3 Views Right  Result Date: 07/08/2018 CLINICAL DATA:  Right posterior hip pain. No known injury. History of metastatic lung cancer. EXAM: DG HIP (WITH OR WITHOUT PELVIS) 2-3V RIGHT COMPARISON:  04/13/2018. FINDINGS: Minimal right femoral head and neck junction spur formation. Mild joint space narrowing. No fracture or dislocation. No visible bone metastases. Lower lumbar spine fixation hardware. Atheromatous arterial calcifications. IMPRESSION: 1. Minimal right hip degenerative changes. 2. Lower lumbar spine fixation hardware. Electronically Signed   By: Claudie Revering M.D.   On: 07/08/2018 20:03   Recent Labs    07/08/18 1459 07/09/18 0601  WBC 13.3* 12.5*  HGB 8.9* 8.8*  HCT 29.4* 28.2*  PLT 411* 449*   Recent Labs    07/08/18 0712 07/09/18 0601  NA 135 135  K 3.7 4.3  CL 103 104  CO2 24 23  GLUCOSE 104* 111*  BUN 9 13  CREATININE 0.63 0.70  CALCIUM 7.5* 7.4*    Physical Exam: BP 95/67   Pulse (!) 105   Temp 98.1 F (36.7 C)   Resp 16   Ht 5\' 10"  (1.778 m)   Wt 94.4 kg   SpO2 90%   BMI 29.86 kg/m  Constitutional: No distress .  Vital signs reviewed. HENT: Normocephalic.  Atraumatic. Eyes: EOMI. No discharge. Cardiovascular: Regular rate and rhythm.  No JVD. Respiratory: CTA bilaterally.  Normal effort. GI: BS +. Non-distended. Musculoskeletal: Bilateral lower extremity edema, left greater than right, improving Neurological: He is alert and oriented   Dysphonia Able to follow basic commands without difficulty.  Motor: Bilateral UE 5/5 proximal to distal, stable Right lower extremity: 4-/5 proximal distal, unchanged Left lower extremity: Hip flexion 3-/5, knee extension 4-/5, ankle dorsiflexion 4/5, unchanged Skin: Generalized rash, stable Back incision not examined today. Psychiatric: His behavior is normal.Judgmentand thought contentnormal. His affect is frustrated.  Assessment/Plan: 1. Functional deficits secondary to lung cancer with mets to ribs, vertebrae with oncological fracture status post lumbar fusion which require 3+ hours per day of interdisciplinary therapy in a comprehensive inpatient rehab setting.  Physiatrist is providing close team supervision and 24 hour management of active medical problems listed below.  Physiatrist and rehab team continue to assess barriers to discharge/monitor patient progress toward functional and medical goals  Care Tool:  Bathing  Bathing activity did not occur: Safety/medical concerns(Unable to assess 2/2 drop in BP with upright positioning) Body parts bathed by patient: Right arm, Left arm, Chest, Abdomen, Front perineal area, Right upper leg, Left upper leg, Face   Body parts bathed by helper: Buttocks, Right lower leg, Left lower leg     Bathing assist Assist Level: Maximal Assistance - Patient 24 - 49%     Upper Body Dressing/Undressing Upper body dressing Upper body dressing/undressing activity did not occur (including orthotics): Safety/medical concerns(B/P issues ) What is the patient wearing?: Pull over shirt, Orthosis Orthosis activity level: Performed by helper  Upper body assist Assist Level: Minimal Assistance - Patient > 75%    Lower Body Dressing/Undressing Lower body dressing      What is the patient wearing?: Pants     Lower body assist Assist for lower body dressing: Minimal Assistance - Patient > 75%     Toileting Toileting Toileting Activity did not  occur (Clothing management and hygiene only): Safety/medical concerns(Unable to assess 2/2 drop in BP with upright positioning)  Toileting assist Assist for toileting: 2 Helpers     Transfers Chair/bed transfer  Transfers assist     Chair/bed transfer assist level: 2 Helpers(bariatric Stedy from shower chair)     Locomotion Ambulation   Ambulation assist   Ambulation activity did not occur: Safety/medical concerns  Assist level: Minimal Assistance - Patient > 75%(w/c follow for safety) Assistive device: Walker-rolling Max distance: 18   Walk 10 feet activity   Assist     Assist level: Minimal Assistance - Patient > 75% Assistive device: Walker-rolling   Walk 50 feet activity   Assist           Walk 150 feet activity   Assist           Walk 10 feet on uneven surface  activity   Assist           Wheelchair     Assist Will patient use wheelchair at discharge?: Yes Type of Wheelchair: Manual Wheelchair activity did not occur: Safety/medical concerns(hypotensive)  Wheelchair assist level: Supervision/Verbal cueing Max wheelchair distance: 75    Wheelchair 50 feet with 2 turns activity    Assist    Wheelchair 50 feet with 2 turns activity did not occur: Safety/medical concerns   Assist Level: Supervision/Verbal cueing   Wheelchair 150 feet activity     Assist Wheelchair 150 feet activity did not occur:  Safety/medical concerns   Assist Level: Supervision/Verbal cueing      Medical Problem List and Plan: 1.Functional and mobility deficitssecondary to metastatic lung cancer to left clavicle, ribs, L4 vertebral body with fracture. Pt is s/p L4 corpectomy and L3-5 fusion by Dr. Rolena Infante on 06/11/18  Continue CIR  Appreciate heme/onc recs, notes reviewed- attempting to await improvement in function and strength prior to chemotherapy.  Iron studies ordered.  Plan to discuss with radiology/oncology.  Poor prognosis.  Appreciate  Ortho recs, plan for I&D of wound today. 2. DVT: dopplers done this evening and patient has DVT in left CFV             IVC filter placement on 1/8  Pt not a candidate for anticoagulation due to hematoma and bleeding riskat present although long term he is high VTE risk 3. Pain Management:Continue Fentanyl 12.5 mcg with norco/dilaudid prn. Unable to tolerate Oxycodone. Monitor patient's pain with increased activity.  Will increase fentanyl patch today  Appreciate palliative care consult- notes reviewed- Robaxin frequency increased, Norco dose increased  Lidoderm patch DC'd by Ortho  Cymbalta added on 1/17 4. Mood:LCSW to follow for evaluation and support.  Cymbalta added 5. Neuropsych: This patientiscapable of making decisions on hisown behalf. 6. Skin/Wound Care:Monitor wound for healing. Has been refusing ensure--Added prostat to help promote wound healing. 7. Fluids/Electrolytes/Nutrition:Monitor I/O.  8.Blood pressure; Intermittent elevation noted   Orthostatic vital signs ordered on 1/10, remains pending on 1/17  Abdominal binder as needed  Monitor with increased mobility 9. ABLA: Continue to monitor with serial checks.   Hemoglobin 9.5 on 1/16  Iron studies pending 10. Hypokalemia:   Potassium 4.3 on 1/17   Supplement increased on 1/13  Magnesium 1.6 on 1/14   Supplement initiated on 1/14 11. Rash: Continue Clobetasol bidas well as hypoallergenic sheets. 12. Neuropathic pain: Improving on gabapentin 300 mg tid 13. Constipation:Managed with miralax prn--patient wants to manage his bowel program. 14.  Hypoalbuminemia  Supplement initiated on 1/8 15.  Sinus tachycardia  Metoprolol 12.5 twice daily started on 1/13, changed to Coreg on 1/15  Improving on 1/17 16.  Hyponatremia  Sodium 135 on 1/17  Continue to monitor 17.  Leukocytosis  WBCs 12.5 on 1/17  UA with rare bacteria, urine culture pending   Chest x-ray reviewed with patient, unremarkable for acute  infectious process, repeat chest x-ray on 1/16 reviewed showing some lower abnormality-continue to monitor  Afebrile  Continue to monitor 18. Peripheral edema  Lasix 20 daily started on 1/15  19.  Sleep disturbance  Trazodone DC'd, Benadryl ordered for pruritus and sleep on 1/17 20.  Pruritus.  Secondary to pain medications  Benadryl as needed, dose increased on 1/17  Will consider steroids if necessary 21.  Multiple joint pain  Multifactorial  Hip x-ray reviewed, some degenerative changes  LOS: 10 days A FACE TO FACE EVALUATION WAS PERFORMED   [name] 07/09/2018, 9:08 AM

## 2018-07-09 NOTE — Progress Notes (Signed)
Occupational Therapy Session Note  Patient Details  Name: Brian [name] MRN: [id number] Date of Birth: 1945-06-29  Today's Date: 07/09/2018 OT Individual Time: 6759-1638 OT Individual Time Calculation (min): 60 min    Short Term Goals: Week 2:  OT Short Term Goal 1 (Week 2): Pt will consistently complete basic sliding board transfers with min A OT Short Term Goal 2 (Week 2): Pt will complete consecutive toilet transfers as  well as toileting task with +1 assist only in order to reduce caregiver burden OT Short Term Goal 3 (Week 2): Pt will stand to complete one grooming task in order to increase functional activity tolerance OT Short Term Goal 4 (Week 2): Pt will consistently complete LB dressing task with +1 assist without use of lift equipment in prep for d/c   Skilled Therapeutic Interventions/Progress Updates:   Pain much worse and inhibiting this session compared to previous sessions, RN and PA aware.     Pt seen for OT session focusing on positioning for comfort, bed mobilitym and education. PT in supine upon arrival with wife and cousin present. Pt voicing pain 6/10 in back at rest, becoming excruciating with mobility to the point pt unable to tolerate. He had increase in pain when donning B TED hose when pressure applied when pulling them up. Increase in B LE edema today, RN aware and assessed. Attempted to get pt sitting EOB, requiring signicantly incresaed time to get into side-lying secondary to pain, VCs for deep breathing techniques and attempting to position with pillows for comfort.  Attempted several times without success to sit pt upright as he was unable to tolerate LEs being removed from EOB.  Re-assessed with plan to use Maximove to transfer pt to recliner or w/c in order to allow for upright positioning. Pt unable to tolerate therapists attempt to ton TLSO in sidelying and placing of maximove sling. At this point, pt just requesting to rest in supine as unable to  tolerate positioning requiring to get OOB.  Pt left resting in supine at end of session, ice pack applied to lower back and all needs in reach. Family present. Throughout session, extensive education provided to pt and pt's wife regarding POC, OT/PT goals, DME, activity progression, pain management techniques, and d/c planning. Both pt and wife are becoming overwhelmed with the possibility of heavy duty care pt might require at d/c if pt unable to make functional gains and improvements in mobility, activity tolerance, and pain management.   Therapy Documentation Precautions:  Precautions Precautions: Back Precaution Comments: re-educated on BLT, pt cont to be reminded during function Required Braces or Orthoses: Spinal Brace Spinal Brace: Thoracolumbosacral orthotic, Applied in sitting position Restrictions Weight Bearing Restrictions: No   Therapy/Group: Individual Therapy  Jaselynn Tamas L 07/09/2018, 6:57 AM

## 2018-07-09 NOTE — Brief Op Note (Signed)
07/09/2018  8:20 PM  PATIENT:  Brian Brian [name]  73 y.o. male  PRE-OPERATIVE DIAGNOSIS:  spine tumor post operative  POST-OPERATIVE DIAGNOSIS:  * No post-op diagnosis entered *  PROCEDURE:  Procedure(s): LUMBAR WOUND DEBRIDEMENT (N/A)  SURGEON:  Surgeon(s) and Role:    Brian Schools, MD - Primary  PHYSICIAN ASSISTANT:   ASSISTANTS: none   ANESTHESIA:   general  EBL:  minimal   BLOOD ADMINISTERED:none  DRAINS: wound vac   LOCAL MEDICATIONS USED:  NONE  SPECIMEN:  Source of Specimen:  wound culture- superficial and deep   DISPOSITION OF SPECIMEN:  Microbiology  COUNTS:  YES  TOURNIQUET:  * No tourniquets in log *  DICTATION: .Dragon Dictation  PLAN OF CARE: Admit to inpatient   PATIENT DISPOSITION:  PACU - hemodynamically Brian [name].

## 2018-07-09 NOTE — Progress Notes (Signed)
Social Work Patient ID: Brian [name], male   DOB: 1945/08/17, 73 y.o.   MRN: [id number] Touched base with wife and pt aware surgery today and wanting everything to go ok. Both hopeful the pain will be lessen and he will be able to get back to the level he was prior to his set back. Did talk with Oncologist Wed and feel have a direction. Continue to provide support and work on needs.

## 2018-07-09 NOTE — Progress Notes (Addendum)
Daily Progress Note   Patient Name: Brian [name]       Date: 07/09/2018 DOB: 1946-04-24  Age: 73 y.o. MRN#: [id number] Attending Physician: Jamse Arn, MD Primary Care Physician: [name], MD Admit Date: 06/29/2018  Reason for Consultation/Follow-up: Pain management  Subjective: Patient is resting in bed. Wife spoke to me in the hall. She is tearful and upset about his surgical site, and feels that having to go to the OR is a setback. However she is grateful his is going to the OR.  She is concerned for the future, but optimistic. Brian [name] pain has increased but hoping surgery will help this. He and she state their questions have been answered as far as his cancer is concerned and they are aware of the prognosis of 1- 1.5 years.     Length of Stay: 10  Current Medications: Scheduled Meds:  . carvedilol  6.25 mg Oral BID WC  . cephALEXin  500 mg Oral Q6H  . clobetasol ointment   Topical BID  . DULoxetine  30 mg Oral Daily  . feeding supplement (PRO-STAT SUGAR FREE 64)  30 mL Oral BID  . fentaNYL  1 patch Transdermal Q72H  . furosemide  20 mg Oral Daily  . gabapentin  300 mg Oral TID  . guaiFENesin  10 mL Oral Q6H  . HYDROcodone-acetaminophen  1 tablet Oral QHS  . magnesium oxide  200 mg Oral Daily  . methocarbamol  750 mg Oral QID  . pantoprazole  40 mg Oral Daily  . polyethylene glycol  17 g Oral Once  . potassium chloride  30 mEq Oral BID  . senna  2 tablet Oral QHS    Continuous Infusions: . lactated ringers 100 mL/hr at 07/09/18 1156    PRN Meds: acetaminophen, albuterol, alum & mag hydroxide-simeth, bisacodyl, camphor-menthol, diphenhydrAMINE, diphenhydrAMINE, guaiFENesin-dextromethorphan, HYDROcodone-acetaminophen, HYDROmorphone HCl, polyethylene  glycol, prochlorperazine **OR** prochlorperazine **OR** prochlorperazine, sodium phosphate  Physical Exam Pulmonary:     Effort: Pulmonary effort is normal.  Skin:    General: Skin is warm and dry.     Findings: Rash present.             Vital Signs: BP 95/67   Pulse (!) 105   Temp 98.1 F (36.7 C)   Resp 16   Ht 5'  10" (1.778 m)   Wt 94.4 kg   SpO2 90%   BMI 29.86 kg/m  SpO2: SpO2: 90 % O2 Device: O2 Device: Room Air O2 Flow Rate: O2 Flow Rate (L/min): 2 L/min  Intake/output summary:   Intake/Output Summary (Last 24 hours) at 07/09/2018 1229 Last data filed at 07/09/2018 7741 Gross per 24 hour  Intake 680 ml  Output 1000 ml  Net -320 ml   LBM: Last BM Date: 07/08/18 Baseline Weight: Weight: 96.8 kg Most recent weight: Weight: 94.4 kg       Palliative Assessment/Data: 30%      Patient Active Problem List   Diagnosis Date Noted  . Right hip pain   . Sleep disturbance   . Peripheral edema   . Leukocytosis   . Hyponatremia   . Sinus tachycardia   . Hypomagnesemia   . Orthostatic hypotension   . Labile blood pressure   . Cancer associated pain   . DVT of lower extremity (deep venous thrombosis) ([place])   . Malignant neoplasm of lung ([place])   . Hypoalbuminemia due to protein-calorie malnutrition ([place])   . Acute blood loss anemia   . Postoperative pain   . Pathological compression fracture of lumbar vertebra ([place]) 06/29/2018  . Metastatic lung cancer (metastasis from lung to other site) ([place]) 06/28/2018  . Rash 06/25/2018  . Hypokalemia 06/25/2018  . Normocytic anemia 06/25/2018  . S/P lumbar fusion 06/24/2018  . Palliative care by specialist   . Cancer related pain   . Lung cancer, primary, with metastasis from lung to other site, right ([place]) 06/14/2018  . Squamous cell lung cancer, right ([place]) 06/14/2018  . Lung cancer metastatic to bone ([place]) 06/14/2018  . Bronchitis due to tobacco use 06/07/2018  . Lumbar spine tumor 06/04/2018    Palliative Care  Assessment & Plan   Recommendations/Plan:  Family is hopeful the surgery will make him feel better. They are grateful for oncology visit and for the antidepressant.  Will continue to follow for support and GOC.    Consult change noted. Pain management per rehab providers.   Code Status:    Code Status Orders  (From admission, onward)         Start     Ordered   06/29/18 1526  Full code  Continuous     06/29/18 1525        Code Status History    Date Active Date Inactive Code Status Order ID Comments User Context   06/11/2018 2031 06/29/2018 1440 Full Code [id number]  Milagros Loll, MD Inpatient   06/11/2018 2011 06/11/2018 2030 Full Code [id number]  Melina Schools, MD Inpatient   06/04/2018 1344 06/11/2018 2011 Full Code [id number]  Ardeen Jourdain, PA-C Inpatient       Prognosis:  Poor overall. Metastatic cancer with pathologic fracture diagnosed in December.   Discharge Planning:  To Be Determined  Care plan was discussed with RN  Thank you for allowing the Palliative Medicine Team to assist in the care of this patient.   Total Time 35 min Prolonged Time Billed no      Greater than 50%  of this time was spent counseling and coordinating care related to the above assessment and plan.  Asencion Gowda, NP  Please contact Palliative Medicine Team phone at [phone] for questions and concerns.

## 2018-07-09 NOTE — Op Note (Signed)
Operative report  Preoperative diagnosis: Posterior lumbar surgical wound infection  Postoperative diagnosis: Same  Operative procedure: Irrigation and debridement of posterior lumbar wound with application of wound VAC.  Complications: None  Intraoperative findings: Superficial and deep purulent material was expressed.  Irrigated the wound copiously normal saline and debrided grossly purulent material.  Primary of wound infection was the midline lumbar incision.  Superior left incision no evidence of deep and infection.  Wound was primarily closed.  Indications: Brian [name] is a very pleasant 73 year old gentleman who has metastatic lung cancer who over the last several weeks underwent a L4 corpectomy L3-5 anterior instrumentatio, posterior pedicle screw fusion and instrumentation L3-5, and recently L4-5 lumbar decompression and removal of tumor mass.  The patient was stable but noted to have purulent drainage today.  His last surgical procedure was approximately 15 days ago.  As a result of the purulent drainage and the evidence of wound dehiscence I like to take him back to the operating room.  All appropriate risks benefits and alternatives were explained to the patient and consent was obtained.  Operative report patient was brought the operating room placed upon the operating room table.  After successful induction of general anesthesia trach the patient the posterior lumbar wound was prepped and draped with scrub and paint.  Once the Betadine was allowed to dry the back was draped out.  The midline posterior incision was identified and the sutures were removed.  The wound edges were debrided and frank purulent material was expressed.  This was sent for aerobic and anaerobic cultures.  At this point I then irrigated the superficial wound with pulse lavage.  Once this was cleaned I debrided the wound edges as well as any purulent material that I could visualize.  I then removed the sutures in the deep  fascia and opened the deep fascia there was some small amount of purulent material expressed.  Again this was cultured both aerobic and anaerobic and sent to the lab.  At this point the deep fascia incised I used my finger to bluntly dissect and debrided some of the purulent material bluntly.  I then used irrigation with a bulb syringe a total of approximately 2 L to irrigate out this area.  Once all of the area was cleaned I then placed stimulant vancomycin beads into this wound.  Care was taken not to leave it over the exposed thecal sac.  I then placed 2  #1 Prolene sutures to loosely reapproximate the deep fascia.  I then placed the antibiotic beads in the superficial aspect and a wound VAC over this.  The superior left side minimally invasive pedicle screw incision was debrided.  No purulent material was expressed.  The wound edges were freshened using a 15 blade scalpel.  After pulse irrigating this wound I placed antibiotic beads into the wound and then closed the skin with a 0 Prolene running vertical mattress suture.  At this point with both wounds are dressed I remove the drapes and clean the skin edges Adaptic was placed over the left superior wound and then the wound VAC clear dressing was applied over the entire back.  It was then connected to the wound VAC device and was noted according to the nursing staff to be functioning appropriately.  The patient was extubated transfer the PACU without incident.  The end of the case all needle sponge counts were correct.  There were no adverse intraoperative events.

## 2018-07-09 NOTE — Anesthesia Preprocedure Evaluation (Signed)
Anesthesia Evaluation  Patient identified by MRN, date of birth, ID band Patient awake    Reviewed: Allergy & Precautions, NPO status , Patient's Chart, lab work & pertinent test results  Airway Mallampati: Intubated  TM Distance: >3 FB Neck ROM: Full    Dental no notable dental hx.    Pulmonary Current Smoker,  Intubated Lung cancer   Pulmonary exam normal breath sounds clear to auscultation       Cardiovascular negative cardio ROS Normal cardiovascular exam Rhythm:Regular Rate:Normal  ECG: SB, rate 57   Neuro/Psych negative neurological ROS  negative psych ROS   GI/Hepatic negative GI ROS, Neg liver ROS,   Endo/Other  negative endocrine ROS  Renal/GU negative Renal ROS     Musculoskeletal  (+) Arthritis , Rheumatoid disorders,  Metastatic cancer   Abdominal   Peds  Hematology  (+) Blood dyscrasia, anemia ,   Anesthesia Other Findings lumbar spine tumor  Reproductive/Obstetrics                             Anesthesia Physical  Anesthesia Plan  ASA: II  Anesthesia Plan: General   Post-op Pain Management:    Induction: Intravenous  PONV Risk Score and Plan: 2 and Ondansetron, Dexamethasone and Treatment may vary due to age or medical condition  Airway Management Planned: Oral ETT  Additional Equipment:   Intra-op Plan:   Post-operative Plan: Extubation in OR  Informed Consent: I have reviewed the patients History and Physical, chart, labs and discussed the procedure including the risks, benefits and alternatives for the proposed anesthesia with the patient or authorized representative who has indicated his/her understanding and acceptance.     Dental advisory given  Plan Discussed with: CRNA  Anesthesia Plan Comments:         Anesthesia Quick Evaluation

## 2018-07-09 NOTE — Consult Note (Signed)
[facility] Nurse wound consult note Patient receiving care in 4M10.  After reviewing the record, in particular, the note from Reesa Chew, [place] on 07/08/18 at 1:57 p.m., and from Melina Schools, MD, on 07/09/18 at 7:14 a.m., I spoke via phone with the primary RN, Hillary. Dr. Rolena Infante note indicates the patient is to go back to OR tonight for the wound. Reason for Consult: Recommendations for wound care Wound type: Incisional After reviewing Dr. Rolena Infante note and wound care orders, Ramiro Harvest feels the [facility] consult is no longer needed. Thank you for the consult.  Discussed plan of care with the bedside nurse.  [facility] nurse will not follow at this time.  Please re-consult the [facility] team if needed.  Val Riles, RN, MSN, CWOCN, CNS-BC, pager [phone]

## 2018-07-09 NOTE — Progress Notes (Signed)
Physical Therapy Session Note  Patient Details  Name: Brian [name] MRN: [id number] Date of Birth: [birth year]  Today's Date: 07/09/2018 PT Missed Time: 82 Minutes Missed Time Reason: Pain  Pt missed 60 min of skilled PT 2/2 pain. Pt awaiting surgery later in evening, NPO at this time and PT reminded him of that when he asked for some water. Pt in supine, declined any intervention for pain or repositioning.   Ameah Chanda Clent [place]ark 07/09/2018, 3:34 PM

## 2018-07-09 NOTE — Progress Notes (Signed)
Physical Therapy Session Note  Patient Details  Name: Brian [name] MRN: [id number] Date of Birth: 1946/06/08  Today's Date: 07/09/2018 PT Individual Time: 2909-0301 PT Individual Time Calculation (min): 20 min   Short Term Goals: Week 2:  PT Short Term Goal 1 (Week 2): supine> sit with min assist PT Short Term Goal 2 (Week 2): slide board transfer with mod assist consistently PT Short Term Goal 3 (Week 2): pt will be able to describe spinal precautions regarding functional movements  Skilled Therapeutic Interventions/Progress Updates:    Patient in supine c/o severe pain in hips and lower back.  Scheduled for surgery today.  Assisted in bed for repositioning for comfort with pt moaning and endorsing severe pain.  Spoke with PA and RN to get help with getting pain managed.  Again tried to encourage pt in sitting or supine activities including transfers with the lift.  Family in the room and very concerned regarding pain and multiple contacts themselves to PA.  Feel further skilled PT this session limited due to pain with 40 minutes missed.  Therapy Documentation Precautions:  Precautions Precautions: Back Precaution Comments: re-educated on BLT, pt cont to be reminded during function Required Braces or Orthoses: Spinal Brace Spinal Brace: Thoracolumbosacral orthotic, Applied in sitting position Restrictions Weight Bearing Restrictions: No General: PT Amount of Missed Time (min): 40 Minutes PT Missed Treatment Reason: Pain Pain: Pain Assessment Pain Scale: 0-10 Pain Score: 8  Pain Type: Acute pain;Surgical pain Pain Location: Back Pain Orientation: Right;Left;Lower Pain Descriptors / Indicators: Aching Pain Frequency: Constant Pain Onset: On-going Pain Intervention(s): Medication (See eMAR)    Therapy/Group: Individual Therapy  Reginia Naas  [place], [place] 07/09/2018  07/09/2018, 12:22 PM

## 2018-07-09 NOTE — Progress Notes (Signed)
  Patient arrived on unit from PACU approximately 2145. Received report from [place], [place].

## 2018-07-09 NOTE — Anesthesia Procedure Notes (Signed)
Procedure Name: Intubation Date/Time: 07/09/2018 7:13 PM Performed by: Oletta Lamas, CRNA Pre-anesthesia Checklist: Patient identified, Emergency Drugs available, Suction available and Patient being monitored Patient Re-evaluated:Patient Re-evaluated prior to induction Oxygen Delivery Method: Circle System Utilized Preoxygenation: Pre-oxygenation with 100% oxygen Induction Type: IV induction Ventilation: Mask ventilation without difficulty Laryngoscope Size: Miller and 2 Grade View: Grade I Tube type: Oral Tube size: 7.5 mm Number of attempts: 1 Airway Equipment and Method: Stylet Placement Confirmation: ETT inserted through vocal cords under direct vision,  positive ETCO2 and breath sounds checked- equal and bilateral Secured at: 22 cm Tube secured with: Tape Dental Injury: Teeth and Oropharynx as per pre-operative assessment

## 2018-07-09 NOTE — Transfer of Care (Signed)
Immediate Anesthesia Transfer of Care Note  Patient: Brian [name]  Procedure(s) Performed: LUMBAR WOUND DEBRIDEMENT (N/A )  Patient Location: PACU  Anesthesia Type:General  Level of Consciousness: drowsy, patient cooperative and responds to stimulation  Airway & Oxygen Therapy: Patient Spontanous Breathing and Patient connected to face mask oxygen  Post-op Assessment: Report given to RN and Post -op Vital signs reviewed and stable  Post vital signs: Reviewed and stable  Last Vitals:  Vitals Value Taken Time  BP    Temp    Pulse    Resp    SpO2      Last Pain:  Vitals:   07/09/18 1543  TempSrc:   PainSc: 8       Patients Stated Pain Goal: 2 (58/83/25 4982)  Complications: No apparent anesthesia complications

## 2018-07-09 NOTE — Progress Notes (Signed)
    Subjective:     Patient reports pain as 5 on 0-10 scale.  Reports unchanged leg pain reports incisional back pain   Positive void Positive bowel movement Positive flatus Negative chest pain or shortness of breath  Objective: Vital signs in last 24 hours: Temp:  [97.5 F (36.4 C)-98.2 F (36.8 C)] 98.1 F (36.7 C) (01/17 0523) Pulse Rate:  [91-102] 102 (01/17 0523) Resp:  [16-18] 16 (01/17 0523) BP: (90-103)/(61-67) 90/61 (01/17 0523) SpO2:  [88 %-92 %] 90 % (01/17 0523)  Intake/Output from previous day: 01/16 0701 - 01/17 0700 In: 440 [P.O.:240] Out: 1100 [Urine:1100]  Labs: Recent Labs    07/08/18 1459 07/09/18 0601  WBC 13.3* 12.5*  RBC 3.20* 3.06*  HCT 29.4* 28.2*  PLT 411* 449*   Recent Labs    07/08/18 0712  NA 135  K 3.7  CL 103  CO2 24  BUN 9  CREATININE 0.63  GLUCOSE 104*  CALCIUM 7.5*   No results for input(s): LABPT, INR in the last 72 hours.  Physical Exam: ABD soft Intact pulses distally Incision: moderate drainage Compartment soft Body mass index is 29.86 kg/m.   Assessment/Plan: Patient stable  xrays n/a Continue mobilization with physical therapy Continue care  1. Plan on return to [place] to address the wound infection.  Most likely I+D and application of wound vac.  My hope is this will allow for improved wound healing and address the superficial infection.  Of course it means a return to the OR for closure.  I have explained this to him and he is in agreement. 2. I agree with Dr Marin Olp that he may require transfusion and at the very least IV iron treatment (oral iron caused significant GI distress).   3. Recommend nutritional consult 4. Continue excellent care he is receiving on rehab.  Will re-admit to rehab after I&D.  Melina Schools, MD [facility] [phone]

## 2018-07-10 ENCOUNTER — Inpatient Hospital Stay (HOSPITAL_COMMUNITY): Payer: Medicare Other

## 2018-07-10 ENCOUNTER — Inpatient Hospital Stay (HOSPITAL_COMMUNITY): Payer: Medicare Other | Admitting: Occupational Therapy

## 2018-07-10 MED ORDER — CIPROFLOXACIN IN D5W 200 MG/100ML IV SOLN
200.0000 mg | Freq: Two times a day (BID) | INTRAVENOUS | Status: DC
  Administered 2018-07-10 – 2018-07-12 (×4): 200 mg via INTRAVENOUS
  Filled 2018-07-10 (×5): qty 100

## 2018-07-10 NOTE — Progress Notes (Addendum)
[place] PHYSICAL MEDICINE & REHABILITATION PROGRESS NOTE  Subjective/Complaints: Patient had a better night.  Pain much improved.  Family at bedside.  Patient ready to pursue therapies today.  ROS: Patient denies fever, rash, sore throat, blurred vision, nausea, vomiting, diarrhea, cough, shortness of breath or chest pain, joint or back pain, headache, or mood change.   Objective: Vital Signs: Blood pressure (!) 97/57, pulse 88, temperature (!) 97.5 F (36.4 C), temperature source Oral, resp. rate 16, height 5\' 10"  (1.778 m), weight 94.4 kg, SpO2 92 %. Dg Chest 1 View  Result Date: 07/08/2018 CLINICAL DATA:  Fever, posterior RIGHT hip pain, BILATERAL shoulder pain and shortness of breath for 16 weeks EXAM: CHEST  1 VIEW COMPARISON:  07/05/2018 FINDINGS: Upper normal heart size. Normal mediastinal contours and pulmonary vascularity. Interstitial infiltrates in the mid to lower lungs new since previous study. Prominent hila. RIGHT infrahilar density corresponding to mass on previous exam. Upper lungs clear. Bones demineralized. IMPRESSION: RIGHT lower lobe mass as seen previously. Interstitial infiltrates in the mid to lower lungs which could represent infection or edema. Electronically Signed   By: Lavonia Dana M.D.   On: 07/08/2018 20:06   Dg Hip Unilat With Pelvis 2-3 Views Right  Result Date: 07/08/2018 CLINICAL DATA:  Right posterior hip pain. No known injury. History of metastatic lung cancer. EXAM: DG HIP (WITH OR WITHOUT PELVIS) 2-3V RIGHT COMPARISON:  04/13/2018. FINDINGS: Minimal right femoral head and neck junction spur formation. Mild joint space narrowing. No fracture or dislocation. No visible bone metastases. Lower lumbar spine fixation hardware. Atheromatous arterial calcifications. IMPRESSION: 1. Minimal right hip degenerative changes. 2. Lower lumbar spine fixation hardware. Electronically Signed   By: Claudie Revering M.D.   On: 07/08/2018 20:03   Recent Labs    07/08/18 1459  07/09/18 0601  WBC 13.3* 12.5*  HGB 8.9* 8.8*  HCT 29.4* 28.2*  PLT 411* 449*   Recent Labs    07/08/18 0712 07/09/18 0601  NA 135 135  K 3.7 4.3  CL 103 104  CO2 24 23  GLUCOSE 104* 111*  BUN 9 13  CREATININE 0.63 0.70  CALCIUM 7.5* 7.4*    Physical Exam: BP (!) 97/57 (BP Location: Right Arm)   Pulse 88   Temp (!) 97.5 F (36.4 C) (Oral)   Resp 16   Ht 5\' 10"  (1.778 m)   Wt 94.4 kg   SpO2 92%   BMI 29.86 kg/m  Constitutional: No distress . Vital signs reviewed. HEENT: EOMI, oral membranes moist Neck: supple Cardiovascular: RRR without murmur. No JVD    Respiratory: CTA Bilaterally without wheezes or rales. Normal effort    GI: BS +, non-tender, non-distended  Musculoskeletal: Bilateral lower extremity edema, left greater than right, stable at 1+ Neurological: He is alert and oriented  Dysphonia Able to follow basic commands without difficulty.  Motor: Bilateral UE 5/5 proximal to distal, stable Right lower extremity: 4-/5 proximal distal, unchanged Left lower extremity: Hip flexion 3-/5, knee extension 4-/5, ankle dorsiflexion 4/5, unchanged Skin: Generalized rash, stable Back incision with vacuum in place Psychiatric: Generally pleasant and cooperative.  Assessment/Plan: 1. Functional deficits secondary to lung cancer with mets to ribs, vertebrae with oncological fracture status post lumbar fusion which require 3+ hours per day of interdisciplinary therapy in a comprehensive inpatient rehab setting.  Physiatrist is providing close team supervision and 24 hour management of active medical problems listed below.  Physiatrist and rehab team continue to assess barriers to discharge/monitor patient progress toward  functional and medical goals  Care Tool:  Bathing  Bathing activity did not occur: Safety/medical concerns(Unable to assess 2/2 drop in BP with upright positioning) Body parts bathed by patient: Right arm, Left arm, Chest, Abdomen, Front  perineal area, Right upper leg, Left upper leg, Face   Body parts bathed by helper: Buttocks, Right lower leg, Left lower leg     Bathing assist Assist Level: Maximal Assistance - Patient 24 - 49%     Upper Body Dressing/Undressing Upper body dressing Upper body dressing/undressing activity did not occur (including orthotics): Safety/medical concerns(B/P issues ) What is the patient wearing?: Pull over shirt, Orthosis Orthosis activity level: Performed by helper  Upper body assist Assist Level: Minimal Assistance - Patient > 75%    Lower Body Dressing/Undressing Lower body dressing      What is the patient wearing?: Pants     Lower body assist Assist for lower body dressing: Minimal Assistance - Patient > 75%     Toileting Toileting Toileting Activity did not occur (Clothing management and hygiene only): Safety/medical concerns(Unable to assess 2/2 drop in BP with upright positioning)  Toileting assist Assist for toileting: 2 Helpers     Transfers Chair/bed transfer  Transfers assist     Chair/bed transfer assist level: 2 Helpers(bariatric Stedy from shower chair)     Locomotion Ambulation   Ambulation assist   Ambulation activity did not occur: Safety/medical concerns  Assist level: Minimal Assistance - Patient > 75%(w/c follow for safety) Assistive device: Walker-rolling Max distance: 18   Walk 10 feet activity   Assist     Assist level: Minimal Assistance - Patient > 75% Assistive device: Walker-rolling   Walk 50 feet activity   Assist           Walk 150 feet activity   Assist           Walk 10 feet on uneven surface  activity   Assist           Wheelchair     Assist Will patient use wheelchair at discharge?: Yes Type of Wheelchair: Manual Wheelchair activity did not occur: Safety/medical concerns(hypotensive)  Wheelchair assist level: Supervision/Verbal cueing Max wheelchair distance: 75    Wheelchair 50 feet  with 2 turns activity    Assist    Wheelchair 50 feet with 2 turns activity did not occur: Safety/medical concerns   Assist Level: Supervision/Verbal cueing   Wheelchair 150 feet activity     Assist Wheelchair 150 feet activity did not occur: Safety/medical concerns   Assist Level: Supervision/Verbal cueing      Medical Problem List and Plan: 1.Functional and mobility deficitssecondary to metastatic lung cancer to left clavicle, ribs, L4 vertebral body with fracture. Pt is s/p L4 corpectomy and L3-5 fusion by Dr. Rolena Infante on 06/11/18  Continue CIR  Per hematology/oncology, will await improvement in function and strength prior to chemotherapy.   rad-onc involved.  Poor prognosis.  Status post I&D of back wound last night 2. DVT: dopplers done this evening and patient has DVT in left CFV             IVC filter placement on 1/8  Pt not a candidate for anticoagulation due to hematoma and bleeding riskat present although long term he is high VTE risk 3. Pain Management: dilaudid prn. Unable to tolerate Oxycodone   Fentanyl increased to 25 mcg on 1/17  Appreciate palliative care consult- notes reviewed- Robaxin frequency increased,  Norco dose increased   Cymbalta added on 1/17  -  Patient more comfortable overnight and this morning 4. Mood:LCSW to follow for evaluation and support.  Cymbalta added 5. Neuropsych: This patientiscapable of making decisions on hisown behalf. 6. Skin/Wound Care:vac in place on back wound per ortho. 7. Fluids/Electrolytes/Nutrition:Monitor I/O.  8.Blood pressure; Intermittent elevation noted   Orthostatic vital signs ordered on 1/10   Abdominal binder as needed  Monitor with increased mobility 9. ABLA: Continue to monitor with serial checks.   Hemoglobin 9.5 on 1/16  Iron studies consistent with anemia of chronic disease: Ferritin levels very high 10. Hypokalemia:   Potassium 4.3 on 1/17   Supplement increased on 1/13  Magnesium 1.6  on 1/14   Supplement initiated on 1/14 11. Rash: Continue Clobetasol bidas well as hypoallergenic sheets. 12. Neuropathic pain: Improving on gabapentin 300 mg tid 13. Constipation:Managed with miralax prn--patient wants to manage his bowel program. 14.  Hypoalbuminemia  Supplement initiated on 1/8 15.  Sinus tachycardia  Metoprolol 12.5 twice daily started on 1/13, changed to Coreg on 1/15  Improving on 1/18 16.  Hyponatremia  Sodium 135 on 1/17  Continue to monitor 17.  Leukocytosis  WBCs 12.5 on 1/17  UA with rare bacteria, urine culture pending   Chest x-ray reviewed with patient, unremarkable for acute infectious process, repeat chest x-ray on 1/16 reviewed showing some lower abnormality-continue to monitor  Afebrile  Back incision as above 18. Peripheral edema  Lasix 20 daily started on 1/15, local care and elevation  19.  Sleep disturbance  Trazodone DC'd, Benadryl ordered for pruritus and sleep on 1/17 20.  Pruritus.  Secondary to pain medications  Benadryl as needed, dose increased on 1/17  Will consider steroids if necessary 21.  Multiple joint pain  Multifactorial  Hip x-ray reviewed, some degenerative changes  LOS: 11 days A FACE TO FACE EVALUATION WAS PERFORMED  Meredith Staggers 07/10/2018, 8:49 AM

## 2018-07-10 NOTE — Plan of Care (Signed)
  Problem: RH SKIN INTEGRITY Goal: RH STG SKIN FREE OF INFECTION/BREAKDOWN Description With mod. Assist.  Outcome: Progressing; pt on wound vac continous Problem: RH PAIN MANAGEMENT Goal: RH STG PAIN MANAGED AT OR BELOW PT'S PAIN GOAL Description Less than 3,on 1 to 10 scale.  Outcome: Progressing; pain scale 6/10

## 2018-07-10 NOTE — Progress Notes (Signed)
Physical Therapy Session Note  Patient Details  Name: Brian [name] MRN: [id number] Date of Birth: 1946-03-30  Today's Date: 07/10/2018 PT Individual Time: 0800-0900 PT Individual Time Calculation (min): 60 min   Short Term Goals: Week 2:  PT Short Term Goal 1 (Week 2): supine> sit with min assist PT Short Term Goal 2 (Week 2): slide board transfer with mod assist consistently PT Short Term Goal 3 (Week 2): pt will be able to describe spinal precautions regarding functional movements  Skilled Therapeutic Interventions/Progress Updates:    Pt supine in bed upon PT arrival, agreeable to therapy tx and reports pain 4/10 in low back. Pt supine in bed performed rolling in each direction with min assist and use of bedrails while therapist donned pants and brief. Pt transferred from supine>sidelying>sitting with mod assist, maintaining back precautions. In sitting pt donned shirt with supervision. Therapist assisted pt to don abdominal binder and TLSO total assist. Pt performed slideboard transfer to the w/c with min assist, lateral scoot. Pt transported to the gym. Pt performed slideboard transfer lateral scoot to the mat with min assist. Pt performed sit<>stand x 1 this session from elevated mat with RW and mod assist. In standing pt worked on standing tolerance and performed marches in place for strength and balance. Pt performed slideboard transfers back to w/c with min assist and transported back to room. Pt left seated in w/c at end of session in care of family with needs in reach.   Therapy Documentation Precautions:  Precautions Precautions: Back Precaution Comments: re-educated on BLT, pt cont to be reminded during function Required Braces or Orthoses: Spinal Brace Spinal Brace: Thoracolumbosacral orthotic, Applied in sitting position Restrictions Weight Bearing Restrictions: No   Therapy/Group: Individual Therapy  Drema Dallas Schagen 07/10/2018, 7:57 AM

## 2018-07-10 NOTE — Progress Notes (Signed)
Subjective: 1 Day Post-Op Procedure(s) (LRB): LUMBAR WOUND DEBRIDEMENT (N/A) Patient reports pain as minimal to low back.  Tolerating PO's. Progress with PT. Denies numbness or tingling. Voided this am. BM 1/16. Wife at bedside. Denies SOB or CP.  Objective: Vital signs in last 24 hours: Temp:  [97.5 F (36.4 C)-98.6 F (37 C)] 97.5 F (36.4 C) (01/18 0433) Pulse Rate:  [84-97] 88 (01/18 0433) Resp:  [13-18] 16 (01/18 0433) BP: (97-112)/(56-75) 97/57 (01/18 0433) SpO2:  [88 %-99 %] 92 % (01/18 0433)  Intake/Output from previous day: 01/17 0701 - 01/18 0700 In: 1040 [P.O.:240; I.V.:800] Out: 1070 [Urine:1050; Blood:20] Intake/Output this shift: No intake/output data recorded.  Recent Labs    07/08/18 0712 07/08/18 1459 07/09/18 0601  HGB 9.5* 8.9* 8.8*   Recent Labs    07/08/18 1459 07/09/18 0601  WBC 13.3* 12.5*  RBC 3.20* 3.06*  HCT 29.4* 28.2*  PLT 411* 449*   Recent Labs    07/08/18 0712 07/09/18 0601  NA 135 135  K 3.7 4.3  CL 103 104  CO2 24 23  BUN 9 13  CREATININE 0.63 0.70  GLUCOSE 104* 111*  CALCIUM 7.5* 7.4*   No results for input(s): LABPT, INR in the last 72 hours.  Well nourished. Alert and oriented x3. RRR, Lungs clear, BS x4. Abdomen soft and non tender. bilateral Calf soft and non tender. Back dressing C/D/I. No DVT signs. Compartment soft. No signs of infection.  Bilateral LE grossly neurovascular intact. Wound Vac in place: OP: 41ml.  Anticipated LOS equal to or greater than 2 midnights due to - Age 74 and older with one or more of the following:  - Obesity  - Expected need for hospital services (PT, OT, Nursing) required for safe  discharge  - Anticipated need for postoperative skilled nursing care or inpatient rehab  - Active co-morbidities: None OR   - Unanticipated findings during/Post Surgery: None  - Patient is a high risk of re-admission due to: None   Assessment/Plan: 1 Day Post-Op Procedure(s) (LRB): LUMBAR WOUND  DEBRIDEMENT (N/A) Advance diet Up with therapy D/C IV fluids  Vac dressing changed as ordered (Monday) Monitor Vac OP Dr.Brooks updated Wife at bedside     [place] 07/10/2018, 8:53 AM

## 2018-07-10 NOTE — Progress Notes (Signed)
Occupational Therapy Session Note  Patient Details  Name: Brian [name] MRN: [id number] Date of Birth: 08/22/1945  Today's Date: 07/10/2018 OT Individual Time: 1300-1415 OT Individual Time Calculation (min): 75 min    Short Term Goals: Week 1:  OT Short Term Goal 1 (Week 1): Pt will complete transfer to toillet/BSC with mod A using LRAD OT Short Term Goal 1 - Progress (Week 1): Not met OT Short Term Goal 2 (Week 1): Pt will thread B LEs into pants with steadying assisting using AE PRN OT Short Term Goal 2 - Progress (Week 1): Met OT Short Term Goal 3 (Week 1): Pt will tolerate standing at sink to complete 1 grooming task in standing with steadying assist OT Short Term Goal 3 - Progress (Week 1): Not met  Skilled Therapeutic Interventions/Progress Updates:    Patient in bed at start of session.  Pain as indicated below but agreeable to get OOB and participate in therapy session.  Wife present and supportive t/o session.   Bed mobility - supine to side lying with CS, mod A for side lyiing to SSP.  Brace & abdominal binder - donned seated EOB dependent / good sitting balance.  Transfer - SB bed to w/c mod A of one with stand by of 2nd person.   2 L O2 via [place] t/o session.  Patient requires assistance to propel w/c to therapy gym.  Completed UE and LE AROM exercises with focus on positioning and pain control.  Able to tolerate 3-4 # for bicep and wrist exercises.  No weight for LEs or proximal UEs. Completed education for patient and wife related to positioning and repositioning for comfort - wife able to adjust leg rests to assist with position change.  Patient completed weight shift activities in w/c with noted relief.  Patient returned to room and remained in w/c with wife present.  O2 transfer to wall unit.  Patient states that session met expectations and he enjoyed the activity   Therapy Documentation Precautions:  Precautions Precautions: Back Precaution Comments: re-educated on BLT,  pt cont to be reminded during function Required Braces or Orthoses: Spinal Brace Spinal Brace: Thoracolumbosacral orthotic, Applied in sitting position Restrictions Weight Bearing Restrictions: No General:   Vital Signs: Therapy Vitals Temp: 98 F (36.7 C) Temp Source: Oral Pulse Rate: 92 Resp: 18 BP: 117/78 Patient Position (if appropriate): Lying Oxygen Therapy SpO2: 95 % O2 Device: Room Air Pain: Pain Assessment Pain Scale: 0-10 Pain Score: 6  Pain Type: Surgical pain Pain Location: Back Pain Orientation: Lower Pain Descriptors / Indicators: Aching Pain Frequency: Constant Pain Onset: On-going Pain Intervention(s): Repositioned;Environmental changes  Therapy/Group: Individual Therapy  Carlos Levering 07/10/2018, 3:28 PM

## 2018-07-11 ENCOUNTER — Inpatient Hospital Stay (HOSPITAL_COMMUNITY): Payer: Medicare Other

## 2018-07-11 LAB — AEROBIC CULTURE W GRAM STAIN (SUPERFICIAL SPECIMEN)

## 2018-07-11 NOTE — Plan of Care (Signed)
  Problem: SCI BOWEL ELIMINATION Goal: RH STG MANAGE BOWEL WITH ASSISTANCE Description STG Manage Bowel with Mod.Assistance.  Outcome: Not Progressing; constipation LBM 1/16   Problem: RH PAIN MANAGEMENT Goal: RH STG PAIN MANAGED AT OR BELOW PT'S PAIN GOAL Description Less than 3,on 1 to 10 scale.  Outcome: Not Progressing; pain constant

## 2018-07-11 NOTE — Anesthesia Postprocedure Evaluation (Signed)
Anesthesia Post Note  Patient: Brian [name]  Procedure(s) Performed: LUMBAR WOUND DEBRIDEMENT (N/A )     Patient location during evaluation: PACU Anesthesia Type: General Level of consciousness: sedated and patient cooperative Pain management: pain level controlled Vital Signs Assessment: post-procedure vital signs reviewed and stable Respiratory status: spontaneous breathing Cardiovascular status: stable Anesthetic complications: no    Last Vitals:  Vitals:   07/11/18 1609 07/11/18 2057  BP: 113/74 (!) 97/54  Pulse: 92 88  Resp: 18 19  Temp: 36.7 C 36.6 C  SpO2: 95% 96%    Last Pain:  Vitals:   07/11/18 2105  TempSrc:   PainSc: [place]

## 2018-07-11 NOTE — Progress Notes (Signed)
Physical Therapy Session Note  Patient Details  Name: Brian [name] MRN: [id number] Date of Birth: 09/12/1945  Today's Date: 07/11/2018 PT Individual Time: 0900-1014 PT Individual Time Calculation (min): 74 min   Short Term Goals: Week 2:  PT Short Term Goal 1 (Week 2): supine> sit with min assist PT Short Term Goal 2 (Week 2): slide board transfer with mod assist consistently PT Short Term Goal 3 (Week 2): pt will be able to describe spinal precautions regarding functional movements  Skilled Therapeutic Interventions/Progress Updates:    Pt supine in bed upon PT arrival, agreeable to therapy tx and reports pain 6/10 in low back. Pt transferred from supine>sidelying>sitting with mod assist, maintaining back precautions. In sitting pt donned shirt with supervision. Therapist assisted pt to don abdominal binder and TLSO total assist. Therapist donned teds and socks total assist. Pt performed slideboard transfer to the w/c with min assist, lateral scoot. Pt transported to the gym. Pt performed slideboard transfer lateral scoot to the mat with min assist. Pt performed sit<>stand x 2 this session from elevated mat (23in) with RW and mod assist. In standing pt worked on standing tolerance and performed marches in place for strength and balance, pt's R knee buckling this session. Pt worked on seated balance and upright activity tolerance to engage in horseshoe toss activity, x 2 trials with supervision-CGA for balance. Pt performed slideboard transfer back to w/c with min assist and increased time. Pt transported back to room and left seated in w/c in care of family.   Therapy Documentation Precautions:  Precautions Precautions: Back Precaution Comments: re-educated on BLT, pt cont to be reminded during function Required Braces or Orthoses: Spinal Brace Spinal Brace: Thoracolumbosacral orthotic, Applied in sitting position Restrictions Weight Bearing Restrictions: No   Therapy/Group:  Individual Therapy  Netta Corrigan, PT, DPT 07/11/2018, 9:03 AM

## 2018-07-11 NOTE — Progress Notes (Signed)
Occupational Therapy Session Note  Patient Details  Name: Brian [name] MRN: [id number] Date of Birth: 06/22/1946  Today's Date: 07/11/2018 OT Individual Time: 1101-1205 OT Individual Time Calculation (min): 64 min    Short Term Goals: Week 1:  OT Short Term Goal 1 (Week 1): Pt will complete transfer to toillet/BSC with mod A using LRAD OT Short Term Goal 1 - Progress (Week 1): Not met OT Short Term Goal 2 (Week 1): Pt will thread B LEs into pants with steadying assisting using AE PRN OT Short Term Goal 2 - Progress (Week 1): Met OT Short Term Goal 3 (Week 1): Pt will tolerate standing at sink to complete 1 grooming task in standing with steadying assist OT Short Term Goal 3 - Progress (Week 1): Not met  Skilled Therapeutic Interventions/Progress Updates:    1:1. Pt received in w/c just received pain medication and reporting 4/10 back pain. Repositioned for comfort at end of session as well as rest provided PRN during activity. Pt completes oral care at sink with set up. Pt completes slide board transfers w/c<>EOM/B with MIN A and +2 as SBA with VC for LE management throughout transfers. Pt sit to stand MOD A of 2 and maintains standing balance with CGA during lateral weight shifting, toe taps to taget and toe taps to 2 inch step. No knee buckling noted. Pt requires VC for wider base of support. Standing activities in prep for functional stand step transfers. Pt compltes lateral reaching for cards in sitting while matching to board in front of pt for sitting balance. Exited session with pt seated in bed, call light in reach and family present in room  Therapy Documentation Precautions:  Precautions Precautions: Back Precaution Comments: re-educated on BLT, pt cont to be reminded during function Required Braces or Orthoses: Spinal Brace Spinal Brace: Thoracolumbosacral orthotic, Applied in sitting position Restrictions Weight Bearing Restrictions: No General:   Praxis    Exercises:   Other Treatments:     Therapy/Group: Individual Therapy  Tonny Branch 07/11/2018, 12:11 PM

## 2018-07-11 NOTE — Progress Notes (Signed)
[place] PHYSICAL MEDICINE & REHABILITATION PROGRESS NOTE  Subjective/Complaints: Patient had a reasonable night.  Was able to make it through therapies yesterday.  Overall pain control has improved.  ROS: Patient denies fever, rash, sore throat, blurred vision, nausea, vomiting, diarrhea, cough, shortness of breath or chest pain, joint or back pain, headache, or mood change.    Objective: Vital Signs: Blood pressure 111/72, pulse 87, temperature 97.7 F (36.5 C), temperature source Oral, resp. rate 16, height 5\' 10"  (1.778 m), weight 94.4 kg, SpO2 97 %. No results found. Recent Labs    07/08/18 1459 07/09/18 0601  WBC 13.3* 12.5*  HGB 8.9* 8.8*  HCT 29.4* 28.2*  PLT 411* 449*   Recent Labs    07/09/18 0601  NA 135  K 4.3  CL 104  CO2 23  GLUCOSE 111*  BUN 13  CREATININE 0.70  CALCIUM 7.4*    Physical Exam: BP 111/72 (BP Location: Right Arm)   Pulse 87   Temp 97.7 F (36.5 C) (Oral)   Resp 16   Ht 5\' 10"  (1.778 m)   Wt 94.4 kg   SpO2 97%   BMI 29.86 kg/m  Constitutional: No distress . Vital signs reviewed. HEENT: EOMI, oral membranes moist Neck: supple Cardiovascular: RRR without murmur. No JVD    Respiratory: CTA Bilaterally without wheezes or rales. Normal effort    GI: BS +, non-tender, non-distended  Musculoskeletal: Bilateral lower extremity edema, left greater than right, stable at 1+ Neurological: He is alert and oriented  Dysphonia Able to follow basic commands without difficulty.  Motor: Bilateral UE 5/5 proximal to distal, stable Right lower extremity: 4-/5 proximal distal, stable Left lower extremity: Hip flexion 3-/5, knee extension 4-/5, ankle dorsiflexion 4/5, stable Skin: Generalized rash, no change Back incision with vacuum in place--mild serosanguineous drainage in canister Psychiatric: Generally pleasant and cooperative.  Assessment/Plan: 1. Functional deficits secondary to lung cancer with mets to ribs, vertebrae with  oncological fracture status post lumbar fusion which require 3+ hours per day of interdisciplinary therapy in a comprehensive inpatient rehab setting.  Physiatrist is providing close team supervision and 24 hour management of active medical problems listed below.  Physiatrist and rehab team continue to assess barriers to discharge/monitor patient progress toward functional and medical goals  Care Tool:  Bathing  Bathing activity did not occur: Safety/medical concerns(Unable to assess 2/2 drop in BP with upright positioning) Body parts bathed by patient: Right arm, Left arm, Chest, Abdomen, Front perineal area, Right upper leg, Left upper leg, Face   Body parts bathed by helper: Buttocks, Right lower leg, Left lower leg     Bathing assist Assist Level: Maximal Assistance - Patient 24 - 49%     Upper Body Dressing/Undressing Upper body dressing Upper body dressing/undressing activity did not occur (including orthotics): Safety/medical concerns(B/P issues ) What is the patient wearing?: Pull over shirt, Orthosis Orthosis activity level: Performed by helper  Upper body assist Assist Level: Minimal Assistance - Patient > 75%    Lower Body Dressing/Undressing Lower body dressing      What is the patient wearing?: Pants     Lower body assist Assist for lower body dressing: Minimal Assistance - Patient > 75%     Toileting Toileting Toileting Activity did not occur (Clothing management and hygiene only): Safety/medical concerns(Unable to assess 2/2 drop in BP with upright positioning)  Toileting assist Assist for toileting: 2 Helpers     Transfers Chair/bed transfer  Transfers assist     Chair/bed  transfer assist level: Minimal Assistance - Patient > 75%(slideboard)     Locomotion Ambulation   Ambulation assist   Ambulation activity did not occur: Safety/medical concerns  Assist level: Minimal Assistance - Patient > 75%(w/c follow for safety) Assistive device:  Walker-rolling Max distance: 18   Walk 10 feet activity   Assist     Assist level: Minimal Assistance - Patient > 75% Assistive device: Walker-rolling   Walk 50 feet activity   Assist           Walk 150 feet activity   Assist           Walk 10 feet on uneven surface  activity   Assist           Wheelchair     Assist Will patient use wheelchair at discharge?: Yes Type of Wheelchair: Manual Wheelchair activity did not occur: Safety/medical concerns(hypotensive)  Wheelchair assist level: Supervision/Verbal cueing Max wheelchair distance: 75    Wheelchair 50 feet with 2 turns activity    Assist    Wheelchair 50 feet with 2 turns activity did not occur: Safety/medical concerns   Assist Level: Supervision/Verbal cueing   Wheelchair 150 feet activity     Assist Wheelchair 150 feet activity did not occur: Safety/medical concerns   Assist Level: Supervision/Verbal cueing      Medical Problem List and Plan: 1.Functional and mobility deficitssecondary to metastatic lung cancer to left clavicle, ribs, L4 vertebral body with fracture. Pt is s/p L4 corpectomy and L3-5 fusion by Dr. Rolena Infante on 06/11/18  Continue CIR  Per hematology/oncology, will await improvement in function and strength prior to chemotherapy.   rad-onc involved.  Poor prognosis.  Status post I&D of back wound 1/17 2. DVT: dopplers done this evening and patient has DVT in left CFV             IVC filter placement on 1/8  Pt not a candidate for anticoagulation due to hematoma and bleeding riskat present although long term he is high VTE risk 3. Pain Management: dilaudid prn. Unable to tolerate Oxycodone   Fentanyl increased to 25 mcg on 1/17  Appreciate palliative care consult- notes reviewed- Robaxin frequency increased,  Norco dose increased   Cymbalta added on 1/17  -Patient's pain is better controlled with recent changes 4. Mood:LCSW to follow for evaluation and  support.  Cymbalta added 5. Neuropsych: This patientiscapable of making decisions on hisown behalf. 6. Skin/Wound Care:vac in place on back wound per ortho. 7. Fluids/Electrolytes/Nutrition:Monitor I/O.  8.Blood pressure; Intermittent elevation noted   Orthostatic vital signs ordered on 1/10   Abdominal binder as needed  Monitor with increased mobility 9. ABLA: Continue to monitor with serial checks.   Hemoglobin 9.5 on 1/16  Iron studies consistent with anemia of chronic disease: Ferritin levels very high 10. Hypokalemia:   Potassium 4.3 on 1/17   Supplement increased on 1/13  Magnesium 1.6 on 1/14   Supplement initiated on 1/14 11. Rash: Continue Clobetasol bidas well as hypoallergenic sheets. 12. Neuropathic pain: Improving on gabapentin 300 mg tid 13. Constipation:Managed with miralax prn--patient wants to manage his bowel program. 14.  Hypoalbuminemia  Supplement initiated on 1/8 15.  Sinus tachycardia  Metoprolol 12.5 twice daily started on 1/13, changed to Coreg on 1/15  Improved on 1/19 16.  Hyponatremia  Sodium 135 on 1/17  Continue to monitor 17.  Leukocytosis  WBCs 12.5 on 1/17  UA with rare bacteria, urine culture pending   Chest x-ray reviewed with patient, unremarkable for  acute infectious process, repeat chest x-ray on 1/16 reviewed showing some lower abnormality-continue to monitor  Afebrile  Back incision as above 18. Peripheral edema  Lasix 20 daily started on 1/15, local care and elevation  19.  Sleep disturbance  Trazodone DC'd, Benadryl ordered for pruritus and sleep on 1/17 20.  Pruritus.  Secondary to pain medications  Benadryl as needed, dose increased on 1/17  Will consider steroids if necessary 21.  Multiple joint pain  Multifactorial  Hip x-ray with degenerative changes  LOS: 12 days A FACE TO FACE EVALUATION WAS PERFORMED  Meredith Staggers 07/11/2018, 8:54 AM

## 2018-07-12 ENCOUNTER — Inpatient Hospital Stay (HOSPITAL_COMMUNITY): Payer: Medicare Other | Admitting: Occupational Therapy

## 2018-07-12 ENCOUNTER — Inpatient Hospital Stay (HOSPITAL_COMMUNITY): Payer: Medicare Other

## 2018-07-12 ENCOUNTER — Encounter (HOSPITAL_COMMUNITY): Payer: Self-pay | Admitting: Orthopedic Surgery

## 2018-07-12 DIAGNOSIS — T8149XA Infection following a procedure, other surgical site, initial encounter: Secondary | ICD-10-CM

## 2018-07-12 DIAGNOSIS — Z888 Allergy status to other drugs, medicaments and biological substances status: Secondary | ICD-10-CM

## 2018-07-12 DIAGNOSIS — B961 Klebsiella pneumoniae [K. pneumoniae] as the cause of diseases classified elsewhere: Secondary | ICD-10-CM

## 2018-07-12 DIAGNOSIS — Z720 Tobacco use: Secondary | ICD-10-CM

## 2018-07-12 DIAGNOSIS — R197 Diarrhea, unspecified: Secondary | ICD-10-CM

## 2018-07-12 DIAGNOSIS — Z885 Allergy status to narcotic agent status: Secondary | ICD-10-CM

## 2018-07-12 DIAGNOSIS — Z1611 Resistance to penicillins: Secondary | ICD-10-CM

## 2018-07-12 DIAGNOSIS — C7951 Secondary malignant neoplasm of bone: Secondary | ICD-10-CM

## 2018-07-12 DIAGNOSIS — M069 Rheumatoid arthritis, unspecified: Secondary | ICD-10-CM

## 2018-07-12 DIAGNOSIS — Z981 Arthrodesis status: Secondary | ICD-10-CM

## 2018-07-12 DIAGNOSIS — K769 Liver disease, unspecified: Secondary | ICD-10-CM

## 2018-07-12 DIAGNOSIS — B9620 Unspecified Escherichia coli [E. coli] as the cause of diseases classified elsewhere: Secondary | ICD-10-CM

## 2018-07-12 DIAGNOSIS — Z978 Presence of other specified devices: Secondary | ICD-10-CM

## 2018-07-12 DIAGNOSIS — Z91048 Other nonmedicinal substance allergy status: Secondary | ICD-10-CM

## 2018-07-12 DIAGNOSIS — J439 Emphysema, unspecified: Secondary | ICD-10-CM

## 2018-07-12 DIAGNOSIS — Z881 Allergy status to other antibiotic agents status: Secondary | ICD-10-CM

## 2018-07-12 LAB — CBC
HCT: 30 % — ABNORMAL LOW (ref 39.0–52.0)
Hemoglobin: 9.1 g/dL — ABNORMAL LOW (ref 13.0–17.0)
MCH: 27.6 pg (ref 26.0–34.0)
MCHC: 30.3 g/dL (ref 30.0–36.0)
MCV: 90.9 fL (ref 80.0–100.0)
NRBC: 0 % (ref 0.0–0.2)
Platelets: 450 10*3/uL — ABNORMAL HIGH (ref 150–400)
RBC: 3.3 MIL/uL — ABNORMAL LOW (ref 4.22–5.81)
RDW: 15.8 % — ABNORMAL HIGH (ref 11.5–15.5)
WBC: 13.4 10*3/uL — ABNORMAL HIGH (ref 4.0–10.5)

## 2018-07-12 LAB — BASIC METABOLIC PANEL
Anion gap: 9 (ref 5–15)
BUN: 12 mg/dL (ref 8–23)
CO2: 26 mmol/L (ref 22–32)
Calcium: 8.5 mg/dL — ABNORMAL LOW (ref 8.9–10.3)
Chloride: 97 mmol/L — ABNORMAL LOW (ref 98–111)
Creatinine, Ser: 0.53 mg/dL — ABNORMAL LOW (ref 0.61–1.24)
GFR calc Af Amer: >60 mL/min (ref >60)
Glucose, Bld: 104 mg/dL — ABNORMAL HIGH (ref 70–99)
Potassium: 4.1 mmol/L (ref 3.5–5.1)
Sodium: 132 mmol/L — ABNORMAL LOW (ref 135–145)

## 2018-07-12 MED ORDER — CEFAZOLIN SODIUM-DEXTROSE 2-4 GM/100ML-% IV SOLN
2.0000 g | Freq: Three times a day (TID) | INTRAVENOUS | Status: DC
  Administered 2018-07-12 – 2018-07-13 (×3): 2 g via INTRAVENOUS
  Filled 2018-07-12 (×4): qty 100

## 2018-07-12 NOTE — Plan of Care (Signed)
  Problem: Consults Goal: RH SPINAL CORD INJURY PATIENT EDUCATION Description  See Patient Education module for education specifics.  Outcome: Progressing Goal: Skin Care Protocol Initiated - if Braden Score 18 or less Description If consults are not indicated, leave blank or document N/A Outcome: Progressing   Problem: SCI BOWEL ELIMINATION Goal: RH STG MANAGE BOWEL WITH ASSISTANCE Description STG Manage Bowel with Mod.Assistance.  Outcome: Progressing Goal: RH STG SCI MANAGE BOWEL WITH MEDICATION WITH ASSISTANCE Description STG SCI Manage bowel with medication with Mod.assistance.  Outcome: Progressing Goal: RH STG MANAGE BOWEL W/EQUIPMENT W/ASSISTANCE Description STG Manage Bowel With Equipment With mod. Assistance  Outcome: Progressing Goal: RH STG SCI MANAGE BOWEL PROGRAM W/ASSIST OR AS APPROPRIATE Description STG SCI Manage bowel program w/mod.assist or as appropriate.  Outcome: Progressing Flowsheets (Taken 07/12/2018 1328) STG: SCI Pt will manage bowels with assistance or as appropriate: Maximum assist   Problem: SCI BLADDER ELIMINATION Goal: RH STG MANAGE BLADDER WITH ASSISTANCE Description STG Manage Bladder With mod.Assistance  Outcome: Progressing   Problem: RH SKIN INTEGRITY Goal: RH STG SKIN FREE OF INFECTION/BREAKDOWN Description With mod. Assist.  Outcome: Progressing Goal: RH STG MAINTAIN SKIN INTEGRITY WITH ASSISTANCE Description STG Maintain Skin Integrity With Mod.Assistance.  Outcome: Progressing Flowsheets (Taken 07/12/2018 1328) STG: Maintain skin integrity with assistance: 1-Total assistance Goal: RH STG ABLE TO PERFORM INCISION/WOUND CARE W/ASSISTANCE Description STG Able To Perform Incision/Wound Care With Mod.Assistance.  Outcome: Progressing Flowsheets (Taken 07/12/2018 1328) STG: Pt will be able to perform incision/wound care with assistance: 1-Total assistance   Problem: RH SAFETY Goal: RH STG ADHERE TO SAFETY PRECAUTIONS  W/ASSISTANCE/DEVICE Description STG Adhere to Safety Precautions With Mod.Assistance/Device.  Outcome: Progressing   Problem: RH PAIN MANAGEMENT Goal: RH STG PAIN MANAGED AT OR BELOW PT'S PAIN GOAL Description Less than 3,on 1 to 10 scale.  Outcome: Progressing

## 2018-07-12 NOTE — Progress Notes (Addendum)
Physical Therapy Session Note  Patient Details  Name: Brian [name] MRN: [id number] Date of Birth: 1945-06-27  Today's Date: 07/12/2018 PT Individual Time: 1030-1210 PT Individual Time Calculation (min): 100 min   Short Term Goals:  Week 2:  PT Short Term Goal 1 (Week 2): supine> sit with min assist PT Short Term Goal 2 (Week 2): slide board transfer with mod assist consistently PT Short Term Goal 3 (Week 2): pt will be able to describe spinal precautions regarding functional movements      Skilled Therapeutic Interventions/Progress Updates:   Pt supine in bed.  PT donned bil TEDS.  Pt rolled L><R using rails and supervision for PT to place abdominal binder in place. Strengthening exs in supine: active assistive PRN, 10 x 1 bil bridging/pelvic tilt, R/L hip abduction with extended knee; 10 x 2 bil adductor squeezes, bil hip internal rotation with extended knees.  Supine> sit with mod assist. PT donned TLSO with pt sitting EOB.  Mod assist slide board transfer, max cues for sequencing.  Family ed with dtr [name] for slide board transfer wc to mat to L.  Max cues for dtr and pt regarding head/hips relationship and positioning for safety.    From 25" high mat, sit> stand +2 with RW, pushing up with 1 hand, and other hand on RW .  Pt stood x 1 minute while composing PVC puzzle via photo, accurately. Pt rested in sitting and stood again x 1 minute to compose another puzzle.  While standing 2nd bout, pt was incontinent of bowel.  Pt remained standing and stepped around to sit in wc, using RW with min assist.  Pt stated he needed to urinate; continent voiding after set-up, sitting in w/c.  Slide board transfer to return to bed, +2 as pt was very fattgued and embarassed.  Sit> supine max assist for bil LEs and trunk control.  Pt rolled with tactile cues so that abdominal binder and clothing could be removed.  Pt left in the care of [name], [place].      Therapy Documentation Precautions:   Precautions Precautions: Back Precaution Comments: re-educated on BLT, pt cont to be reminded during function Required Braces or Orthoses: Spinal Brace Spinal Brace: Thoracolumbosacral orthotic, Applied in sitting position Restrictions Weight Bearing Restrictions: No  Pt on 2L O2 via [place]   Pain: Pain Assessment Pain Scale: 0-10 Pain Score: 5  Pain Type: Surgical pain Pain Location: Back Pain Orientation: Lower Pain Intervention(s): Medication (See eMAR);Repositioned      Therapy/Group: Individual Therapy  Giovany Cosby 07/12/2018, 12:26 PM

## 2018-07-12 NOTE — Progress Notes (Signed)
[place] PHYSICAL MEDICINE & REHABILITATION PROGRESS NOTE  Subjective/Complaints: Patient seen lying in bed this morning.  Daughter at bedside.  Patient states he slept fairly overnight and had a fair weekend.  Daughter with questions regarding wound VAC, pain, hip/shoulder injections.  ROS: + Generalized musculoskeletal pain.  Denies CP, shortness of breath, nausea, vomiting, diarrhea.   Objective: Vital Signs: Blood pressure 109/69, pulse 84, temperature 99.1 F (37.3 C), temperature source Oral, resp. rate 19, height 5\' 10"  (1.778 m), weight 94.4 kg, SpO2 96 %. No results found. Recent Labs    07/12/18 0624  WBC 13.4*  HGB 9.1*  HCT 30.0*  PLT 450*   Recent Labs    07/12/18 0624  NA 132*  K 4.1  CL 97*  CO2 26  GLUCOSE 104*  BUN 12  CREATININE 0.53*  CALCIUM 8.5*    Physical Exam: BP 109/69 (BP Location: Right Arm)   Pulse 84   Temp 99.1 F (37.3 C) (Oral)   Resp 19   Ht 5\' 10"  (1.778 m)   Wt 94.4 kg   SpO2 96%   BMI 29.86 kg/m  Constitutional: No distress . Vital signs reviewed. HENT: Normocephalic.  Atraumatic. Eyes: EOMI. No discharge. Cardiovascular: RRR. No JVD. Respiratory: CTA Bilaterally. Normal effort. GI: BS +. Non-distended. Musculoskeletal: Bilateral lower extremity edema, left greater than right, stable Neurological: He is alert and oriented  Dysphonia Able to follow basic commands without difficulty.  Motor:  Right lower extremity: 3-/5 proximal distal, pain inhibition Left lower extremity: Hip flexion 3-/5, knee extension 4-/5, ankle dorsiflexion 4/5, pain inhibition Skin: Generalized rash, improving Back incision with vacuum in place Psychiatric: Flat, easily agitated  Assessment/Plan: 1. Functional deficits secondary to lung cancer with mets to ribs, vertebrae with oncological fracture status post lumbar fusion which require 3+ hours per day of interdisciplinary therapy in a comprehensive inpatient rehab setting.  Physiatrist  is providing close team supervision and 24 hour management of active medical problems listed below.  Physiatrist and rehab team continue to assess barriers to discharge/monitor patient progress toward functional and medical goals  Care Tool:  Bathing  Bathing activity did not occur: Safety/medical concerns(Unable to assess 2/2 drop in BP with upright positioning) Body parts bathed by patient: Right arm, Left arm, Chest, Abdomen, Front perineal area, Right upper leg, Left upper leg, Face   Body parts bathed by helper: Buttocks, Right lower leg, Left lower leg     Bathing assist Assist Level: Maximal Assistance - Patient 24 - 49%     Upper Body Dressing/Undressing Upper body dressing Upper body dressing/undressing activity did not occur (including orthotics): Safety/medical concerns(B/P issues ) What is the patient wearing?: Pull over shirt, Orthosis Orthosis activity level: Performed by helper  Upper body assist Assist Level: Minimal Assistance - Patient > 75%    Lower Body Dressing/Undressing Lower body dressing      What is the patient wearing?: Pants     Lower body assist Assist for lower body dressing: Minimal Assistance - Patient > 75%     Toileting Toileting Toileting Activity did not occur (Clothing management and hygiene only): Safety/medical concerns(Unable to assess 2/2 drop in BP with upright positioning)  Toileting assist Assist for toileting: 2 Helpers     Transfers Chair/bed transfer  Transfers assist     Chair/bed transfer assist level: Minimal Assistance - Patient > 75%     Locomotion Ambulation   Ambulation assist   Ambulation activity did not occur: Safety/medical concerns  Assist level: Minimal  Assistance - Patient > 75%(w/c follow for safety) Assistive device: Walker-rolling Max distance: 18   Walk 10 feet activity   Assist     Assist level: Minimal Assistance - Patient > 75% Assistive device: Walker-rolling   Walk 50 feet  activity   Assist           Walk 150 feet activity   Assist           Walk 10 feet on uneven surface  activity   Assist           Wheelchair     Assist Will patient use wheelchair at discharge?: Yes Type of Wheelchair: Manual Wheelchair activity did not occur: Safety/medical concerns(hypotensive)  Wheelchair assist level: Supervision/Verbal cueing Max wheelchair distance: 75    Wheelchair 50 feet with 2 turns activity    Assist    Wheelchair 50 feet with 2 turns activity did not occur: Safety/medical concerns   Assist Level: Supervision/Verbal cueing   Wheelchair 150 feet activity     Assist Wheelchair 150 feet activity did not occur: Safety/medical concerns   Assist Level: Supervision/Verbal cueing      Medical Problem List and Plan: 1.Functional and mobility deficitssecondary to metastatic lung cancer to left clavicle, ribs, L4 vertebral body with fracture. Pt is s/p L4 corpectomy and L3-5 fusion by Dr. Rolena Infante on 06/11/18  Continue CIR  Per hematology/oncology, will await improvement in function and strength prior to chemotherapy.   rad-onc involved.  Poor prognosis.  Status post I&D of back wound 1/17-notes reviewed, no mention of hip/shoulder injection which daughter believes was done operatively 2. DVT: dopplers done this evening and patient has DVT in left CFV             IVC filter placement on 1/8  Pt not a candidate for anticoagulation due to hematoma and bleeding riskat present although long term he is high VTE risk 3. Pain Management: dilaudid prn. Unable to tolerate Oxycodone   Fentanyl increased to 25 mcg on 1/17  Appreciate palliative care consult- notes reviewed- Robaxin frequency increased, Norco dose increased   Cymbalta added on 1/17 4. Mood:LCSW to follow for evaluation and support.  Cymbalta added 5. Neuropsych: This patientiscapable of making decisions on hisown behalf. 6. Skin/Wound Care:vac in place on  back wound per ortho. 7. Fluids/Electrolytes/Nutrition:Monitor I/O.  8.Blood pressure; Intermittent elevation noted   Orthostatic vital signs ordered on 1/10, remains pending on 1/20  Abdominal binder as needed  Monitor with increased mobility 9. ABLA: Continue to monitor with serial checks.   Hemoglobin 9.1 on 1/20  Iron studies consistent with anemia of chronic disease: Ferritin levels very high 10. Hypokalemia:   Potassium 4.1 on 1/20   Supplement increased on 1/13  Magnesium 1.6 on 1/14   Supplement initiated on 1/14 11. Rash: Continue Clobetasol bidas well as hypoallergenic sheets. 12. Neuropathic pain: Improving on gabapentin 300 mg tid 13. Constipation:Managed with miralax prn--patient wants to manage his bowel program. 14.  Hypoalbuminemia  Supplement initiated on 1/8 15.  Sinus tachycardia  Metoprolol 12.5 twice daily started on 1/13, changed to Coreg on 1/15  Improved on 1/20 16.  Hyponatremia  Sodium 135 on 1/17  Continue to monitor 17.  Leukocytosis  WBCs 13.4 on 1/20  Wound cultures with E. coli, pansensitive   Chest x-ray reviewed with patient, unremarkable for acute infectious process, repeat chest x-ray on 1/16 reviewed showing some lower abnormality-continue to monitor  Afebrile  Currently on IV Ancef and IV Cipro, will follow-up by  DC  Antibiotic beads placed by Ortho in wound  Blood cultures NGTD  Back incision as above 18. Peripheral edema  Lasix 20 daily started on 1/15, local care and elevation  19.  Sleep disturbance  Trazodone DC'd, Benadryl ordered for pruritus and sleep on 1/17  Improving 20.  Pruritus.  Secondary to pain medications  Benadryl as needed, dose increased on 1/17  Will consider steroids if necessary 21.  Multiple joint pain  Multifactorial  Hip x-ray with degenerative changes  LOS: 13 days A FACE TO FACE EVALUATION WAS PERFORMED  Ankit [name] 07/12/2018, 9:25 AM

## 2018-07-12 NOTE — Progress Notes (Signed)
    Subjective: Procedure(s) (LRB): LUMBAR WOUND DEBRIDEMENT (N/A) 3 Days Post-Op  Patient reports pain as 5 on 0-10 scale.  Reports decreased leg pain reports incisional back pain   Positive void Negative bowel movement Positive flatus Negative chest pain or shortness of breath  Objective: Vital signs in last 24 hours: Temp:  [97.9 F (36.6 C)-99.1 F (37.3 C)] 99.1 F (37.3 C) (01/20 0423) Pulse Rate:  [84-92] 84 (01/20 0423) Resp:  [18-19] 19 (01/20 0423) BP: (97-113)/(54-74) 109/69 (01/20 0423) SpO2:  [95 %-96 %] 96 % (01/20 0423)  Intake/Output from previous day: 01/19 0701 - 01/20 0700 In: 120 [P.O.:120] Out: 1550 [Urine:1550]  Labs: Recent Labs    07/12/18 0624  WBC 13.4*  RBC 3.30*  HCT 30.0*  PLT 450*   Recent Labs    07/12/18 0624  NA 132*  K 4.1  CL 97*  CO2 26  BUN 12  CREATININE 0.53*  GLUCOSE 104*  CALCIUM 8.5*   No results for input(s): LABPT, INR in the last 72 hours.  Physical Exam: ABD soft Sensation intact distally Dorsiflexion/Plantar flexion intact Incision: vac functioning well Compartment soft Body mass index is 29.86 kg/m.   Assessment/Plan: Patient stable  xrays n/a Continue mobilization with physical therapy Continue care  1. Recommend ID consult to discuss abx regimen and duration.  Added cipro given sensitivity 2. Will order PICC line as he will need IV Abx treatment 3. Will discuss need for Iron supplementation with Onc and rehab team 4. Continue wound vac until WBC drops, patient remains AF, and wound appears healthy 5. Will discuss with rehab team greater trochanter bursa injection and should injection for pain relief.    Melina Schools, MD [facility] [phone]

## 2018-07-12 NOTE — Progress Notes (Signed)
Occupational Therapy Session Note  Patient Details  Name: Brian [name] MRN: [id number] Date of Birth: [birth year]  Today's Date: 07/12/2018 OT Individual Time: 1300-1330 and 1430-1500 OT Individual Time Calculation (min): 30 min and 30 min   Short Term Goals: Week 2:  OT Short Term Goal 1 (Week 2): Pt will consistently complete basic sliding board transfers with min A OT Short Term Goal 2 (Week 2): Pt will complete consecutive toilet transfers as  well as toileting task with +1 assist only in order to reduce caregiver burden OT Short Term Goal 3 (Week 2): Pt will stand to complete one grooming task in order to increase functional activity tolerance OT Short Term Goal 4 (Week 2): Pt will consistently complete LB dressing task with +1 assist without use of lift equipment in prep for d/c   Skilled Therapeutic Interventions/Progress Updates:   Therapist arrived at 8:45 scheduled  Session, Pt having just had wound vac change, voiced pain 10/10 at site of back. Requested therapist to return at later time as not able to tolerate pain levels with mobility at this time.     Session One: Pt seen for OT session focusing on functional transfers, upright tolerance and ADL re-training. Pt in supine upon arrival, pain 6/10 in lower back, up to date on pain meds and re-positioned throughout session for comfort. He transferred to EOB with max A overall, increased time and assist to bring B LEs off EOB and assist to bring trunk upright. TLSO donned total A while pt seated EOB.  He completed downhill transfer into w/c with min A overall, considerably increased time required with mod cuing for head/hip relationship and hand placement during transfer.  He completed grooming tasks from w/c level at sink with set-up assist. Pt left seated in w/c set-up with meal tray and wife and RN present. Plan to return in 1hour to assist with back to bed transfer. Aiming to increase pt's OOB tolerance.  Session Two:  Therapist returned one hour later, pt ready to get back to bed. Pt voiced pain 7/10, repositioned back in supine for comfort. Pt's O2 at 73% on RA, supplied 2L supplemental O2 with O2 level raising >90% when cued for deep breathing techniques.  He completed even level sliding board transfer w/c>EOB, min A overall with considerably increased time and rest breaks required. Seated EOB, doffed TLSO total A. Vitals assist, 113/60 with TED hose donned, O2 levels dropping into 70%s with activity, increased supplimental O2 to 3L. Pt returned to supine, doffed TED hose for skin protection. LEs elevated with pillows and pt left with all needs in reach and family members present.   Therapy Documentation Precautions:  Precautions Precautions: Back Precaution Comments: re-educated on BLT, pt cont to be reminded during function Required Braces or Orthoses: Spinal Brace Spinal Brace: Thoracolumbosacral orthotic, Applied in sitting position Restrictions Weight Bearing Restrictions: No   Therapy/Group: Individual Therapy  Sayward Horvath L 07/12/2018, 7:11 AM

## 2018-07-12 NOTE — Progress Notes (Addendum)
Daily Progress Note   Patient Name: Brian [name]       Date: 07/12/2018 DOB: 1945-11-15  Age: 73 y.o. MRN#: [id number] Attending Physician: Jamse Arn, MD Primary Care Physician: [name], MD Admit Date: 06/29/2018  Reason for Consultation/Follow-up: psychosocial support.   Subjective: Patient is resting in bed with wife Brian [name] at bedside. He states "fine" when asked how he is doing and does not elaborate. She states her mother in [place] fell and was just moved back to a nursing home. She was told her mother had no serious injuries, but is not doing well overall. She is worried about her husband. His VAC continues to have leaks. She states she has a tremendous amount of stress. Patient's daughter Brian [name] arrived during conversation.   We stepped out. She voices concerns about the future, and knowing what to strive for. She states if anything negative is discussed, he latches on to this and will stop trying, but she understands the importance of reality as well. She discusses his setbacks, and that when one thing happens and is fixed, she just braces herself for the next thing to happen. Discussed his diagnoses and prognoses, and symptoms. She tells me in therapy he tries to cheat, but believes it may just be humor.   She states he has been rubbing the clavicle with cancer, and complaining of pain. Would recommend considering increasing Fentanyl patch again if it is cancer related pain.   She states she is trying to motivate him to eat more. She states she would like to speak with the dietitian. Would recommend appetite stimulant to help his nutritional status.    Length of Stay: 13  Current Medications: Scheduled Meds:  . carvedilol  6.25 mg Oral BID WC  . clobetasol  ointment   Topical BID  . DULoxetine  30 mg Oral Daily  . feeding supplement (PRO-STAT SUGAR FREE 64)  30 mL Oral BID  . fentaNYL  1 patch Transdermal Q72H  . furosemide  20 mg Oral Daily  . gabapentin  300 mg Oral TID  . guaiFENesin  10 mL Oral Q6H  . HYDROcodone-acetaminophen  1 tablet Oral QHS  . magnesium oxide  200 mg Oral Daily  . methocarbamol  750 mg Oral QID  . pantoprazole  40 mg Oral Daily  .  polyethylene glycol  17 g Oral Once  . potassium chloride  30 mEq Oral BID  . senna  2 tablet Oral QHS    Continuous Infusions: .  ceFAZolin (ANCEF) IV 1 g (07/12/18 1016)  . ciprofloxacin 200 mg (07/11/18 2319)    PRN Meds: acetaminophen, albuterol, alum & mag hydroxide-simeth, bisacodyl, camphor-menthol, diphenhydrAMINE, diphenhydrAMINE, guaiFENesin-dextromethorphan, HYDROcodone-acetaminophen, HYDROmorphone HCl, menthol-cetylpyridinium **OR** phenol, polyethylene glycol, prochlorperazine **OR** prochlorperazine **OR** prochlorperazine, sodium phosphate  Physical Exam Pulmonary:     Effort: Pulmonary effort is normal.  Skin:    General: Skin is warm and dry.             Vital Signs: BP 109/69 (BP Location: Right Arm)   Pulse 84   Temp 99.1 F (37.3 C) (Oral)   Resp 19   Ht 5\' 10"  (1.778 m)   Wt 94.4 kg   SpO2 96%   BMI 29.86 kg/m  SpO2: SpO2: 96 % O2 Device: O2 Device: Nasal Cannula O2 Flow Rate: O2 Flow Rate (L/min): 2 L/min  Intake/output summary:   Intake/Output Summary (Last 24 hours) at 07/12/2018 1056 Last data filed at 07/12/2018 0900 Gross per 24 hour  Intake 120 ml  Output 1775 ml  Net -1655 ml   LBM: Last BM Date: 07/08/18 Baseline Weight: Weight: 96.8 kg Most recent weight: Weight: 94.4 kg       Palliative Assessment/Data: 40%      Patient Active Problem List   Diagnosis Date Noted  . Right hip pain   . Sleep disturbance   . Peripheral edema   . Leukocytosis   . Hyponatremia   . Sinus tachycardia   . Hypomagnesemia   . Orthostatic  hypotension   . Labile blood pressure   . Cancer associated pain   . DVT of lower extremity (deep venous thrombosis) ([place])   . Malignant neoplasm of lung ([place])   . Hypoalbuminemia due to protein-calorie malnutrition ([place])   . Acute blood loss anemia   . Postoperative pain   . Pathological compression fracture of lumbar vertebra ([facility]) 06/29/2018  . Metastatic lung cancer (metastasis from lung to other site) ([place]) 06/28/2018  . Rash 06/25/2018  . Hypokalemia 06/25/2018  . Normocytic anemia 06/25/2018  . S/P lumbar fusion 06/24/2018  . Palliative care by specialist   . Cancer related pain   . Lung cancer, primary, with metastasis from lung to other site, right ([place]) 06/14/2018  . Squamous cell lung cancer, right ([place]) 06/14/2018  . Lung cancer metastatic to bone ([place]) 06/14/2018  . Bronchitis due to tobacco use 06/07/2018  . Lumbar spine tumor 06/04/2018    Palliative Care Assessment & Plan   Recommendations/Plan:  Left clavicle with cancer pain. Would recommend increasing Fentanyl patch for cancer related pain. Would recommend an appetite stimulant.  Brian [name] would like education by the dietitian.     Code Status:    Code Status Orders  (From admission, onward)         Start     Ordered   06/29/18 1526  Full code  Continuous     06/29/18 1525        Code Status History    Date Active Date Inactive Code Status Order ID Comments User Context   06/11/2018 2031 06/29/2018 1440 Full Code [id number]  Milagros Loll, MD Inpatient   06/11/2018 2011 06/11/2018 2030 Full Code [id number]  Melina Schools, MD Inpatient   06/04/2018 1344 06/11/2018 2011 Full Code [id number]  Ardeen Jourdain, PA-C Inpatient  Prognosis:  Poor overall. Metastatic cancer with pathologic fracture diagnosed in December.   Discharge Planning:  To Be Determined  Care plan was discussed with RN  Thank you for allowing the Palliative Medicine Team to assist in the care of this  patient.   Time In: 9:45 Time Out: 11:00 Total Time 75 min Prolonged Time Billed yes      Greater than 50%  of this time was spent counseling and coordinating care related to the above assessment and plan.  Asencion Gowda, NP  Please contact Palliative Medicine Team phone at [phone] for questions and concerns.

## 2018-07-12 NOTE — Consult Note (Signed)
[name] for Infectious Disease    Date of Admission:  06/29/2018     Total days of antibiotics 5   Day 5 cefazolin  Day 3 cipro               Reason for Consult: Infection of lumbar surgery incision     Referring Provider: Rolena Infante Primary Care Provider: [name], MD   Assessment: Brian [name] is a 73 y.o. male with history of squamous cell lung cancer with metastasis now with e coli and klebsiella surgical wound infection. He is currently afebrile although reports ongoing chills and subjective fevers. He has had some diarrhea (of which he has been using bed pan) as well as multiple surgeries complicated by bleeding/hemtoma which puts him at higher risk for post op infection. Wound infection with purulent material below deep fascia. Will place picc line and continue cefazolin alone but increase to 2 gm Q8h. Stop ciprofloxacin to reduce risk for cdiff and no need for double coverage now that all sensitivity info is back.   We discussed PICC line - will defer to IV team / primary team whether a midline would be more appropriate with his history of recent DVT but planning on ~3 weeks IV therapy pending response. Will follow to see when his return trip to the OR will be to reassess findings. His family is ready to accommodate 3x a day infusions if needed at home but sounds like he will be primary in rehab for a while longer throughout his treatment.   Plan: 1. Increase cefazolin to 2 gm IV q8h 2. Stop Ciprofloxacin  3. ESR/CRP in AM and follow for tx response   4. Follow WBC count  5. Vascular access to be placed - PICC vs midline catheter per primary team or IV team   Active Problems:   Pathological compression fracture of lumbar vertebra ([place])   DVT of lower extremity (deep venous thrombosis) ([facility])   Malignant neoplasm of lung ([facility])   Hypoalbuminemia due to protein-calorie malnutrition ([facility])   Acute blood loss anemia   Postoperative pain   Labile blood  pressure   Cancer associated pain   Orthostatic hypotension   Leukocytosis   Hyponatremia   Sinus tachycardia   Hypomagnesemia   Sleep disturbance   Peripheral edema   Right hip pain   . carvedilol  6.25 mg Oral BID WC  . clobetasol ointment   Topical BID  . DULoxetine  30 mg Oral Daily  . feeding supplement (PRO-STAT SUGAR FREE 64)  30 mL Oral BID  . fentaNYL  1 patch Transdermal Q72H  . furosemide  20 mg Oral Daily  . gabapentin  300 mg Oral TID  . guaiFENesin  10 mL Oral Q6H  . HYDROcodone-acetaminophen  1 tablet Oral QHS  . magnesium oxide  200 mg Oral Daily  . methocarbamol  750 mg Oral QID  . pantoprazole  40 mg Oral Daily  . polyethylene glycol  17 g Oral Once  . potassium chloride  30 mEq Oral BID  . senna  2 tablet Oral QHS    HPI: Brian [name] is a 73 y.o. male with a pmhx significant for RA, emphysema, tobacco use, RLL squamous cell carcinoma with metastasis to bone of left clavicle, R #8 rib, L4 vertebral body with fracture and 2.5 cm liver lesion.  06/11/18 > L4 corpectomy, L3-5 anterior reconstruction. Perioperatively had bleeding c/b hematoma at corpectomy site with  retroperitoneal hematoma extending to psoas muscle.  06/13/18 > posterior spinal fusion L3-5 to continue with original reconstruction surgery for screw fixation.  06/24/18 > decompressive lumbar laminectomy L4-5 07/09/18 > debridement of lumbar wound due to drainage from surgical incision.   Complicating his admission further he was found to have acute deep vein thrombosis of the left common femoral artery now s/p IVC filter.   Operative note from latest surgery reviewed - wound edges were debrided with frank purulent material expressed. Sutures were removed from the deep fascia and opened the deep fascia with small amount of purulent material expressed which was also cultured. This was debrided. Vancomycin beads were placed into the wound bed. There is plan to return to OR later this week at some  point per his understanding of the care plan to remove these.   Jay[place] is frustrated about surgeries and how many he has needed and "does not even have enough information today to generate a question." Micro data revealed e coli (pan sensitive) as well as klebsiella (r- amp). He is currently on ciprofloxacin and cefazolin IV and has tolerated both of these well w/o side effect. He has been having ongoing subjective fevers/chills. Has been working with rehab team and feels that he has stalled on progress.   Review of Systems: Review of Systems  Constitutional: Positive for chills. Negative for fever (subjective).  HENT: Negative for tinnitus.   Eyes: Negative for blurred vision and photophobia.  Respiratory: Negative for cough and sputum production.   Cardiovascular: Negative for chest pain.  Gastrointestinal: Negative for diarrhea, nausea and vomiting.  Genitourinary: Negative for dysuria.  Musculoskeletal: Positive for back pain.  Skin: Negative for rash.  Neurological: Positive for weakness. Negative for headaches.    Past Medical History:  Diagnosis Date  . Arthritis     Social History   Tobacco Use  . Smoking status: Current Every Day Smoker    Packs/day: 0.25    Types: Cigarettes  . Smokeless tobacco: Never Used  Substance Use Topics  . Alcohol use: Not on file  . Drug use: Never    Family History  Problem Relation Age of Onset  . Diabetes Mother   . Heart attack Father   . Obesity Father   . Diabetes Sister    Allergies  Allergen Reactions  . Oxycodone     Severe N/V  . Minoxidil Palpitations  . Nicotine Palpitations  . Sulfamethoxazole Other (See Comments)    Both parents allergic, avoids med    OBJECTIVE: Blood pressure 111/63, pulse 97, temperature 98.1 F (36.7 C), resp. rate 16, height '5\' 10"'$  (1.778 m), weight 94.4 kg, SpO2 95 %.  Physical Exam Constitutional:      Comments: Resting in bed; just finished PT. Tired.   HENT:     Mouth/Throat:      Mouth: Mucous membranes are dry. No oral lesions.     Dentition: Normal dentition. No dental caries.     Pharynx: No oropharyngeal exudate.  Eyes:     General: No scleral icterus.    Pupils: Pupils are equal, round, and reactive to light.  Cardiovascular:     Rate and Rhythm: Normal rate and regular rhythm.     Pulses: Normal pulses.     Heart sounds: Normal heart sounds. No murmur.  Pulmonary:     Effort: Pulmonary effort is normal.     Breath sounds: Normal breath sounds.  Abdominal:     General: There is no distension.  Palpations: Abdomen is soft.     Tenderness: There is no abdominal tenderness.  Musculoskeletal:     Comments: Wound vac in place with serosanguinous drainage. Dressing intact but not fully visualized.   Lymphadenopathy:     Cervical: No cervical adenopathy.  Skin:    General: Skin is warm and dry.     Capillary Refill: Capillary refill takes less than 2 seconds.     Findings: No rash.  Neurological:     Mental Status: He is alert and oriented to person, place, and time.     Lab Results Lab Results  Component Value Date   WBC 13.4 (H) 07/12/2018   HGB 9.1 (L) 07/12/2018   HCT 30.0 (L) 07/12/2018   MCV 90.9 07/12/2018   PLT 450 (H) 07/12/2018    Lab Results  Component Value Date   CREATININE 0.53 (L) 07/12/2018   BUN 12 07/12/2018   NA 132 (L) 07/12/2018   K 4.1 07/12/2018   CL 97 (L) 07/12/2018   CO2 26 07/12/2018    Lab Results  Component Value Date   ALT 25 07/09/2018   AST 27 07/09/2018   ALKPHOS 115 07/09/2018   BILITOT 0.5 07/09/2018     Microbiology: Recent Results (from the past 240 hour(s))  Aerobic Culture (superficial specimen)     Status: None   Collection Time: 07/08/18  1:38 PM  Result Value Ref Range Status   Specimen Description WOUND BACK  Final   Special Requests NONE  Final   Gram Stain   Final    RARE WBC PRESENT,BOTH PMN AND MONONUCLEAR RARE GRAM VARIABLE ROD Performed at [facility], [place] [address] [place],  [address]    Culture   Final    MODERATE KLEBSIELLA PNEUMONIAE MODERATE ESCHERICHIA COLI    Report Status 07/11/2018 FINAL  Final   Organism ID, Bacteria KLEBSIELLA PNEUMONIAE  Final   Organism ID, Bacteria ESCHERICHIA COLI  Final      Susceptibility   Escherichia coli - MIC*    AMPICILLIN <=2 SENSITIVE Sensitive     CEFAZOLIN <=4 SENSITIVE Sensitive     CEFEPIME <=1 SENSITIVE Sensitive     CEFTAZIDIME <=1 SENSITIVE Sensitive     CEFTRIAXONE <=1 SENSITIVE Sensitive     CIPROFLOXACIN <=0.25 SENSITIVE Sensitive     GENTAMICIN <=1 SENSITIVE Sensitive     IMIPENEM <=0.25 SENSITIVE Sensitive     TRIMETH/SULFA <=20 SENSITIVE Sensitive     AMPICILLIN/SULBACTAM <=2 SENSITIVE Sensitive     PIP/TAZO <=4 SENSITIVE Sensitive     Extended ESBL NEGATIVE Sensitive     * MODERATE ESCHERICHIA COLI   Klebsiella pneumoniae - MIC*    AMPICILLIN >=32 RESISTANT Resistant     CEFAZOLIN <=4 SENSITIVE Sensitive     CEFEPIME <=1 SENSITIVE Sensitive     CEFTAZIDIME <=1 SENSITIVE Sensitive     CEFTRIAXONE <=1 SENSITIVE Sensitive     CIPROFLOXACIN <=0.25 SENSITIVE Sensitive     GENTAMICIN <=1 SENSITIVE Sensitive     IMIPENEM <=0.25 SENSITIVE Sensitive     TRIMETH/SULFA <=20 SENSITIVE Sensitive     AMPICILLIN/SULBACTAM 4 SENSITIVE Sensitive     PIP/TAZO <=4 SENSITIVE Sensitive     Extended ESBL NEGATIVE Sensitive     * MODERATE KLEBSIELLA PNEUMONIAE  Culture, blood (routine x 2)     Status: None (Preliminary result)   Collection Time: 07/08/18  2:59 PM  Result Value Ref Range Status   Specimen Description BLOOD LEFT ANTECUBITAL  Final   Special Requests  Final    BOTTLES DRAWN AEROBIC ONLY Blood Culture adequate volume   Culture   Final    NO GROWTH 4 DAYS Performed at [facility], [place] [address] [place], [place] [address]    Report Status PENDING  Incomplete  Culture, blood (routine x 2)     Status: None (Preliminary result)   Collection Time: 07/08/18  3:07 PM  Result  Value Ref Range Status   Specimen Description BLOOD RIGHT ANTECUBITAL  Final   Special Requests   Final    BOTTLES DRAWN AEROBIC ONLY Blood Culture adequate volume   Culture   Final    NO GROWTH 4 DAYS Performed at [facility], [place] [address], [place], [place] [address]    Report Status PENDING  Incomplete  Aerobic/Anaerobic Culture (surgical/deep wound)     Status: None (Preliminary result)   Collection Time: 07/09/18  7:37 PM  Result Value Ref Range Status   Specimen Description WOUND  Final   Special Requests SUPERFICIAL SAMPLE A  Final   Gram Stain   Final    ABUNDANT WBC PRESENT,BOTH PMN AND MONONUCLEAR FEW GRAM VARIABLE ROD Performed at [facility], [place] [address] [place], [place] [address]    Culture   Final    FEW ESCHERICHIA COLI NO ANAEROBES ISOLATED; CULTURE IN PROGRESS FOR 5 DAYS    Report Status PENDING  Incomplete   Organism ID, Bacteria ESCHERICHIA COLI  Final      Susceptibility   Escherichia coli - MIC*    AMPICILLIN <=2 SENSITIVE Sensitive     CEFAZOLIN <=4 SENSITIVE Sensitive     CEFEPIME <=1 SENSITIVE Sensitive     CEFTAZIDIME <=1 SENSITIVE Sensitive     CEFTRIAXONE <=1 SENSITIVE Sensitive     CIPROFLOXACIN <=0.25 SENSITIVE Sensitive     GENTAMICIN <=1 SENSITIVE Sensitive     IMIPENEM <=0.25 SENSITIVE Sensitive     TRIMETH/SULFA <=20 SENSITIVE Sensitive     AMPICILLIN/SULBACTAM <=2 SENSITIVE Sensitive     PIP/TAZO <=4 SENSITIVE Sensitive     Extended ESBL NEGATIVE Sensitive     * FEW ESCHERICHIA COLI  Aerobic/Anaerobic Culture (surgical/deep wound)     Status: None (Preliminary result)   Collection Time: 07/09/18  7:37 PM  Result Value Ref Range Status   Specimen Description WOUND  Final   Special Requests DEEP SAMPLE B  Final   Gram Stain   Final    ABUNDANT WBC PRESENT,BOTH PMN AND MONONUCLEAR FEW GRAM VARIABLE ROD RARE GRAM POSITIVE COCCI Performed at [facility], [address], [place], [place] [address]    Culture    Final    MODERATE ESCHERICHIA COLI NO ANAEROBES ISOLATED; CULTURE IN PROGRESS FOR 5 DAYS    Report Status PENDING  Incomplete   Organism ID, Bacteria ESCHERICHIA COLI  Final      Susceptibility   Escherichia coli - MIC*    AMPICILLIN <=2 SENSITIVE Sensitive     CEFAZOLIN <=4 SENSITIVE Sensitive     CEFEPIME <=1 SENSITIVE Sensitive     CEFTAZIDIME <=1 SENSITIVE Sensitive     CEFTRIAXONE <=1 SENSITIVE Sensitive     CIPROFLOXACIN <=0.25 SENSITIVE Sensitive     GENTAMICIN <=1 SENSITIVE Sensitive     IMIPENEM <=0.25 SENSITIVE Sensitive     TRIMETH/SULFA <=20 SENSITIVE Sensitive     AMPICILLIN/SULBACTAM <=2 SENSITIVE Sensitive     PIP/TAZO <=4 SENSITIVE Sensitive     Extended ESBL NEGATIVE Sensitive     * MODERATE ESCHERICHIA  [place], MSN, NP-C [facility] for Infectious [place] Cell: [phone] Pager: [phone]  07/12/2018 4:09 PM

## 2018-07-12 NOTE — Consult Note (Addendum)
[facility] Nurse wound consult note Pt had surgery on 1/17, called Dr Rolena Infante of the ortho service to discuss plan of care and Vac dressing.  Reason for Consult: Requested to perform Vac dressing change to full thickness post-op wound on middle back Measurement: 7X2X1cm with .5 cm undermining to wound edges Wound bed: 80% red, 5% visible bone, 15% yellow, antibiotic beads in the wound bed Drainage (amount, consistency, odor) mod amt pink drainage in the cannister, no odor Periwound: intact skin surrounding, sutures to upper wound with well-approximated closed incision Dressing procedure/placement/frequency: Pt medicated for pain prior to procedure.  Family member at the bedside to assess wound appearance during dressing change. Applied Mepitel contact layer to incision and one piece black foam to open wound, then bridged track pad to the flank to reduce pressure over the back. Cont suction on at 1103mm. Pt tolerated with mod amt discomfort.  [facility] team will plan to change dressing Q M/W/F from 0800-0830 so therapy will not be disrupted. Julien Girt MSN, RN, [place], [place], [place]

## 2018-07-13 ENCOUNTER — Inpatient Hospital Stay: Payer: Self-pay

## 2018-07-13 ENCOUNTER — Inpatient Hospital Stay (HOSPITAL_COMMUNITY): Payer: Medicare Other

## 2018-07-13 ENCOUNTER — Inpatient Hospital Stay (HOSPITAL_COMMUNITY): Payer: Medicare Other | Admitting: Occupational Therapy

## 2018-07-13 DIAGNOSIS — R52 Pain, unspecified: Secondary | ICD-10-CM

## 2018-07-13 LAB — CULTURE, BLOOD (ROUTINE X 2)
CULTURE: NO GROWTH
Culture: NO GROWTH
Special Requests: ADEQUATE
Special Requests: ADEQUATE

## 2018-07-13 MED ORDER — GABAPENTIN 300 MG PO CAPS
300.0000 mg | ORAL_CAPSULE | Freq: Three times a day (TID) | ORAL | Status: DC
  Administered 2018-07-13 – 2018-07-30 (×42): 300 mg via ORAL
  Filled 2018-07-13 (×50): qty 1

## 2018-07-13 MED ORDER — METHOCARBAMOL 750 MG PO TABS: 750.0000 mg | ORAL_TABLET | Freq: Four times a day (QID) | ORAL | Status: DC

## 2018-07-13 MED ORDER — POTASSIUM CHLORIDE CRYS ER 20 MEQ PO TBCR: 30.0000 meq | EXTENDED_RELEASE_TABLET | Freq: Two times a day (BID) | ORAL | Status: DC

## 2018-07-13 MED ORDER — METHOCARBAMOL 750 MG PO TABS
750.0000 mg | ORAL_TABLET | Freq: Four times a day (QID) | ORAL | Status: DC
  Administered 2018-07-13 – 2018-07-30 (×55): 750 mg via ORAL
  Filled 2018-07-13 (×62): qty 1

## 2018-07-13 MED ORDER — DULOXETINE HCL 30 MG PO CPEP
30.0000 mg | ORAL_CAPSULE | Freq: Every day | ORAL | Status: DC
  Administered 2018-07-14 – 2018-07-15 (×2): 30 mg via ORAL
  Filled 2018-07-13 (×2): qty 1

## 2018-07-13 MED ORDER — SENNA 8.6 MG PO TABS
2.0000 | ORAL_TABLET | Freq: Every day | ORAL | Status: DC
  Administered 2018-07-13 – 2018-07-14 (×2): 17.2 mg via ORAL
  Filled 2018-07-13 (×2): qty 2

## 2018-07-13 MED ORDER — CARVEDILOL 6.25 MG PO TABS
6.2500 mg | ORAL_TABLET | Freq: Two times a day (BID) | ORAL | Status: DC
  Administered 2018-07-13 – 2018-07-30 (×29): 6.25 mg via ORAL
  Filled 2018-07-13 (×33): qty 1

## 2018-07-13 MED ORDER — FUROSEMIDE 20 MG PO TABS: 20.0000 mg | ORAL_TABLET | Freq: Two times a day (BID) | ORAL | Status: DC

## 2018-07-13 MED ORDER — PROCHLORPERAZINE EDISYLATE 10 MG/2ML IJ SOLN: 5.0000 mg | Freq: Four times a day (QID) | INTRAMUSCULAR | Status: DC | PRN

## 2018-07-13 MED ORDER — PROCHLORPERAZINE 25 MG RE SUPP: 12.5000 mg | Freq: Four times a day (QID) | RECTAL | Status: DC | PRN

## 2018-07-13 MED ORDER — PROCHLORPERAZINE MALEATE 5 MG PO TABS: 5.0000 mg | ORAL_TABLET | Freq: Four times a day (QID) | ORAL | Status: DC | PRN

## 2018-07-13 MED ORDER — HYDROMORPHONE HCL 1 MG/ML PO LIQD
1.0000 mg | ORAL | Status: DC | PRN
  Administered 2018-07-13 – 2018-07-27 (×7): 1 mg via ORAL
  Filled 2018-07-13 (×9): qty 1

## 2018-07-13 MED ORDER — FUROSEMIDE 20 MG PO TABS
20.0000 mg | ORAL_TABLET | Freq: Two times a day (BID) | ORAL | Status: DC
  Administered 2018-07-13 – 2018-07-30 (×27): 20 mg via ORAL
  Filled 2018-07-13 (×32): qty 1

## 2018-07-13 MED ORDER — CLOBETASOL PROPIONATE 0.05 % EX OINT
TOPICAL_OINTMENT | Freq: Two times a day (BID) | CUTANEOUS | Status: DC
  Administered 2018-07-14 – 2018-07-19 (×3): via TOPICAL
  Filled 2018-07-13: qty 15

## 2018-07-13 MED ORDER — HYDROCODONE-ACETAMINOPHEN 7.5-325 MG PO TABS
1.0000 | ORAL_TABLET | ORAL | Status: DC | PRN
  Administered 2018-07-14 – 2018-07-27 (×27): 1 via ORAL
  Filled 2018-07-13 (×31): qty 1

## 2018-07-13 MED ORDER — HYDROCODONE-ACETAMINOPHEN 7.5-325 MG PO TABS
1.0000 | ORAL_TABLET | Freq: Every day | ORAL | Status: DC
  Administered 2018-07-13 – 2018-07-26 (×11): 1 via ORAL
  Filled 2018-07-13 (×14): qty 1

## 2018-07-13 MED ORDER — CAMPHOR-MENTHOL 0.5-0.5 % EX LOTN
TOPICAL_LOTION | CUTANEOUS | Status: DC | PRN
  Filled 2018-07-13: qty 222

## 2018-07-13 MED ORDER — MAGNESIUM OXIDE 400 (241.3 MG) MG PO TABS
200.0000 mg | ORAL_TABLET | Freq: Every day | ORAL | Status: DC
  Administered 2018-07-14 – 2018-07-26 (×10): 200 mg via ORAL
  Filled 2018-07-13 (×14): qty 1

## 2018-07-13 MED ORDER — GUAIFENESIN 100 MG/5ML PO SOLN
10.0000 mL | Freq: Four times a day (QID) | ORAL | Status: DC
  Administered 2018-07-13 – 2018-07-30 (×52): 200 mg via ORAL
  Filled 2018-07-13 (×12): qty 10
  Filled 2018-07-13: qty 20
  Filled 2018-07-13 (×3): qty 10
  Filled 2018-07-13: qty 100
  Filled 2018-07-13 (×39): qty 10
  Filled 2018-07-13: qty 50
  Filled 2018-07-13 (×2): qty 10

## 2018-07-13 MED ORDER — DIPHENHYDRAMINE HCL 25 MG PO CAPS
25.0000 mg | ORAL_CAPSULE | Freq: Every evening | ORAL | Status: DC | PRN
  Administered 2018-07-14 – 2018-07-19 (×6): 25 mg via ORAL
  Filled 2018-07-13 (×7): qty 1

## 2018-07-13 MED ORDER — FENTANYL 50 MCG/HR TD PT72
1.0000 | MEDICATED_PATCH | TRANSDERMAL | Status: DC
  Administered 2018-07-13 – 2018-07-25 (×5): 1 via TRANSDERMAL
  Filled 2018-07-13 (×6): qty 1

## 2018-07-13 MED ORDER — PRO-STAT SUGAR FREE PO LIQD: 30.0000 mL | Freq: Three times a day (TID) | ORAL | Status: DC

## 2018-07-13 MED ORDER — FLEET ENEMA 7-19 GM/118ML RE ENEM: 1.0000 | ENEMA | Freq: Once | RECTAL | Status: DC | PRN

## 2018-07-13 MED ORDER — PANTOPRAZOLE SODIUM 40 MG PO TBEC
40.0000 mg | DELAYED_RELEASE_TABLET | Freq: Every day | ORAL | Status: DC
  Administered 2018-07-13 – 2018-07-29 (×13): 40 mg via ORAL
  Filled 2018-07-13 (×19): qty 1

## 2018-07-13 MED ORDER — POTASSIUM CHLORIDE CRYS ER 20 MEQ PO TBCR
30.0000 meq | EXTENDED_RELEASE_TABLET | Freq: Two times a day (BID) | ORAL | Status: DC
  Administered 2018-07-13 – 2018-07-30 (×27): 30 meq via ORAL
  Filled 2018-07-13 (×36): qty 1

## 2018-07-13 MED ORDER — GABAPENTIN 300 MG PO CAPS: 300.0000 mg | ORAL_CAPSULE | Freq: Three times a day (TID) | ORAL | Status: DC

## 2018-07-13 MED ORDER — PRO-STAT SUGAR FREE PO LIQD
30.0000 mL | Freq: Three times a day (TID) | ORAL | Status: DC
  Administered 2018-07-13 – 2018-07-29 (×48): 30 mL via ORAL
  Filled 2018-07-13 (×58): qty 30

## 2018-07-13 MED ORDER — FENTANYL 50 MCG/HR TD PT72: 1.0000 | MEDICATED_PATCH | TRANSDERMAL | Status: DC

## 2018-07-13 MED ORDER — SODIUM CHLORIDE 0.9 % IV SOLN
510.0000 mg | Freq: Once | INTRAVENOUS | Status: AC
  Administered 2018-07-14: 510 mg via INTRAVENOUS
  Filled 2018-07-13: qty 17

## 2018-07-13 MED ORDER — CEFAZOLIN SODIUM-DEXTROSE 2-4 GM/100ML-% IV SOLN
2.0000 g | Freq: Three times a day (TID) | INTRAVENOUS | Status: DC
  Administered 2018-07-13 – 2018-07-23 (×29): 2 g via INTRAVENOUS
  Filled 2018-07-13 (×31): qty 100

## 2018-07-13 NOTE — Progress Notes (Signed)
Occupational Therapy Session Note  Patient Details  Name: Brian [name] MRN: [id number] Date of Birth: 01/21/1946  Today's Date: 07/13/2018 OT Individual Time: 5465-0354 OT Individual Time Calculation (min): 83 min    Short Term Goals: Week 2:  OT Short Term Goal 1 (Week 2): Pt will consistently complete basic sliding board transfers with min A OT Short Term Goal 2 (Week 2): Pt will complete consecutive toilet transfers as  well as toileting task with +1 assist only in order to reduce caregiver burden OT Short Term Goal 3 (Week 2): Pt will stand to complete one grooming task in order to increase functional activity tolerance OT Short Term Goal 4 (Week 2): Pt will consistently complete LB dressing task with +1 assist without use of lift equipment in prep for d/c   Skilled Therapeutic Interventions/Progress Updates:    Pt seen for OT session foucsing on functional transfers, activity tolerance and UE strengthening. Pt in supine upon arrival with daughter and wife present. Pt voicing pain 6/10 at surgical site, up to date on all pain meds and repositioned for comfort throughout session. He required significantly increased time with all mobility to do pain and generalized weakness. He transferred to EOB with max A overall, multi-modal cuing for log rolling technique. He was able to independently recall 2/3 back pre-cautions today. Completed transfer via STEDY, +2 mod A to stand from highly elevated EOB. Plan to transfer onto [name], however, pt without endurance/strength to complete needed number of transfers to complete it onto [name], therefore transitioned straight into w/c. Max A +2 for controlled descent into w/c. He self propelled w/c ~101ft for UE strengthening then took remainder of way total A for time and energy conservation.  Completed UE strengthening exercises from w/c level using #2 dowel rod with emphasis on establishing pain free HEP. Able to complete shoulder press, chest press, bicep  curl, and ABduction. Education and demonstration for proper form and technique and ensuring proper body mechanics. Each exercise completed x2 sets of 10 with rest breaks required throughout. Pt returned to room at end of session. Mod A slide board transfer back to bed, mod A to return to supine. Pt left in supine, positioned with pillows for comfort and all needs in reach with wife and daughter present. Spoke with pt's wife outside of room regarding care she is capable of providing at d/c given pt's high level of care he requires. She is concerned about home accessibility from w/c level. She would like to attempt hands on training with sliding board as she feels she would be able to do that easier than with [facility] Hights  lift if this is what pt would require at d/c.   Therapy Documentation Precautions:  Precautions Precautions: Back Precaution Comments: re-educated on BLT, pt cont to be reminded during function Required Braces or Orthoses: Spinal Brace Spinal Brace: Thoracolumbosacral orthotic, Applied in sitting position Restrictions Weight Bearing Restrictions: No   Therapy/Group: Individual Therapy  Brian [name] L 07/13/2018, 7:11 AM

## 2018-07-13 NOTE — Progress Notes (Signed)
Daily Progress Note   Patient Name: Brian [name]       Date: 07/13/2018 DOB: [birth year]  Age: 73 y.o. MRN#: [id number] Attending Physician: Jamse Arn, MD Primary Care Physician: [name], MD Admit Date: 06/29/2018  Reason for Consultation/Follow-up: psychosocial support.   Subjective: Patient is resting in bed. His wife and daughter are at bedside. He worked with therapy today, but wife states it ended early because he had to urinate. She wishes it had lasted longer. She is happy he now has exercises to do at bed level between therapy sessions. His protein intake increased yesterday per wife. They are having to push him to use IS, and he understands the purpose. He was able to reach 1500 for me today.   Current Medications: Scheduled Meds:  . carvedilol  6.25 mg Oral BID WC  . clobetasol ointment   Topical BID  . [START ON 07/14/2018] DULoxetine  30 mg Oral Daily  . feeding supplement (PRO-STAT SUGAR FREE 64)  30 mL Oral TID WC & HS  . fentaNYL  1 patch Transdermal Q72H  . furosemide  20 mg Oral BID  . gabapentin  300 mg Oral TID  . guaiFENesin  10 mL Oral Q6H  . HYDROcodone-acetaminophen  1 tablet Oral QHS  . magnesium oxide  200 mg Oral Daily  . methocarbamol  750 mg Oral QID  . pantoprazole  40 mg Oral Daily  . potassium chloride  30 mEq Oral BID  . senna  2 tablet Oral QHS    Continuous Infusions:   PRN Meds: camphor-menthol, diphenhydrAMINE, HYDROcodone-acetaminophen, HYDROmorphone HCl, menthol-cetylpyridinium **OR** phenol, prochlorperazine **OR** prochlorperazine **OR** prochlorperazine, sodium phosphate  Physical Exam Pulmonary:     Effort: Pulmonary effort is normal.  Skin:    General: Skin is warm and dry.             Vital Signs: BP 109/71 (BP  Location: Right Arm)   Pulse 88   Temp 98.2 F (36.8 C)   Resp 18   Ht 5\' 10"  (1.778 m)   Wt 94.4 kg   SpO2 94%   BMI 29.86 kg/m  SpO2: SpO2: 94 % O2 Device: O2 Device: Nasal Cannula O2 Flow Rate: O2 Flow Rate (L/min): 2.5 L/min  Intake/output summary:   Intake/Output Summary (Last 24 hours) at 07/13/2018  Annex filed at 07/13/2018 1000 Gross per 24 hour  Intake 240 ml  Output 1800 ml  Net -1560 ml   LBM: Last BM Date: 07/12/18 Baseline Weight: Weight: 96.8 kg Most recent weight: Weight: 94.4 kg       Palliative Assessment/Data: 40%      Patient Active Problem List   Diagnosis Date Noted  . Pain   . Pain aggravated by physical activity   . Right hip pain   . Sleep disturbance   . Peripheral edema   . Leukocytosis   . Hyponatremia   . Sinus tachycardia   . Hypomagnesemia   . Orthostatic hypotension   . Labile blood pressure   . Cancer associated pain   . DVT of lower extremity (deep venous thrombosis) ([place])   . Malignant neoplasm of lung ([place])   . Hypoalbuminemia due to protein-calorie malnutrition ([place])   . Acute blood loss anemia   . Postoperative pain   . Pathological compression fracture of lumbar vertebra ([place]) 06/29/2018  . Metastatic lung cancer (metastasis from lung to other site) ([place]) 06/28/2018  . Rash 06/25/2018  . Hypokalemia 06/25/2018  . Normocytic anemia 06/25/2018  . S/P lumbar fusion 06/24/2018  . Palliative care by specialist   . Cancer related pain   . Lung cancer, primary, with metastasis from lung to other site, right ([place]) 06/14/2018  . Squamous cell lung cancer, right ([place]) 06/14/2018  . Lung cancer metastatic to bone ([place]) 06/14/2018  . Bronchitis due to tobacco use 06/07/2018  . Lumbar spine tumor 06/04/2018    Palliative Care Assessment & Plan   Recommendations/Plan:  Continuing rehab.  Left clavicle with cancer pain. Agree with increasing Fentanyl patch for cancer related pain. Would recommend an appetite  stimulant as most recent Albumin is 1.6.  [name] would like education by the dietitian.     Code Status:    Code Status Orders  (From admission, onward)         Start     Ordered   06/29/18 1526  Full code  Continuous     06/29/18 1525        Code Status History    Date Active Date Inactive Code Status Order ID Comments User Context   06/11/2018 2031 06/29/2018 1440 Full Code [id number]  Milagros Loll, MD Inpatient   06/11/2018 2011 06/11/2018 2030 Full Code [id number]  Melina Schools, MD Inpatient   06/04/2018 1344 06/11/2018 2011 Full Code [id number]  Ardeen Jourdain, PA-C Inpatient       Prognosis:  Poor overall. Metastatic cancer with pathologic fracture diagnosed in December.   Discharge Planning:  To Be Determined  Care plan was discussed with RN  Thank you for allowing the Palliative Medicine Team to assist in the care of this patient.   Total Time 35 min Prolonged Time Billed no      Greater than 50%  of this time was spent counseling and coordinating care related to the above assessment and plan.  Asencion Gowda, NP  Please contact Palliative Medicine Team phone at [phone] for questions and concerns.

## 2018-07-13 NOTE — Progress Notes (Signed)
Social Work Patient ID: Brian [name], male   DOB: 1946/01/07, 73 y.o.   MRN: [id number] Met with wife to answer her questions regarding Medicare coverage for home health, SNF and equipment. She voiced he is able to make progress now from all of his set backs when he first came into rehab. Will need to re-group tomorrow at team conference and set new goals and a target discharge date. Wife wants him to be able to stay here long enough to be walking. He has begun doing this now in therapies. He is medically complex and probably to much care for a SNF at this time. Wife is encouraging pt to stay up a long as possible and trying to stay out of the bed. Pt would do better also with consistent therapists. Will discuss at team conference tomorrow.

## 2018-07-13 NOTE — Progress Notes (Signed)
[facility] PHYSICAL MEDICINE & REHABILITATION PROGRESS NOTE  Subjective/Complaints: Patient seen laying in bed this morning.  Daughter at bedside.  He states he slept fairly overnight.  Daughter with questions regarding antibiotics and?  Hip/clavicle injections.  Discussed with Ortho yesterday regarding ID consult.  ROS: + Generalized pain.  Denies CP, shortness of breath, nausea, vomiting, diarrhea.   Objective: Vital Signs: Blood pressure 109/71, pulse 88, temperature 98.2 F (36.8 C), resp. rate 18, height 5\' 10"  (1.778 m), weight 94.4 kg, SpO2 94 %. No results found. Recent Labs    07/12/18 0624  WBC 13.4*  HGB 9.1*  HCT 30.0*  PLT 450*   Recent Labs    07/12/18 0624  NA 132*  K 4.1  CL 97*  CO2 26  GLUCOSE 104*  BUN 12  CREATININE 0.53*  CALCIUM 8.5*    Physical Exam: BP 109/71 (BP Location: Right Arm)   Pulse 88   Temp 98.2 F (36.8 C)   Resp 18   Ht 5\' 10"  (1.778 m)   Wt 94.4 kg   SpO2 94%   BMI 29.86 kg/m  Constitutional: No distress . Vital signs reviewed. HENT: Normocephalic.  Atraumatic. Eyes: EOMI. No discharge. Cardiovascular: RRR.  No JVD. Respiratory: CTA bilaterally.  Normal effort. GI: BS +. Non-distended. Musculoskeletal: Bilateral lower extremity edema, left greater than right, improving Neurological: He is alert and oriented  Dysphonia Able to follow basic commands without difficulty.  Motor:  Right lower extremity: 3-/5 proximal distal, pain inhibition, unchanged Left lower extremity: Hip flexion 3-/5, knee extension 4-/5, ankle dorsiflexion 4/5, pain inhibition, unchanged Skin: Generalized rash, improving Back incision with VAC in place Psychiatric: Flat, easily agitated  Assessment/Plan: 1. Functional deficits secondary to lung cancer with mets to ribs, vertebrae with oncological fracture status post lumbar fusion which require 3+ hours per day of interdisciplinary therapy in a comprehensive inpatient rehab  setting.  Physiatrist is providing close team supervision and 24 hour management of active medical problems listed below.  Physiatrist and rehab team continue to assess barriers to discharge/monitor patient progress toward functional and medical goals  Care Tool:  Bathing  Bathing activity did not occur: Safety/medical concerns(Unable to assess 2/2 drop in BP with upright positioning) Body parts bathed by patient: Right arm, Left arm, Chest, Abdomen, Front perineal area, Right upper leg, Left upper leg, Face   Body parts bathed by helper: Buttocks, Right lower leg, Left lower leg     Bathing assist Assist Level: Maximal Assistance - Patient 24 - 49%     Upper Body Dressing/Undressing Upper body dressing Upper body dressing/undressing activity did not occur (including orthotics): Safety/medical concerns(B/P issues ) What is the patient wearing?: Pull over shirt, Orthosis Orthosis activity level: Performed by helper  Upper body assist Assist Level: Minimal Assistance - Patient > 75%    Lower Body Dressing/Undressing Lower body dressing      What is the patient wearing?: Pants     Lower body assist Assist for lower body dressing: Minimal Assistance - Patient > 75%     Toileting Toileting Toileting Activity did not occur (Clothing management and hygiene only): Safety/medical concerns(Unable to assess 2/2 drop in BP with upright positioning)  Toileting assist Assist for toileting: 2 Helpers     Transfers Chair/bed transfer  Transfers assist     Chair/bed transfer assist level: Moderate Assistance - Patient 50 - 74%     Locomotion Ambulation   Ambulation assist   Ambulation activity did not occur: Safety/medical concerns  Assist level: Minimal Assistance - Patient > 75% Assistive device: Walker-rolling Max distance: 2   Walk 10 feet activity   Assist     Assist level: Minimal Assistance - Patient > 75% Assistive device: Walker-rolling   Walk 50 feet  activity   Assist           Walk 150 feet activity   Assist           Walk 10 feet on uneven surface  activity   Assist           Wheelchair     Assist Will patient use wheelchair at discharge?: Yes Type of Wheelchair: Manual Wheelchair activity did not occur: Safety/medical concerns(hypotensive)  Wheelchair assist level: Supervision/Verbal cueing Max wheelchair distance: 75    Wheelchair 50 feet with 2 turns activity    Assist    Wheelchair 50 feet with 2 turns activity did not occur: Safety/medical concerns   Assist Level: Supervision/Verbal cueing   Wheelchair 150 feet activity     Assist Wheelchair 150 feet activity did not occur: Safety/medical concerns   Assist Level: Supervision/Verbal cueing      Medical Problem List and Plan: 1.Functional and mobility deficitssecondary to metastatic lung cancer to left clavicle, ribs, L4 vertebral body with fracture. Pt is s/p L4 corpectomy and L3-5 fusion by Dr. Rolena Infante on 06/11/18  Continue CIR  Per hematology/oncology, will await improvement in function and strength prior to chemotherapy.   rad-onc involved.  Poor prognosis.  Status post I&D of back wound 1/17 2. DVT: dopplers done this evening and patient has DVT in left CFV             IVC filter placement on 1/8  Pt not a candidate for anticoagulation due to hematoma and bleeding riskat present although long term he is high VTE risk 3. Pain Management: dilaudid prn. Unable to tolerate Oxycodone   Fentanyl increased to 25 mcg on 1/17, increased to 50 on 1/21  Appreciate palliative care consult- notes reviewed- Robaxin frequency increased, Norco dose increased   Cymbalta added on 1/17 4. Mood:LCSW to follow for evaluation and support.  Cymbalta added 5. Neuropsych: This patientiscapable of making decisions on hisown behalf. 6. Skin/Wound Care:vac in place on back wound per ortho. 7. Fluids/Electrolytes/Nutrition:Monitor I/O.   8.Blood pressure; Intermittent elevation noted   Orthostatic vital signs ordered on 1/10, remains pending on 1/21  Abdominal binder as needed  Monitor with increased mobility 9. ABLA: Continue to monitor with serial checks.   Hemoglobin 9.1 on 1/20  Iron studies consistent with anemia of chronic disease: Ferritin levels very high 10. Hypokalemia:   Potassium 4.1 on 1/20   Supplement increased on 1/13  Magnesium 1.6 on 1/14   Supplement initiated on 1/14 11. Rash: Continue Clobetasol bidas well as hypoallergenic sheets. 12. Neuropathic pain: Improving on gabapentin 300 mg tid 13. Constipation:Managed with miralax prn--patient wants to manage his bowel program. 14.  Hypoalbuminemia  Supplement initiated on 1/8 15.  Sinus tachycardia  Metoprolol 12.5 twice daily started on 1/13, changed to Coreg on 1/15  Improved on 1/20 16.  Hyponatremia  Sodium 135 on 1/17  Continue to monitor 17.  Wound infection  WBCs 13.4 on 1/20  Wound cultures with E. coli, pansensitive   Chest x-ray reviewed with patient, unremarkable for acute infectious process, repeat chest x-ray on 1/16 reviewed showing some lower abnormality-continue to monitor  Afebrile  Appreciate ID recs- IV ceftezole   Antibiotic beads placed by Ortho in wound  Blood  cultures NGTD on 1/21  Back incision as above 18. Peripheral edema  Lasix 20 daily started on 1/15, increased to twice daily on 1/21 local care and elevation  19.  Sleep disturbance  Trazodone DC'd, Benadryl ordered for pruritus and sleep on 1/17  Improving overall. 20.  Pruritus.  Secondary to pain medications  Benadryl as needed, dose increased on 1/17  Improving 21.  Multiple joint pain  Multifactorial  Hip x-ray with degenerative changes  LOS: 14 days A FACE TO FACE EVALUATION WAS PERFORMED  Don Giarrusso [name] 07/13/2018, 8:59 AM

## 2018-07-13 NOTE — Progress Notes (Signed)
[facility] for Infectious Disease  Date of Admission:  06/29/2018     Total days of antibiotics 6          Patient ID: Brian [name] is a 73 y.o. male with  Active Problems:   Pathological compression fracture of lumbar vertebra ([place])   DVT of lower extremity (deep venous thrombosis) (HCC)   Malignant neoplasm of lung (HCC)   Hypoalbuminemia due to protein-calorie malnutrition (HCC)   Acute blood loss anemia   Postoperative pain   Labile blood pressure   Cancer associated pain   Orthostatic hypotension   Leukocytosis   Hyponatremia   Sinus tachycardia   Hypomagnesemia   Sleep disturbance   Peripheral edema   Right hip pain   Pain   Pain aggravated by physical activity   . carvedilol  6.25 mg Oral BID WC  . clobetasol ointment   Topical BID  . [START ON 07/14/2018] DULoxetine  30 mg Oral Daily  . feeding supplement (PRO-STAT SUGAR FREE 64)  30 mL Oral TID WC & HS  . fentaNYL  1 patch Transdermal Q72H  . furosemide  20 mg Oral BID  . gabapentin  300 mg Oral TID  . guaiFENesin  10 mL Oral Q6H  . HYDROcodone-acetaminophen  1 tablet Oral QHS  . magnesium oxide  200 mg Oral Daily  . methocarbamol  750 mg Oral QID  . pantoprazole  40 mg Oral Daily  . potassium chloride  30 mEq Oral BID  . senna  2 tablet Oral QHS    SUBJECTIVE: Patient is sleeping soundly with family at the bedside. They have questions about the plan for treatment.    Allergies  Allergen Reactions  . Oxycodone     Severe N/V  . Minoxidil Palpitations  . Nicotine Palpitations  . Sulfamethoxazole Other (See Comments)    Both parents allergic, avoids med    OBJECTIVE: Vitals:   07/12/18 0423 07/12/18 1541 07/12/18 2021 07/13/18 0538  BP: 109/69 111/63 102/64 109/71  Pulse: 84 97 86 88  Resp: '19 16  18  '$ Temp: 99.1 F (37.3 C) 98.1 F (36.7 C)  98.2 F (36.8 C)  TempSrc: Oral     SpO2: 96% 95% 97% 94%  Weight:      Height:       Body mass index is 29.86  kg/m.  Sleeping soundly - I did not wake him but he is in no distress, breathing comfortably.   Lab Results Lab Results  Component Value Date   WBC 13.4 (H) 07/12/2018   HGB 9.1 (L) 07/12/2018   HCT 30.0 (L) 07/12/2018   MCV 90.9 07/12/2018   PLT 450 (H) 07/12/2018    Lab Results  Component Value Date   CREATININE 0.53 (L) 07/12/2018   BUN 12 07/12/2018   NA 132 (L) 07/12/2018   K 4.1 07/12/2018   CL 97 (L) 07/12/2018   CO2 26 07/12/2018    Lab Results  Component Value Date   ALT 25 07/09/2018   AST 27 07/09/2018   ALKPHOS 115 07/09/2018   BILITOT 0.5 07/09/2018     Microbiology: Recent Results (from the past 240 hour(s))  Aerobic Culture (superficial specimen)     Status: None   Collection Time: 07/08/18  1:38 PM  Result Value Ref Range Status   Specimen Description WOUND BACK  Final   Special Requests NONE  Final   Gram Stain   Final  RARE WBC PRESENT,BOTH PMN AND MONONUCLEAR RARE GRAM VARIABLE ROD Performed at [facility], [place] [address], [place], [place] [address]    Culture   Final    MODERATE KLEBSIELLA PNEUMONIAE MODERATE ESCHERICHIA COLI    Report Status 07/11/2018 FINAL  Final   Organism ID, Bacteria KLEBSIELLA PNEUMONIAE  Final   Organism ID, Bacteria ESCHERICHIA COLI  Final      Susceptibility   Escherichia coli - MIC*    AMPICILLIN <=2 SENSITIVE Sensitive     CEFAZOLIN <=4 SENSITIVE Sensitive     CEFEPIME <=1 SENSITIVE Sensitive     CEFTAZIDIME <=1 SENSITIVE Sensitive     CEFTRIAXONE <=1 SENSITIVE Sensitive     CIPROFLOXACIN <=0.25 SENSITIVE Sensitive     GENTAMICIN <=1 SENSITIVE Sensitive     IMIPENEM <=0.25 SENSITIVE Sensitive     TRIMETH/SULFA <=20 SENSITIVE Sensitive     AMPICILLIN/SULBACTAM <=2 SENSITIVE Sensitive     PIP/TAZO <=4 SENSITIVE Sensitive     Extended ESBL NEGATIVE Sensitive     * MODERATE ESCHERICHIA COLI   Klebsiella pneumoniae - MIC*    AMPICILLIN >=32 RESISTANT Resistant     CEFAZOLIN <=4 SENSITIVE Sensitive      CEFEPIME <=1 SENSITIVE Sensitive     CEFTAZIDIME <=1 SENSITIVE Sensitive     CEFTRIAXONE <=1 SENSITIVE Sensitive     CIPROFLOXACIN <=0.25 SENSITIVE Sensitive     GENTAMICIN <=1 SENSITIVE Sensitive     IMIPENEM <=0.25 SENSITIVE Sensitive     TRIMETH/SULFA <=20 SENSITIVE Sensitive     AMPICILLIN/SULBACTAM 4 SENSITIVE Sensitive     PIP/TAZO <=4 SENSITIVE Sensitive     Extended ESBL NEGATIVE Sensitive     * MODERATE KLEBSIELLA PNEUMONIAE  Culture, blood (routine x 2)     Status: None (Preliminary result)   Collection Time: 07/08/18  2:59 PM  Result Value Ref Range Status   Specimen Description BLOOD LEFT ANTECUBITAL  Final   Special Requests   Final    BOTTLES DRAWN AEROBIC ONLY Blood Culture adequate volume   Culture   Final    NO GROWTH 4 DAYS Performed at [facility], [place] [address] [place], [place] [address]    Report Status PENDING  Incomplete  Culture, blood (routine x 2)     Status: None (Preliminary result)   Collection Time: 07/08/18  3:07 PM  Result Value Ref Range Status   Specimen Description BLOOD RIGHT ANTECUBITAL  Final   Special Requests   Final    BOTTLES DRAWN AEROBIC ONLY Blood Culture adequate volume   Culture   Final    NO GROWTH 4 DAYS Performed at [facility], [place] [address], [place], [place] [address]    Report Status PENDING  Incomplete  Aerobic/Anaerobic Culture (surgical/deep wound)     Status: None (Preliminary result)   Collection Time: 07/09/18  7:37 PM  Result Value Ref Range Status   Specimen Description WOUND  Final   Special Requests SUPERFICIAL SAMPLE A  Final   Gram Stain   Final    ABUNDANT WBC PRESENT,BOTH PMN AND MONONUCLEAR FEW GRAM VARIABLE ROD Performed at [facility], [place] [address], [place], [place] [address]    Culture   Final    FEW ESCHERICHIA COLI NO ANAEROBES ISOLATED; CULTURE IN PROGRESS FOR 5 DAYS    Report Status PENDING  Incomplete   Organism ID, Bacteria ESCHERICHIA COLI  Final       Susceptibility   Escherichia coli - MIC*  AMPICILLIN <=2 SENSITIVE Sensitive     CEFAZOLIN <=4 SENSITIVE Sensitive     CEFEPIME <=1 SENSITIVE Sensitive     CEFTAZIDIME <=1 SENSITIVE Sensitive     CEFTRIAXONE <=1 SENSITIVE Sensitive     CIPROFLOXACIN <=0.25 SENSITIVE Sensitive     GENTAMICIN <=1 SENSITIVE Sensitive     IMIPENEM <=0.25 SENSITIVE Sensitive     TRIMETH/SULFA <=20 SENSITIVE Sensitive     AMPICILLIN/SULBACTAM <=2 SENSITIVE Sensitive     PIP/TAZO <=4 SENSITIVE Sensitive     Extended ESBL NEGATIVE Sensitive     * FEW ESCHERICHIA COLI  Aerobic/Anaerobic Culture (surgical/deep wound)     Status: None (Preliminary result)   Collection Time: 07/09/18  7:37 PM  Result Value Ref Range Status   Specimen Description WOUND  Final   Special Requests DEEP SAMPLE B  Final   Gram Stain   Final    ABUNDANT WBC PRESENT,BOTH PMN AND MONONUCLEAR FEW GRAM VARIABLE ROD RARE GRAM POSITIVE COCCI Performed at [facility], [place] [address] [place], [place] [address]    Culture   Final    MODERATE ESCHERICHIA COLI NO ANAEROBES ISOLATED; CULTURE IN PROGRESS FOR 5 DAYS    Report Status PENDING  Incomplete   Organism ID, Bacteria ESCHERICHIA COLI  Final      Susceptibility   Escherichia coli - MIC*    AMPICILLIN <=2 SENSITIVE Sensitive     CEFAZOLIN <=4 SENSITIVE Sensitive     CEFEPIME <=1 SENSITIVE Sensitive     CEFTAZIDIME <=1 SENSITIVE Sensitive     CEFTRIAXONE <=1 SENSITIVE Sensitive     CIPROFLOXACIN <=0.25 SENSITIVE Sensitive     GENTAMICIN <=1 SENSITIVE Sensitive     IMIPENEM <=0.25 SENSITIVE Sensitive     TRIMETH/SULFA <=20 SENSITIVE Sensitive     AMPICILLIN/SULBACTAM <=2 SENSITIVE Sensitive     PIP/TAZO <=4 SENSITIVE Sensitive     Extended ESBL NEGATIVE Sensitive     * MODERATE ESCHERICHIA COLI    ASSESSMENT: 73 y.o. male with h/o squamous cell lung ca with bone mets now with post-op wound infection of lumbar spine after several trips to OR for stabilization of L3-5  after L4 corpectomy. These have been complicated by bleeding/hematoma and now infection due to klebsiella and e coli. Check ESR/CRP in AM with next lab draw and follow closer to the end of therapy. Check CBC weekly while on cefazolin.   Plan for 4 weeks of IV antibiotics. Wound vac in place currently to be changed every M/W/F with WOC team and will return to OR to re-assess wound and remove beads from what I am told from his family. Not certain as to how long he will be here for rehab stay - if he is still here in 3 weeks please call us back for re-assessment while in CIR vs outpatient appointment in 3 weeks.   PLAN: 1. Continue IV cefazolin 2 gm IV q8h  2. PICC line to be placed (ordered)  3. ESR/CRP in AM  OPAT ORDERS:  Diagnosis: Post-Op surgical wound infection   Culture Result: E coli (pansensitive) & Klebsiella (r-amp)  Allergies  Allergen Reactions  . Oxycodone     Severe N/V  . Minoxidil Palpitations  . Nicotine Palpitations  . Sulfamethoxazole Other (See Comments)    Both parents allergic, avoids med    Discharge antibiotics: Cefazolin 2 gm IV q8h   Duration: 28 days   End Date: February 17th   [place] and Maintenance Per Protocol __ Please pull PIC at completion  of IV antibiotics _x_ Please leave PIC in place until doctor has seen patient or been notified  Labs weekly while on IV antibiotics: _x_ CBC with differential _x_ BMP __ BMP TWICE WEEKLY** __ CMP _x_ CRP _x_ ESR __ Vancomycin trough  Fax weekly labs to [phone]  Clinic Follow Up Appt: 3 weeks - please call back if still inpatient otherwise scheduled for February 11 @ 10:30   Janene Madeira, MSN, NP-C [facility] [place] Cell: [phone] Pager: [phone]  07/13/2018  1:46 PM

## 2018-07-13 NOTE — Progress Notes (Signed)
Physical Therapy Session Note  Patient Details  Name: Brian [name] MRN: [id number] Date of Birth: 06/27/1945  Today's Date: 07/13/2018 PT Individual Time: 1415-1515 PT Individual Time Calculation (min): 60 min   Short Term Goals: Week 2:  PT Short Term Goal 1 (Week 2): supine> sit with min assist PT Short Term Goal 2 (Week 2): slide board transfer with mod assist consistently PT Short Term Goal 3 (Week 2): pt will be able to describe spinal precautions regarding functional movements  Skilled Therapeutic Interventions/Progress Updates:    Patient in supine wife assisted to use urinal.  Donned TH TEDs and abdominal binder in supine.  Performed LE stretch to hamstrings and gluts x 20 sec hold and heel cords.  Rolling with mod A and side to sit mod A.  Donned TLSO in sitting.  Slide board transfer mod to +2 A for safety bed to w/c increased time.  Seated in w/c pushed by staff to therapy gym.  Slide board to higher surface on mat mod A multiple scoots for success.  Sit to stand from elevated mat x 2 with +2 mod A.  Stepping forward and back in walker with mod A for safety, pt c/o shoulder fatigue.  Seated scapular squeezes x 10 w/5 sec hold mod cues for technique and facilitation.  Sitting balance with less UE support x brief bouts during scapular squeezes.  Educated pt and daughter on reason for difficulty with standing and sitting balance with lack of proprioception.  Daughter present throughout and asking for LE therex to do in room.  Slide board transfer back to w/c and assisted to room by staff.  Encouraged to sit x 30 sec prior to asking staff to return to supine.  Daughter in room and patient left with call bell in reach.  Therapy Documentation Precautions:  Precautions Precautions: Back Precaution Comments: re-educated on BLT, pt cont to be reminded during function Required Braces or Orthoses: Spinal Brace Spinal Brace: Thoracolumbosacral orthotic, Applied in sitting  position Restrictions Weight Bearing Restrictions: No Pain: Pain Assessment Pain Score: 4        Therapy/Group: Individual Therapy  Reginia Naas  [place], [place] 07/13/2018  07/13/2018, 4:28 PM

## 2018-07-14 ENCOUNTER — Encounter (HOSPITAL_COMMUNITY): Payer: Self-pay | Admitting: Orthopedic Surgery

## 2018-07-14 ENCOUNTER — Inpatient Hospital Stay (HOSPITAL_COMMUNITY): Payer: Medicare Other

## 2018-07-14 ENCOUNTER — Inpatient Hospital Stay (HOSPITAL_COMMUNITY): Payer: Medicare Other | Admitting: Occupational Therapy

## 2018-07-14 HISTORY — PX: SHOULDER INJECTION: PRO8782

## 2018-07-14 LAB — AEROBIC/ANAEROBIC CULTURE W GRAM STAIN (SURGICAL/DEEP WOUND)

## 2018-07-14 LAB — BASIC METABOLIC PANEL
Anion gap: 7 (ref 5–15)
BUN: 14 mg/dL (ref 8–23)
CO2: 26 mmol/L (ref 22–32)
Calcium: 8.6 mg/dL — ABNORMAL LOW (ref 8.9–10.3)
Chloride: 100 mmol/L (ref 98–111)
Creatinine, Ser: 0.63 mg/dL (ref 0.61–1.24)
GFR calc Af Amer: >60 mL/min (ref >60)
GFR calc non Af Amer: >60 mL/min (ref >60)
Glucose, Bld: 112 mg/dL — ABNORMAL HIGH (ref 70–99)
Potassium: 4 mmol/L (ref 3.5–5.1)
Sodium: 133 mmol/L — ABNORMAL LOW (ref 135–145)

## 2018-07-14 LAB — C-REACTIVE PROTEIN: CRP: 23.5 mg/dL — ABNORMAL HIGH (ref <1.0)

## 2018-07-14 LAB — SEDIMENTATION RATE: Sed Rate: 72 mm/hr — ABNORMAL HIGH (ref 0–16)

## 2018-07-14 MED ORDER — BETAMETHASONE SOD PHOS & ACET 6 (3-3) MG/ML IJ SUSP
12.0000 mg | Freq: Once | INTRAMUSCULAR | Status: AC
  Administered 2018-07-14: 12 mg via INTRA_ARTICULAR
  Filled 2018-07-14: qty 2

## 2018-07-14 MED ORDER — BUPIVACAINE HCL 0.25 % IJ SOLN
10.0000 mL | Freq: Once | INTRAMUSCULAR | Status: AC
  Administered 2018-07-14: 10 mL
  Filled 2018-07-14: qty 10

## 2018-07-14 MED ORDER — LIDOCAINE HCL (PF) 1 % IJ SOLN
5.0000 mL | Freq: Once | INTRAMUSCULAR | Status: AC
  Administered 2018-07-14: 5 mL

## 2018-07-14 NOTE — Progress Notes (Addendum)
Physical Therapy Weekly Progress Note  Patient Details  Name: Brian [name] MRN: [id number] Date of Birth: [birth year]  Beginning of progress report period: 1/16/[id number]} End of progress report period: 07/14/18  Today's Date: 07/14/2018 PT Individual Time: 1300-1400 PT Individual Time Calculation (min): 60 min   Patient has met 1 of 3 short term goals.  Pt had wound debrided 07/09/18, and now has wound vac.    Patient continues to demonstrate the following deficits muscle weakness and muscle joint tightness, decreased cardiorespiratoy endurance and decreased oxygen support, decreased coordination and decreased motor planning, decreased attention, decreased awareness, decreased problem solving, decreased safety awareness, decreased memory and delayed processing and decreased sitting balance, decreased standing balance, decreased postural control and decreased balance strategies , difficulty maintaining back precautions, and therefore will continue to benefit from skilled PT intervention to increase functional independence with mobility.  He is limited buy surgical pain and wound pain.   Patient progressing toward long term goals..  Continue plan of care.  PT Short Term Goals  Week 2:  PT Short Term Goal 1 (Week 2): supine> sit with min assist PT Short Term Goal 1 - Progress (Week 2): Progressing toward goal PT Short Term Goal 2 (Week 2): slide board transfer with mod assist consistently PT Short Term Goal 2 - Progress (Week 2): Met PT Short Term Goal 3 (Week 2): pt will be able to describe spinal precautions regarding functional movements PT Short Term Goal 3 - Progress (Week 2): Progressing toward goal   PT Short Term Goal 1 (Week 3): pt will move supine> sit with min assist PT Short Term Goal 2 (Week 3): pt will perform slide board trasnfer with min assist 50% of trials PT Short Term Goal 3 (Week 3): pt will perform gait x 10' with LRAD PT Short Term Goal 4 (Week 3): pt will state  3/3 spinal precautions  Skilled Therapeutic Interventions/Progress Updates:   Pt resting in bed, recently having had pain meds.  He was unable to rate pain, but stated he was "OK".  Pt on 2L O2 via [place].    Unable to rate pain, but lethargic and needing cues to stay on task; speech slurred.  Supine neuro re-ed via multimodal cues for supine: 1 x 10 bil adductor squeezes, alternating ankle pumps, bil hip internal rotation, 10 x 1 bil pelvic tilts.  Rolling L with supervision; sitting up with mod assist. PT donned abdominal binder, and dtr [name] donned TLSO with min assist.   From raised bed, sit> stand +2 to RW.  Stand/step transfer to w/c to R.  Pt c/o of r hip pain during transfer but was able to continue to move toward chair, min assist.  Attempted sit> stand from w/c +2 but pt unable to raise buttocks from w/c seat height.  R shoulder pain limited pt in pushing up toward standing. Attempted x 3.  PT informed Dr. Posey Pronto of R shoulder pain after tx, in conference.    Pt would benefit from a trial of [facility] board for level transfers.   Pt left resting in w/c with needs at hand, with family present.  With wife and dtr,  PT discussed activity tolerance for sitting up in w/c and recommended he do glut sets if his bottom gets tired.  They verbalized understanding.     Therapy Documentation Precautions:  Precautions Precautions: Back Precaution Comments: re-educated on BLT, pt cont to be reminded during function Required Braces or Orthoses: Spinal Brace Spinal Brace: Thoracolumbosacral orthotic,  Applied in sitting position Restrictions Weight Bearing Restrictions: No      Therapy/Group: Individual Therapy  Emilee Market 07/14/2018, 3:43 PM

## 2018-07-14 NOTE — Progress Notes (Signed)
    Subjective: Procedure(s) (LRB): LUMBAR WOUND DEBRIDEMENT (N/A) 5 Days Post-Op  Patient reports pain as 4 on 0-10 scale.  Reports unchanged leg pain reports incisional back pain   Positive void Negative bowel movement Positive flatus Negative chest pain or shortness of breath  Objective: Vital signs in last 24 hours: Temp:  [98.4 F (36.9 C)-98.9 F (37.2 C)] 98.4 F (36.9 C) (01/21 2012) Pulse Rate:  [86-90] 86 (01/22 0456) Resp:  [18-20] 18 (01/22 0456) BP: (103-121)/(61-69) 106/61 (01/22 1000) SpO2:  [93 %-97 %] 94 % (01/22 0456)  Intake/Output from previous day: 01/21 0701 - 01/22 0700 In: 586.8 [P.O.:255; IV Piggyback:211.8] Out: 1775 [Urine:1775]  Labs: Recent Labs    07/12/18 0624  WBC 13.4*  RBC 3.30*  HCT 30.0*  PLT 450*   Recent Labs    07/12/18 0624 07/14/18 0645  NA 132* 133*  K 4.1 4.0  CL 97* 100  CO2 26 26  BUN 12 14  CREATININE 0.53* 0.63  GLUCOSE 104* 112*  CALCIUM 8.5* 8.6*   No results for input(s): LABPT, INR in the last 72 hours.  Physical Exam: ABD soft Sensation intact distally Intact pulses distally Dorsiflexion/Plantar flexion intact No cellulitis present Compartment soft incision: vac in place.  serios sanginous drainage Body mass index is 29.86 kg/m.   Assessment/Plan: Patient stable  Continue mobilization with physical therapy Continue care  Advance diet Up with therapy  1. Under sterile conditions a left sub-acromial and posterior lumbar trigger point were injected.  No complications noted. 2. Continue rehab - standing and ambulating as much as possible 3. Will re-eval WBC and  Temp curve on Friday - if improving the plan on wound closure next week 4. PICC line pending 5. nutational status remains concerning - will defer to rehab team   Melina Schools, MD [facility] [phone]

## 2018-07-14 NOTE — Progress Notes (Signed)
Occupational Therapy Weekly Progress Note  Patient Details  Name: Brian [name] MRN: [id number] Date of Birth: 1945/12/03  Beginning of progress report period: July 08, 2018 End of progress report period: July 14, 2018   Patient has met 0 of 4 short term goals.  Pt making very limited functional progress during this reporting period. However, pt is slowly making progress with mobility and pain tolerance since I/D on 1/17. Currently pt requiring max A for transition supine>sitting EOB. Mod A even sliding board transfers EOB<> w/c. He is able to complete sit>stand from very highly elevated surface, however, unable to complete subsequent trials of sit>stand due to fatigue. At this time, standing is not functional.  Therapist attempting to provide extensive education to pt and pt's wife who is present for each session regarding pt's CLOF and realistic expectations for pt in regards to mobility status and independence level with ADLs. Goals have been downgraded to mod-max overall, pt's family has not been able to commit to being able to provide this level of care as they are focused only on having pt being at ambulatory level when he d/c from I[facility].   Patient continues to demonstrate the following deficits: muscle weakness, decreased cardiorespiratoy endurance and decreased sitting balance, decreased standing balance, decreased postural control, decreased balance strategies and difficulty maintaining precautions and therefore will continue to benefit from skilled OT intervention to enhance overall performance with BADL and Reduce care partner burden.  Patient not progressing toward long term goals.  See goal revision..  Plan of care revisions: Goals downgraded to mod-max A overall. See POC for goal details..  OT Short Term Goals Week 2:  OT Short Term Goal 1 (Week 2): Pt will consistently complete basic sliding board transfers with min A OT Short Term Goal 1 - Progress (Week 2): Not met OT  Short Term Goal 2 (Week 2): Pt will complete consecutive toilet transfers as  well as toileting task with +1 assist only in order to reduce caregiver burden OT Short Term Goal 2 - Progress (Week 2): Not met OT Short Term Goal 3 (Week 2): Pt will stand to complete one grooming task in order to increase functional activity tolerance OT Short Term Goal 3 - Progress (Week 2): Not met OT Short Term Goal 4 (Week 2): Pt will consistently complete LB dressing task with +1 assist without use of lift equipment in prep for d/c  OT Short Term Goal 4 - Progress (Week 2): Not met Week 3:  OT Short Term Goal 1 (Week 3): Pt will consistently complete sliding board transfer with min A in order to reduce caregiver burden OT Short Term Goal 2 (Week 3): Pt will tolerate completing UB bathing/dressing from EOB with supervision/set-up OT Short Term Goal 3 (Week 3): Pt will complete bed mobility at supervision level during bed level ADLs in order to reduce caregiver burden OT Short Term Goal 4 (Week 3): Pt will tolerate sitting OOB for>2 hours in order to increase functional activity tolerance.   Therapy Documentation Precautions:  Precautions Precautions: Back Precaution Comments: re-educated on BLT, pt cont to be reminded during function Required Braces or Orthoses: Spinal Brace Spinal Brace: Thoracolumbosacral orthotic, Applied in sitting position Restrictions Weight Bearing Restrictions: No   Lincoln Ginley L 07/14/2018, 6:43 PM

## 2018-07-14 NOTE — Procedures (Signed)
Under sterile conditions the shoulder was cleaned.  After discussing the procedure with the patient and his wife he elected to move forward with the subacromial injection.  I palpated the appropriate entry site and advanced the 23-gauge needle into the subacromial space.  Aspiration confirmed I was then then I injected 2 cc of quarter percent Marcaine plain with 2 cc of Celestone.  Patient tolerated this well.  The area was cleaned and a Band-Aid was applied.  Patient was also complaining of right paraspinal pain (positive trigger point pain).  Again I discussed the procedure he elected to move forward.  Palpating the area I cleaned it with Betadine and then an inserted the needle and injected 2 cc of quarter percent Marcaine mixed with 2 cc of Celestone.  Patient tolerated this well there was no adverse events.  Band-Aid was applied.

## 2018-07-14 NOTE — Plan of Care (Signed)
  Problem: Consults Goal: RH SPINAL CORD INJURY PATIENT EDUCATION Description  See Patient Education module for education specifics.  Outcome: Progressing Goal: Skin Care Protocol Initiated - if Braden Score 18 or less Description If consults are not indicated, leave blank or document N/A Outcome: Progressing   Problem: SCI BOWEL ELIMINATION Goal: RH STG MANAGE BOWEL WITH ASSISTANCE Description STG Manage Bowel with Mod.Assistance.  Outcome: Progressing Flowsheets (Taken 07/14/2018 1317) STG: Pt will manage bowels with assistance: 4-Minimum assistance Goal: RH STG SCI MANAGE BOWEL WITH MEDICATION WITH ASSISTANCE Description STG SCI Manage bowel with medication with Mod.assistance.  Outcome: Progressing   Problem: SCI BLADDER ELIMINATION Goal: RH STG MANAGE BLADDER WITH ASSISTANCE Description STG Manage Bladder With mod.Assistance  Outcome: Progressing Flowsheets (Taken 07/14/2018 1317) STG: Pt will manage bladder with assistance: 5-Supervision/set up   Problem: RH SKIN INTEGRITY Goal: RH STG SKIN FREE OF INFECTION/BREAKDOWN Description With mod. Assist.  Outcome: Progressing Goal: RH STG MAINTAIN SKIN INTEGRITY WITH ASSISTANCE Description STG Maintain Skin Integrity With Mod.Assistance.  Outcome: Progressing Flowsheets (Taken 07/14/2018 1317) STG: Maintain skin integrity with assistance: 1-Total assistance Goal: RH STG ABLE TO PERFORM INCISION/WOUND CARE W/ASSISTANCE Description STG Able To Perform Incision/Wound Care With Mod.Assistance.  Outcome: Progressing Flowsheets (Taken 07/14/2018 1317) STG: Pt will be able to perform incision/wound care with assistance: 1-Total assistance   Problem: RH SAFETY Goal: RH STG ADHERE TO SAFETY PRECAUTIONS W/ASSISTANCE/DEVICE Description STG Adhere to Safety Precautions With Mod.Assistance/Device.  Outcome: Progressing Flowsheets (Taken 07/14/2018 1317) STG:Pt will adhere to safety precautions with assistance/device: 4-Minimal  assistance   Problem: RH PAIN MANAGEMENT Goal: RH STG PAIN MANAGED AT OR BELOW PT'S PAIN GOAL Description Less than 3,on 1 to 10 scale.  Outcome: Progressing

## 2018-07-14 NOTE — Consult Note (Signed)
[facility] Nurse wound consult note Patient receiving care in [facility] 4M10.  Daughter at bedside. Reason for Consult: VAC dressing change with bridging to right side and mepitel over upper incision line and along spine. Wound type: Surgical incision Wound bed: Mixture of pink, yellow, brown with pink the majority Drainage (amount, consistency, odor) serosanginous in VAC cannister Periwound: intact Dressing procedure/placement/frequency: MWF VAC change with Mepitel.  One piece of black foam removed from wound bed, one piece placed in wound bed. Immediate seal obtained. Patient tolerated very well and thanked me for being so gentle.  No foul odor, no induration, no erythema at wound site. Val Riles, RN, MSN, CWOCN, CNS-BC, pager [phone]

## 2018-07-14 NOTE — Progress Notes (Signed)
Occupational Therapy Session Note  Patient Details  Name: Brian [name] MRN: [id number] Date of Birth: 04-25-1946  Today's Date: 07/14/2018 OT Individual Time: 0945-1100 OT Individual Time Calculation (min): 75 min    Short Term Goals: Week 2:  OT Short Term Goal 1 (Week 2): Pt will consistently complete basic sliding board transfers with min A OT Short Term Goal 2 (Week 2): Pt will complete consecutive toilet transfers as  well as toileting task with +1 assist only in order to reduce caregiver burden OT Short Term Goal 3 (Week 2): Pt will stand to complete one grooming task in order to increase functional activity tolerance OT Short Term Goal 4 (Week 2): Pt will consistently complete LB dressing task with +1 assist without use of lift equipment in prep for d/c   Skilled Therapeutic Interventions/Progress Updates:    Pt seen for OT ADL bathing/dressing session. Pt in supine upon arrival with wife present. Pt agreeable to tx session and voiced pain 6/10 in back, pre-medicated prior to tx session and willing to participate in therapy as able. Increased time required with all mobility due to pain. Completed LB bathing from bed level, pt able to reach peri-area and top of thighs. Therapist assisted with lower legs and wife performed buttock hygiene per pt request. Education provided regarding proper body mechanics and bed mobility techniques.  Pt transferred to sitting EOB with max A and increased time. Seated EOB, pants threaded total A. He stood from highly elevated EOM with mod-max A for pants to be pulled up total A. Seated rest break required following ~10 seconds in standing. Attempted to complete stand pivot transfer to w/c, however, pt unable to tolerate due to decreased activity tolerance, therefore completed min-mod A sliding board transfer to w/c, cuing for head/hip relationship and hand placement.  Pt left seated in w/c at sink to complete grooming and UB bathing/dressing with wife's  assist. NT present.  Extensive education provided regarding pt's CLOF, building OOB tolerance, 3:1 Hospital stay to recovery ratio, continuum of care, therapy goals and d/c planning.   Therapy Documentation Precautions:  Precautions Precautions: Back Precaution Comments: re-educated on BLT, pt cont to be reminded during function Required Braces or Orthoses: Spinal Brace Spinal Brace: Thoracolumbosacral orthotic, Applied in sitting position Restrictions Weight Bearing Restrictions: No   Therapy/Group: Individual Therapy  Ifeoluwa Beller L 07/14/2018, 7:10 AM

## 2018-07-14 NOTE — Progress Notes (Signed)
[facility] PHYSICAL MEDICINE & REHABILITATION PROGRESS NOTE  Subjective/Complaints: Patient seen laying in bed this morning.  He is getting his wound VAC change.  He states he slept well overnight.  He notes improvement in pain.  Daughter at bedside, who has questions regarding wound, fatigue, IV antibiotics.  ROS: + Generalized pain, improving.  Denies CP, shortness of breath, nausea, vomiting, diarrhea.   Objective: Vital Signs: Blood pressure 107/63, pulse 86, temperature 98.4 F (36.9 C), resp. rate 18, height 5' 10" (1.778 m), weight 94.4 kg, SpO2 94 %. [place] Ekg Site Rite  Result Date: 07/13/2018 If Site Rite image not attached, placement could not be confirmed due to current cardiac rhythm.  Recent Labs    07/12/18 0624  WBC 13.4*  HGB 9.1*  HCT 30.0*  PLT 450*   Recent Labs    07/12/18 0624 07/14/18 0645  NA 132* 133*  K 4.1 4.0  CL 97* 100  CO2 26 26  GLUCOSE 104* 112*  BUN 12 14  CREATININE 0.53* 0.63  CALCIUM 8.5* 8.6*    Physical Exam: BP 107/63 (BP Location: Right Arm)   Pulse 86   Temp 98.4 F (36.9 C)   Resp 18   Ht 5' 10" (1.778 m)   Wt 94.4 kg   SpO2 94%   BMI 29.86 kg/m  Constitutional: No distress . Vital signs reviewed. HENT: Normocephalic.  Atraumatic. Eyes: EOMI. No discharge. Cardiovascular: RRR.  No JVD. Respiratory: CTA bilaterally.  Normal effort. GI: BS +. Non-distended. Musculoskeletal: Bilateral lower extremity edema, left greater than right, continues to improve Neurological: He is alert and oriented  Dysphonia Able to follow basic commands without difficulty.  Motor:  Right lower extremity: 3-/5 proximal distal, pain inhibition, stable Left lower extremity: Hip flexion 3-/5, knee extension 4-/5, ankle dorsiflexion 4/5, pain inhibition, stable Skin: Generalized rash, improving Back incision with good granulation tissue and antibiotic beads in place. Psychiatric: Flat, easily agitated  Assessment/Plan: 1. Functional  deficits secondary to lung cancer with mets to ribs, vertebrae with oncological fracture status post lumbar fusion which require 3+ hours per day of interdisciplinary therapy in a comprehensive inpatient rehab setting.  Physiatrist is providing close team supervision and 24 hour management of active medical problems listed below.  Physiatrist and rehab team continue to assess barriers to discharge/monitor patient progress toward functional and medical goals  Care Tool:  Bathing  Bathing activity did not occur: Safety/medical concerns(Unable to assess 2/2 drop in BP with upright positioning) Body parts bathed by patient: Right arm, Left arm, Chest, Abdomen, Front perineal area, Right upper leg, Left upper leg, Face   Body parts bathed by helper: Buttocks, Right lower leg, Left lower leg     Bathing assist Assist Level: Maximal Assistance - Patient 24 - 49%     Upper Body Dressing/Undressing Upper body dressing Upper body dressing/undressing activity did not occur (including orthotics): Safety/medical concerns(B/P issues ) What is the patient wearing?: Pull over shirt, Orthosis Orthosis activity level: Performed by helper  Upper body assist Assist Level: Minimal Assistance - Patient > 75%    Lower Body Dressing/Undressing Lower body dressing      What is the patient wearing?: Pants     Lower body assist Assist for lower body dressing: Minimal Assistance - Patient > 75%     Toileting Toileting Toileting Activity did not occur (Clothing management and hygiene only): Safety/medical concerns(Unable to assess 2/2 drop in BP with upright positioning)  Toileting assist Assist for toileting: 2 Helpers  Transfers Chair/bed transfer  Transfers assist     Chair/bed transfer assist level: Moderate Assistance - Patient 50 - 74%     Locomotion Ambulation   Ambulation assist   Ambulation activity did not occur: Safety/medical concerns  Assist level: Moderate Assistance -  Patient 50 - 74% Assistive device: Walker-rolling Max distance: 2   Walk 10 feet activity   Assist     Assist level: Moderate Assistance - Patient - 50 - 74% Assistive device: Walker-rolling   Walk 50 feet activity   Assist           Walk 150 feet activity   Assist           Walk 10 feet on uneven surface  activity   Assist           Wheelchair     Assist Will patient use wheelchair at discharge?: Yes Type of Wheelchair: Manual Wheelchair activity did not occur: Safety/medical concerns(hypotensive)  Wheelchair assist level: Supervision/Verbal cueing Max wheelchair distance: 75    Wheelchair 50 feet with 2 turns activity    Assist    Wheelchair 50 feet with 2 turns activity did not occur: Safety/medical concerns   Assist Level: Supervision/Verbal cueing   Wheelchair 150 feet activity     Assist Wheelchair 150 feet activity did not occur: Safety/medical concerns   Assist Level: Supervision/Verbal cueing      Medical Problem List and Plan: 1.Functional and mobility deficitssecondary to metastatic lung cancer to left clavicle, ribs, L4 vertebral body with fracture. Pt is s/p L4 corpectomy and L3-5 fusion by Dr. Rolena Infante on 06/11/18  Continue CIR  Per hematology/oncology, awaiting improvement in function and strength prior to chemotherapy.  Rad-onc involved.  Poor prognosis.  Status post I&D of back wound 1/17  Team conference today to discuss current and goals and coordination of care, home and environmental barriers, and discharge planning with nursing, case manager, and therapies.  2. DVT: dopplers done this evening and patient has DVT in left CFV             IVC filter placement on 1/8  Pt not a candidate for anticoagulation due to hematoma and bleeding riskat present although long term he is high VTE risk 3. Pain Management: dilaudid prn. Unable to tolerate Oxycodone   Fentanyl increased to 25 mcg on 1/17, increased to 50 on  1/21  Appreciate palliative care consult- notes reviewed   Cymbalta added on 1/17 4. Mood:LCSW to follow for evaluation and support.  Cymbalta added 5. Neuropsych: This patientiscapable of making decisions on hisown behalf. 6. Skin/Wound Care:vac in place on back wound per ortho. 7. Fluids/Electrolytes/Nutrition:Monitor I/O.  8.Blood pressure:  Abdominal binder as needed  Relatively controlled on 1/22  Monitor with increased mobility 9. ABLA: Continue to monitor with serial checks.   Hemoglobin 9.1 on 1/20  Iron studies consistent with anemia of chronic disease: Ferritin levels high  IV iron ordered 10. Hypokalemia:   Potassium 4.1 on 1/20   Supplement increased on 1/13  Magnesium 1.6 on 1/14   Supplement initiated on 1/14 11. Rash: Continue Clobetasol bidas well as hypoallergenic sheets. 12. Neuropathic pain: Improving on gabapentin 300 mg tid 13. Constipation:Managed with miralax prn--patient wants to manage his bowel program. 14.  Hypoalbuminemia  Supplement initiated on 1/8 15.  Sinus tachycardia  Metoprolol 12.5 twice daily started on 1/13, changed to Coreg on 1/15  Improved on 1/20 16.  Hyponatremia  Sodium 133 on 1/22  Continue to monitor 17.  Wound  infection  WBCs 13.4 on 1/20  Wound cultures with E. coli, pansensitive   Chest x-ray reviewed with patient, unremarkable for acute infectious process, repeat chest x-ray on 1/16 reviewed showing some lower abnormality-continue to monitor  Afebrile  Appreciate ID recs, notes reviewed- IV ceftezole   Antibiotic beads placed by Ortho in wound  Blood cultures NGTD on 1/22  ESR/CRP elevated on 1/22  Back incision as above 18. Peripheral edema  Lasix 20 daily started on 1/15, increased to twice daily on 1/21 local care and elevation  19.  Sleep disturbance  Trazodone DC'd, Benadryl ordered for pruritus and sleep on 1/17  Improving overall. 20.  Pruritus.  Secondary to pain medications  Benadryl as needed,  dose increased on 1/17  Improving 21.  Multiple joint pain  Multifactorial  Hip x-ray with degenerative changes  LOS: 15 days A FACE TO FACE EVALUATION WAS PERFORMED   [name] 07/14/2018, 9:13 AM

## 2018-07-14 NOTE — Patient Care Conference (Signed)
Inpatient RehabilitationTeam Conference and Plan of Care Update Date: 07/14/2018   Time: 2:20 pm    Patient Name: Brian [name]      Medical Record Number: [id number]  Date of Birth: 1945-11-24 Sex: Male         Room/Bed: 4M10C/4M10C-01 Payor Info: Payor: MEDICARE / Plan: MEDICARE PART A AND B / Product Type: *No Product type* /    Admitting Diagnosis: metastatic lung cancer  Admit Date/Time:  06/29/2018  2:39 PM Admission Comments: No comment available   Primary Diagnosis:  <principal problem not specified> Principal Problem: <principal problem not specified>  Patient Active Problem List   Diagnosis Date Noted  . Pain   . Pain aggravated by physical activity   . Right hip pain   . Sleep disturbance   . Peripheral edema   . Leukocytosis   . Hyponatremia   . Sinus tachycardia   . Hypomagnesemia   . Orthostatic hypotension   . Labile blood pressure   . Cancer associated pain   . DVT of lower extremity (deep venous thrombosis) ([place])   . Malignant neoplasm of lung ([place])   . Hypoalbuminemia due to protein-calorie malnutrition ([place])   . Acute blood loss anemia   . Postoperative pain   . Pathological compression fracture of lumbar vertebra ([place]) 06/29/2018  . Metastatic lung cancer (metastasis from lung to other site) ([place]) 06/28/2018  . Rash 06/25/2018  . Hypokalemia 06/25/2018  . Normocytic anemia 06/25/2018  . S/P lumbar fusion 06/24/2018  . Palliative care by specialist   . Cancer related pain   . Lung cancer, primary, with metastasis from lung to other site, right ([place]) 06/14/2018  . Squamous cell lung cancer, right ([place]) 06/14/2018  . Lung cancer metastatic to bone ([place]) 06/14/2018  . Bronchitis due to tobacco use 06/07/2018  . Lumbar spine tumor 06/04/2018    Expected Discharge Date: Expected Discharge Date: 07/30/18  Team Members Present: Physician leading conference: Dr. Delice Lesch Social Worker Present: Ovidio Kin, LCSW Nurse Present: Frances Maywood,  RN PT Present: Georjean Mode, PT OT Present: Amy Rounds, OT SLP Present: Windell Moulding, SLP     Current Status/Progress Goal Weekly Team Focus  Medical   Functional and mobility deficits secondary to metastatic lung cancer to left clavicle, ribs, L4 vertebral body with fracture. Pt is s/p L4 corpectomy and L3-5 fusion by Dr. Rolena Infante on 06/11/18  Improve mobility, pain, edema, wound infection, wound care  See above   Bowel/Bladder   Contient of bowel/bladder, 07/12/18  Remain conitnent of bowel/bladder  Assisit with bowel/bladder needs PRN   Swallow/Nutrition/ Hydration             ADL's   Mod A basic slide board transfers; Supervision UB bathing/dressing; total A LB dressing from bed level. Requires significantly increased time to complete all tasks 2/2 pain and decreased functional activity tolerance  Set at min A overall, will likely be downgraded to slow pt progress and change in medical status  OOB tolerance, basic transfers, family educaton   Mobility   as of 1/22: mod assist bed and level slide board transfer; +2 for sit> stand from elevate bed; w/c propulsion and gait not a focus in last week  min assist bed and basic transfers, mod assist car transfer, w/c prop x 150 '  (dc'd gait goals last week)  activity tolerance, bed mobility, transfers, pain, family and pt ed   Communication             Safety/Cognition/  Behavioral Observations            Pain   Pain management with PRN Dilaudid and Norco  ,2  Assess and treat pain a shift and as needed   Skin   back incision with wound vac  Maintain skin integrity  Assess skin q shift and as needed      *See Care Plan and progress notes for long and short-term goals.     Barriers to Discharge  Current Status/Progress Possible Resolutions Date Resolved   Physician    Medical stability;Other (comments);Pending chemo/radiation;IV antibiotics;Wound Care     See above  Therapies, optimize pain meds, supplementing K+/Mag, optimize pain  meds, follow labs      Nursing                  PT                    OT                  SLP                SW                Discharge Planning/Teaching Needs:  Wife wants to get pt home feels have just started rehab due to set backs with filter and infection in back. Discussed options and pt and wife want him to go home.       Team Discussion:  Pt beginning over after multiple medical issues-I & D of wound, IVC filter and IV antibiotics. Goals now mod assist wheelchair level no ambulation goals. Edema improving-pain management on-going. Nursing to get out of bed before therapy session. Has made slow and steady gains since surgery Friday 1/17. Come up with realistic discharge goal with pt and family  Revisions to Treatment Plan:  DC 2/7    Continued Need for Acute Rehabilitation Level of Care: The patient requires daily medical management by a physician with specialized training in physical medicine and rehabilitation for the following conditions: Daily direction of a multidisciplinary physical rehabilitation program to ensure safe treatment while eliciting the highest outcome that is of practical value to the patient.: Yes Daily medical management of patient stability for increased activity during participation in an intensive rehabilitation regime.: Yes Daily analysis of laboratory values and/or radiology reports with any subsequent need for medication adjustment of medical intervention for : Post surgical problems;Other;Wound care problems   I attest that I was present, lead the team conference, and concur with the assessment and plan of the team.   Elease Hashimoto 07/16/2018, 8:37 AM

## 2018-07-14 NOTE — Progress Notes (Signed)
Social Work Patient ID: Brian [name], male   DOB: May 26, 1946, 73 y.o.   MRN: [id number] Wife has concerns she feels pt is being over medicated on pain meds. Feels he is falling asleep while eating and not able to fully participate in his therapies. Feels therapists are not pushing him enough and plans to talk with MD and therapists regarding this. Have asked Pam-PA to see and address her medical questions and pain meds.

## 2018-07-14 NOTE — Progress Notes (Addendum)
Social Work Patient ID: Brian [name], male   DOB: 02-03-1946, 73 y.o.   MRN: [id number] Met with pt and wife to discuss team conference goals of min-mod wheelchair level and target discharge date 2/7. Both disappointed he does not have walking goals. This what he really wants to do by the time he leaves here. Discussed he would need to be able to stand first and take steps. Discussed the pain medications and the fine line between his pain being bearable and him falling asleep during a session. This is an on-going issue.  Made aware will meet again next Wednesday to discuss his progress and see where we are then. Do not feel either pt or wife are as realistic and continue to look back at what he was doing before coming to rehab and the fact he wants to walk out of here. Will continue to work on a realistic plan for discharge. Aware MD to address his shoulder and hip pain. Also to make sure pt is up out of bed before the therapist gets there so therapy session will not be focused on getting him out of bed for most of their sessions.

## 2018-07-15 ENCOUNTER — Inpatient Hospital Stay (HOSPITAL_COMMUNITY): Payer: Medicare Other | Admitting: Occupational Therapy

## 2018-07-15 ENCOUNTER — Inpatient Hospital Stay (HOSPITAL_COMMUNITY): Payer: Medicare Other | Admitting: Physical Therapy

## 2018-07-15 ENCOUNTER — Inpatient Hospital Stay (HOSPITAL_COMMUNITY): Payer: Medicare Other

## 2018-07-15 LAB — TYPE AND SCREEN
ABO/RH(D): O NEG
Antibody Screen: NEGATIVE

## 2018-07-15 MED ORDER — POLYETHYLENE GLYCOL 3350 17 G PO PACK
17.0000 g | PACK | Freq: Every day | ORAL | Status: DC | PRN
  Administered 2018-07-15 – 2018-07-16 (×2): 17 g via ORAL
  Filled 2018-07-15 (×3): qty 1

## 2018-07-15 MED ORDER — SODIUM CHLORIDE 0.9% FLUSH
10.0000 mL | INTRAVENOUS | Status: DC | PRN
  Administered 2018-07-17 – 2018-07-27 (×5): 10 mL
  Filled 2018-07-15 (×5): qty 40

## 2018-07-15 MED ORDER — SENNOSIDES-DOCUSATE SODIUM 8.6-50 MG PO TABS
2.0000 | ORAL_TABLET | Freq: Every day | ORAL | Status: DC
  Administered 2018-07-15 – 2018-07-23 (×9): 2 via ORAL
  Filled 2018-07-15 (×13): qty 2

## 2018-07-15 MED ORDER — POLYETHYLENE GLYCOL 3350 17 G PO PACK: 17.0000 g | PACK | Freq: Two times a day (BID) | ORAL | Status: DC | PRN

## 2018-07-15 MED ORDER — DULOXETINE HCL 30 MG PO CPEP
60.0000 mg | ORAL_CAPSULE | Freq: Every day | ORAL | Status: DC
  Administered 2018-07-16 – 2018-07-30 (×13): 60 mg via ORAL
  Filled 2018-07-15 (×14): qty 2

## 2018-07-15 MED ORDER — DICLOFENAC SODIUM 1 % TD GEL
2.0000 g | Freq: Four times a day (QID) | TRANSDERMAL | Status: DC
  Administered 2018-07-15 – 2018-07-25 (×22): 2 g via TOPICAL
  Filled 2018-07-15: qty 100

## 2018-07-15 MED ORDER — SENNOSIDES-DOCUSATE SODIUM 8.6-50 MG PO TABS: 2.0000 | ORAL_TABLET | Freq: Every day | ORAL | Status: DC

## 2018-07-15 MED ORDER — POLYETHYLENE GLYCOL 3350 17 G PO PACK: 17.0000 g | PACK | Freq: Every day | ORAL | Status: DC

## 2018-07-15 NOTE — Progress Notes (Signed)
Occupational Therapy Session Note  Patient Details  Name: Brian [name] MRN: [id number] Date of Birth: 01-18-1946  Today's Date: 07/15/2018 OT Individual Time: 1304-1330 OT Individual Time Calculation (min): 26 min    Short Term Goals: Week 1:  OT Short Term Goal 1 (Week 1): Pt will complete transfer to toillet/BSC with mod A using LRAD OT Short Term Goal 1 - Progress (Week 1): Not met OT Short Term Goal 2 (Week 1): Pt will thread B LEs into pants with steadying assisting using AE PRN OT Short Term Goal 2 - Progress (Week 1): Met OT Short Term Goal 3 (Week 1): Pt will tolerate standing at sink to complete 1 grooming task in standing with steadying assist OT Short Term Goal 3 - Progress (Week 1): Not met  Skilled Therapeutic Interventions/Progress Updates:    1;1. Pt received sidelying after RNs finish placing PICC. Pt with no ROM restrictions based on RN report. Pt with 2/10 pain overall and was premedicated declining intervention. Pt sidelying>sitting MOD A overall. Pt sits with supervision and VC for not bracing on tray table to eat lunch. Exited session with pt supine and all needs met  Therapy Documentation Precautions:  Precautions Precautions: Back Precaution Comments: re-educated on BLT, pt cont to be reminded during function Required Braces or Orthoses: Spinal Brace Spinal Brace: Thoracolumbosacral orthotic, Applied in sitting position Restrictions Weight Bearing Restrictions: No General:   Vital Signs:  Pain: Pain Assessment Pain Scale: 0-10 Pain Score: 2 (overall) Pain Type: Acute pain Pain Location: Shoulder Pain Orientation: Right Pain Descriptors / Indicators: Aching Pain Frequency: Constant Pain Onset: On-going Pain Intervention(s): Medication (See eMAR)(norco given) Multiple Pain Sites: Yes   Therapy/Group: Individual Therapy  Tonny Branch 07/15/2018, 1:25 PM

## 2018-07-15 NOTE — Progress Notes (Signed)
Occupational Therapy Session Note  Patient Details  Name: Brian [name] MRN: [id number] Date of Birth: Oct 22, 1945  Today's Date: 07/15/2018 OT Individual Time: 0945-1100 OT Individual Time Calculation (min): 75 min    Short Term Goals: Week 3:  OT Short Term Goal 1 (Week 3): Pt will consistently complete sliding board transfer with min A in order to reduce caregiver burden OT Short Term Goal 2 (Week 3): Pt will tolerate completing UB bathing/dressing from EOB with supervision/set-up OT Short Term Goal 3 (Week 3): Pt will complete bed mobility at supervision level during bed level ADLs in order to reduce caregiver burden OT Short Term Goal 4 (Week 3): Pt will tolerate sitting OOB for>2 hours in order to increase functional activity tolerance.  Skilled Therapeutic Interventions/Progress Updates:    Pt seen for OT session focusing on sit>standing, standing balance/tolerance and UE strengthening/endurance. Pt sitting EOB upon arrival with NT present assisting pt in getting ready for tx session. He voiced pain as "well managed" and not needing intervention. He completed sliding board transfer elevated EOB to downhill w/c. Completed with guarding assist and VCs for hand placement.  He self propelled w/c ~55ft with supervision for UE strengthening, VCs for effective w/c propulsion technique. Taken remaindr of way to therapy gym total A for time and energy conservation. He completed min A even level slide board transfer to therapy mat with min A. Completed sit>stand from highly elevated EOM, max A +2 with use of RW. PT tolerating ~15 seconds in standing. Pt reporting he had incontinent BM while in standing. Returned to sitting EOM and then transitioned to standing in STEDY with max A +2. PT taken back to room in STEDY> PT stood with max+1 in STEDY and hygiene and clothing management completed total A from standing position in STEDY with seated rest breaks required throughout. Following toileting  task, pt returned to w/c and taken to therapy gym total A. Completed 5 minutes on SCI fit arm-cycle, level 3 for UE strengthening/endurance, no complaints of shoulder pain with motion. Pt returned to room at end of session, left sitting up in w/c with wife and RN present. Extensive education provided to pt and wife regarding role of OT, therapy goals and pt's personal goals. [name] requesting pt be put on night bath at this time in order to allow for therapy time to be spent on strengthening and transfers in order to reduce caregiver burden at d/c.   Therapy Documentation Precautions:  Precautions Precautions: Back Precaution Comments: re-educated on BLT, pt cont to be reminded during function Required Braces or Orthoses: Spinal Brace Spinal Brace: Thoracolumbosacral orthotic, Applied in sitting position Restrictions Weight Bearing Restrictions: No   Therapy/Group: Individual Therapy  Zorianna Taliaferro L 07/15/2018, 7:04 AM

## 2018-07-15 NOTE — Progress Notes (Addendum)
Peripherally Inserted Central Catheter/Midline Placement  The IV Nurse has discussed with the patient and/or persons authorized to consent for the patient, the purpose of this procedure and the potential benefits and risks involved with this procedure.  The benefits include less needle sticks, lab draws from the catheter, and the patient may be discharged home with the catheter. Risks include, but not limited to, infection, bleeding, blood clot (thrombus formation), and puncture of an artery; nerve damage and irregular heartbeat and possibility to perform a PICC exchange if needed/ordered by physician.  Alternatives to this procedure were also discussed.  Bard Power PICC patient education guide, fact sheet on infection prevention and patient information card has been provided to patient /or left at bedside.  Consent signed by wife at patient request.  PICC placed by Claretha Cooper, RN/Melissa Nelliston, RN  PICC/Midline Placement Documentation  PICC Single Lumen 00/17/49 PICC Right Basilic 46 cm 0 cm (Active)  Indication for Insertion or Continuance of Line Prolonged intravenous therapies 07/15/2018 12:55 PM  Exposed Catheter (cm) 0 cm 07/15/2018 12:55 PM  Site Assessment Clean;Dry;Intact 07/15/2018 12:55 PM  Line Status Flushed;Saline locked;Blood return noted 07/15/2018 12:55 PM  Dressing Type Transparent 07/15/2018 12:55 PM  Dressing Status Clean;Dry;Intact 07/15/2018 12:55 PM  Dressing Intervention New dressing 07/15/2018 12:55 PM  Dressing Change Due 07/22/18 07/15/2018 12:55 PM       Boubacar Lerette, Nicolette Bang 07/15/2018, 12:55 PM

## 2018-07-15 NOTE — Progress Notes (Signed)
PHYSICAL MEDICINE & REHABILITATION PROGRESS NOTE  Subjective/Complaints: Patient seen laying in bed this morning.  He states he slept well overnight, except for people coming in and out of his room.  He notes improvement in pain, except his right shoulder.  Discussed with patient and daughter risks versus benefits of narcotics.  Patient is pleased with his current pain level and does not want to make any changes at present.  He is willing to be slightly less energetic if it means that his pain is better controlled.  Daughter with questions regarding wound care, pain medications, labs.  ROS: + Right shoulder pain.  Denies CP, shortness of breath, nausea, vomiting, diarrhea.   Objective: Vital Signs: Blood pressure (!) 104/59, pulse 66, temperature 97.8 F (36.6 C), temperature source Oral, resp. rate 19, height '5\' 10"'$  (1.778 m), weight 88.8 kg, SpO2 95 %. [place] Ekg Site Rite  Result Date: 07/13/2018 If Site Rite image not attached, placement could not be confirmed due to current cardiac rhythm.  No results for input(s): WBC, HGB, HCT, PLT in the last 72 hours. Recent Labs    07/14/18 0645  NA 133*  K 4.0  CL 100  CO2 26  GLUCOSE 112*  BUN 14  CREATININE 0.63  CALCIUM 8.6*    Physical Exam: BP (!) 104/59 (BP Location: Left Arm)   Pulse 66   Temp 97.8 F (36.6 C) (Oral)   Resp 19   Ht '5\' 10"'$  (1.778 m)   Wt 88.8 kg   SpO2 95%   BMI 28.09 kg/m  Constitutional: No distress . Vital signs reviewed. HENT: Normocephalic.  Atraumatic. Eyes: EOMI. No discharge. Cardiovascular: RRR.  No JVD. Respiratory: CTA bilaterally.  Normal effort. GI: BS +. Non-distended. Musculoskeletal: Bilateral lower extremity edema, left greater than right, significantly improved Neurological: He is alert and oriented  Dysphonia Able to follow basic commands without difficulty.  Motor:  Right lower extremity: 3-/5 proximal distal, pain inhibition, slowly improving Left lower extremity:  Hip flexion 3-/5, knee extension 4-/5, ankle dorsiflexion 4/5, pain inhibition, slowly improving Skin: Generalized rash, improving Back incision with VAC Psychiatric: Flat, easily agitated  Assessment/Plan: 1. Functional deficits secondary to lung cancer with mets to ribs, vertebrae with oncological fracture status post lumbar fusion which require 3+ hours per day of interdisciplinary therapy in a comprehensive inpatient rehab setting.  Physiatrist is providing close team supervision and 24 hour management of active medical problems listed below.  Physiatrist and rehab team continue to assess barriers to discharge/monitor patient progress toward functional and medical goals  Care Tool:  Bathing  Bathing activity did not occur: Safety/medical concerns(Unable to assess 2/2 drop in BP with upright positioning) Body parts bathed by patient: Right arm, Left arm, Chest, Abdomen, Front perineal area, Right upper leg, Left upper leg, Face   Body parts bathed by helper: Right lower leg, Left lower leg, Buttocks     Bathing assist Assist Level: Moderate Assistance - Patient 50 - 74%     Upper Body Dressing/Undressing Upper body dressing Upper body dressing/undressing activity did not occur (including orthotics): Safety/medical concerns(B/P issues ) What is the patient wearing?: Pull over shirt, Orthosis Orthosis activity level: Performed by helper  Upper body assist Assist Level: Minimal Assistance - Patient > 75%    Lower Body Dressing/Undressing Lower body dressing      What is the patient wearing?: Pants     Lower body assist Assist for lower body dressing: 2 Helpers     Toileting Toileting  Toileting Activity did not occur Landscape architect and hygiene only): Safety/medical concerns(Unable to assess 2/2 drop in BP with upright positioning)  Toileting assist Assist for toileting: 2 Helpers     Transfers Chair/bed transfer  Transfers assist     Chair/bed transfer assist  level: Minimal Assistance - Patient > 75%     Locomotion Ambulation   Ambulation assist   Ambulation activity did not occur: Safety/medical concerns  Assist level: Moderate Assistance - Patient 50 - 74% Assistive device: Walker-rolling Max distance: 2   Walk 10 feet activity   Assist     Assist level: Moderate Assistance - Patient - 50 - 74% Assistive device: Walker-rolling   Walk 50 feet activity   Assist           Walk 150 feet activity   Assist           Walk 10 feet on uneven surface  activity   Assist           Wheelchair     Assist Will patient use wheelchair at discharge?: Yes Type of Wheelchair: Manual Wheelchair activity did not occur: Safety/medical concerns(hypotensive)  Wheelchair assist level: Supervision/Verbal cueing Max wheelchair distance: 75    Wheelchair 50 feet with 2 turns activity    Assist    Wheelchair 50 feet with 2 turns activity did not occur: Safety/medical concerns   Assist Level: Supervision/Verbal cueing   Wheelchair 150 feet activity     Assist Wheelchair 150 feet activity did not occur: Safety/medical concerns   Assist Level: Supervision/Verbal cueing      Medical Problem List and Plan: 1.Functional and mobility deficitssecondary to metastatic lung cancer to left clavicle, ribs, L4 vertebral body with fracture. Pt is s/p L4 corpectomy and L3-5 fusion by Dr. Rolena Infante on 06/11/18  Continue CIR  Per hematology/oncology, awaiting improvement in function and strength prior to chemotherapy.  Rad-onc involved.  Poor prognosis.  Status post I&D of back wound 1/17 2. DVT: dopplers done this evening and patient has DVT in left CFV             IVC filter placement on 1/8  Pt not a candidate for anticoagulation due to hematoma and bleeding riskat present although long term he is high VTE risk 3. Pain Management: dilaudid prn. Unable to tolerate Oxycodone   Fentanyl increased to 25 mcg on 1/17,  increased to 50 on 1/21  Appreciate palliative care consult- notes reviewed   Cymbalta added on 1/17, increased on 1/25  Right subacromial and lumbar paraspinal trigger point injected by Ortho on 1/22-notes reviewed  Voltaren gel added on 1/23 4. Mood:LCSW to follow for evaluation and support.  Cymbalta added 5. Neuropsych: This patientiscapable of making decisions on hisown behalf. 6. Skin/Wound Care:vac in place on back wound per ortho. 7. Fluids/Electrolytes/Nutrition:Monitor I/O.  8.Blood pressure:  Abdominal binder as needed  Relatively controlled on 1/23  Monitor with increased mobility 9. ABLA: Continue to monitor with serial checks.   Hemoglobin 9.1 on 1/20  Iron studies consistent with anemia of chronic disease: Ferritin levels high  IV iron  Labs ordered for tomorrow 10. Hypokalemia:   Potassium 4.1 on 1/20   Supplement increased on 1/13  Magnesium 1.6 on 1/14   Supplement initiated on 1/14  Labs ordered for tomorrow 11. Rash: Continue Clobetasol bidas well as hypoallergenic sheets. 12. Neuropathic pain: Improving on gabapentin 300 mg tid 13. Constipation:Managed with miralax prn--patient wants to manage his bowel program. 14.  Hypoalbuminemia  Supplement initiated on  1/8 15.  Sinus tachycardia  Metoprolol 12.5 twice daily started on 1/13, changed to Coreg on 1/15  Improved 16.  Hyponatremia  Sodium 133 on 1/22  Labs ordered for tomorrow  Continue to monitor 17.  Wound infection  WBCs 13.4 on 1/20  Labs ordered for tomorrow  Wound cultures with E. coli, pansensitive   Chest x-ray reviewed with patient, unremarkable for acute infectious process, repeat chest x-ray on 1/16 reviewed showing some lower abnormality-continue to monitor  Afebrile  Appreciate ID recs, notes reviewed- IV ceftezole   Antibiotic beads placed by Ortho in wound  Blood cultures no growth  ESR/CRP elevated on 1/22  Back incision as above 18. Peripheral edema  Lasix 20 daily  started on 1/15, increased to twice daily on 1/21 local care and elevation   Improving 19.  Sleep disturbance  Trazodone DC'd, Benadryl ordered for pruritus and sleep on 1/17  Improving overall. 20.  Pruritus.  Secondary to pain medications  Benadryl as needed, dose increased on 1/17  Improving 21.  Multiple joint pain  Multifactorial  Hip x-ray with degenerative changes  LOS: 16 days A FACE TO FACE EVALUATION WAS PERFORMED  Kynzli Rease [name] 07/15/2018, 9:08 AM

## 2018-07-15 NOTE — Plan of Care (Signed)
  Problem: Consults Goal: RH SPINAL CORD INJURY PATIENT EDUCATION Description  See Patient Education module for education specifics.  Outcome: Progressing Goal: Skin Care Protocol Initiated - if Braden Score 18 or less Description If consults are not indicated, leave blank or document N/A Outcome: Progressing   Problem: SCI BOWEL ELIMINATION Goal: RH STG MANAGE BOWEL WITH ASSISTANCE Description STG Manage Bowel with Mod.Assistance.  Outcome: Progressing Goal: RH STG SCI MANAGE BOWEL WITH MEDICATION WITH ASSISTANCE Description STG SCI Manage bowel with medication with Mod.assistance.  Outcome: Progressing Goal: RH STG MANAGE BOWEL W/EQUIPMENT W/ASSISTANCE Description STG Manage Bowel With Equipment With mod. Assistance  Outcome: Progressing Goal: RH STG SCI MANAGE BOWEL PROGRAM W/ASSIST OR AS APPROPRIATE Description STG SCI Manage bowel program w/mod.assist or as appropriate.  Outcome: Progressing   Problem: SCI BLADDER ELIMINATION Goal: RH STG MANAGE BLADDER WITH ASSISTANCE Description STG Manage Bladder With mod.Assistance  Outcome: Progressing   Problem: RH SKIN INTEGRITY Goal: RH STG SKIN FREE OF INFECTION/BREAKDOWN Description With mod. Assist.  Outcome: Progressing Goal: RH STG MAINTAIN SKIN INTEGRITY WITH ASSISTANCE Description STG Maintain Skin Integrity With Mod.Assistance.  Outcome: Progressing Goal: RH STG ABLE TO PERFORM INCISION/WOUND CARE W/ASSISTANCE Description STG Able To Perform Incision/Wound Care With Mod.Assistance.  Outcome: Progressing   Problem: RH SAFETY Goal: RH STG ADHERE TO SAFETY PRECAUTIONS W/ASSISTANCE/DEVICE Description STG Adhere to Safety Precautions With Mod.Assistance/Device.  Outcome: Progressing   Problem: RH PAIN MANAGEMENT Goal: RH STG PAIN MANAGED AT OR BELOW PT'S PAIN GOAL Description Less than 3,on 1 to 10 scale.  Outcome: Progressing

## 2018-07-15 NOTE — Progress Notes (Signed)
Physical Therapy Session Note  Patient Details  Name: Brian [name] MRN: [id number] Date of Birth: 1946-02-14  Today's Date: 07/15/2018 PT Individual Time: 1640-1710 PT Individual Time Calculation (min): 30 min   Short Term Goals: Week 3:  PT Short Term Goal 1 (Week 3): pt will move supine> sit with min assist PT Short Term Goal 2 (Week 3): pt will perform slide board trasnfer with min assist 50% of trials PT Short Term Goal 3 (Week 3): pt will perform gait x 10' with LRAD PT Short Term Goal 4 (Week 3): pt will state 3/3 spinal precautions  Skilled Therapeutic Interventions/Progress Updates:   Pt received sitting in WC and agreeable to PT. Pt transported to rehab gym. Beezy board transfer with min assist  To mat table. Sit<>stand from elevated mat height and min-mod assist x 3. Standing balance 2 x 45 sec with min assist from PT, with intermittent reciprocal stepping. Gait in RW x 36ft with min assist and WC follow from daughter. PT instructed pt in WC mobility x 150ft with supervision assist and BLE/BUE and min cues for posture, and turning technique. Beezy board transfer back to bed with min assist from PT. Pt left sitting EOB with family present and all needs in reach.      Therapy Documentation Precautions:  Precautions Precautions: Back Precaution Comments: re-educated on BLT, pt cont to be reminded during function Required Braces or Orthoses: Spinal Brace Spinal Brace: Thoracolumbosacral orthotic, Applied in sitting position Restrictions Weight Bearing Restrictions: No Vital Signs: Therapy Vitals Pulse Rate: 72 BP: 105/62 Patient Position (if appropriate): Lying Oxygen Therapy SpO2: 96 % O2 Device: Nasal Cannula Pain: 4/10 L shoulder. Ice applied   Therapy/Group: Individual Therapy  [name] 07/15/2018, 5:49 PM

## 2018-07-16 ENCOUNTER — Inpatient Hospital Stay (HOSPITAL_COMMUNITY): Payer: Medicare Other

## 2018-07-16 ENCOUNTER — Inpatient Hospital Stay (HOSPITAL_COMMUNITY): Payer: Medicare Other | Admitting: Occupational Therapy

## 2018-07-16 LAB — CBC WITH DIFFERENTIAL/PLATELET
Abs Immature Granulocytes: 0.2 10*3/uL — ABNORMAL HIGH (ref 0.00–0.07)
BASOS ABS: 0 10*3/uL (ref 0.0–0.1)
Band Neutrophils: 1 %
Basophils Relative: 0 %
Eosinophils Absolute: 0 10*3/uL (ref 0.0–0.5)
Eosinophils Relative: 0 %
HCT: 27.5 % — ABNORMAL LOW (ref 39.0–52.0)
Hemoglobin: 8.1 g/dL — ABNORMAL LOW (ref 13.0–17.0)
Lymphocytes Relative: 9 %
Lymphs Abs: 1.4 10*3/uL (ref 0.7–4.0)
MCH: 27.6 pg (ref 26.0–34.0)
MCHC: 29.5 g/dL — ABNORMAL LOW (ref 30.0–36.0)
MCV: 93.5 fL (ref 80.0–100.0)
Monocytes Absolute: 0.6 10*3/uL (ref 0.1–1.0)
Monocytes Relative: 4 %
Myelocytes: 1 %
NEUTROS PCT: 85 %
NRBC: 0 % (ref 0.0–0.2)
Neutro Abs: 13.2 10*3/uL — ABNORMAL HIGH (ref 1.7–7.7)
Platelets: 455 10*3/uL — ABNORMAL HIGH (ref 150–400)
RBC: 2.94 MIL/uL — ABNORMAL LOW (ref 4.22–5.81)
RDW: 15.8 % — ABNORMAL HIGH (ref 11.5–15.5)
WBC: 15.4 10*3/uL — ABNORMAL HIGH (ref 4.0–10.5)
nRBC: 0 /100 WBC

## 2018-07-16 LAB — COMPREHENSIVE METABOLIC PANEL
ALT: 16 U/L (ref 0–44)
AST: 25 U/L (ref 15–41)
Albumin: 1.9 g/dL — ABNORMAL LOW (ref 3.5–5.0)
Alkaline Phosphatase: 107 U/L (ref 38–126)
Anion gap: 6 (ref 5–15)
BILIRUBIN TOTAL: 0.2 mg/dL — AB (ref 0.3–1.2)
BUN: 24 mg/dL — ABNORMAL HIGH (ref 8–23)
CO2: 26 mmol/L (ref 22–32)
Calcium: 8.6 mg/dL — ABNORMAL LOW (ref 8.9–10.3)
Chloride: 103 mmol/L (ref 98–111)
Creatinine, Ser: 0.59 mg/dL — ABNORMAL LOW (ref 0.61–1.24)
Glucose, Bld: 132 mg/dL — ABNORMAL HIGH (ref 70–99)
Potassium: 4.3 mmol/L (ref 3.5–5.1)
Sodium: 135 mmol/L (ref 135–145)
Total Protein: 5.5 g/dL — ABNORMAL LOW (ref 6.5–8.1)

## 2018-07-16 LAB — MAGNESIUM: MAGNESIUM: 2.2 mg/dL (ref 1.7–2.4)

## 2018-07-16 LAB — GLUCOSE, CAPILLARY: Glucose-Capillary: 117 mg/dL — ABNORMAL HIGH (ref 70–99)

## 2018-07-16 MED ORDER — HEPARIN SOD (PORK) LOCK FLUSH 100 UNIT/ML IV SOLN: 250.0000 [IU] | INTRAVENOUS | Status: DC | PRN

## 2018-07-16 NOTE — Progress Notes (Signed)
Physical Therapy Session Note  Patient Details  Name: Brian [name] MRN: [id number] Date of Birth: 1945-11-28  Today's Date: 07/16/2018 PT Individual Time: 1500-1600 PT Individual Time Calculation (min): 60 min   Short Term Goals: Week 3:  PT Short Term Goal 1 (Week 3): pt will move supine> sit with min assist PT Short Term Goal 2 (Week 3): pt will perform slide board trasnfer with min assist 50% of trials PT Short Term Goal 3 (Week 3): pt will perform gait x 10' with LRAD PT Short Term Goal 4 (Week 3): pt will state 3/3 spinal precautions  Skilled Therapeutic Interventions/Progress Updates:    Patient in supine rolled with S and side to sit mod A for legs and supporting trunk.  Total A to don binder and TLSO with wife already donning thigh high teds and shoes.  Patient stand step with RW to w/c about 22" height with +2 A for safety min A.  Patient assisted in w/c to therapy gym with option to walk in parallel bars or stand to play checkers.  Patient concerned about shoulders continuing to heal and not wanting to walk so sit to stand to hi lo table with checker board mod A of 2 increased time and stood x 2 trials 5 minutes each for playing checkers.  C/o LE fatigue, assisted to room in w/c and seated therex consisting of LAQ, hip flexion, adductor squeezes and hip abduction with orange band tied around knees x 5-7 reps each. 5 sec hold with LAQ.  Left in w/c with wife and daughter present and call bell in reach.    Therapy Documentation Precautions:  Precautions Precautions: Back Precaution Comments: re-educated on BLT, pt cont to be reminded during function Required Braces or Orthoses: Spinal Brace Spinal Brace: Thoracolumbosacral orthotic, Applied in sitting position Restrictions Weight Bearing Restrictions: No Pain: Pain Assessment Pain Score: 5  Pain Type: Surgical pain Pain Location: Back Pain Orientation: Lower Pain Descriptors / Indicators: Aching;Discomfort Pain Onset:  On-going Pain Intervention(s): Repositioned;Distraction    Therapy/Group: Individual Therapy  Reginia Naas  [place], [place] 07/16/2018  07/16/2018, 6:09 PM

## 2018-07-16 NOTE — Progress Notes (Addendum)
[facility] PHYSICAL MEDICINE & REHABILITATION PROGRESS NOTE  Subjective/Complaints: Patient seen laying in bed this AM.  Cousin at bedside.  He states he slept well overnight, except for people coming in/out of his room.  He notes good benefit with Voltaren gel.  Cousin notes good day in therapies yesterday. Discussed wound with [place], who states wound is not improving - nurse to contact Ortho.   ROS: Denies CP, shortness of breath, nausea, vomiting, diarrhea.  Objective: Vital Signs: Blood pressure 108/68, pulse 65, temperature (!) 97.5 F (36.4 C), temperature source Oral, resp. rate 19, height _0  (1.778 m), weight 87.9 kg, SpO2 100 %. Dg Shoulder Right  Result Date: 07/15/2018 CLINICAL DATA:  73 year old male with a history right shoulder pain no known injury EXAM: RIGHT SHOULDER - 2+ VIEW COMPARISON:  None. FINDINGS: Questionable linear lucency of the coracoid. Glenohumeral joint appears congruent.  Degenerative changes. Degenerative changes of the acromioclavicular joint. Glenohumeral joint appears congruent. Right upper extremity PICC is partially imaged. IMPRESSION: Questionable linear lucency of the coracoid, which would be atypical location for fracture without history of trauma. Correlation with point tenderness at the coracoid may be useful. Degenerative changes of the acromioclavicular joint and glenohumeral joint. Partially imaged right upper extremity PICC Electronically Signed   By: Corrie Mckusick D.O.   On: 07/15/2018 15:43   Recent Labs    07/16/18 0452  WBC 15.4*  HGB 8.1*  HCT 27.5*  PLT 455*   Recent Labs    07/14/18 0645 07/16/18 0452  NA 133* 135  K 4.0 4.3  CL 100 103  CO2 26 26  GLUCOSE 112* 132*  BUN 14 24*  CREATININE 0.63 0.59*  CALCIUM 8.6* 8.6*    Physical Exam: BP 108/68 (BP Location: Left Arm)   Pulse 65   Temp (!) 97.5 F (36.4 C) (Oral)   Resp 19   Ht _1  (1.778 m)   Wt 87.9 kg   SpO2 100%   BMI 27.81 kg/m  Constitutional: No  distress . Vital signs reviewed. HENT: Normocephalic.  Atraumatic. Eyes: EOMI. No discharge. Cardiovascular: RRR. No JVD. Respiratory: CTA bilaterally. Normal effort. GI: BS +. Non-distended. Musculoskeletal: Bilateral lower extremity edema, left greater than right, continues to improve Neurological: He is alert and oriented  Dysphonia Able to follow basic commands without difficulty.  Motor:  Right lower extremity: 3/5 proximal distal, pain inhibition Left lower extremity: Hip flexion 3-/5, knee extension 4-/5, ankle dorsiflexion 4/5, pain inhibition, slowly stable Skin: Warm and dry. Intact.  Back incision with VAC Psychiatric: Flat  Assessment/Plan: 1. Functional deficits secondary to lung cancer with mets to ribs, vertebrae with oncological fracture status post lumbar fusion which require 3+ hours per day of interdisciplinary therapy in a comprehensive inpatient rehab setting.  Physiatrist is providing close team supervision and 24 hour management of active medical problems listed below.  Physiatrist and rehab team continue to assess barriers to discharge/monitor patient progress toward functional and medical goals  Care Tool:  Bathing  Bathing activity did not occur: Safety/medical concerns(Unable to assess 2/2 drop in BP with upright positioning) Body parts bathed by patient: Right arm, Left arm, Chest, Abdomen, Front perineal area, Right upper leg, Left upper leg, Face   Body parts bathed by helper: Right lower leg, Left lower leg, Buttocks     Bathing assist Assist Level: Moderate Assistance - Patient 50 - 74%     Upper Body Dressing/Undressing Upper body dressing Upper body dressing/undressing activity did not occur (including orthotics):  Safety/medical concerns(B/P issues ) What is the patient wearing?: Pull over shirt, Orthosis Orthosis activity level: Performed by helper  Upper body assist Assist Level: Minimal Assistance - Patient > 75%    Lower Body  Dressing/Undressing Lower body dressing      What is the patient wearing?: Pants     Lower body assist Assist for lower body dressing: 2 Helpers     Toileting Toileting Toileting Activity did not occur (Clothing management and hygiene only): Safety/medical concerns(Unable to assess 2/2 drop in BP with upright positioning)  Toileting assist Assist for toileting: 2 Helpers     Transfers Chair/bed transfer  Transfers assist     Chair/bed transfer assist level: Minimal Assistance - Patient > 75%(beezy board )     Locomotion Ambulation   Ambulation assist   Ambulation activity did not occur: Safety/medical concerns  Assist level: Minimal Assistance - Patient > 75% Assistive device: Walker-platform Max distance: 70f   Walk 10 feet activity   Assist     Assist level: Moderate Assistance - Patient - 50 - 74% Assistive device: Walker-rolling   Walk 50 feet activity   Assist           Walk 150 feet activity   Assist           Walk 10 feet on uneven surface  activity   Assist           Wheelchair     Assist Will patient use wheelchair at discharge?: Yes Type of Wheelchair: Manual Wheelchair activity did not occur: Safety/medical concerns(hypotensive)  Wheelchair assist level: Supervision/Verbal cueing Max wheelchair distance: 1828f   Wheelchair 50 feet with 2 turns activity    Assist    Wheelchair 50 feet with 2 turns activity did not occur: Safety/medical concerns   Assist Level: Supervision/Verbal cueing   Wheelchair 150 feet activity     Assist Wheelchair 150 feet activity did not occur: Safety/medical concerns   Assist Level: Supervision/Verbal cueing      Medical Problem List and Plan: 1.Functional and mobility deficitssecondary to metastatic lung cancer to left clavicle, ribs, L4 vertebral body with fracture. Pt is s/p L4 corpectomy and L3-5 fusion by Dr. BrRolena Infanten 06/11/18  Continue CIR  Per  hematology/oncology, awaiting improvement in function and strength prior to chemotherapy.  Rad-onc involved.  Poor prognosis.  Status post I&D of back wound 1/17 - plan to close wound next week 2. DVT: dopplers done this evening and patient has DVT in left CFV             IVC filter placement on 1/8  Pt not a candidate for anticoagulation due to hematoma and bleeding riskat present although long term he is high VTE risk 3. Pain Management: dilaudid prn. Unable to tolerate Oxycodone   Fentanyl increased to 25 mcg on 1/17, increased to 50 on 1/21  Appreciate palliative care consult- notes reviewed   Cymbalta added on 1/17, increased on 1/24  Right subacromial and lumbar paraspinal trigger point injected by Ortho on 1/22-notes reviewed  Voltaren gel added on 1/23 with improvement 4. Mood:LCSW to follow for evaluation and support.  Cymbalta added  Patient more calm when daughter not present 5. Neuropsych: This patientiscapable of making decisions on hisown behalf. 6. Skin/Wound Care:  vac in place on back wound per ortho, discussed with WOC, suboptimal healing, Ortho to evaluate 7. Fluids/Electrolytes/Nutrition:Monitor I/O.  8.Blood pressure:  Abdominal binder as needed  Relatively controlled on 1/24  Monitor with increased mobility 9.  ABLA: Continue to monitor with serial checks.   Hemoglobin 8.1 on 1/24  Iron studies consistent with anemia of chronic disease: Ferritin levels high  IV iron 10. Hypokalemia:   Potassium 4.3 on 1/24   Supplement increased on 1/13  Magnesium 2.2 on 1/24   Supplement initiated on 1/14 11. Rash: Continue Clobetasol bidas well as hypoallergenic sheets. 12. Neuropathic pain: Improving on gabapentin 300 mg tid 13. Constipation:Managed with miralax prn--patient wants to manage his bowel program. 14.  Hypoalbuminemia  Supplement initiated on 1/8 15.  Sinus tachycardia  Metoprolol 12.5 twice daily started on 1/13, changed to Coreg on  1/15  Improved 16.  Hyponatremia  Sodium 135 on 1/24  Continue to monitor 17.  Wound infection  WBCs 15.4 on 1/24  Wound cultures with E. coli, pansensitive  Chest x-ray reviewed with patient, unremarkable for acute infectious process, repeat chest x-ray on 1/16 reviewed showing some lower abnormality-continue to monitor  Afebrile  Appreciate ID recs, notes reviewed- IV ceftezole   Antibiotic beads placed by Ortho in wound  Blood cultures no growth  ESR/CRP elevated on 1/22  Back incision as above 18. Peripheral edema  Lasix 20 daily started on 1/15, increased to twice daily on 1/21 local care and elevation   Improving 19.  Sleep disturbance  Trazodone DC'd, Benadryl ordered for pruritus and sleep on 1/17  Improving overall. 20.  Pruritus.  Secondary to pain medications  Benadryl as needed, dose increased on 1/17  Improving 21.  Multiple joint pain  Multifactorial  Hip x-ray with degenerative changes  Right shoulder xray reviewed - degenerative changes with ?atypical area of lucency  LOS: 17 days A FACE TO FACE EVALUATION WAS PERFORMED  Keyla Milone [name] 07/16/2018, 11:08 AM

## 2018-07-16 NOTE — Consult Note (Signed)
[facility] Nurse wound consult note Dr. Melina Schools, surgeon for the patient and I spoke by telephone.  He plans to see the wound on Monday around noon with a WOC nurse.  If [facility] Nurse is available, it is preferred she accompany Dr. Rolena Infante for wound evaluation.  Dr. Rolena Infante is considering closing the wound early next week.  Please call him at [phone] to confirm time of joint wound evaluation. Val Riles, RN, MSN, CWOCN, CNS-BC, pager [phone]

## 2018-07-16 NOTE — Progress Notes (Signed)
Occupational Therapy Session Note  Patient Details  Name: Brian [name] MRN: [id number] Date of Birth: [birth year]  Today's Date: 07/16/2018 OT Individual Time: 0945-1100 OT Individual Time Calculation (min): 75 min    Short Term Goals: Week 3:  OT Short Term Goal 1 (Week 3): Pt will consistently complete sliding board transfer with min A in order to reduce caregiver burden OT Short Term Goal 2 (Week 3): Pt will tolerate completing UB bathing/dressing from EOB with supervision/set-up OT Short Term Goal 3 (Week 3): Pt will complete bed mobility at supervision level during bed level ADLs in order to reduce caregiver burden OT Short Term Goal 4 (Week 3): Pt will tolerate sitting OOB for>2 hours in order to increase functional activity tolerance.  Skilled Therapeutic Interventions/Progress Updates:    Pt seen for OT session focusing on functional mobility, sit>stand, and general strengthening/endurance. Pt in supine upon arrival with family assisting with donning of TEDs and shoes. Pt voicing pain as "manageable", no need for intervention, and agreeable to tx session. He transferred to EOB with min A using hospital bed functions. Wife assisted with donning of TLSO with min cuing from therapist. Pt completed min A stand pivot transfer to w/c with RW, standing from highly elevated EOB.  Pt taken to therapy gym total A in w/c for itme and energy conservation. He completed mod-max Squat pivot transfer to therapy mat with +2 assist for safety.  He completed x5sit>stand from highly elevated EOM, mat heihgt >24inches. Min A overall with rest breaks provided btwn trials. While in standing, pt able to shift weight and adjust feet while maintaining standing balance with min A.  Completed lateral leans x5 to r<>L, able to return to midline with supervision. Voiced stretch along oblique, however, no shoulder pain with movement. Education provided regarding functional use of lateral leans.  Completed  dynamic standing activity, removing clothes pins from around waist band in simulation of LB dressing task, completed with CGA for standing balance. Pt able to alternate UE support and maintain intermittent balance without UE support during task. He completed stand pivot transfer back to w/c with min A standing from 24" mat.  Attempted ambulation in hallway, however, despite max A+2 assist to stand, pt unable to stand from 22" height w/c.  Pt returned to room at end of session, left seated in w/c with family members present.  Pt on 2L supplemental O2 with activity, 97%. Dropped down to 1L supplemental O2. Saturation at 93%. RN made aware pt left on 1 L in room at rest.  Therapy Documentation Precautions:  Precautions Precautions: Back Precaution Comments: re-educated on BLT, pt cont to be reminded during function Required Braces or Orthoses: Spinal Brace Spinal Brace: Thoracolumbosacral orthotic, Applied in sitting position Restrictions Weight Bearing Restrictions: No   Therapy/Group: Individual Therapy  Kahlyn Shippey L 07/16/2018, 6:51 AM

## 2018-07-16 NOTE — Progress Notes (Signed)
Daily Progress Note   Patient Name: Brian [name]       Date: 07/16/2018 DOB: Oct 24, 1945  Age: 73 y.o. MRN#: [id number] Attending Physician: Jamse Arn, MD Primary Care Physician: [name], MD Admit Date: 06/29/2018  Reason for Consultation/Follow-up: psychosocial support.   Subjective: Patient is resting in bed. His wife and cousin are at bedside. He states he is feeling better today. He walked with rehab. This was encouraging to them. Possible return to the OR for further washout and debridement early next week.   Current Medications: Scheduled Meds:  . carvedilol  6.25 mg Oral BID WC  . clobetasol ointment   Topical BID  . diclofenac sodium  2 g Topical QID  . DULoxetine  60 mg Oral Daily  . feeding supplement (PRO-STAT SUGAR FREE 64)  30 mL Oral TID WC & HS  . fentaNYL  1 patch Transdermal Q72H  . furosemide  20 mg Oral BID  . gabapentin  300 mg Oral TID  . guaiFENesin  10 mL Oral Q6H  . HYDROcodone-acetaminophen  1 tablet Oral QHS  . magnesium oxide  200 mg Oral Daily  . methocarbamol  750 mg Oral QID  . pantoprazole  40 mg Oral Daily  . potassium chloride  30 mEq Oral BID  . senna-docusate  2 tablet Oral QPC supper    Continuous Infusions: .  ceFAZolin (ANCEF) IV 2 g (07/16/18 0505)    PRN Meds: camphor-menthol, diphenhydrAMINE, HYDROcodone-acetaminophen, HYDROmorphone HCl, menthol-cetylpyridinium **OR** phenol, polyethylene glycol, prochlorperazine **OR** prochlorperazine **OR** prochlorperazine, sodium chloride flush, sodium phosphate  Physical Exam Pulmonary:     Effort: Pulmonary effort is normal.  Skin:    General: Skin is warm and dry.             Vital Signs: BP 112/74 (BP Location: Left Arm)   Pulse 75   Temp 98.2 F (36.8 C) (Oral)    Resp 16   Ht 5\' 10"  (1.778 m)   Wt 87.9 kg   SpO2 95%   BMI 27.81 kg/m  SpO2: SpO2: 95 % O2 Device: O2 Device: Nasal Cannula O2 Flow Rate: O2 Flow Rate (L/min): 2 L/min  Intake/output summary:   Intake/Output Summary (Last 24 hours) at 07/16/2018 1342 Last data filed at 07/16/2018 1318 Gross per 24 hour  Intake 1317.85 ml  Output 1650 ml  Net -332.15 ml   LBM: Last BM Date: 07/15/18 Baseline Weight: Weight: 96.8 kg Most recent weight: Weight: 87.9 kg       Palliative Assessment/Data: 50%      Patient Active Problem List   Diagnosis Date Noted  . Pain   . Pain aggravated by physical activity   . Right hip pain   . Sleep disturbance   . Peripheral edema   . Leukocytosis   . Hyponatremia   . Sinus tachycardia   . Hypomagnesemia   . Orthostatic hypotension   . Labile blood pressure   . Cancer associated pain   . DVT of lower extremity (deep venous thrombosis) ([place])   . Malignant neoplasm of lung ([place])   . Hypoalbuminemia due to protein-calorie malnutrition ([place])   . Acute blood loss anemia   . Postoperative pain   . Pathological compression fracture of lumbar vertebra ([place]) 06/29/2018  . Metastatic lung cancer (metastasis from lung to other site) ([place]) 06/28/2018  . Rash 06/25/2018  . Hypokalemia 06/25/2018  . Normocytic anemia 06/25/2018  . S/P lumbar fusion 06/24/2018  . Palliative care by specialist   . Cancer related pain   . Lung cancer, primary, with metastasis from lung to other site, right ([place]) 06/14/2018  . Squamous cell lung cancer, right ([place]) 06/14/2018  . Lung cancer metastatic to bone ([place]) 06/14/2018  . Bronchitis due to tobacco use 06/07/2018  . Lumbar spine tumor 06/04/2018    Palliative Care Assessment & Plan   Recommendations/Plan:  Continuing rehab. He is feeling much better since starting Cymbalta.   Left clavicle with cancer pain. Agree with current Fentanyl patch for cancer related pain. Would not D/C prior to arrival. Would  continue for cancer pain.  Would recommend an appetite stimulant as most recent Albumin is 1.9 which is up from last week, but still low.     Code Status:    Code Status Orders  (From admission, onward)         Start     Ordered   06/29/18 1526  Full code  Continuous     06/29/18 1525        Code Status History    Date Active Date Inactive Code Status Order ID Comments User Context   06/11/2018 2031 06/29/2018 1440 Full Code [id number]  Milagros Loll, MD Inpatient   06/11/2018 2011 06/11/2018 2030 Full Code [id number]  Melina Schools, MD Inpatient   06/04/2018 1344 06/11/2018 2011 Full Code [id number]  Ardeen Jourdain, PA-C Inpatient       Prognosis:  Poor overall. Metastatic cancer with pathologic fracture diagnosed in December.   Discharge Planning:  To Be Determined  Care plan was discussed with RN  Thank you for allowing the Palliative Medicine Team to assist in the care of this patient.   Total Time 25 min Prolonged Time Billed no      Greater than 50%  of this time was spent counseling and coordinating care related to the above assessment and plan.  Asencion Gowda, NP  Please contact Palliative Medicine Team phone at [phone] for questions and concerns.

## 2018-07-16 NOTE — Plan of Care (Signed)
  Problem: Consults Goal: RH SPINAL CORD INJURY PATIENT EDUCATION Description  See Patient Education module for education specifics.  Outcome: Progressing Goal: Skin Care Protocol Initiated - if Braden Score 18 or less Description If consults are not indicated, leave blank or document N/A Outcome: Progressing   Problem: SCI BOWEL ELIMINATION Goal: RH STG MANAGE BOWEL WITH ASSISTANCE Description STG Manage Bowel with Mod.Assistance.  Outcome: Progressing Goal: RH STG SCI MANAGE BOWEL WITH MEDICATION WITH ASSISTANCE Description STG SCI Manage bowel with medication with Mod.assistance.  Outcome: Progressing Goal: RH STG MANAGE BOWEL W/EQUIPMENT W/ASSISTANCE Description STG Manage Bowel With Equipment With mod. Assistance  Outcome: Progressing Goal: RH STG SCI MANAGE BOWEL PROGRAM W/ASSIST OR AS APPROPRIATE Description STG SCI Manage bowel program w/mod.assist or as appropriate.  Outcome: Progressing   Problem: SCI BLADDER ELIMINATION Goal: RH STG MANAGE BLADDER WITH ASSISTANCE Description STG Manage Bladder With mod.Assistance  Outcome: Progressing   Problem: RH SKIN INTEGRITY Goal: RH STG SKIN FREE OF INFECTION/BREAKDOWN Description With mod. Assist.  Outcome: Progressing Goal: RH STG MAINTAIN SKIN INTEGRITY WITH ASSISTANCE Description STG Maintain Skin Integrity With Mod.Assistance.  Outcome: Progressing Goal: RH STG ABLE TO PERFORM INCISION/WOUND CARE W/ASSISTANCE Description STG Able To Perform Incision/Wound Care With Mod.Assistance.  Outcome: Progressing   Problem: RH SAFETY Goal: RH STG ADHERE TO SAFETY PRECAUTIONS W/ASSISTANCE/DEVICE Description STG Adhere to Safety Precautions With Mod.Assistance/Device.  Outcome: Progressing   Problem: RH PAIN MANAGEMENT Goal: RH STG PAIN MANAGED AT OR BELOW PT'S PAIN GOAL Description Less than 3,on 1 to 10 scale.  Outcome: Progressing

## 2018-07-16 NOTE — Progress Notes (Signed)
    Subjective: Procedure(s) (LRB): LUMBAR WOUND DEBRIDEMENT (N/A) 7 Days Post-Op  Patient reports pain as 4 on 0-10 scale.  Reports decreased leg pain reports incisional back pain   Positive void Positive bowel movement Positive flatus Negative chest pain or shortness of breath  Objective: Vital signs in last 24 hours: Temp:  [97.5 F (36.4 C)-98.2 F (36.8 C)] 98.2 F (36.8 C) (01/24 1308) Pulse Rate:  [62-75] 75 (01/24 1308) Resp:  [16-19] 16 (01/24 1308) BP: (93-112)/(52-74) 112/74 (01/24 1308) SpO2:  [95 %-100 %] 95 % (01/24 1308) Weight:  [87.9 kg] 87.9 kg (01/24 0513)  Intake/Output from previous day: 01/23 0701 - 01/24 0700 In: 1082.9 [P.O.:480; IV Piggyback:602.9] Out: 1450 [Urine:1450]  Labs: Recent Labs    07/16/18 0452  WBC 15.4*  RBC 2.94*  HCT 27.5*  PLT 455*   Recent Labs    07/14/18 0645 07/16/18 0452  NA 133* 135  K 4.0 4.3  CL 100 103  CO2 26 26  BUN 14 24*  CREATININE 0.63 0.59*  GLUCOSE 112* 132*  CALCIUM 8.6* 8.6*   No results for input(s): LABPT, INR in the last 72 hours.  Physical Exam: ABD soft Incision: scant drainage Body mass index is 27.81 kg/m.   Neuro.  Decreased neuropathic leg pain bilaterally.  Continues to have generalized lower extremity weakness.  Globally 4 out of 5 strength.  Negative nerve root tension signs in the lower extremity.  Negative Babinski test.  The significant right anterior thigh pain remains minimal.  Hematocrit remained stable.  No shortness of breath or chest pain.   Assessment/Plan: Patient stable  xrays n./a Continue mobilization with physical therapy Continue care  1.  Wound was evaluated.  The VAC is still in good position functioning well.  Patient's family had pictures of the wound taken today.  There is still fibrinous material that appears to be residual infection.  There is no significant purulent drainage.  2.  Although his white count remains elevated, he has had no fevers and  overall he is showing signs of improvement.  However at this point I do not think primary wound closure is appropriate.  3.  Will evaluate the wound directly on Monday with the care nurse.  If it still shows signs of fibrinous infectious material then more than likely I will take him back to the operating room for a repeat formal irrigation and debridement.  Overall I am confident that he will heal this wound without significant complications.  4.  Pleased with the fact that today he walked and was more alert and able to participate with therapy.  5.  No evidence of ongoing retroperitoneal hematoma as his hematocrit remained stable, having progressive right psoas pain.  6.  PICC line is in place and is tolerating IV antibiotic therapy.  Melina Schools, MD [facility] [phone]

## 2018-07-16 NOTE — Consult Note (Signed)
[facility] Nurse wound follow up Patient receiving care in 4M10.  His male cousin at the bedside. I removed one piece of black foam from the wound, and placed one piece of black foam into the wound.  The [facility] pad was bridged over to the left side.  Mepitel was used along the spine to ease removal. Wound type: Incision Measurement: Deferred Wound bed: 90% yellow, stringy slough Drainage (amount, consistency, odor) serosanginous.  Antibiotic beads in wound bed.  Periwound: Intact, normal color and texture skin Dressing procedure/placement/frequency: MWF VAC change by WOC Val Riles, RN, MSN, CWOCN, CNS-BC, pager [phone]

## 2018-07-17 ENCOUNTER — Inpatient Hospital Stay (HOSPITAL_COMMUNITY): Payer: Medicare Other | Admitting: Occupational Therapy

## 2018-07-17 ENCOUNTER — Inpatient Hospital Stay (HOSPITAL_COMMUNITY): Payer: Medicare Other | Admitting: Physical Therapy

## 2018-07-17 LAB — URINALYSIS, ROUTINE W REFLEX MICROSCOPIC
Bilirubin Urine: NEGATIVE
Glucose, UA: NEGATIVE mg/dL
Hgb urine dipstick: NEGATIVE
Ketones, ur: NEGATIVE mg/dL
Leukocytes, UA: NEGATIVE
Nitrite: NEGATIVE
PH: 7 (ref 5.0–8.0)
Protein, ur: NEGATIVE mg/dL
Specific Gravity, Urine: 1.02 (ref 1.005–1.030)

## 2018-07-17 NOTE — Progress Notes (Signed)
[place] PHYSICAL MEDICINE & REHABILITATION PROGRESS NOTE  Subjective/Complaints:  . Patient seen in therapy today.Marland Kitchen  He feels well.  No complaints.  Therapist reports that his O2 sats decreased with exertion and also has some decrease in blood pressure with standing but no symptoms of orthostasis.  Reviewed Dr. Rolena Infante' notes. Objective: Vital Signs: Blood pressure 91/80, pulse 88, temperature 97.8 F (36.6 C), temperature source Oral, resp. rate 12, height '5\' 10"'$  (1.778 m), weight 87.9 kg, SpO2 (!) 88 %.Physical Exam: BP 91/80   Pulse 88   Temp 97.8 F (36.6 C) (Oral)   Resp 12   Ht '5\' 10"'$  (1.778 m)   Wt 87.9 kg   SpO2 (!) 88% Comment: Following activity   BMI 27.81 kg/m   Elderly male in no acute distress.  He has a flat affect. HEENT exam atraumatic, normocephalic, extraocular muscles are intact. Neck is supple Chest clear to auscultation Cardiac exam S1 and S2 regular. Abdominal exam active bowel sounds, soft, nondistended. Neurologic exam he is alert. Assessment/Plan: 1. Functional deficits secondary to lung cancer with mets to ribs, vertebrae with oncological fracture status post lumbar fusion   Medical Problem List and Plan: 1.Functional and mobility deficitssecondary to metastatic lung cancer to left clavicle, ribs, L4 vertebral body with fracture. Pt is s/p L4 corpectomy and L3-5 fusion by Dr. Rolena Infante on 06/11/18  Continue CIR Consider treatment options after functional status is improved. 2. DVT: dopplers done this evening and patient has DVT in left CFV             IVC filter placement on 1/8  Pt not a candidate for anticoagulation due to hematoma and bleeding riskat present although long term he is high VTE risk 3. Pain Management: dilaudid prn. Reviewed pain medications.  Unable to tolerate oxycodone. 4. Mood:LCSW to follow for evaluation and support.  Currently on Cymbalta. 5. Neuropsych: This patientiscapable of making decisions on hisown  behalf. 6. Skin/Wound Care:  Per orthopedics.  Wound VAC in place. 7. Fluids/Electrolytes/Nutrition:Monitor I/O.  8.Blood pressure:  Discussed with physical therapist.  Continue abdominal binder as needed. Blood pressure ranging 91-113/69-80. 9. ABLA: Continue to monitor with serial checks.   Hemoglobin 8.1 on 1/24  Iron studies consistent with anemia of chronic disease: Ferritin levels high  IV iron 10. Hypokalemia:   Resolved. 11. Rash: Clobetasol and hypoallergenic sheets. 12. Neuropathic pain: Improving on gabapentin 300 mg tid 13. Constipation:Managed with miralax prn--patient wants to manage his bowel program. 14.  Hypoalbuminemia  Supplement initiated on 1/8 15.  Sinus tachycardia  Metoprolol 12.5 twice daily started on 1/13, changed to Coreg on 1/15  Improved 16.  Hyponatremia resolved. 17.  Wound infection  WBCs 15.4 on 1/24  Wound cultures with E. coli, pansensitive  Chest x-ray reviewed with patient, unremarkable for acute infectious process, repeat chest x-ray on 1/16 reviewed showing some lower abnormality-continue to monitor  Afebrile  Appreciate ID recs, notes reviewed- IV ceftezole   Antibiotic beads placed by Ortho in wound  Blood cultures no growth  ESR/CRP elevated on 1/22  Back incision as above 18. Peripheral edema  Lasix 20 daily started on 1/15, increased to twice daily on 1/21 local care and elevation   Improving 19.  Sleep disturbance  Trazodone DC'd, Benadryl ordered for pruritus and sleep on 1/17  Improving overall. 20.  Pruritus.  Secondary to pain medications  Benadryl as needed, dose increased on 1/17  Improving 21.  Multiple joint pain  No acute problems identified.  LOS: 18  days A FACE TO FACE EVALUATION WAS PERFORMED  Brian [name] H Pawan Knechtel 07/17/2018, 10:44 AM

## 2018-07-17 NOTE — Progress Notes (Signed)
Occupational Therapy Session Note  Patient Details  Name: Gillis Boardley MRN: [id number] Date of Birth: 1946/05/19  Today's Date: 07/17/2018 OT Individual Time: 1333-1430 OT Individual Time Calculation (min): 57 min    Short Term Goals: Week 3:  OT Short Term Goal 1 (Week 3): Pt will consistently complete sliding board transfer with min A in order to reduce caregiver burden OT Short Term Goal 2 (Week 3): Pt will tolerate completing UB bathing/dressing from EOB with supervision/set-up OT Short Term Goal 3 (Week 3): Pt will complete bed mobility at supervision level during bed level ADLs in order to reduce caregiver burden OT Short Term Goal 4 (Week 3): Pt will tolerate sitting OOB for>2 hours in order to increase functional activity tolerance.  Skilled Therapeutic Interventions/Progress Updates:    Pt completed supine to sit EOB with mod assist to start session.  BP taken in sitting at 129/56.  Therapist donned abdominal binder and TLSO while sitting.  Pt then completed transfer stand pivot to the wheelchair with use of the RW and mod assist from elevated bed.  Oxygen sats 92% or greater on 2 Ls nasal cannula when sitting in the wheelchair.  Had pt transition to the therapy gym with therapist pushing the wheelchair.  Once in the gym, attempted sit to stand from the wheelchair but pt unable to achieve.  Utilized sliding board with mod assist for transfer over to the mat.  Once on the mat concentrated session on sit to stand transitions from elevated surface as well as standing balance.  He was able to complete sit to stand from elevated mat with min to mod assist.  He was also able to tolerate standing for up to approximately two minutes before needing to rest.  While standing, had pt complete marching in place as well as one set of ten mini squats for strengthening his LEs.  Finished therapy with functional mobility from the mat approximately 23 feet with min assist using the RW.  Pt transferred  back to the room with his spouse and daughter and pt left up in the wheelchair to rest.  Call button and phone in reach as well.  Therapy Documentation Precautions:  Precautions Precautions: Back Precaution Comments: re-educated on BLT, pt cont to be reminded during function Required Braces or Orthoses: Spinal Brace Spinal Brace: Thoracolumbosacral orthotic, Applied in sitting position Restrictions Weight Bearing Restrictions: No  Pain: Pain Assessment Pain Scale: Faces Faces Pain Scale: Hurts little more Pain Type: Surgical pain Pain Location: Shoulder Pain Orientation: Right Pain Descriptors / Indicators: Discomfort Pain Onset: With Activity Pain Intervention(s): Emotional support;Repositioned ADL: See Care Tool Section for details  Therapy/Group: Individual Therapy  Takeisha Cianci OTR/L 07/17/2018, 3:49 PM

## 2018-07-17 NOTE — Progress Notes (Signed)
Physical Therapy Session Note  Patient Details  Name: Brian [name] MRN: [id number] Date of Birth: 11-28-1945  Today's Date: 07/17/2018 PT Individual Time: 0800-0920 PT Individual Time Calculation (min): 80 min   Short Term Goals: Week 3:  PT Short Term Goal 1 (Week 3): pt will move supine> sit with min assist PT Short Term Goal 2 (Week 3): pt will perform slide board trasnfer with min assist 50% of trials PT Short Term Goal 3 (Week 3): pt will perform gait x 10' with LRAD PT Short Term Goal 4 (Week 3): pt will state 3/3 spinal precautions  Skilled Therapeutic Interventions/Progress Updates:   Pt received sitting EOB and agreeable to PT.   Sitting balacne EOB x 10 minutes for clothing management and pain medicine administration. SB transfer to [facility] with min assist and moderate cues for proper UE placement and sequencing of LE and UE.   O2 assessment with and without supplemental O2.  At rest on room air. 93%.  With activity of WC propulsion x 75 ft on room air 79% no increase with rest initially.  At rest on 2 L via nasal canula 92% in 30sec and 94% in 1 minute With acvitity of WC propulsion x 75 ft 88% on 2 L via nasal canual Increased to 96% at rest on 2 L following WC propulsion   Attempted sit<>stand in parallel bars. Reports significant dizziness. Unable to stand from [facility] on additional attempted. SB transfer to mat table with min assist overall. Orthostatic vitals assessed. 112/61 in sitting. 88/58 in standing with significant s/s. Return to sitting 110/64. SB transfer to the R to return to [facility] with min assist and moderate cues for safety.   Patient returned to room and left sitting in [facility] with call bell in reach and all needs met.         Therapy Documentation Precautions:  Precautions Precautions: Back Precaution Comments: re-educated on BLT, pt cont to be reminded during function Required Braces or Orthoses: Spinal Brace Spinal Brace: Thoracolumbosacral orthotic,  Applied in sitting position Restrictions Weight Bearing Restrictions: No    Vital Signs: Therapy Vitals Pulse Rate: 88 Resp: 12 BP: 91/80 Patient Position (if appropriate): Orthostatic Vitals Oxygen Therapy SpO2: (!) 88 %(Following activity ) O2 Device: Nasal Cannula O2 Flow Rate (L/min): 2 L/min Pain: Pain Assessment Pain Scale: 0-10 Pain Score: 5  Pain Type: Surgical pain Pain Location: Back Pain Orientation: Lower Pain Descriptors / Indicators: Aching Pain Frequency: Constant Pain Onset: On-going Pain Intervention(s): Medication (See eMAR)    Therapy/Group: Individual Therapy  [name] 07/17/2018, 9:26 AM

## 2018-07-18 ENCOUNTER — Inpatient Hospital Stay (HOSPITAL_COMMUNITY): Payer: Medicare Other | Admitting: Physical Therapy

## 2018-07-18 ENCOUNTER — Inpatient Hospital Stay (HOSPITAL_COMMUNITY): Payer: Medicare Other | Admitting: Occupational Therapy

## 2018-07-18 LAB — URINE CULTURE: Culture: NO GROWTH

## 2018-07-18 NOTE — Progress Notes (Signed)
[place] PHYSICAL MEDICINE & REHABILITATION PROGRESS NOTE  Subjective/Complaints:  .  Patient feeling well today.  His daughter is with him today.  He is lying in bed.  He denies pain.  He is sleeping well.  He is eating well. Objective:  Physical Exam: BP 114/71 (BP Location: Left Arm)   Pulse 86   Temp 98.6 F (37 C) (Oral)   Resp 14   Ht 5' 10" (1.778 m)   Wt 87.8 kg   SpO2 97%   BMI 27.77 kg/m   Elderly male in no acute distress.  HEENT exam atraumatic, normocephalic, extraocular muscles are intact. Neck is supple Chest clear to auscultation Cardiac exam S1 and S2 regular. Abdominal exam active bowel sounds, soft, nondistended. Neurologic exam he is alert. Assessment/Plan: 1. Functional deficits secondary to lung cancer with mets to ribs, vertebrae with oncological fracture status post lumbar fusion   Medical Problem List and Plan: 1.Functional and mobility deficitssecondary to metastatic lung cancer to left clavicle, ribs, L4 vertebral body with fracture. Pt is s/p L4 corpectomy and L3-5 fusion by Dr. Rolena Infante on 06/11/18  Continue CIR Consider treatment options after functional status is improved. 2. DVT: dopplers done  patient has DVT in left CFV             IVC filter placement on 1/8  Pt not a candidate for anticoagulation due to hematoma and bleeding riskat present although long term he is high VTE risk 3. Pain Management:He denies significant discomfort.  Continue current regimen. 4. Mood:LCSW to follow for evaluation and support.  Currently on Cymbalta. 5. Neuropsych: This patientiscapable of making decisions on hisown behalf. 6. Skin/Wound Care:  Per orthopedics.  Wound VAC in place. 7. Fluids/Electrolytes/Nutrition:Monitor I/O.  8.Blood pressure:  Discussed with physical therapist.  Continue abdominal binder as needed. Blood pressure ranging 91-113/69-80. 9. ABLA: Continue to monitor with serial checks.   Hemoglobin 8.1 on 1/24  Iron  studies consistent with anemia of chronic disease: Ferritin levels high  IV iron 10. Hypokalemia:   Resolved. 11. Rash: Improved.  He thinks Benadryl helped significantly. 12. Neuropathic pain: Improving on gabapentin 300 mg tid 13. Constipation:Managed with miralax prn--patient wants to manage his bowel program. 14.  Hypoalbuminemia  Supplement initiated on 1/8 15.  Sinus tachycardia  Metoprolol 12.5 twice daily started on 1/13, changed to Coreg on 1/15  Improved 16.  Hyponatremia resolved. 17.  Wound infection  WBCs 15.4 on 1/24  Wound cultures with E. coli, pansensitive  Chest x-ray reviewed with patient, unremarkable for acute infectious process, repeat chest x-ray on 1/16 reviewed showing some lower abnormality-continue to monitor  Afebrile  Appreciate ID recs, notes reviewed- IV ceftezole   Antibiotic beads placed by Ortho in wound  Blood cultures no growth  ESR/CRP elevated on 1/22  Back incision as above 18. Peripheral edema  Lasix 20 daily started on 1/15, increased to twice daily on 1/21 local care and elevation   Improving 19.  Sleep disturbance-improved.  Trazodone DC'd, Benadryl ordered for pruritus and sleep on 1/17  . 20.  Pruritus.-Improved.   21.  Multiple joint pain-improved.   LOS: 19 days A FACE TO FACE EVALUATION WAS PERFORMED  Sahirah Rudell H Deisy Ozbun 07/18/2018, 9:08 AM

## 2018-07-18 NOTE — Progress Notes (Signed)
Physical Therapy Session Note  Patient Details  Name: Brian [name] MRN: [id number] Date of Birth: 03/27/1946  Today's Date: 07/18/2018 PT Individual Time: 1300-1355 PT Individual Time Calculation (min): 55 min   Short Term Goals: Week 1:  PT Short Term Goal 1 (Week 1): pt will move supine> sit with supervision PT Short Term Goal 1 - Progress (Week 1): Not met PT Short Term Goal 2 (Week 1): pt will perform basic transfer with +1 consistently PT Short Term Goal 2 - Progress (Week 1): Progressing toward goal PT Short Term Goal 3 (Week 1): pt will propel w/c x 100' PT Short Term Goal 3 - Progress (Week 1): Met PT Short Term Goal 4 (Week 1): pt will perform gait with LRAD x 35' with min assist PT Short Term Goal 4 - Progress (Week 1): Met PT Short Term Goal 5 (Week 1): pt will demonstrate knowledge of back precautions when moving in/out of bed wiht 0-1 cue PT Short Term Goal 5 - Progress (Week 1): Not met  Skilled Therapeutic Interventions/Progress Updates:  Pt was seen bedside in the pm. Pt rolled R/L with siderails and min A. Pt transferred supine to edge of bed with mod A and verbal cues. Donned abd binder and TLSO at edge of bed. Pt transferred edge of bed to w/c with sliding board and mod A with second person standby. In gym treatment focused on LE strengthening and standing. Performed B LEs exercises, hip flex and LAQs, 3 sets x 10 reps each. Pt transferred sit to stand in parallel bars with mod A and verbal cues. Pt returned to room. Pt transferred w/c to edge of bed with min A and sliding board. Doffed TLSO and abd binder at edge of bed. Pt transferred edge of bed to supine with mod A. Pt left sitting up in bed with nurse at bedside.   Therapy Documentation Precautions:  Precautions Precautions: Back Precaution Comments: re-educated on BLT, pt cont to be reminded during function Required Braces or Orthoses: Spinal Brace Spinal Brace: Thoracolumbosacral orthotic, Applied in  sitting position Restrictions Weight Bearing Restrictions: No General:   Pain: No c/o pain.    Therapy/Group: Individual Therapy  Dub Amis 07/18/2018, 3:34 PM

## 2018-07-18 NOTE — Progress Notes (Signed)
Occupational Therapy Session Note  Patient Details  Name: Brian [name] MRN: [id number] Date of Birth: 14-Oct-1945  Today's Date: 07/18/2018 OT Individual Time: 1749-4496 OT Individual Time Calculation (min): 75 min    Short Term Goals: Week 3:  OT Short Term Goal 1 (Week 3): Pt will consistently complete sliding board transfer with min A in order to reduce caregiver burden OT Short Term Goal 2 (Week 3): Pt will tolerate completing UB bathing/dressing from EOB with supervision/set-up OT Short Term Goal 3 (Week 3): Pt will complete bed mobility at supervision level during bed level ADLs in order to reduce caregiver burden OT Short Term Goal 4 (Week 3): Pt will tolerate sitting OOB for>2 hours in order to increase functional activity tolerance.  Skilled Therapeutic Interventions/Progress Updates:    Pt seen for OT session focusing on functional transfers, general strengthening and endurance and initiating family education. Pt in supine upon arrival with wife and daughter present. Pt voicing pain as "more than usual", current with all pain medications and repositioned throughout session for comfort. He transferred to sitting EOB, min A overall using hospital bed functions with assist to manage LEs off EOB as shoes already donned. TLSO donned EOB total A by therapist.  Plan to have pt complete stand pivot transfer to [facility], however, despite several attempts to stand from highly elevated EOB, pt unable to come into full standing position. Therefore task stopped as pt would have to complete multiple sit>stands after he got to [facility] if successful.  He was able to come into partial stand, clearing buttock enough for therapist to pull pants up. He completed sliding board transfer into w/c with CGA going downwards on slideboard with VCs for hand placement and head/hip relationship. Pt self propelled w/c ~67ft for UE strengthening and general endurance.  In therapy gym, pt and wife worked through  establishing proper set-up of w/c in prep for sliding board transfer, min cuing from therapist. [name] assisted with placing sliding board and providing CGA for transfer to EOM. Seated EOM completed LE strengthening exercises x10 heel slides on towel, x10 LARQ and x10 knee raises with increased time/ rest breaks provided throughout and multi-modal cuing to prevent compensatory strategies. Kicked soccer ball seated EOM using B LEs. UE strengthening using #3 dowel rod to hit ball back and forth with therapist.  Pt then voicing increased fatigue and requesting to return to w/c. He was able to direct daughter with set-up of equipment in prep for transfer with mod cuing from wife and therapist to recall all parts of set-up. Wife assisted with min A slide board transfer back to w/c. Pt returned to room, left seated in w/c with all needs in reach and family present. [name] pulled therapist aside after session and requested that pt's daughter not be apart of hands on training as she will not be apart of care picture at d/c. Will make rest of tx team aware.   Therapy Documentation Precautions:  Precautions Precautions: Back Precaution Comments: re-educated on BLT, pt cont to be reminded during function Required Braces or Orthoses: Spinal Brace Spinal Brace: Thoracolumbosacral orthotic, Applied in sitting position Restrictions Weight Bearing Restrictions: No   Therapy/Group: Individual Therapy  Autie Vasudevan L 07/18/2018, 6:38 AM

## 2018-07-19 ENCOUNTER — Inpatient Hospital Stay (HOSPITAL_COMMUNITY): Payer: Medicare Other | Admitting: Occupational Therapy

## 2018-07-19 ENCOUNTER — Inpatient Hospital Stay (HOSPITAL_COMMUNITY): Payer: Medicare Other | Admitting: Physical Therapy

## 2018-07-19 LAB — CBC WITH DIFFERENTIAL/PLATELET
Abs Immature Granulocytes: 0.5 10*3/uL — ABNORMAL HIGH (ref 0.00–0.07)
Basophils Absolute: 0.1 10*3/uL (ref 0.0–0.1)
Basophils Relative: 0 %
Eosinophils Absolute: 0.4 10*3/uL (ref 0.0–0.5)
Eosinophils Relative: 3 %
HCT: 31 % — ABNORMAL LOW (ref 39.0–52.0)
Hemoglobin: 9 g/dL — ABNORMAL LOW (ref 13.0–17.0)
Immature Granulocytes: 3 %
Lymphocytes Relative: 11 %
Lymphs Abs: 1.8 10*3/uL (ref 0.7–4.0)
MCH: 26.8 pg (ref 26.0–34.0)
MCHC: 29 g/dL — ABNORMAL LOW (ref 30.0–36.0)
MCV: 92.3 fL (ref 80.0–100.0)
Monocytes Absolute: 1.4 10*3/uL — ABNORMAL HIGH (ref 0.1–1.0)
Monocytes Relative: 8 %
Neutro Abs: 12.1 10*3/uL — ABNORMAL HIGH (ref 1.7–7.7)
Neutrophils Relative %: 75 %
Platelets: 364 10*3/uL (ref 150–400)
RBC: 3.36 MIL/uL — ABNORMAL LOW (ref 4.22–5.81)
RDW: 16 % — ABNORMAL HIGH (ref 11.5–15.5)
WBC: 16.2 10*3/uL — ABNORMAL HIGH (ref 4.0–10.5)
nRBC: 0 % (ref 0.0–0.2)

## 2018-07-19 MED ORDER — SODIUM CHLORIDE 0.9 % IV SOLN
INTRAVENOUS | Status: DC | PRN
  Administered 2018-07-19 – 2018-07-25 (×3): via INTRAVENOUS

## 2018-07-19 MED ORDER — HYDROCERIN EX CREA
TOPICAL_CREAM | Freq: Two times a day (BID) | CUTANEOUS | Status: DC
  Administered 2018-07-19 – 2018-07-30 (×11): via TOPICAL
  Filled 2018-07-19 (×2): qty 113

## 2018-07-19 MED ORDER — ZINC SULFATE 220 (50 ZN) MG PO CAPS
220.0000 mg | ORAL_CAPSULE | Freq: Two times a day (BID) | ORAL | Status: DC
  Administered 2018-07-19 – 2018-07-25 (×8): 220 mg via ORAL
  Filled 2018-07-19 (×15): qty 1

## 2018-07-19 MED ORDER — TAMSULOSIN HCL 0.4 MG PO CAPS
0.4000 mg | ORAL_CAPSULE | Freq: Every day | ORAL | Status: DC
  Administered 2018-07-19 – 2018-07-29 (×8): 0.4 mg via ORAL
  Filled 2018-07-19 (×10): qty 1

## 2018-07-19 MED ORDER — VITAMIN C 500 MG PO TABS
500.0000 mg | ORAL_TABLET | Freq: Two times a day (BID) | ORAL | Status: DC
  Administered 2018-07-19 – 2018-07-25 (×8): 500 mg via ORAL
  Filled 2018-07-19 (×15): qty 1

## 2018-07-19 NOTE — Consult Note (Addendum)
[place] Nurse wound consult note Dr Rolena Infante at the bedside to assess wound appwarance during Vac dresssing change. Measurement: 5X2X1cm with .5 cm undermining to wound edges Wound bed: 20% red, 80% yellow fibrinous, antibiotic beads in the wound bed Drainage (amount, consistency, odor) mod amt pink drainage in the cannister, no odor Periwound: intact skin surrounding, sutures to upper wound are pulling appear, .2X.2X.1cm yellow moist wound bed near upper incision line Dressing procedure/placement/frequency: Pt medicated for pain prior to procedure.  Family members at the bedside to assess wound appearance during dressing change. Applied Mepitel contact layer to incision and one piece black foam to open wound, then bridged track pad to the flank to reduce pressure over the back. Cont suction on at 110mm. Pt tolerated with mod amt discomfort.  Pt plans to have surgery on Wed, WOC will await further orders after that date. Please re-consult if further assistance is needed.  Thank-you,  Julien Girt MSN, [place], [place], [place], [place]

## 2018-07-19 NOTE — Progress Notes (Signed)
[place] PHYSICAL MEDICINE & REHABILITATION PROGRESS NOTE  Subjective/Complaints: Patient seen laying in bed this morning.  Daughter at bedside.  Patient states he slept well overnight.  He states he had a good weekend.  Ortho to evaluate wound today.  Daughter complains of urinary retention.  ROS: Denies CP, shortness of breath, nausea, vomiting, diarrhea.  Objective: Vital Signs: Blood pressure 123/77, pulse 88, temperature 98.9 F (37.2 C), temperature source Oral, resp. rate 18, height '5\' 10"'$  (1.778 m), weight 88.4 kg, SpO2 95 %. No results found. Recent Labs    07/19/18 0459  WBC 16.2*  HGB 9.0*  HCT 31.0*  PLT 364   No results for input(s): NA, K, CL, CO2, GLUCOSE, BUN, CREATININE, CALCIUM in the last 72 hours.  Physical Exam: BP 123/77   Pulse 88   Temp 98.9 F (37.2 C) (Oral)   Resp 18   Ht '5\' 10"'$  (1.778 m)   Wt 88.4 kg   SpO2 95%   BMI 27.96 kg/m  Constitutional: No distress . Vital signs reviewed. HENT: Normocephalic.  Atraumatic. Eyes: EOMI. No discharge. Cardiovascular: RRR.  No JVD. Respiratory: CTA bilaterally.  Normal effort.  + [place] GI: BS +. Non-distended. Musculoskeletal: Bilateral lower extremity edema, left greater than right, stable Neurological: He is alert and oriented  Dysphonia Able to follow basic commands without difficulty.  Motor:  Right lower extremity: 3+/5 proximal distal, pain inhibition Left lower extremity: Hip flexion 3-/5, knee extension 4-/5, ankle dorsiflexion 4/5, some pain inhibition, stable Skin: Warm and dry. Intact.  Back incision with VAC Psychiatric: Flat  Assessment/Plan: 1. Functional deficits secondary to lung cancer with mets to ribs, vertebrae with oncological fracture status post lumbar fusion which require 3+ hours per day of interdisciplinary therapy in a comprehensive inpatient rehab setting.  Physiatrist is providing close team supervision and 24 hour management of active medical problems listed  below.  Physiatrist and rehab team continue to assess barriers to discharge/monitor patient progress toward functional and medical goals  Care Tool:  Bathing  Bathing activity did not occur: Safety/medical concerns(Unable to assess 2/2 drop in BP with upright positioning) Body parts bathed by patient: Right arm, Left arm, Chest, Abdomen, Front perineal area, Right upper leg, Left upper leg, Face   Body parts bathed by helper: Right lower leg, Left lower leg, Buttocks     Bathing assist Assist Level: Moderate Assistance - Patient 50 - 74%     Upper Body Dressing/Undressing Upper body dressing Upper body dressing/undressing activity did not occur (including orthotics): Safety/medical concerns(B/P issues ) What is the patient wearing?: Orthosis Orthosis activity level: Performed by helper  Upper body assist Assist Level: Total Assistance - Patient < 25%    Lower Body Dressing/Undressing Lower body dressing      What is the patient wearing?: Pants     Lower body assist Assist for lower body dressing: 2 Helpers     Toileting Toileting Toileting Activity did not occur (Clothing management and hygiene only): Safety/medical concerns(Unable to assess 2/2 drop in BP with upright positioning)  Toileting assist Assist for toileting: 2 Helpers     Transfers Chair/bed transfer  Transfers assist     Chair/bed transfer assist level: Minimal Assistance - Patient > 75%     Locomotion Ambulation   Ambulation assist   Ambulation activity did not occur: Safety/medical concerns  Assist level: Minimal Assistance - Patient > 75% Assistive device: Walker-rolling Max distance: 23'   Walk 10 feet activity   Assist  Assist level: Moderate Assistance - Patient - 50 - 74% Assistive device: Walker-rolling   Walk 50 feet activity   Assist           Walk 150 feet activity   Assist           Walk 10 feet on uneven surface  activity   Assist            Wheelchair     Assist Will patient use wheelchair at discharge?: Yes Type of Wheelchair: Manual Wheelchair activity did not occur: Safety/medical concerns(hypotensive)  Wheelchair assist level: Supervision/Verbal cueing Max wheelchair distance: 189f    Wheelchair 50 feet with 2 turns activity    Assist    Wheelchair 50 feet with 2 turns activity did not occur: Safety/medical concerns   Assist Level: Supervision/Verbal cueing   Wheelchair 150 feet activity     Assist Wheelchair 150 feet activity did not occur: Safety/medical concerns   Assist Level: Dependent - Patient 0%      Medical Problem List and Plan: 1.Functional and mobility deficitssecondary to metastatic lung cancer to left clavicle, ribs, L4 vertebral body with fracture. Pt is s/p L4 corpectomy and L3-5 fusion by Dr. BRolena Infanteon 06/11/18  Continue CIR  Per hematology/oncology, awaiting improvement in function and strength prior to chemotherapy.  Rad-onc involved.  Poor prognosis.  Status post I&D of back wound 1/17 - plan to close wound next week 2. DVT: dopplers done this evening and patient has DVT in left CFV             IVC filter placement on 1/8  Pt not a candidate for anticoagulation due to hematoma and bleeding riskat present although long term he is high VTE risk 3. Pain Management: dilaudid prn. Unable to tolerate Oxycodone   Fentanyl increased to 25 mcg on 1/17, increased to 50 on 1/21  Appreciate palliative care consult- notes reviewed  Cymbalta added on 1/17, increased on 1/24  Right subacromial and lumbar paraspinal trigger point injected by Ortho on 1/22-notes reviewed  Voltaren gel added on 1/23 with improvement  Relatively controlled on 1/27 4. Mood:LCSW to follow for evaluation and support.  Cymbalta added  Patient more calm when daughter not present 5. Neuropsych: This patientiscapable of making decisions on hisown behalf. 6. Skin/Wound Care:  VAC in place on back  wound per ortho, discussed with WOC, suboptimal healing, Ortho to evaluate today 7. Fluids/Electrolytes/Nutrition:Monitor I/O.  8.Blood pressure:  Abdominal binder as needed  Relatively controlled on 1/27, orthostatics negative  Monitor with increased mobility 9. ABLA: Continue to monitor with serial checks.   Hemoglobin 9.0 on 1/27  Iron studies consistent with anemia of chronic disease: Ferritin levels high  IV iron 10. Hypokalemia:   Potassium 4.3 on 1/24   Supplement increased on 1/13  Magnesium 2.2 on 1/24   Supplement initiated on 1/14 11. Rash: Continue Clobetasol bidas well as hypoallergenic sheets. 12. Neuropathic pain: Improving on gabapentin 300 mg tid 13. Constipation:Managed with miralax prn--patient wants to manage his bowel program. 14.  Hypoalbuminemia  Supplement initiated on 1/8 15.  Sinus tachycardia  Metoprolol 12.5 twice daily started on 1/13, changed to Coreg on 1/15  Improved 16.  Hyponatremia  Sodium 135 on 1/24  Continue to monitor 17.  Wound infection  WBCs 16.2 on 1/27, trending up, await ID/Ortho recs  Wound cultures with E. coli, pansensitive  Chest x-ray reviewed with patient, unremarkable for acute infectious process, repeat chest x-ray on 1/16 reviewed showing some lower abnormality-continue to  monitor  Afebrile  Appreciate ID recs, notes reviewed- IV ceftezole   Antibiotic beads placed by Ortho in wound  Blood cultures no growth  ESR/CRP elevated on 1/22  Back incision as above 18. Peripheral edema  Lasix 20 daily started on 1/15, increased to twice daily on 1/21 local care and elevation   Improving 19.  Sleep disturbance  Trazodone DC'd, Benadryl ordered for pruritus and sleep on 1/17  Improving. 20.  Pruritus.  Secondary to pain medications  Benadryl as needed, dose increased on 1/17  Improving 21.  Multiple joint pain  Multifactorial  Hip x-ray with degenerative changes  Right shoulder xray reviewed - degenerative changes with  ?atypical area of lucency  LOS: 20 days A FACE TO FACE EVALUATION WAS PERFORMED  Laporchia Nakajima [name] 07/19/2018, 9:42 AM

## 2018-07-19 NOTE — Progress Notes (Signed)
Occupational Therapy Session Note  Patient Details  Name: Brian [name] MRN: [id number] Date of Birth: 12/17/1945  Today's Date: 07/19/2018 OT Individual Time: 1015-1106 OT Individual Time Calculation (min): 51 min  OT Missed Time: 24 Minutes (nursing care/pt refusal)   Short Term Goals: Week 3:  OT Short Term Goal 1 (Week 3): Pt will consistently complete sliding board transfer with min A in order to reduce caregiver burden OT Short Term Goal 2 (Week 3): Pt will tolerate completing UB bathing/dressing from EOB with supervision/set-up OT Short Term Goal 3 (Week 3): Pt will complete bed mobility at supervision level during bed level ADLs in order to reduce caregiver burden OT Short Term Goal 4 (Week 3): Pt will tolerate sitting OOB for>2 hours in order to increase functional activity tolerance.  Skilled Therapeutic Interventions/Progress Updates:    Therapist arrived at 0945 for scheduled OT session. Pt having just had incontinent BM and NT present assisting with clean-up. Therapist offered to have hand off from NT and cont with family education. Pt refusing to have therapist assist with this and pt's wife declining family ed at this time, stating "it's just not a good time right now for that".  Pt missed 24 minutes of skilled session as result. Nursing completed care at 10:15 and therapist began scheduled session. Pt voiced 8/10 pain in lower back following bed mobility, opted to have [place] pain medicine, RN made ware and administed at beginning of session.  He Required significantly increased time and rest breaks when completing back mobility this session including transfer to EOB with mod-max A overall using hospital bed functions. [name] assisted with donning abdominal binder and TLSO as part of caregiver education. He completed min A sliding board transfer to w/c with wife providing assist. VCs from therapist to wife regarding proper body mechanics of caregiver and head/hip  relationship and hand placement for pt during transfer. Pt taken to therapy gym total A in w/c for time and energy conservation. Cont with caregiver training. [name] setting up w/c in prep for sliding board transfer, required min cuing from therapist as pt unable to identify her error in not removing armrest from w/c in prep for transfer. [name] assisted with slide board w/c<>EOM in same manner as described above. Education and demonstration for how to provide more than CGA if needed for sliding board transfer and importance of proper body mechanics. Pt sat EOM ~10 minutes with supervision. While seated EOM, discussion btwn therapist wife and pt regarding vehicle use for d/c. [name] has SUV which is a 9" incline from pt's w/c. Do not believe pt will be at level to consistently complete stand pivot transfers at d/c and 9" incline too great for sliding board. [name] looking into alternative vehicle methods. Pt returned to room at end of session, encouraged to stay sitting up in w/c. Left with all needs in reach.   Therapy Documentation Precautions:  Precautions Precautions: Back Precaution Comments: re-educated on BLT, pt cont to be reminded during function Required Braces or Orthoses: Spinal Brace Spinal Brace: Thoracolumbosacral orthotic, Applied in sitting position Restrictions Weight Bearing Restrictions: No   Therapy/Group: Individual Therapy  Hiliary Osorto L 07/19/2018, 7:07 AM

## 2018-07-19 NOTE — Progress Notes (Signed)
Physical Therapy Session Note  Patient Details  Name: Brian [name] MRN: [id number] Date of Birth: 05/10/1946  Today's Date: 07/19/2018 PT Individual Time: 1300-1345 PT Individual Time Calculation (min): 45 min   Short Term Goals: Week 3:  PT Short Term Goal 1 (Week 3): pt will move supine> sit with min assist PT Short Term Goal 2 (Week 3): pt will perform slide board trasnfer with min assist 50% of trials PT Short Term Goal 3 (Week 3): pt will perform gait x 10' with LRAD PT Short Term Goal 4 (Week 3): pt will state 3/3 spinal precautions  Skilled Therapeutic Interventions/Progress Updates:    Patient received in bed asleep, easily woken and willing to participate in PT session today. Continues to require MinA for rolling, and ModA for sidelying to sit, abdominal binder and spinal brace donned totalA at EOB. Able to transfer from bed to [facility] with MinA via sliding board, cues for SB placement and safety with transfer, then was transported to PT gym totalA in [facility] and performed sliding board transfer from [facility] to mat table with MinA as well. Attempted sit to stand from elevated table with totalAx2, unable to perform due to shoulder pain and poor initiation with transfer, patient declined attempting standing again due to shoulder pain. Performed lateral trunk leans at edge of mat table for increased core/UE strength and dynamic sitting balance, then transferred back to [facility] with sliding board and MinA. Finally, worked on B LE strength and endurance by pulling WC forward approximately 11f using hamstrings, and pushing WC backwards approximately 570fusing quads with PT assisting in steering chair backwards. [facility] left up in chair with family providing direct supervision, all needs otherwise met.   Therapy Documentation Precautions:  Precautions Precautions: Back Precaution Comments: re-educated on BLT, pt cont to be reminded during function Required Braces  or Orthoses: Spinal Brace Spinal Brace: Thoracolumbosacral orthotic, Applied in sitting position Restrictions Weight Bearing Restrictions: No General:   Vital Signs:   Pain: Pain Assessment Pain Scale: 0-10 Pain Score: 8  Pain Type: Acute pain Pain Location: Back Pain Orientation: Right Pain Descriptors / Indicators: Aching;Sore Pain Frequency: Constant Pain Onset: On-going Patients Stated Pain Goal: 0 Pain Intervention(s): Medication (See eMAR);RN made aware Multiple Pain Sites: No    Therapy/Group: Individual Therapy  KrDeniece ReeT, DPT, CBIS  Supplemental Physical Therapist [facility]  Pager [phone] Rehab Office [phone] 07/19/2018, 2:05 PM

## 2018-07-19 NOTE — Progress Notes (Signed)
Physical Therapy Session Note  Patient Details  Name: Brian [name] MRN: [id number] Date of Birth: April 22, 1946  Today's Date: 07/19/2018 PT Individual Time: 1130-1153 PT Individual Time Calculation (min): 23 min   Short Term Goals: Week 3:  PT Short Term Goal 1 (Week 3): pt will move supine> sit with min assist PT Short Term Goal 2 (Week 3): pt will perform slide board trasnfer with min assist 50% of trials PT Short Term Goal 3 (Week 3): pt will perform gait x 10' with LRAD PT Short Term Goal 4 (Week 3): pt will state 3/3 spinal precautions  Skilled Therapeutic Interventions/Progress Updates:    Patient received up in [facility], wife and daughter present; educated on PT plan of staying in room as wound care nurse/surgeon are coming soon to address/replace wound vac. Attempted sit to stand with totalAx2, unable to achieve standing position this morning. Then performed sliding board transfer to the left with MinA, doffed brace with totalA, and performed sit to sidelying with ModA and rolling to back with MinA. Otherwise worked on strengthening exercises for glutes and hip flexors, then required totalAx2 to scoot up in bed and at this point patient had to use urinal/asked PT to leave room, wound care team had also arrived and session was cut a bit short due to this. He was left in bed with family and RN present, all needs otherwise met.   Therapy Documentation Precautions:  Precautions Precautions: Back Precaution Comments: re-educated on BLT, pt cont to be reminded during function Required Braces or Orthoses: Spinal Brace Spinal Brace: Thoracolumbosacral orthotic, Applied in sitting position Restrictions Weight Bearing Restrictions: No General:   Pain: Pain Assessment Pain Scale: 0-10 Pain Score: 8  Pain Type: Acute pain Pain Location: Back Pain Orientation: Right Pain Descriptors / Indicators: Aching;Sore Pain Frequency: Constant Pain Onset: On-going Patients Stated Pain Goal:  0 Pain Intervention(s): Medication (See eMAR);Repositioned    Therapy/Group: Individual Therapy  Deniece Ree PT, DPT, CBIS  Supplemental Physical Therapist [facility]    Pager [phone] Acute Rehab Office [phone]   07/19/2018, 11:59 AM

## 2018-07-20 ENCOUNTER — Inpatient Hospital Stay (HOSPITAL_COMMUNITY): Payer: Medicare Other | Admitting: Occupational Therapy

## 2018-07-20 ENCOUNTER — Inpatient Hospital Stay (HOSPITAL_COMMUNITY): Payer: Medicare Other

## 2018-07-20 NOTE — Progress Notes (Signed)
Physical Therapy Session Note  Patient Details  Name: Brian [name] MRN: [id number] Date of Birth: 1945/11/27  Today's Date: 07/20/2018 PT Individual Time: 1345-1430 PT Individual Time Calculation (min): 45 min   Short Term Goals: Week 3:  PT Short Term Goal 1 (Week 3): pt will move supine> sit with min assist PT Short Term Goal 2 (Week 3): pt will perform slide board trasnfer with min assist 50% of trials PT Short Term Goal 3 (Week 3): pt will perform gait x 10' with LRAD PT Short Term Goal 4 (Week 3): pt will state 3/3 spinal precautions  Skilled Therapeutic Interventions/Progress Updates:    Patient meeting with palliative care practitioner initially.  Reports IV medication due to be disconnected and RN stated IV team called 20 minutes prior.  Patient rolled to R min A and side to sit mod A.  Donned binder and TLSO sitting EOB.  Slide board transfer to w/c mod A.  Seated for LE therex hip abduction with theraband x 10, marching with theraband x 10, LAQ and hamstring curls x 10 with band for resistance.  Propelled w/c with feet x 100' prior to fatigue.  Patient on 2 L O2 throughout.  Performed car transafer to 19.5" height to simulate [place] as pt's daughter has one (their van and SUV 31").  Patient used slide board with mod A and increased time cues for technique and assist for both legs in and out.  Wife & daughter present to observe technique. Patient endorsed fatigue.  Assist to room in w/c.  Left seated with wife and daughter present.  Discussed HEP with family states he has not wanted to do despite their encouragement.  Encouraged performance once supine.    Therapy Documentation Precautions:  Precautions Precautions: Back Precaution Comments: re-educated on BLT, pt cont to be reminded during function Required Braces or Orthoses: Spinal Brace Spinal Brace: Thoracolumbosacral orthotic, Applied in sitting position Restrictions Weight Bearing Restrictions: No General: PT Amount  of Missed Time (min): 15 Minutes PT Missed Treatment Reason: Other (Comment)(meeting with palliative care) Pain: Pain Assessment Pain Score: 4  Pain Type: Acute pain Pain Location: Back Pain Intervention(s): Ambulation/increased activity    Therapy/Group: Individual Therapy  Reginia Naas  [place], [place] 07/20/2018  07/20/2018, 6:07 PM

## 2018-07-20 NOTE — Plan of Care (Signed)
  Problem: Consults Goal: RH SPINAL CORD INJURY PATIENT EDUCATION Description  See Patient Education module for education specifics.  Outcome: Progressing Goal: Skin Care Protocol Initiated - if Braden Score 18 or less Description If consults are not indicated, leave blank or document N/A Outcome: Progressing   Problem: SCI BOWEL ELIMINATION Goal: RH STG MANAGE BOWEL WITH ASSISTANCE Description STG Manage Bowel with Mod.Assistance.  Outcome: Progressing Goal: RH STG SCI MANAGE BOWEL WITH MEDICATION WITH ASSISTANCE Description STG SCI Manage bowel with medication with Mod.assistance.  Outcome: Progressing Goal: RH STG MANAGE BOWEL W/EQUIPMENT W/ASSISTANCE Description STG Manage Bowel With Equipment With mod. Assistance  Outcome: Progressing Goal: RH STG SCI MANAGE BOWEL PROGRAM W/ASSIST OR AS APPROPRIATE Description STG SCI Manage bowel program w/mod.assist or as appropriate.  Outcome: Progressing   Problem: SCI BLADDER ELIMINATION Goal: RH STG MANAGE BLADDER WITH ASSISTANCE Description STG Manage Bladder With mod.Assistance  Outcome: Progressing   Problem: RH SKIN INTEGRITY Goal: RH STG SKIN FREE OF INFECTION/BREAKDOWN Description With mod. Assist.  Outcome: Progressing Goal: RH STG MAINTAIN SKIN INTEGRITY WITH ASSISTANCE Description STG Maintain Skin Integrity With Mod.Assistance.  Outcome: Progressing Goal: RH STG ABLE TO PERFORM INCISION/WOUND CARE W/ASSISTANCE Description STG Able To Perform Incision/Wound Care With Mod.Assistance.  Outcome: Progressing   Problem: RH SAFETY Goal: RH STG ADHERE TO SAFETY PRECAUTIONS W/ASSISTANCE/DEVICE Description STG Adhere to Safety Precautions With Mod.Assistance/Device.  Outcome: Progressing   Problem: RH PAIN MANAGEMENT Goal: RH STG PAIN MANAGED AT OR BELOW PT'S PAIN GOAL Description Less than 3,on 1 to 10 scale.  Outcome: Progressing

## 2018-07-20 NOTE — Progress Notes (Signed)
Occupational Therapy Session Note  Patient Details  Name: Brian [name] MRN: [id number] Date of Birth: Jun 09, 1946  Today's Date: 07/20/2018 OT Individual Time: 9629-5284 OT Individual Time Calculation (min): 75 min    Short Term Goals: Week 3:  OT Short Term Goal 1 (Week 3): Pt will consistently complete sliding board transfer with min A in order to reduce caregiver burden OT Short Term Goal 2 (Week 3): Pt will tolerate completing UB bathing/dressing from EOB with supervision/set-up OT Short Term Goal 3 (Week 3): Pt will complete bed mobility at supervision level during bed level ADLs in order to reduce caregiver burden OT Short Term Goal 4 (Week 3): Pt will tolerate sitting OOB for>2 hours in order to increase functional activity tolerance.  Skilled Therapeutic Interventions/Progress Updates:    Pt seen for OT session focusing on functional transfers and family education. Pt in supine upon arrival with wife and daughter present. Pt voicing pain 5/10 inlower back, pre-medicated prior to tx session and agreeable to cont without intervention. Repositioned throughout session for comfort.  He transferred to eob with min a using hospital bed functions and assist for management of LEs during transition off EOB.  RN adminsited medications while pt seated EOB, pt requiring signifincatny amaount of time to take all pills. Education/discussion with pt's wife regarding plans for toileting and addressing that this session as well as DME and reducing caregiver burden.  He completed sliding board transfers throughout session EOB>drop arm BSC>w/c. Completed with min A overall from therapist with heavy assist to steady equipment and VCs for hand placement.  Toileting task completed on [facility] with total A and +2 at times to assist with clothing management and steadying assist during lateral leans. He was unable to stand from 23" BSC and therefore completed lateral leans for clothing management and hygiene to  be completed by caregiver. [name] believes this method will work at d/c as it is easier on her and pt than completing at bed level.  Throughout tasks, pt required significantly increased time and rest breaks.  Pt left seated in w/c at end of session, all needs in reach with wife present.  Throughout session, education provided regarding DME, reducing caregiver burden, energy conservation, modified ADLs, OT goals, and d/c planning.   Therapy Documentation Precautions:  Precautions Precautions: Back Precaution Comments: re-educated on BLT, pt cont to be reminded during function Required Braces or Orthoses: Spinal Brace Spinal Brace: Thoracolumbosacral orthotic, Applied in sitting position Restrictions Weight Bearing Restrictions: No Pain:   5/10, pre-medicated prior to tx session. Re-positioned for comfort throughout   Therapy/Group: Individual Therapy  Aziz Slape L 07/20/2018, 7:00 AM

## 2018-07-20 NOTE — Progress Notes (Signed)
[place] PHYSICAL MEDICINE & REHABILITATION PROGRESS NOTE  Subjective/Complaints: Patient seen laying in bed this morning.  He states he slept well overnight.  He states he was evaluated by Ortho yesterday with plans for I&D tomorrow.  ROS: Denies CP, shortness of breath, nausea, vomiting, diarrhea.  Objective: Vital Signs: Blood pressure 93/60, pulse 92, temperature 98.6 F (37 C), temperature source Oral, resp. rate 17, height '5\' 10"'$  (1.778 m), weight 85.4 kg, SpO2 93 %. No results found. Recent Labs    07/19/18 0459  WBC 16.2*  HGB 9.0*  HCT 31.0*  PLT 364   No results for input(s): NA, K, CL, CO2, GLUCOSE, BUN, CREATININE, CALCIUM in the last 72 hours.  Physical Exam: BP 93/60 (BP Location: Left Arm)   Pulse 92   Temp 98.6 F (37 C) (Oral)   Resp 17   Ht '5\' 10"'$  (1.778 m)   Wt 85.4 kg   SpO2 93%   BMI 27.01 kg/m  Constitutional: No distress . Vital signs reviewed. HENT: Normocephalic.  Atraumatic. Eyes: EOMI. No discharge. Cardiovascular: RRR.  No JVD. Respiratory: CTA bilaterally.  Normal effort.  + [facility]: BS +. Non-distended. Musculoskeletal: Bilateral lower extremity edema, left greater than right, improving Neurological: He is alert and oriented  Dysphonia Able to follow basic commands without difficulty.  Motor:  Right lower extremity: 3+/5 proximal distal, pain inhibition, improving Left lower extremity: Hip flexion 3-/5, knee extension 4-/5, ankle dorsiflexion 4/5, some pain inhibition, improving Skin: Warm and dry. Intact.  Back incision with VAC Psychiatric: Flat  Assessment/Plan: 1. Functional deficits secondary to lung cancer with mets to ribs, vertebrae with oncological fracture status post lumbar fusion which require 3+ hours per day of interdisciplinary therapy in a comprehensive inpatient rehab setting.  Physiatrist is providing close team supervision and 24 hour management of active medical problems listed below.  Physiatrist and rehab  team continue to assess barriers to discharge/monitor patient progress toward functional and medical goals  Care Tool:  Bathing  Bathing activity did not occur: Safety/medical concerns(Unable to assess 2/2 drop in BP with upright positioning) Body parts bathed by patient: Right arm, Left arm, Chest, Abdomen, Front perineal area, Right upper leg, Left upper leg, Face   Body parts bathed by helper: Right lower leg, Left lower leg, Buttocks     Bathing assist Assist Level: Moderate Assistance - Patient 50 - 74%     Upper Body Dressing/Undressing Upper body dressing Upper body dressing/undressing activity did not occur (including orthotics): Safety/medical concerns(B/P issues ) What is the patient wearing?: Orthosis Orthosis activity level: Performed by helper  Upper body assist Assist Level: Total Assistance - Patient < 25%    Lower Body Dressing/Undressing Lower body dressing      What is the patient wearing?: Pants     Lower body assist Assist for lower body dressing: 2 Helpers     Toileting Toileting Toileting Activity did not occur (Clothing management and hygiene only): Safety/medical concerns(Unable to assess 2/2 drop in BP with upright positioning)  Toileting assist Assist for toileting: 2 Helpers     Transfers Chair/bed transfer  Transfers assist     Chair/bed transfer assist level: Minimal Assistance - Patient > 75%     Locomotion Ambulation   Ambulation assist   Ambulation activity did not occur: Safety/medical concerns  Assist level: Minimal Assistance - Patient > 75% Assistive device: Walker-rolling Max distance: 23'   Walk 10 feet activity   Assist     Assist level: Moderate Assistance -  Patient - 50 - 74% Assistive device: Walker-rolling   Walk 50 feet activity   Assist           Walk 150 feet activity   Assist           Walk 10 feet on uneven surface  activity   Assist           Wheelchair     Assist Will  patient use wheelchair at discharge?: Yes Type of Wheelchair: Manual Wheelchair activity did not occur: Safety/medical concerns(hypotensive)  Wheelchair assist level: Supervision/Verbal cueing Max wheelchair distance: 5f LEs     Wheelchair 50 feet with 2 turns activity    Assist    Wheelchair 50 feet with 2 turns activity did not occur: Safety/medical concerns   Assist Level: Supervision/Verbal cueing   Wheelchair 150 feet activity     Assist Wheelchair 150 feet activity did not occur: Safety/medical concerns   Assist Level: Dependent - Patient 0%      Medical Problem List and Plan: 1.Functional and mobility deficitssecondary to metastatic lung cancer to left clavicle, ribs, L4 vertebral body with fracture. Pt is s/p L4 corpectomy and L3-5 fusion by Dr. BRolena Infanteon 06/11/18  Continue CIR  Per hematology/oncology, awaiting improvement in function and strength prior to chemotherapy.  Rad-onc involved.  Poor prognosis.  Status post I&D of back wound 1/17 - plan to close wound next week 2. DVT: dopplers done this evening and patient has DVT in left CFV             IVC filter placement on 1/8  Pt not a candidate for anticoagulation due to hematoma and bleeding riskat present although long term he is high VTE risk 3. Pain Management: dilaudid prn. Unable to tolerate Oxycodone   Fentanyl increased to 25 mcg on 1/17, increased to 50 on 1/21  Appreciate palliative care consult- notes reviewed  Cymbalta added on 1/17, increased on 1/24  Right subacromial and lumbar paraspinal trigger point injected by Ortho on 1/22-notes reviewed  Voltaren gel added on 1/23 with improvement  Relatively controlled on 1/28 4. Mood:LCSW to follow for evaluation and support.  Cymbalta added  Patient more calm when daughter not present 5. Neuropsych: This patientiscapable of making decisions on hisown behalf. 6. Skin/Wound Care:  VAC in place on back wound per ortho, discussed with  WOC, suboptimal healing, plan for I&D tomorrow 7. Fluids/Electrolytes/Nutrition:Monitor I/O.  8.Blood pressure:  Abdominal binder as needed  Relatively controlled on 1/28, orthostatics negative  Monitor with increased mobility 9. ABLA: Continue to monitor with serial checks.   Hemoglobin 9.0 on 1/27  Iron studies consistent with anemia of chronic disease: Ferritin levels high  IV iron 10. Hypokalemia:   Potassium 4.3 on 1/24   Supplement increased on 1/13  Magnesium 2.2 on 1/24   Supplement initiated on 1/14 11. Rash: Continue Clobetasol bidas well as hypoallergenic sheets. 12. Neuropathic pain: Improving on gabapentin 300 mg tid 13. Constipation:Managed with miralax prn--patient wants to manage his bowel program. 14.  Hypoalbuminemia  Supplement initiated on 1/8 15.  Sinus tachycardia  Metoprolol 12.5 twice daily started on 1/13, changed to Coreg on 1/15  Improved 16.  Hyponatremia  Sodium 135 on 1/24  Continue to monitor 17.  Wound infection  WBCs 16.2 on 1/27, trending up, await ID/Ortho recs  Labs ordered for tomorrow  Wound cultures with E. coli, pansensitive  Chest x-ray reviewed with patient, unremarkable for acute infectious process, repeat chest x-ray on 1/16 reviewed showing some lower  abnormality-continue to monitor  Afebrile  Appreciate ID recs, notes reviewed- IV ceftezole   Antibiotic beads placed by Ortho in wound  Blood cultures no growth  ESR/CRP elevated on 1/22  Back incision as above 18. Peripheral edema  Lasix 20 daily started on 1/15, increased to twice daily on 1/21 local care and elevation   Improving 19.  Sleep disturbance  Trazodone DC'd, Benadryl ordered for pruritus and sleep on 1/17  Improving. 20.  Pruritus.  Secondary to pain medications  Benadryl as needed, dose increased on 1/17  Improving 21.  Multiple joint pain  Multifactorial  Hip x-ray with degenerative changes  Right shoulder xray reviewed - degenerative changes with  ?atypical area of lucency  LOS: 21 days A FACE TO FACE EVALUATION WAS PERFORMED  Paiten Boies [name] 07/20/2018, 9:07 AM

## 2018-07-20 NOTE — Progress Notes (Signed)
Daily Progress Note   Patient Name: Brian [name]       Date: 07/20/2018 DOB: 17-Jul-1945  Age: 73 y.o. MRN#: [id number] Attending Physician: Jamse Arn, MD Primary Care Physician: [name], MD Admit Date: 06/29/2018  Reason for Consultation/Follow-up: psychosocial support.   Subjective: Patient is resting in bed. He states he feels fine. Wife and daughter at bedside. Wife states therapy did not go as well today 2/2 shoulder pain. Plans for OR tomorrow for I&D of op site.   Current Medications: Scheduled Meds:  . carvedilol  6.25 mg Oral BID WC  . clobetasol ointment   Topical BID  . diclofenac sodium  2 g Topical QID  . DULoxetine  60 mg Oral Daily  . feeding supplement (PRO-STAT SUGAR FREE 64)  30 mL Oral TID WC & HS  . fentaNYL  1 patch Transdermal Q72H  . furosemide  20 mg Oral BID  . gabapentin  300 mg Oral TID  . guaiFENesin  10 mL Oral Q6H  . hydrocerin   Topical BID  . HYDROcodone-acetaminophen  1 tablet Oral QHS  . magnesium oxide  200 mg Oral Daily  . methocarbamol  750 mg Oral QID  . pantoprazole  40 mg Oral Daily  . potassium chloride  30 mEq Oral BID  . senna-docusate  2 tablet Oral QPC supper  . tamsulosin  0.4 mg Oral QPC supper  . vitamin C  500 mg Oral BID  . zinc sulfate  220 mg Oral BID    Continuous Infusions: . sodium chloride 10 mL/hr at 07/19/18 1526  .  ceFAZolin (ANCEF) IV 2 g (07/20/18 1317)    PRN Meds: sodium chloride, camphor-menthol, diphenhydrAMINE, heparin lock flush, HYDROcodone-acetaminophen, HYDROmorphone HCl, menthol-cetylpyridinium **OR** phenol, polyethylene glycol, prochlorperazine **OR** prochlorperazine **OR** prochlorperazine, sodium chloride flush, sodium phosphate  Physical Exam Pulmonary:     Effort: Pulmonary  effort is normal.             Vital Signs: BP 93/60 (BP Location: Left Arm)   Pulse 92   Temp 98.6 F (37 C) (Oral)   Resp 17   Ht 5\' 10"  (1.778 m)   Wt 85.4 kg   SpO2 93%   BMI 27.01 kg/m  SpO2: SpO2: 93 % O2 Device: O2 Device: Room Air O2 Flow Rate: O2  Flow Rate (L/min): 2 L/min  Intake/output summary:   Intake/Output Summary (Last 24 hours) at 07/20/2018 1345 Last data filed at 07/20/2018 1045 Gross per 24 hour  Intake 931.98 ml  Output 1286 ml  Net -354.02 ml   LBM: Last BM Date: 07/20/18(with OT) Baseline Weight: Weight: 96.8 kg Most recent weight: Weight: 85.4 kg       Palliative Assessment/Data: 50%      Patient Active Problem List   Diagnosis Date Noted  . Pain   . Pain aggravated by physical activity   . Right hip pain   . Sleep disturbance   . Peripheral edema   . Leukocytosis   . Hyponatremia   . Sinus tachycardia   . Hypomagnesemia   . Orthostatic hypotension   . Labile blood pressure   . Cancer associated pain   . DVT of lower extremity (deep venous thrombosis) ([place])   . Malignant neoplasm of lung ([place])   . Hypoalbuminemia due to protein-calorie malnutrition ([place])   . Acute blood loss anemia   . Postoperative pain   . Pathological compression fracture of lumbar vertebra ([place]) 06/29/2018  . Metastatic lung cancer (metastasis from lung to other site) ([place]) 06/28/2018  . Rash 06/25/2018  . Hypokalemia 06/25/2018  . Normocytic anemia 06/25/2018  . S/P lumbar fusion 06/24/2018  . Palliative care by specialist   . Cancer related pain   . Lung cancer, primary, with metastasis from lung to other site, right ([place]) 06/14/2018  . Squamous cell lung cancer, right ([place]) 06/14/2018  . Lung cancer metastatic to bone ([place]) 06/14/2018  . Bronchitis due to tobacco use 06/07/2018  . Lumbar spine tumor 06/04/2018    Palliative Care Assessment & Plan   Recommendations/Plan:  Continuing rehab. Meeting tomorrow at 12:30 for [place] conversation.    Code  Status:    Code Status Orders  (From admission, onward)         Start     Ordered   06/29/18 1526  Full code  Continuous     06/29/18 1525        Code Status History    Date Active Date Inactive Code Status Order ID Comments User Context   06/11/2018 2031 06/29/2018 1440 Full Code [id number]  Milagros Loll, MD Inpatient   06/11/2018 2011 06/11/2018 2030 Full Code [id number]  Melina Schools, MD Inpatient   06/04/2018 1344 06/11/2018 2011 Full Code [id number]  Ardeen Jourdain, PA-C Inpatient       Prognosis:  Poor overall. Metastatic cancer with pathologic fracture diagnosed in December.   Discharge Planning:  To Be Determined  Care plan was discussed with RN  Thank you for allowing the Palliative Medicine Team to assist in the care of this patient.   Total Time 25 min Prolonged Time Billed no      Greater than 50%  of this time was spent counseling and coordinating care related to the above assessment and plan.  Asencion Gowda, NP  Please contact Palliative Medicine Team phone at [phone] for questions and concerns.

## 2018-07-20 NOTE — Progress Notes (Addendum)
Occupational Therapy Session Note  Patient Details  Name: Brian [name] MRN: [id number] Date of Birth: 05-May-1946  Today's Date: 07/20/2018 OT Individual Time: 9191-6606 OT Individual Time Calculation (min): 30 min (make up time)   Short Term Goals: Week 3:  OT Short Term Goal 1 (Week 3): Pt will consistently complete sliding board transfer with min A in order to reduce caregiver burden OT Short Term Goal 2 (Week 3): Pt will tolerate completing UB bathing/dressing from EOB with supervision/set-up OT Short Term Goal 3 (Week 3): Pt will complete bed mobility at supervision level during bed level ADLs in order to reduce caregiver burden OT Short Term Goal 4 (Week 3): Pt will tolerate sitting OOB for>2 hours in order to increase functional activity tolerance.  Skilled Therapeutic Interventions/Progress Updates:    Treatment session with focus on overall activity tolerance and strengthening.  Pt received upright in w/c reporting pain in Rt shoulder but wanting to work on strength and endurance activities.  Engaged in kicking ball in sitting with focus on endurance while engaging in education regarding back precautions. [name] provided pt with pneumonic "BLT" but pt unable to recall any of his back precautions.  Pt required demonstrations and functional tasks that require these movements, pt then able to recall 2 of 3.  Attempted BUE strengthening with 2# medicine ball but pt unable to complete even chest presses due to shoulder pain.  Educated on shoulder presses and rotations to allow for movement as able to increase participation in self-care tasks.  Pt reports these do not increase his pain.  Returned to room and left upright in w/c, per pt request.  Pt left with wife and daughter in room an all needs in reach.   Therapy Documentation Precautions:  Precautions Precautions: Back Precaution Comments: re-educated on BLT, pt cont to be reminded during function Required Braces or Orthoses:  Spinal Brace Spinal Brace: Thoracolumbosacral orthotic, Applied in sitting position Restrictions Weight Bearing Restrictions: No Pain:  Pt reports pain 5/10 in Rt shoulder.  RN aware.   Therapy/Group: Individual Therapy  Simonne Come 07/20/2018, 3:53 PM

## 2018-07-21 ENCOUNTER — Encounter (HOSPITAL_COMMUNITY): Payer: Self-pay | Admitting: Orthopedic Surgery

## 2018-07-21 ENCOUNTER — Encounter (HOSPITAL_COMMUNITY)
Admission: RE | Disposition: A | Discharge status: Hospice | Payer: Self-pay | Source: Intra-hospital | Attending: Physical Medicine & Rehabilitation

## 2018-07-21 ENCOUNTER — Inpatient Hospital Stay (HOSPITAL_COMMUNITY): Payer: Medicare Other | Admitting: Occupational Therapy

## 2018-07-21 ENCOUNTER — Inpatient Hospital Stay (HOSPITAL_COMMUNITY): Payer: Medicare Other

## 2018-07-21 ENCOUNTER — Inpatient Hospital Stay (HOSPITAL_COMMUNITY): Payer: Medicare Other | Admitting: Certified Registered Nurse Anesthetist

## 2018-07-21 ENCOUNTER — Encounter (HOSPITAL_COMMUNITY): Payer: Medicare Other | Admitting: Psychology

## 2018-07-21 ENCOUNTER — Inpatient Hospital Stay (HOSPITAL_COMMUNITY): Payer: Medicare Other | Admitting: Physical Therapy

## 2018-07-21 ENCOUNTER — Inpatient Hospital Stay (HOSPITAL_COMMUNITY): Admission: RE | Admit: 2018-07-21 | Payer: Medicare Other | Source: Home / Self Care | Admitting: Orthopedic Surgery

## 2018-07-21 DIAGNOSIS — C3400 Malignant neoplasm of unspecified main bronchus: Secondary | ICD-10-CM

## 2018-07-21 DIAGNOSIS — M5416 Radiculopathy, lumbar region: Secondary | ICD-10-CM

## 2018-07-21 HISTORY — PX: LUMBAR WOUND DEBRIDEMENT: SHX1988

## 2018-07-21 LAB — CBC WITH DIFFERENTIAL/PLATELET
Abs Immature Granulocytes: 0.36 10*3/uL — ABNORMAL HIGH (ref 0.00–0.07)
Basophils Absolute: 0.1 10*3/uL (ref 0.0–0.1)
Basophils Relative: 0 %
Eosinophils Absolute: 0.4 10*3/uL (ref 0.0–0.5)
Eosinophils Relative: 3 %
HCT: 29 % — ABNORMAL LOW (ref 39.0–52.0)
Hemoglobin: 8.8 g/dL — ABNORMAL LOW (ref 13.0–17.0)
Immature Granulocytes: 2 %
LYMPHS ABS: 1.8 10*3/uL (ref 0.7–4.0)
Lymphocytes Relative: 12 %
MCH: 27.7 pg (ref 26.0–34.0)
MCHC: 30.3 g/dL (ref 30.0–36.0)
MCV: 91.2 fL (ref 80.0–100.0)
MONO ABS: 1.5 10*3/uL — AB (ref 0.1–1.0)
Monocytes Relative: 9 %
Neutro Abs: 11.8 10*3/uL — ABNORMAL HIGH (ref 1.7–7.7)
Neutrophils Relative %: 74 %
Platelets: 331 10*3/uL (ref 150–400)
RBC: 3.18 MIL/uL — ABNORMAL LOW (ref 4.22–5.81)
RDW: 15.8 % — ABNORMAL HIGH (ref 11.5–15.5)
WBC: 15.9 10*3/uL — ABNORMAL HIGH (ref 4.0–10.5)
nRBC: 0 % (ref 0.0–0.2)

## 2018-07-21 LAB — BASIC METABOLIC PANEL
Anion gap: 10 (ref 5–15)
BUN: 16 mg/dL (ref 8–23)
CO2: 25 mmol/L (ref 22–32)
Calcium: 8.3 mg/dL — ABNORMAL LOW (ref 8.9–10.3)
Chloride: 97 mmol/L — ABNORMAL LOW (ref 98–111)
Creatinine, Ser: 0.51 mg/dL — ABNORMAL LOW (ref 0.61–1.24)
GFR calc Af Amer: >60 mL/min (ref >60)
GFR calc non Af Amer: >60 mL/min (ref >60)
Glucose, Bld: 123 mg/dL — ABNORMAL HIGH (ref 70–99)
Potassium: 3.9 mmol/L (ref 3.5–5.1)
Sodium: 132 mmol/L — ABNORMAL LOW (ref 135–145)

## 2018-07-21 SURGERY — LUMBAR WOUND DEBRIDEMENT
Anesthesia: General | Site: Spine Lumbar

## 2018-07-21 MED ORDER — SODIUM CHLORIDE 0.9 % IR SOLN
Status: DC | PRN
  Administered 2018-07-21 (×2): 3000 mL

## 2018-07-21 MED ORDER — SODIUM CHLORIDE 0.9 % IV SOLN
INTRAVENOUS | Status: DC | PRN
  Administered 2018-07-21: 25 ug/min via INTRAVENOUS

## 2018-07-21 MED ORDER — ONDANSETRON HCL 4 MG/2ML IJ SOLN: 4.0000 mg | Freq: Once | INTRAMUSCULAR | Status: DC | PRN

## 2018-07-21 MED ORDER — DEXAMETHASONE SODIUM PHOSPHATE 4 MG/ML IJ SOLN
INTRAMUSCULAR | Status: DC | PRN
  Administered 2018-07-21: 4 mg via INTRAVENOUS

## 2018-07-21 MED ORDER — PROPOFOL 10 MG/ML IV BOLUS
INTRAVENOUS | Status: AC
  Filled 2018-07-21: qty 20

## 2018-07-21 MED ORDER — LIDOCAINE 2% (20 MG/ML) 5 ML SYRINGE
INTRAMUSCULAR | Status: AC
  Filled 2018-07-21: qty 5

## 2018-07-21 MED ORDER — THROMBIN 20000 UNITS EX SOLR
CUTANEOUS | Status: AC
  Filled 2018-07-21: qty 20000

## 2018-07-21 MED ORDER — PHENYLEPHRINE HCL 10 MG/ML IJ SOLN
INTRAMUSCULAR | Status: DC | PRN
  Administered 2018-07-21 (×2): 80 ug via INTRAVENOUS

## 2018-07-21 MED ORDER — SUGAMMADEX SODIUM 200 MG/2ML IV SOLN
INTRAVENOUS | Status: DC | PRN
  Administered 2018-07-21: 175 mg via INTRAVENOUS

## 2018-07-21 MED ORDER — FENTANYL CITRATE (PF) 100 MCG/2ML IJ SOLN: 25.0000 ug | INTRAMUSCULAR | Status: DC | PRN

## 2018-07-21 MED ORDER — DEXAMETHASONE SODIUM PHOSPHATE 10 MG/ML IJ SOLN
INTRAMUSCULAR | Status: AC
  Filled 2018-07-21: qty 1

## 2018-07-21 MED ORDER — ROCURONIUM BROMIDE 50 MG/5ML IV SOSY
PREFILLED_SYRINGE | INTRAVENOUS | Status: AC
  Filled 2018-07-21: qty 10

## 2018-07-21 MED ORDER — ROCURONIUM BROMIDE 100 MG/10ML IV SOLN
INTRAVENOUS | Status: DC | PRN
  Administered 2018-07-21: 30 mg via INTRAVENOUS

## 2018-07-21 MED ORDER — FENTANYL CITRATE (PF) 250 MCG/5ML IJ SOLN
INTRAMUSCULAR | Status: AC
  Filled 2018-07-21: qty 5

## 2018-07-21 MED ORDER — FENTANYL CITRATE (PF) 100 MCG/2ML IJ SOLN
INTRAMUSCULAR | Status: DC | PRN
  Administered 2018-07-21: 150 ug via INTRAVENOUS
  Administered 2018-07-21: 50 ug via INTRAVENOUS

## 2018-07-21 MED ORDER — LIDOCAINE HCL (CARDIAC) PF 100 MG/5ML IV SOSY
PREFILLED_SYRINGE | INTRAVENOUS | Status: DC | PRN
  Administered 2018-07-21: 40 mg via INTRAVENOUS

## 2018-07-21 MED ORDER — 0.9 % SODIUM CHLORIDE (POUR BTL) OPTIME
TOPICAL | Status: DC | PRN
  Administered 2018-07-21: 1000 mL

## 2018-07-21 MED ORDER — PHENYLEPHRINE 40 MCG/ML (10ML) SYRINGE FOR IV PUSH (FOR BLOOD PRESSURE SUPPORT)
PREFILLED_SYRINGE | INTRAVENOUS | Status: AC
  Filled 2018-07-21: qty 10

## 2018-07-21 MED ORDER — ONDANSETRON HCL 4 MG/2ML IJ SOLN
INTRAMUSCULAR | Status: AC
  Filled 2018-07-21: qty 2

## 2018-07-21 MED ORDER — ONDANSETRON HCL 4 MG/2ML IJ SOLN
INTRAMUSCULAR | Status: DC | PRN
  Administered 2018-07-21: 4 mg via INTRAVENOUS

## 2018-07-21 MED ORDER — LACTATED RINGERS IV SOLN
INTRAVENOUS | Status: DC
  Administered 2018-07-21: 13:00:00 via INTRAVENOUS

## 2018-07-21 MED ORDER — PROPOFOL 10 MG/ML IV BOLUS
INTRAVENOUS | Status: DC | PRN
  Administered 2018-07-21: 150 mg via INTRAVENOUS
  Administered 2018-07-21: 50 mg via INTRAVENOUS

## 2018-07-21 SURGICAL SUPPLY — 67 items
CANISTER SUCT 3000ML PPV (MISCELLANEOUS) ×3 IMPLANT
CLOSURE WOUND 1/2 X4 (GAUZE/BANDAGES/DRESSINGS) ×1
CORDS BIPOLAR (ELECTRODE) ×3 IMPLANT
COVER SURGICAL LIGHT HANDLE (MISCELLANEOUS) ×1 IMPLANT
DRAPE HALF SHEET 40X57 (DRAPES) ×3 IMPLANT
DRAPE POUCH INSTRU U-SHP 10X18 (DRAPES) ×3 IMPLANT
DRAPE SURG 17X23 STRL (DRAPES) ×3 IMPLANT
DRAPE U-SHAPE 47X51 STRL (DRAPES) ×3 IMPLANT
DRSG AQUACEL AG ADV 3.5X10 (GAUZE/BANDAGES/DRESSINGS) ×1 IMPLANT
DRSG VAC ATS MED SENSATRAC (GAUZE/BANDAGES/DRESSINGS) ×2 IMPLANT
DURAPREP 26ML APPLICATOR (WOUND CARE) ×1 IMPLANT
ELECT BLADE 4.0 EZ CLEAN MEGAD (MISCELLANEOUS)
ELECT CAUTERY BLADE 6.4 (BLADE) ×3 IMPLANT
ELECT PENCIL ROCKER SW 15FT (MISCELLANEOUS) ×3 IMPLANT
ELECT REM PT RETURN 9FT ADLT (ELECTROSURGICAL) ×3
ELECTRODE BLDE 4.0 EZ CLN MEGD (MISCELLANEOUS) IMPLANT
ELECTRODE REM PT RTRN 9FT ADLT (ELECTROSURGICAL) ×1 IMPLANT
EVACUATOR 1/8 PVC DRAIN (DRAIN) IMPLANT
GLOVE BIO SURGEON STRL SZ 6.5 (GLOVE) ×2 IMPLANT
GLOVE BIO SURGEONS STRL SZ 6.5 (GLOVE) ×1
GLOVE BIOGEL PI IND STRL 6.5 (GLOVE) ×1 IMPLANT
GLOVE BIOGEL PI IND STRL 7.0 (GLOVE) IMPLANT
GLOVE BIOGEL PI IND STRL 7.5 (GLOVE) IMPLANT
GLOVE BIOGEL PI IND STRL 8.5 (GLOVE) ×1 IMPLANT
GLOVE BIOGEL PI INDICATOR 6.5 (GLOVE) ×2
GLOVE BIOGEL PI INDICATOR 7.0 (GLOVE) ×2
GLOVE BIOGEL PI INDICATOR 7.5 (GLOVE) ×4
GLOVE BIOGEL PI INDICATOR 8.5 (GLOVE) ×2
GLOVE SS BIOGEL STRL SZ 8.5 (GLOVE) ×1 IMPLANT
GLOVE SUPERSENSE BIOGEL SZ 8.5 (GLOVE) ×2
GLOVE SURG SS PI 7.0 STRL IVOR (GLOVE) ×8 IMPLANT
GOWN STRL REUS W/ TWL LRG LVL3 (GOWN DISPOSABLE) ×1 IMPLANT
GOWN STRL REUS W/TWL 2XL LVL3 (GOWN DISPOSABLE) ×4 IMPLANT
GOWN STRL REUS W/TWL LRG LVL3 (GOWN DISPOSABLE) ×4
HANDPIECE INTERPULSE COAX TIP (DISPOSABLE) ×2
KIT BASIN OR (CUSTOM PROCEDURE TRAY) ×3 IMPLANT
KIT TURNOVER KIT B (KITS) ×3 IMPLANT
NDL SPNL 18GX3.5 QUINCKE PK (NEEDLE) ×2 IMPLANT
NEEDLE 22X1 1/2 (OR ONLY) (NEEDLE) ×1 IMPLANT
NEEDLE SPNL 18GX3.5 QUINCKE PK (NEEDLE) IMPLANT
NS IRRIG 1000ML POUR BTL (IV SOLUTION) ×3 IMPLANT
PACK LAMINECTOMY ORTHO (CUSTOM PROCEDURE TRAY) ×3 IMPLANT
PACK UNIVERSAL I (CUSTOM PROCEDURE TRAY) ×3 IMPLANT
PAD ARMBOARD 7.5X6 YLW CONV (MISCELLANEOUS) ×8 IMPLANT
PAD NEG PRESSURE SENSATRAC (MISCELLANEOUS) ×2 IMPLANT
PATTIES SURGICAL .5 X.5 (GAUZE/BANDAGES/DRESSINGS) IMPLANT
PATTIES SURGICAL .5 X1 (DISPOSABLE) IMPLANT
SET HNDPC FAN SPRY TIP SCT (DISPOSABLE) IMPLANT
SPONGE LAP 18X18 RF (DISPOSABLE) ×2 IMPLANT
SPONGE SURGIFOAM ABS GEL 100 (HEMOSTASIS) IMPLANT
STRIP CLOSURE SKIN 1/2X4 (GAUZE/BANDAGES/DRESSINGS) ×2 IMPLANT
SURGIFLO W/THROMBIN 8M KIT (HEMOSTASIS) IMPLANT
SUT BONE WAX W31G (SUTURE) ×1 IMPLANT
SUT MON AB 3-0 SH 27 (SUTURE)
SUT MON AB 3-0 SH27 (SUTURE) ×1 IMPLANT
SUT VIC AB 0 CT1 27 (SUTURE)
SUT VIC AB 0 CT1 27XBRD ANBCTR (SUTURE) ×1 IMPLANT
SUT VIC AB 1 CTX 36 (SUTURE)
SUT VIC AB 1 CTX36XBRD ANBCTR (SUTURE) ×2 IMPLANT
SUT VIC AB 2-0 CT1 18 (SUTURE) ×1 IMPLANT
SYR BULB IRRIGATION 50ML (SYRINGE) ×3 IMPLANT
SYR CONTROL 10ML LL (SYRINGE) ×3 IMPLANT
TOWEL OR 17X24 6PK STRL BLUE (TOWEL DISPOSABLE) ×3 IMPLANT
TOWEL OR 17X26 10 PK STRL BLUE (TOWEL DISPOSABLE) ×3 IMPLANT
WATER STERILE IRR 1000ML POUR (IV SOLUTION) ×3 IMPLANT
WND VAC CANISTER 500ML (MISCELLANEOUS) ×2 IMPLANT
YANKAUER SUCT BULB TIP NO VENT (SUCTIONS) ×3 IMPLANT

## 2018-07-21 NOTE — Progress Notes (Signed)
    Subjective: Procedure(s) (LRB): LUMBAR WOUND DEBRIDEMENT (N/A) Day of Surgery  Patient reports pain as 4 on 0-10 scale.  Reports decreased leg pain reports incisional back pain   Positive void Positive bowel movement Positive flatus Negative chest pain or shortness of breath  Objective: Vital signs in last 24 hours: Temp:  [98.4 F (36.9 C)-99.3 F (37.4 C)] 98.4 F (36.9 C) (01/29 0532) Pulse Rate:  [91-105] 105 (01/29 1047) Resp:  [16-18] 18 (01/29 0532) BP: (92-110)/(64-74) 92/66 (01/29 1047) SpO2:  [97 %-99 %] 99 % (01/29 0532) Weight:  [85.4 kg] 85.4 kg (01/29 1252)  Intake/Output from previous day: 01/28 0701 - 01/29 0700 In: 342 [P.O.:342] Out: 400 [Urine:400]  Labs: Recent Labs    07/19/18 0459 07/21/18 0230  WBC 16.2* 15.9*  RBC 3.36* 3.18*  HCT 31.0* 29.0*  PLT 364 331   Recent Labs    07/21/18 0230  NA 132*  K 3.9  CL 97*  CO2 25  BUN 16  CREATININE 0.51*  GLUCOSE 123*  CALCIUM 8.3*   No results for input(s): LABPT, INR in the last 72 hours.  Physical Exam: ABD soft Intact pulses distally Dorsiflexion/Plantar flexion intact Incision: vac in place Compartment soft Body mass index is 27.01 kg/m.  Wound was inspected yesterday: Still some purulent area noted superficially.  Could not appreciate the deep wound.  No active purulent pus was draining from the wound.  Left clavicle.  The mass has increased in size.  It is become more tender and painful.   Assessment/Plan: Patient stable  xrays n/a Continue mobilization with physical therapy Continue care  1.  I will speak with Dr. Marin Olp about considering radiation therapy to the left clavicle in order to address the increasing mass (metastatic tumor). 2.  Plan on repeat I&D today in the OR to aid in the healing process of the posterior spinal wound infection. 3.  Patient has been making progress in physical therapy.  His overall pain is improving and he is becoming more mobile.  Did  discuss the procedure as well as the risks and benefits with the patient and his wife and consent was obtained.  Melina Schools, MD [facility] [phone]

## 2018-07-21 NOTE — Progress Notes (Signed)
Patient returned from procedure, no acute distress. Tolerating ice chips.

## 2018-07-21 NOTE — Progress Notes (Addendum)
Occupational Therapy Weekly Progress Note  Patient Details  Name: Brian [name] MRN: [id number] Date of Birth: 04/11/1946  Beginning of progress report period: July 15, 2018 End of progress report period: July 21, 2018  Today's Date: 07/21/2018 OT Individual Time: 0930-1000 OT Individual Time Calculation (min): 30 min   Missed Second session: Today's Date: 07/21/2018 OT Individual Missed Time: 30 min (off unit for I/D procedure)    Patient has met 3 of 4 short term goals.  Pt cont to make slow but steady progress towards OT goals. He is completing basic sliding board transfers with min A-CGA. He requires max-total A for LB dressing and toileting tasks due to limitations in pain, positioning and motivation. Pt and wife have requested therapy focus on general strengthening and transfers and not ADLs as wife feels she can more easily provide total A for these tasks and need to focusing on pt's ability to transfer more independently. Have begun completing sliding board transfers to drop arm BSC for toileting and potentially bathing/dressing routine. Requires total A for toileting and LB clothing management.  Pt cont to require significantly increased time and rest breaks throughout all tasks. Pain management is improving, however, pt more lethargic as a result of increase in pain meds.  [name] has begun hands on training with sliding board transfers, however, will cont to require more hands on training and education in prep for d/c home.   Patient continues to demonstrate the following deficits: muscle weakness, decreased cardiorespiratoy endurance and decreased sitting balance, decreased standing balance, decreased postural control, decreased balance strategies and difficulty maintaining precautions and therefore will continue to benefit from skilled OT intervention to enhance overall performance with BADL and Reduce care partner burden.  Patient progressing toward long term  goals..  Continue plan of care.  OT Short Term Goals Week 3:  OT Short Term Goal 1 (Week 3): Pt will consistently complete sliding board transfer with min A in order to reduce caregiver burden OT Short Term Goal 1 - Progress (Week 3): Met OT Short Term Goal 2 (Week 3): Pt will tolerate completing UB bathing/dressing from EOB with supervision/set-up OT Short Term Goal 2 - Progress (Week 3): Met OT Short Term Goal 3 (Week 3): Pt will complete bed mobility at supervision level during bed level ADLs in order to reduce caregiver burden OT Short Term Goal 3 - Progress (Week 3): Not met OT Short Term Goal 4 (Week 3): Pt will tolerate sitting OOB for>2 hours in order to increase functional activity tolerance. OT Short Term Goal 4 - Progress (Week 3): Met Week 4:  OT Short Term Goal 1 (Week 4): Pt will complete toileting task with assist +1 in order to reduce caregiver burden OT Short Term Goal 2 (Week 4): Pt will verbalize correct set-up of w/c in prep for sliding board transfer with no more than 1 VC OT Short Term Goal 3 (Week 4): Pt will transfer to EOB with supervision using hospital bed functions with supervision in prep for ADL task  Skilled Therapeutic Interventions/Progress Updates:    Session One: Pt seen for OT session focusing on LE strengthening. Pt sitting up in w/c upon arrival with wife present. Pt reported pain as well managed and not needing any intervention. Pt and wife requesting therapist work on LE strengthening this session. He was taken to therapy gym total A in w/c for time and energy conservation. Completed LE strengthening activities from w/c level, as noted below.   x2 sets  of 10 LARQ x1 set of 5 knee raises x1 set of 10  glute squeezes with ball placed btwn legs.   Pt required VCs throughout task for attention to task, easily distracted by internal and external factors. Education provided regarding importance of quality vs quantity with all exercises.  [name] assisted  him back to room in w/c at end of session.   Session Two: Therapist returned at 1400 for scheduled therapy session. Pt already off unit for I/D procedure. Missed 30 min tx session as a result. Will attempt to make-up as pt appropriate.     Therapy Documentation Precautions:  Precautions Precautions: Back Precaution Comments: re-educated on BLT, pt cont to be reminded during function Required Braces or Orthoses: Spinal Brace Spinal Brace: Thoracolumbosacral orthotic, Applied in sitting position Restrictions Weight Bearing Restrictions: No   Therapy/Group: Individual Therapy  Abdikadir Fohl L 07/21/2018, 7:13 AM

## 2018-07-21 NOTE — Brief Op Note (Signed)
07/21/2018  3:10 PM  PATIENT:  Brian Brian [name]  74 y.o. male  PRE-OPERATIVE DIAGNOSIS:  tumor spine  POST-OPERATIVE DIAGNOSIS:  tumor spine  PROCEDURE:  Procedure(s): LUMBAR WOUND DEBRIDEMENT (N/A)  SURGEON:  Surgeon(s) and Role:    Melina Schools, MD - Primary  PHYSICIAN ASSISTANT:   ASSISTANTS: Amanda Ward, PA  ANESTHESIA:   general  EBL:  minimal   BLOOD ADMINISTERED:none  DRAINS: wound vac   LOCAL MEDICATIONS USED:  NONE  SPECIMEN:  No Specimen  DISPOSITION OF SPECIMEN:  N/A  COUNTS:  YES  TOURNIQUET:  * No tourniquets in log *  DICTATION: .Dragon Dictation  PLAN OF CARE: Admit to inpatient   PATIENT DISPOSITION:  PACU - hemodynamically Brian [name].

## 2018-07-21 NOTE — Op Note (Signed)
Operative report  Preoperative diagnosis: Metastatic lung cancer status post anterior posterior lumbar 3-5 instrumented fusion with postoperative wound infection.  Postoperative diagnosis: Same  Operative procedure repeat posterior lumbar irrigation and debridement and application of wound VAC  First assistant: Cleta Alberts, PA  Complications: None  Indications: Brian [name] is a very pleasant 73 year old gentleman who is well-known to me.  He has been under my care since mid December 2019.  He was diagnosed with metastatic lung cancer and underwent an L 4 corpectomy with L 3-5 reconstruction and posterior supplemental pedicle screw fixation and ultimately then went on to have a posterior L4-5 decompression.  Unfortunately he developed a postoperative wound infection involving the midline lumbar incision and the superior left MIS pedicle screw insertion incision.  On 07/09/2018 he underwent a formal I&D with application of wound VAC.  He has been having frequent wound VAC changes but unfortunately continued to have some purulent material in the wounds.  Initially the superior left wound was closed but over the last few days noted to have some signs of early dehiscence.  As a result of the ongoing concern over the wound healing I like to take him back to the operating room for repeat debridement irrigation and application of wound VAC.  I have explained the risks and benefits and indications for surgery with the patient and his wife and consent was obtained.  Operative note.  Patient was brought the operating room placed by the operating table.  After successful induction of general anesthesia and endotracheal patient teds SCD these were applied and the back was prepped and draped with Betadine paint and scrub.  Timeout was taken to confirm patient procedure and all other important data.  The midline posterior lumbar incision was debrided sharply with a 10 blade scalpel.  I debrided the skin edges and the  deep tissue until I had bleeding healthy tissue.  I then went deep to the fascia and there was no purulent material or exudate noted.  I then went to the superior left-sided incision remove the suture and there was signs of a dehiscence.  I then sharply debrided the wound edges and sharply debrided the deep tissue.  At this point I then took 3 L of normal saline and combined it with 60 cc of Betadine and pull and post irrigated that through both wounds.  I then irrigated a second 3 L of normal saline through both wounds.  At this point I had bleeding healthy-appearing tissue at both wound beds.  At this point I elected not to close either wound since I knew I would have to come back for primary closure.  The wound VAC was packed into both wounds and then sealed.  The patient was then extubated and transferred the PACU without incident.  The end of the case all needle sponge counts were correct.  Will be readmitted to the rehab floor and he will continue to have 3 times a week VAC dressing changes and inpatient rehab.

## 2018-07-21 NOTE — Progress Notes (Signed)
[facility] PHYSICAL MEDICINE & REHABILITATION PROGRESS NOTE  Subjective/Complaints:   States I and D is on for ~330pm, GOC at 1230p Cannot control bowels but good bladder control  ROS: Denies CP, shortness of breath, nausea, vomiting, diarrhea.  Objective: Vital Signs: Blood pressure 110/74, pulse 91, temperature 98.4 F (36.9 C), temperature source Oral, resp. rate 18, height _0  (1.778 m), weight 85.4 kg, SpO2 99 %. No results found. Recent Labs    07/19/18 0459 07/21/18 0230  WBC 16.2* 15.9*  HGB 9.0* 8.8*  HCT 31.0* 29.0*  PLT 364 331   Recent Labs    07/21/18 0230  NA 132*  K 3.9  CL 97*  CO2 25  GLUCOSE 123*  BUN 16  CREATININE 0.51*  CALCIUM 8.3*    Physical Exam: BP 110/74 (BP Location: Left Arm)   Pulse 91   Temp 98.4 F (36.9 C) (Oral)   Resp 18   Ht _1  (1.778 m)   Wt 85.4 kg   SpO2 99%   BMI 27.01 kg/m  Constitutional: No distress . Vital signs reviewed. HENT: Normocephalic.  Atraumatic. Eyes: EOMI. No discharge. Cardiovascular: RRR.  No JVD. Respiratory: CTA bilaterally.  Normal effort.  +  GI: BS +. Non-distended. Musculoskeletal: Bilateral lower extremity edema, left greater than right, improving Neurological: He is alert and oriented  Dysphonia Able to follow basic commands without difficulty.  Motor:  Right lower extremity: 3+/5 proximal distal, pain inhibition, improving Left lower extremity: Hip flexion 3-/5, knee extension 4-/5, ankle dorsiflexion 4/5, some pain inhibition, improving Skin: Warm and dry. Intact.  Back incision with VAC Psychiatric: Flat  Assessment/Plan: 1. Functional deficits secondary to lung cancer with mets to ribs, vertebrae with oncological fracture status post lumbar fusion which require 3+ hours per day of interdisciplinary therapy in a comprehensive inpatient rehab setting.  Physiatrist is providing close team supervision and 24 hour management of active medical problems listed  below.  Physiatrist and rehab team continue to assess barriers to discharge/monitor patient progress toward functional and medical goals  Care Tool:  Bathing  Bathing activity did not occur: Safety/medical concerns(Unable to assess 2/2 drop in BP with upright positioning) Body parts bathed by patient: Right arm, Left arm, Chest, Abdomen, Front perineal area, Right upper leg, Left upper leg, Face   Body parts bathed by helper: Right lower leg, Left lower leg, Buttocks     Bathing assist Assist Level: Moderate Assistance - Patient 50 - 74%     Upper Body Dressing/Undressing Upper body dressing Upper body dressing/undressing activity did not occur (including orthotics): Safety/medical concerns(B/P issues ) What is the patient wearing?: Orthosis Orthosis activity level: Performed by helper  Upper body assist Assist Level: Total Assistance - Patient < 25%    Lower Body Dressing/Undressing Lower body dressing      What is the patient wearing?: Pants, Incontinence brief     Lower body assist Assist for lower body dressing: 2 Helpers     Toileting Toileting Toileting Activity did not occur (Clothing management and hygiene only): Safety/medical concerns(Unable to assess 2/2 drop in BP with upright positioning)  Toileting assist Assist for toileting: 2 Helpers     Transfers Chair/bed transfer  Transfers assist     Chair/bed transfer assist level: Moderate Assistance - Patient 50 - 74%     Locomotion Ambulation   Ambulation assist   Ambulation activity did not occur: Safety/medical concerns  Assist level: Minimal Assistance - Patient > 75% Assistive device: Walker-rolling Max distance: 7'  Walk 10 feet activity   Assist     Assist level: Moderate Assistance - Patient - 50 - 74% Assistive device: Walker-rolling   Walk 50 feet activity   Assist           Walk 150 feet activity   Assist           Walk 10 feet on uneven surface   activity   Assist           Wheelchair     Assist Will patient use wheelchair at discharge?: Yes Type of Wheelchair: Manual Wheelchair activity did not occur: Safety/medical concerns(hypotensive)  Wheelchair assist level: Supervision/Verbal cueing Max wheelchair distance: 50    Wheelchair 50 feet with 2 turns activity    Assist    Wheelchair 50 feet with 2 turns activity did not occur: Safety/medical concerns   Assist Level: Supervision/Verbal cueing   Wheelchair 150 feet activity     Assist Wheelchair 150 feet activity did not occur: Safety/medical concerns   Assist Level: Dependent - Patient 0%      Medical Problem List and Plan: 1.Functional and mobility deficitssecondary to metastatic lung cancer to left clavicle, ribs, L4 vertebral body with fracture. Pt is s/p L4 corpectomy and L3-5 fusion by Dr. Rolena Infante on 06/11/18  Continue CIR- team conf today  Per hematology/oncology, awaiting improvement in function and strength prior to chemotherapy.  Rad-onc involved.  Poor prognosis.Palliative with [place] meeting today Status post I&D of back wound 1/17 -repeat I and D today 2. DVT: dopplers done this evening and patient has DVT in left CFV             IVC filter placement on 1/8  Pt not a candidate for anticoagulation due to hematoma and bleeding riskat present although long term he is high VTE risk 3. Pain Management: dilaudid prn. Unable to tolerate Oxycodone   Fentanyl increased to 25 mcg on 1/17, increased to 50 on 1/21  Appreciate palliative care consult- notes reviewed  Cymbalta added on 1/17, increased on 1/24  Right subacromial and lumbar paraspinal trigger point injected by Ortho on 1/22-notes reviewed  Voltaren gel added on 1/23 with improvement  Relatively controlled on 1/28 Hydrocodone for moderate and dilaudid for severe breakthough pain 4. Mood:LCSW to follow for evaluation and support.  Cymbalta added  Patient more calm when daughter  not present 5. Neuropsych: This patientiscapable of making decisions on hisown behalf. 6. Skin/Wound Care:  VAC in place on back wound per ortho, discussed with WOC, suboptimal healing, plan for I&D tomorrow 7. Fluids/Electrolytes/Nutrition:Monitor I/O.  8.Blood pressure:  Abdominal binder as needed  Relatively controlled on 1/28, orthostatics negative  Monitor with increased mobility 9. ABLA: Continue to monitor with serial checks.   Hemoglobin 9.0 on 1/27  Iron studies consistent with anemia of chronic disease: Ferritin levels high  IV iron 10. Hypokalemia:   Potassium 4.3 on 1/24   Supplement increased on 1/13  Magnesium 2.2 on 1/24   Supplement initiated on 1/14 11. Rash: Continue Clobetasol bidas well as hypoallergenic sheets. 12. Neuropathic pain: Improving on gabapentin 300 mg tid 13. Constipation:Managed with miralax prn--patient wants to manage his bowel program. 14.  Hypoalbuminemia  Supplement initiated on 1/8 15.  Sinus tachycardia  Metoprolol 12.5 twice daily started on 1/13, changed to Coreg on 1/15  Improved 16.  Hyponatremia  Sodium 135 on 1/24  Continue to monitor 17.  Wound infection  WBCs 16.2 on 1/27, trending up, await ID/Ortho recs  Labs ordered for tomorrow  Wound cultures with E. coli, pansensitive  Chest x-ray reviewed with patient, unremarkable for acute infectious process, repeat chest x-ray on 1/16 reviewed showing some lower abnormality-continue to monitor  Afebrile  Appreciate ID recs, notes reviewed- IV ceftezole   Antibiotic beads placed by Ortho in wound  Blood cultures no growth  ESR/CRP elevated on 1/22  Back incision as above 18. Peripheral edema  Lasix 20 daily started on 1/15, increased to twice daily on 1/21 local care and elevation   Improving 19.  Sleep disturbance  Trazodone DC'd, Benadryl ordered for pruritus and sleep on 1/17  Improving. 20.  Pruritus.  Secondary to pain medications  Benadryl as needed, dose  increased on 1/17  Improving 21.  Multiple joint pain  Multifactorial  Hip x-ray with degenerative changes  Right shoulder xray reviewed - degenerative changes with ?atypical area of lucency  LOS: 22 days A FACE TO FACE EVALUATION WAS PERFORMED  Charlett Blake 07/21/2018, 8:09 AM

## 2018-07-21 NOTE — Anesthesia Procedure Notes (Signed)
Procedure Name: Intubation Date/Time: 07/21/2018 2:11 PM Performed by: Candis Shine, CRNA Pre-anesthesia Checklist: Patient identified, Emergency Drugs available, Suction available and Patient being monitored Patient Re-evaluated:Patient Re-evaluated prior to induction Oxygen Delivery Method: Circle System Utilized Preoxygenation: Pre-oxygenation with 100% oxygen Induction Type: IV induction Ventilation: Mask ventilation without difficulty Laryngoscope Size: Mac and 4 Grade View: Grade I Tube type: Oral Tube size: 7.5 mm Number of attempts: 1 Airway Equipment and Method: Stylet Placement Confirmation: ETT inserted through vocal cords under direct vision,  positive ETCO2 and breath sounds checked- equal and bilateral Secured at: 23 cm Tube secured with: Tape Dental Injury: Teeth and Oropharynx as per pre-operative assessment

## 2018-07-21 NOTE — Plan of Care (Signed)
PMT: Spoke with wife and cousin. [name] is in [place]. His procedure was moved up and [place] conversation was not had. They discuss his future and prognosis. They discuss family dynamics. We discussed the living will and MOST form. We discussed quality of life. Hospice and comfort care discussed. Answered all questions.   No charge.

## 2018-07-21 NOTE — Transfer of Care (Signed)
Immediate Anesthesia Transfer of Care Note  Patient: Brian [name]  Procedure(s) Performed: LUMBAR WOUND DEBRIDEMENT (N/A Spine Lumbar)  Patient Location: PACU  Anesthesia Type:General  Level of Consciousness: drowsy  Airway & Oxygen Therapy: Patient Spontanous Breathing and Patient connected to face mask oxygen  Post-op Assessment: Report given to RN and Post -op Vital signs reviewed and stable  Post vital signs: Reviewed and stable  Last Vitals:  Vitals Value Taken Time  BP 101/58 07/21/2018  3:11 PM  Temp    Pulse 88 07/21/2018  3:13 PM  Resp 14 07/21/2018  3:13 PM  SpO2 100 % 07/21/2018  3:13 PM  Vitals shown include unvalidated device data.  Last Pain:  Vitals:   07/21/18 0840  TempSrc:   PainSc: 2       Patients Stated Pain Goal: 0 (20/23/34 3568)  Complications: No apparent anesthesia complications

## 2018-07-21 NOTE — Progress Notes (Signed)
Physical Therapy Session Note  Patient Details  Name: Brian [name] MRN: [id number] Date of Birth: 1946-05-07  Today's Date: 07/21/2018 PT Individual Time: 1100-1200 PT Individual Time Calculation (min): 60 min   Short Term Goals:  Week 3:  PT Short Term Goal 1 (Week 3): pt will move supine> sit with min assist PT Short Term Goal 2 (Week 3): pt will perform slide board trasnfer with min assist 50% of trials PT Short Term Goal 3 (Week 3): pt will perform gait x 10' with LRAD PT Short Term Goal 4 (Week 3): pt will state 3/3 spinal precautions  Skilled Therapeutic Interventions/Progress Updates:  Pt sitting up in w/c; TLSO already donned.   W/c propulsion on level tile x 150' with supervision, using bil LEs, with 2 brief rest breaks.  Pt locks and unlocks brakes with 1 cue.  He is able to direct a care giver with min cues for placing and removing ELRs.  Discussed spinal precautions.  Pt is unable to remember any initially; with demo, he is able to identify movements that break precautions.  Pt is concerned that he cannot remember them; he participated well but appears lethargic, possibly due to pain meds.   Slide board transfer to level mat from w/c, min assist.  From raised mat, 25", pt stood up to RW with min assist.  He stood x 1 minute, wt shifting L><R with CGA and cues to stand as erect as possible.  To transfer mat> w/c to L, pt's wife initially started this.  Pt's cousin, Stanton Kidney, arrived and began training rather than [place].  Min assist transfer.  Stanton Kidney and [place] participated well, but will need further training on transfers and w/c parts mgt.  Pt left resting in w/c with wife and cousin in attendance.     Therapy Documentation Precautions:  Precautions Precautions: Back Precaution Comments: re-educated on BLT, pt cont to be reminded during function Required Braces or Orthoses: Spinal Brace Spinal Brace: Thoracolumbosacral orthotic, Applied in sitting  position Restrictions Weight Bearing Restrictions: No  Pain: Pain Assessment Pain Scale: Faces Pain Score: 2  Faces Pain Scale: Hurts a little bit Pain Type: Acute pain Pain Location: Back Pain Orientation: Lower Pain Descriptors / Indicators: Aching Pain Frequency: Constant Pain Onset: On-going Patients Stated Pain Goal: 0 Pain Intervention(s): Repositioned;Ambulation/increased activity    Therapy/Group: Individual Therapy  Rachelann Enloe 07/21/2018, 12:11 PM

## 2018-07-21 NOTE — Progress Notes (Signed)
Physical Therapy Session Note  Patient Details  Name: Brian [name] MRN: [id number] Date of Birth: 10-24-1945  Today's Date: 07/21/2018 PT Individual Time: 0845-0910 PT Individual Time Calculation (min): 25 min   Short Term Goals: Week 3:  PT Short Term Goal 1 (Week 3): pt will move supine> sit with min assist PT Short Term Goal 2 (Week 3): pt will perform slide board trasnfer with min assist 50% of trials PT Short Term Goal 3 (Week 3): pt will perform gait x 10' with LRAD PT Short Term Goal 4 (Week 3): pt will state 3/3 spinal precautions  Skilled Therapeutic Interventions/Progress Updates:    Patient received in bed, initially reluctant to participate in therapy but able to be convinced with extensive explanation of benefits of therapy even prior to surgery and encouragement from PT and spouse. Able to perform rolling with MinA and sidelying to sit with MinA and extended time today. Abdominal binder and TLSO were applied totalA in sitting at EOB, then performed sliding board transfer into WC with MinA from PT and elevated bed. He was left up in his WC positioned to comfort with all needs met, ready for next attending therapist.   Therapy Documentation Precautions:  Precautions Precautions: Back Precaution Comments: re-educated on BLT, pt cont to be reminded during function Required Braces or Orthoses: Spinal Brace Spinal Brace: Thoracolumbosacral orthotic, Applied in sitting position Restrictions Weight Bearing Restrictions: No General:   Vital Signs:   Pain: Pain Assessment Pain Scale: Faces Pain Score: 2  Faces Pain Scale: Hurts a little bit Pain Type: Acute pain Pain Location: Back Pain Orientation: Lower Pain Descriptors / Indicators: Aching Pain Frequency: Constant Pain Onset: On-going Patients Stated Pain Goal: 0 Pain Intervention(s): Repositioned;Ambulation/increased activity    Therapy/Group: Individual Therapy  Deniece Ree PT, DPT,  CBIS  Supplemental Physical Therapist [facility]    Pager [phone] Acute Rehab Office [phone]   07/21/2018, 10:05 AM

## 2018-07-21 NOTE — Anesthesia Preprocedure Evaluation (Addendum)
Anesthesia Evaluation  Patient identified by MRN, date of birth, ID band Patient awake    Reviewed: Allergy & Precautions, NPO status , Patient's Chart, lab work & pertinent test results  Airway Mallampati: II  TM Distance: >3 FB Neck ROM: Full    Dental no notable dental hx.    Pulmonary COPD, Current Smoker, former smoker,  Metastatic lung cancer Hoarseness  Left base of neck mass   Pulmonary exam normal breath sounds clear to auscultation       Cardiovascular + DVT   Rhythm:Regular Rate:Tachycardia  ECG: Rate 89, Sinus rhythm with frequent and consecutive Premature ventricular complexes   Neuro/Psych negative neurological ROS  negative psych ROS   GI/Hepatic GERD  Medicated and Controlled,(+)     substance abuse  ,   Endo/Other  negative endocrine ROS  Renal/GU negative Renal ROS     Musculoskeletal negative musculoskeletal ROS (+) narcotic dependent  Abdominal   Peds  Hematology  (+) anemia ,   Anesthesia Other Findings Lumbar spine tumor  Reproductive/Obstetrics                           Anesthesia Physical Anesthesia Plan  ASA: IV  Anesthesia Plan: General   Post-op Pain Management:    Induction: Intravenous  PONV Risk Score and Plan: 2 and Ondansetron, Dexamethasone and Treatment may vary due to age or medical condition  Airway Management Planned: Oral ETT  Additional Equipment:   Intra-op Plan:   Post-operative Plan: Extubation in OR  Informed Consent: I have reviewed the patients History and Physical, chart, labs and discussed the procedure including the risks, benefits and alternatives for the proposed anesthesia with the patient or authorized representative who has indicated his/her understanding and acceptance.     Dental advisory given  Plan Discussed with: CRNA  Anesthesia Plan Comments:         Anesthesia Quick Evaluation

## 2018-07-22 ENCOUNTER — Inpatient Hospital Stay (HOSPITAL_COMMUNITY): Payer: Medicare Other

## 2018-07-22 ENCOUNTER — Encounter (HOSPITAL_COMMUNITY): Payer: Self-pay | Admitting: Orthopedic Surgery

## 2018-07-22 DIAGNOSIS — R918 Other nonspecific abnormal finding of lung field: Secondary | ICD-10-CM

## 2018-07-22 DIAGNOSIS — Z515 Encounter for palliative care: Secondary | ICD-10-CM

## 2018-07-22 MED ORDER — IOHEXOL 300 MG/ML  SOLN
75.0000 mL | Freq: Once | INTRAMUSCULAR | Status: AC | PRN
  Administered 2018-07-22: 75 mL via INTRAVENOUS

## 2018-07-22 MED ORDER — ZOLEDRONIC ACID 4 MG/5ML IV CONC
4.0000 mg | Freq: Once | INTRAVENOUS | Status: AC
  Administered 2018-07-22: 4 mg via INTRAVENOUS
  Filled 2018-07-22: qty 5

## 2018-07-22 NOTE — Progress Notes (Addendum)
I stopped by to see Brian [name] this morning.  He actually looked a little bit better than I  would have thought.  His pain seems to be doing better.  He still is not all that active yet.  He is not that mobile.  He had to go back to surgery yesterday for wound debridement of his back.  It sounds like he has to go back again today for wound closure.  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  We will clearly need to start radiation therapy on that before there is any type of fracture of the clavicle.  I spoke with Dr. Sondra Come of radiation oncology yesterday about this.  I do not think it would be a bad idea to get a CT scan of the chest so that we can see how the primary lesion in the right lung is doing.  It will be interesting to see how much this is grown.  I do not believe that he is a candidate for any type of systemic chemotherapy.  However, he might be a candidate for immunotherapy.  A recent phase 3 randomized clinical trial looked at immunotherapy versus chemotherapy for metastatic non-small cell lung cancer.  There is seem to be an improvement in survival with immunotherapy.  I do think that he could tolerate immunotherapy.  I am repeating a prealbumin on him.  His prealbumin 2 weeks ago was only 6.8.  Hopefully this is going up.  At least, he is more comfortable.  He is on a Duragesic patch.  This seems to be helping.  I am grateful for this.  He seems to be eating a little bit better.  Hopefully the prealbumin will reflect this.  This is been a incredibly slow recovery.  I know that he had a very aggressive and major surgery for his back.  Dr. Rolena Infante did a wonderful job with the L4 corpectomy.  It would be nice to see Brian [name] on his feet and ambulatory.  Hopefully, he will start radiation therapy soon to the left clavicle.  He really needs radiation to the lumbar spine but he just cannot have this yet because of the infection with his wound.  I know that he is getting wonderful  care by all the staff in the rehab unit.  I know that they are working very hard with him so that he can have a better quality of life when he goes home.  Lattie Haw, MD  Michaelyn Barter 6:10

## 2018-07-22 NOTE — Progress Notes (Signed)
Occupational Therapy Session Note  Patient Details  Name: Brian [name] MRN: [id number] Date of Birth: Dec 31, 1945  Today's Date: 07/22/2018 OT Individual Time: 0905-1000 OT Individual Time Calculation (min): 55 min    Short Term Goals: Week 1:  OT Short Term Goal 1 (Week 1): Pt will complete transfer to toillet/BSC with mod A using LRAD OT Short Term Goal 1 - Progress (Week 1): Not met OT Short Term Goal 2 (Week 1): Pt will thread B LEs into pants with steadying assisting using AE PRN OT Short Term Goal 2 - Progress (Week 1): Met OT Short Term Goal 3 (Week 1): Pt will tolerate standing at sink to complete 1 grooming task in standing with steadying assist OT Short Term Goal 3 - Progress (Week 1): Not met  Skilled Therapeutic Interventions/Progress Updates:    1;1. Pt received in bed. Pt completes supine>EOB with MAX A overall. OT completes LB dressing and teaches wife way to don brief with EOB without doffing shorts, or shoes. Wife appreciative. OT dons teds, abdominal binder and TLSO at EOB. Pt able to sit to stand from elevated bed with mod A and wife advances pants past hips. Pt slide board transfer to w/c with min A for steadying equipment and placing board. Pt grooms at sink with set up. Exited session with pt seated in w/c, call light in reach and wife present in room  Therapy Documentation Precautions:  Precautions Precautions: Back Precaution Comments: re-educated on BLT, pt cont to be reminded during function Required Braces or Orthoses: Spinal Brace Spinal Brace: Thoracolumbosacral orthotic, Applied in sitting position Restrictions Weight Bearing Restrictions: No General:   Vital Signs:  Pain: Pain Assessment Pain Scale: 0-10 Pain Score: 7  Pain Type: Surgical pain Pain Location: Back Pain Descriptors / Indicators: Aching Pain Onset: On-going Pain Intervention(s): Medication (See eMAR) ADL:   Vision   Perception    Praxis   Exercises:   Other  Treatments:     Therapy/Group: Individual Therapy  Lowella Dell Ethie Curless 07/22/2018, 10:00 AM

## 2018-07-22 NOTE — Plan of Care (Signed)
  Problem: RH PAIN MANAGEMENT Goal: RH STG PAIN MANAGED AT OR BELOW PT'S PAIN GOAL Description Less than 3,on 1 to 10 scale.  Outcome: Not Progressing; c/o constant pain

## 2018-07-22 NOTE — Progress Notes (Signed)
Made attempt to give pt scheduled medications. However, after scanning and attempting to administer medications pt refused. His daughters are at the bedside and witnessed pt refuse medications.  Medications were wasted. Approximately 5- 10 mins after leaving the room. Pts daughter came to nursing station and asked if I could try to give pain medication again. I explained to her that I would if pt needs it & agrees to take the medication.  Also observed pt has 2 medicine cups with medications on bedside table. 1 cup contains pro stat and other cup contains 2 pills. When asked pt stated they were from day shift and that he was waiting to take them.

## 2018-07-22 NOTE — Progress Notes (Signed)
Physical Therapy Weekly Progress Note  Patient Details  Name: Brian [name] MRN: [id number] Date of Birth: [birth year]  Beginning of progress report period: 07/15/18 End of progress report period: 07/22/18  Today's Date: 07/22/2018 PT Individual Time: 6184-8592 PT Individual Time Calculation (min): 30 min   Patient has met 2 of 4 short term goals.  Pt is unable to remember back precautions.  He has not attempted gait in the last week.  Patient continues to demonstrate the following deficits muscle weakness and muscle joint tightness, decreased cardiorespiratoy endurance and decreased oxygen support, decreased awareness, decreased problem solving, decreased safety awareness and decreased memory and decreased sitting balance, decreased standing balance, decreased postural control, decreased balance strategies and difficulty maintaining precautions and therefore will continue to benefit from skilled PT intervention to increase functional independence with mobility.  Patient progressing toward long term goals..  Continue plan of care.  PT Short Term Goals   Week 3:  PT Short Term Goal 1 (Week 3): pt will move supine> sit with min assist PT Short Term Goal 1 - Progress (Week 3): Met PT Short Term Goal 2 (Week 3): pt will perform slide board trasnfer with min assist 50% of trials PT Short Term Goal 2 - Progress (Week 3): Met PT Short Term Goal 3 (Week 3): pt will perform gait x 10' with LRAD PT Short Term Goal 3 - Progress (Week 3): Not met PT Short Term Goal 4 (Week 3): pt will state 3/3 spinal precautions PT Short Term Goal 4 - Progress (Week 3): Not met  Week 4= LTGs due to ELOS  Skilled Therapeutic Interventions/Progress Updates:  Pt seated in w/c, 2L O2 via [place].  [name] and cousin Stanton Kidney here for further family ed.  Mary transferred pt via SB, level from w/c> mat to L, with contact guard assist after placement of SB by [facility].  Stanton Kidney needs more practice with w/c parts mgt, positioning  of w/c, her body positioning relative to pt's. Pt stood up with min assist from slightly raised mat, to RW .  He played Connect 4 game  with [facility] in standing, x 1 min 10 seconds, x 2 with 1UE support as he used the other hand to move pieces.   He won 1/2 games.    Family took pt back to room and will call for staff to help him get back to bed.     Therapy Documentation Precautions:  Precautions Precautions: Back Precaution Comments: re-educated on BLT, pt cont to be reminded during function Required Braces or Orthoses: Spinal Brace Spinal Brace: Thoracolumbosacral orthotic, Applied in sitting position Restrictions Weight Bearing Restrictions: No  Pain: Pain Assessment Pain Scale: 0-10 Pain Score: 4  Pain Type: Surgical pain Pain Location: Back Pain Orientation: Mid Pain Descriptors / Indicators: Aching Pain Frequency: Constant Pain Onset: On-going Pain Intervention(s): Medication (See eMAR)   Therapy/Group: Individual Therapy  Kayti Poss 07/22/2018, 4:07 PM

## 2018-07-22 NOTE — Progress Notes (Signed)
Social Work Patient ID: Brian [name], male   DOB: August 31, 1945, 73 y.o.   MRN: [id number] Met with pt and wife to discuss team conference and questions did come up for extension and team wanted to wait until next team conference-Wed. Pt may need more surgery and wife feels with the loss days of therapy he will need more time. She is working on getting a ramp and have given her a list of private duty, home health and NH's. Wife will look into hiring assist she is aware it would be too much for her at home with him 24 hr. Pt concerned about how hoarse he is and wants someone to address this. Will make MD aware. Continue to work on best plan for pt, both want him at home.

## 2018-07-22 NOTE — Anesthesia Postprocedure Evaluation (Signed)
Anesthesia Post Note  Patient: Brian [name]  Procedure(s) Performed: LUMBAR WOUND DEBRIDEMENT (N/A Spine Lumbar)     Patient location during evaluation: PACU Anesthesia Type: General Level of consciousness: awake and alert Pain management: pain level controlled Vital Signs Assessment: post-procedure vital signs reviewed and stable Respiratory status: spontaneous breathing, nonlabored ventilation, respiratory function stable and patient connected to nasal cannula oxygen Cardiovascular status: blood pressure returned to baseline and stable Postop Assessment: no apparent nausea or vomiting Anesthetic complications: no    Last Vitals:  Vitals:   07/21/18 1951 07/22/18 0510  BP: 102/61 112/68  Pulse: 87 81  Resp:  20  Temp: 36.9 C (!) 36.3 C  SpO2: 94% 99%    Last Pain:  Vitals:   07/22/18 0904  TempSrc:   PainSc: 4                  Montez Hageman

## 2018-07-22 NOTE — Patient Care Conference (Signed)
Inpatient RehabilitationTeam Conference and Plan of Care Update Date: 07/21/2018   Time: 10:44 AM    Patient Name: Brian [name]      Medical Record Number: [id number]  Date of Birth: [birth year] Sex: Male         Room/Bed: 4M10C/4M10C-01 Payor Info: Payor: MEDICARE / Plan: MEDICARE PART A AND B / Product Type: *No Product type* /    Admitting Diagnosis: metastatic lung cancer  Admit Date/Time:  06/29/2018  2:39 PM Admission Comments: No comment available   Primary Diagnosis:  <principal problem not specified> Principal Problem: <principal problem not specified>  Patient Active Problem List   Diagnosis Date Noted  . Pain   . Pain aggravated by physical activity   . Right hip pain   . Sleep disturbance   . Peripheral edema   . Leukocytosis   . Hyponatremia   . Sinus tachycardia   . Hypomagnesemia   . Orthostatic hypotension   . Labile blood pressure   . Cancer associated pain   . DVT of lower extremity (deep venous thrombosis) ([place])   . Malignant neoplasm of lung ([place])   . Hypoalbuminemia due to protein-calorie malnutrition ([place])   . Acute blood loss anemia   . Postoperative pain   . Pathological compression fracture of lumbar vertebra ([place]) 06/29/2018  . Metastatic lung cancer (metastasis from lung to other site) ([place]) 06/28/2018  . Rash 06/25/2018  . Hypokalemia 06/25/2018  . Normocytic anemia 06/25/2018  . S/P lumbar fusion 06/24/2018  . Palliative care by specialist   . Cancer related pain   . Lung cancer, primary, with metastasis from lung to other site, right ([place]) 06/14/2018  . Squamous cell lung cancer, right ([place]) 06/14/2018  . Lung cancer metastatic to bone ([place]) 06/14/2018  . Bronchitis due to tobacco use 06/07/2018  . Lumbar spine tumor 06/04/2018    Expected Discharge Date: Expected Discharge Date: 07/30/18  Team Members Present: Physician leading conference: Dr. Alysia Penna Social Worker Present: Ovidio Kin, LCSW Nurse Present: Dorien Chihuahua, RN PT Present: Georjean Mode, PT OT Present: Amy Rounds, OT SLP Present: Stormy Fabian, SLP PPS Coordinator present : Ileana Ladd, PT     Current Status/Progress Goal Weekly Team Focus  Medical   Lumbar incision requiring I and D, palliative trying to establish goals of care, metastatic lesion Right clavicle enlarging  increase mobility, improve pain  D/C planning   Bowel/Bladder   continent of bowel and bladder LBM 1-28  Remain continent of bowel and bladder  Assist with toileting needs prn   Swallow/Nutrition/ Hydration             ADL's   Min A basic sliding board transfers; Supervision UB bathing/dressing; max- total A LB bathing/dressing; +2 toileting. Per wife and pt's request, wants OT to address strengthening/endurance for transfers and not ADL tasks at this time  Downgraded to max A overall  Family training, functional transfers, activity tolerance, d/c planning   Mobility   mod assist bed and level SBT, w/c x 100' using bil LEs, has not stood in several days  min assist bed and basic transfers, mod assist car transfer, w/c prop x 150 '    activity tolerance, pain mgt, mobility and locomotion, pt and family ed   Communication             Safety/Cognition/ Behavioral Observations            Pain   pain managed with prn dilaudid and norco  <2  Assess pain q shirt and prn   Skin   back incision with wound vac, scheduled for I/D 07-21-18  no new skin issues, healing back incison  Assess skin q shift and prn      *See Care Plan and progress notes for long and short-term goals.     Barriers to Discharge  Current Status/Progress Possible Resolutions Date Resolved   Physician    Medical stability;Pending chemo/radiation;Pending surgery     progressing except with ambulation  coordinate with Ortho , onc, and rad/onc      Nursing                  PT                    OT                  SLP                SW                Discharge Planning/Teaching  Needs:  Pt and wife want to go home but want it to be managable. Wife has been doing some hands on care with pt and beginning to learn his care. I & D today according to wife      Team Discussion:  Making slow progress in therapies, starting to stand and using a transfer board. Pian better controlled. I & D today. Flomax for voiding. Activity tolerance better but he struggles with this. Neuro-psych seeing for coping and support. Both want discharge extended due to progress in therapies, team wants to wait until next week's conference. Beginning hands on education with wife and ramp being started.  Revisions to Treatment Plan:  DC 2/7 ?        I attest that I was present, lead the team conference, and concur with the assessment and plan of the team.   Elease Hashimoto 07/22/2018, 10:44 AM

## 2018-07-22 NOTE — Progress Notes (Signed)
Daily Progress Note   Patient Name: Brian [name]       Date: 07/22/2018 DOB: 1945-08-16  Age: 73 y.o. MRN#: [id number] Attending Physician: Jamse Arn, MD Primary Care Physician: [name], MD Admit Date: 06/29/2018  Reason for Consultation/Follow-up: Establishing goals of care  Subjective: Patient awake, alert, oriented and able to participate in conversation.   GOC:   Extensive time spent with patient, wife, and cousin this afternoon. Spoke with patient alone about goals of care. Then further discussed with his life.   Discussed course of hospitalization including diagnoses, interventions, progression with rehab, and prognosis.   Patient appropriately speaks of his understanding that his cancer is not curable. Dr. Marin Olp told him prognosis of 1-1.5 years, "if that" he states. We discussed plan for radiation for pain management and possible immunotherapy if his performance status allows. He understands he is too weak for chemotherapy. He further states "when you are dying, you aren't hungry."   Further explored his conversations with the care team regarding advance directive and decisions regarding life-prolonging interventions. He shares that he has spoke to wife and daughter on separate occasions, further explaining that wife is more understanding of his wishes. Introduced MOST form and asked Brian [name] his thoughts on resuscitation, life support, feeding tubes, etc. Brian [name] shares that he does NOT want life prolonging interventions when he is nearing EOL including no resuscitation, ventilator, or feeding tube. He speaks of not wanting his suffering prolonged. Asked Brian [name] if he is ready to discuss these decisions and MOST form with his family including wife and daughter (from a  previous marriage). Brian [name] is agreeable to arrange meeting to discuss limitations to care with family.   Brian [name] wishes for his wife Brian [name]) as primary HCPOA and daughter Brian [name]) as secondary HCPOA. Outside of the room, Brian [name] and cousin Brian [name] speak of concern that Brian [name] could not emotionally handle making decisions for Brian [name] in a crisis and would wish for all life-prolonging interventions to be done. They are worried that Brian [name] will make him more anxious during this conversation. Emotional support provided and further explained the importance of having this conversation with Brian [name] now while her father is awake, alert, oriented and able to make decisions for himself.   Patient wishes for wife, daughter, and cousin to be present during follow-up meeting. We have  scheduled for tomorrow, 07/23/18 at [place] is arranging an attorney to complete living will with them but interested in completed MOST form with Brian [name] and family.   Therapeutic listening and emotional/spiritual support provided. Patient/wife hopeful that he will progress with therapy with the goal of returning home if safe.  Wife has PMT contact information.   Length of Stay: 23  Current Medications: Scheduled Meds:  . carvedilol  6.25 mg Oral BID WC  . clobetasol ointment   Topical BID  . diclofenac sodium  2 g Topical QID  . DULoxetine  60 mg Oral Daily  . feeding supplement (PRO-STAT SUGAR FREE 64)  30 mL Oral TID WC & HS  . fentaNYL  1 patch Transdermal Q72H  . furosemide  20 mg Oral BID  . gabapentin  300 mg Oral TID  . guaiFENesin  10 mL Oral Q6H  . hydrocerin   Topical BID  . HYDROcodone-acetaminophen  1 tablet Oral QHS  . magnesium oxide  200 mg Oral Daily  . methocarbamol  750 mg Oral QID  . pantoprazole  40 mg Oral Daily  . potassium chloride  30 mEq Oral BID  . senna-docusate  2 tablet Oral QPC supper  . tamsulosin  0.4 mg Oral QPC supper  . vitamin C  500 mg Oral BID  . zinc sulfate  220 mg Oral BID      Continuous Infusions: . sodium chloride Stopped (07/22/18 0610)  .  ceFAZolin (ANCEF) IV 2 g (07/22/18 1426)  . lactated ringers Stopped (07/21/18 1930)    PRN Meds: sodium chloride, camphor-menthol, diphenhydrAMINE, heparin lock flush, HYDROcodone-acetaminophen, HYDROmorphone HCl, menthol-cetylpyridinium **OR** phenol, polyethylene glycol, prochlorperazine **OR** prochlorperazine **OR** prochlorperazine, sodium chloride flush, sodium phosphate  Physical Exam Vitals signs and nursing note reviewed.  Constitutional:      Appearance: He is cachectic. He is ill-appearing.  HENT:     Head: Normocephalic and atraumatic.  Pulmonary:     Effort: No tachypnea, accessory muscle usage or respiratory distress.  Abdominal:     Tenderness: There is no abdominal tenderness.  Skin:    General: Skin is warm and dry.     Coloration: Skin is pale.  Neurological:     Mental Status: He is alert and oriented to person, place, and time.  Psychiatric:        Attention and Perception: Attention normal.        Mood and Affect: Mood normal.        Speech: Speech normal.        Behavior: Behavior is cooperative.        Cognition and Memory: Cognition normal.            Vital Signs: BP 112/68 (BP Location: Left Arm)   Pulse 81   Temp (!) 97.4 F (36.3 C) (Oral)   Resp 20   Ht 5\' 10"  (1.778 m)   Wt 85.4 kg   SpO2 99%   BMI 27.01 kg/m  SpO2: SpO2: 99 % O2 Device: O2 Device: Nasal Cannula O2 Flow Rate: O2 Flow Rate (L/min): 3 L/min  Intake/output summary:   Intake/Output Summary (Last 24 hours) at 07/22/2018 1448 Last data filed at 07/22/2018 1357 Gross per 24 hour  Intake 1062 ml  Output 670 ml  Net 392 ml   LBM: Last BM Date: 07/21/18 Baseline Weight: Weight: 96.8 kg Most recent weight: Weight: 85.4 kg       Palliative Assessment/Data: PPS 40%     Patient Active Problem  List   Diagnosis Date Noted  . Pain   . Pain aggravated by physical activity   . Right hip pain   .  Sleep disturbance   . Peripheral edema   . Leukocytosis   . Hyponatremia   . Sinus tachycardia   . Hypomagnesemia   . Orthostatic hypotension   . Labile blood pressure   . Cancer associated pain   . DVT of lower extremity (deep venous thrombosis) ([place])   . Malignant neoplasm of lung ([place])   . Hypoalbuminemia due to protein-calorie malnutrition ([place])   . Acute blood loss anemia   . Postoperative pain   . Pathological compression fracture of lumbar vertebra ([place]) 06/29/2018  . Metastatic lung cancer (metastasis from lung to other site) ([place]) 06/28/2018  . Rash 06/25/2018  . Hypokalemia 06/25/2018  . Normocytic anemia 06/25/2018  . S/P lumbar fusion 06/24/2018  . Palliative care by specialist   . Cancer related pain   . Lung cancer, primary, with metastasis from lung to other site, right ([place]) 06/14/2018  . Squamous cell lung cancer, right ([place]) 06/14/2018  . Lung cancer metastatic to bone ([place]) 06/14/2018  . Bronchitis due to tobacco use 06/07/2018  . Lumbar spine tumor 06/04/2018    Palliative Care Assessment & Plan   Patient Profile: 73 y.o. male  with past medical history of rheumatoid arthritis admitted on 06/04/2018 with low back pain, radicular pain into right buttocks and leg, and rib/left shoulder pain. CT myelogram ordered outpatient and revealed L4 pathologic compression fracture with lytic destruction and extraosseous tumor encroaching into spinal canal and L4-L5 intervertebral foramina. Seen in outpatient ortho office and decision made for direct admission for further workup. 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 Patient s/p LV corpectomy on 12/20 and L3-L5 spinal fusion on 12/22. Hospitalization and course of rehab complicated by left DVT and surgical site wound infection. Oncology following.   Assessment: Metastatic lung cancer  L4 vertebral body fracture s/p L4 corpectomy and L3-5 fusion Left DVT Cancer related  pain Lumbar wound infection s/p I&D and wound vac  Recommendations/Plan:  Continue current plan of care and interventions.   Patient does have a good understanding that his cancer is terminal and incurable. He is hopeful to progress with therapy with goal to return home. He is interested in immunotherapy if offered by oncology. He is agreeable for palliative radiation.   Discussed patient's wishes regarding life-prolonging interventions. [name] does not wish for life-prolonging interventions at the end of his life. He is considering DNR/DNI code status. He is interested in further discussing and completing MOST form with PMT provider and family tomorrow. Meeting scheduled for 07/23/18 at 11am.    Wife arranging attorney to visit inpatient to complete living will documentation with her husband.  Goals of Care and Additional Recommendations:  Limitations on Scope of Treatment: Full Scope Treatment  Code Status: FULL   Code Status Orders  (From admission, onward)         Start     Ordered   06/29/18 1526  Full code  Continuous     06/29/18 1525        Code Status History    Date Active Date Inactive Code Status Order ID Comments User Context   06/11/2018 2031 06/29/2018 1440 Full Code [id number]  Milagros Loll, MD Inpatient   06/11/2018 2011 06/11/2018 2030 Full Code [id number]  Melina Schools, MD Inpatient   06/04/2018 1344 06/11/2018 2011 Full  Code [id number]  Ardeen Jourdain, PA-C Inpatient       Prognosis:   Poor prognosis with metastatic lung cancer, prolonged hospitalization/rehab stay secondary to DVT and wound infection, ongoing cancer related pain.   Discharge Planning:  To Be Determined   Care plan was discussed with patient, wife, cousin  Thank you for allowing the Palliative Medicine Team to assist in the care of this patient.   Time In: 1315 Time Out: 1445 Total Time 90 Prolonged Time Billed yes      Greater than 50%  of this time was spent  counseling and coordinating care related to the above assessment and plan.  Ihor Dow, FNP-C Palliative Medicine Team  Phone: [phone] Fax: [phone]  Please contact Palliative Medicine Team phone at [phone] for questions and concerns.

## 2018-07-22 NOTE — Progress Notes (Signed)
Pt currently asleep. Family at bedside requesting that medications be given later tonight.

## 2018-07-22 NOTE — Progress Notes (Signed)
[facility] PHYSICAL MEDICINE & REHABILITATION PROGRESS NOTE  Subjective/Complaints:   Appreciate ortho note, as well as Onc note, CT chest ordered, concerns about expanding clavicular met Discussed with pt.   Per PT did better sit to stand yesterday pre op   ROS: Denies CP, shortness of breath, nausea, vomiting, diarrhea.  Objective: Vital Signs: Blood pressure 112/68, pulse 81, temperature (!) 97.4 F (36.3 C), temperature source Oral, resp. rate 20, height '5\' 10"'$  (1.778 m), weight 85.4 kg, SpO2 99 %. No results found. Recent Labs    07/21/18 0230  WBC 15.9*  HGB 8.8*  HCT 29.0*  PLT 331   Recent Labs    07/21/18 0230  NA 132*  K 3.9  CL 97*  CO2 25  GLUCOSE 123*  BUN 16  CREATININE 0.51*  CALCIUM 8.3*    Physical Exam: BP 112/68 (BP Location: Left Arm)   Pulse 81   Temp (!) 97.4 F (36.3 C) (Oral)   Resp 20   Ht '5\' 10"'$  (1.778 m)   Wt 85.4 kg   SpO2 99%   BMI 27.01 kg/m  Constitutional: No distress . Vital signs reviewed. HENT: Normocephalic.  Atraumatic. Eyes: EOMI. No discharge. Cardiovascular: RRR.  No JVD. Respiratory: CTA bilaterally.  Normal effort.  + [facility]: BS +. Non-distended. Musculoskeletal: Bilateral lower extremity edema, left greater than right, improving Neurological: He is alert and oriented  Dysphonia Able to follow basic commands without difficulty.  Motor:  Right lower extremity: 3+/5 proximal distal, pain inhibition, improving Left lower extremity: Hip flexion 3-/5, knee extension 4-/5, ankle dorsiflexion 4/5, some pain inhibition, improving Skin: Warm and dry. Intact.  Back incision with VAC Psychiatric: Flat  Assessment/Plan: 1. Functional deficits secondary to lung cancer with mets to ribs, vertebrae with oncological fracture status post lumbar fusion which require 3+ hours per day of interdisciplinary therapy in a comprehensive inpatient rehab setting.  Physiatrist is providing close team supervision and 24 hour  management of active medical problems listed below.  Physiatrist and rehab team continue to assess barriers to discharge/monitor patient progress toward functional and medical goals  Care Tool:  Bathing  Bathing activity did not occur: Safety/medical concerns(Unable to assess 2/2 drop in BP with upright positioning) Body parts bathed by patient: Right arm, Left arm, Chest, Abdomen, Front perineal area, Right upper leg, Left upper leg, Face   Body parts bathed by helper: Right lower leg, Left lower leg, Buttocks     Bathing assist Assist Level: Moderate Assistance - Patient 50 - 74%     Upper Body Dressing/Undressing Upper body dressing Upper body dressing/undressing activity did not occur (including orthotics): Safety/medical concerns(B/P issues ) What is the patient wearing?: Orthosis Orthosis activity level: Performed by helper  Upper body assist Assist Level: Total Assistance - Patient < 25%    Lower Body Dressing/Undressing Lower body dressing      What is the patient wearing?: Pants, Incontinence brief     Lower body assist Assist for lower body dressing: 2 Helpers     Toileting Toileting Toileting Activity did not occur (Clothing management and hygiene only): Safety/medical concerns(Unable to assess 2/2 drop in BP with upright positioning)  Toileting assist Assist for toileting: 2 Helpers     Transfers Chair/bed transfer  Transfers assist     Chair/bed transfer assist level: Minimal Assistance - Patient > 75%     Locomotion Ambulation   Ambulation assist   Ambulation activity did not occur: Safety/medical concerns  Assist level: Minimal Assistance -  Patient > 75% Assistive device: Walker-rolling Max distance: 23'   Walk 10 feet activity   Assist     Assist level: Moderate Assistance - Patient - 50 - 74% Assistive device: Walker-rolling   Walk 50 feet activity   Assist           Walk 150 feet activity   Assist           Walk  10 feet on uneven surface  activity   Assist           Wheelchair     Assist Will patient use wheelchair at discharge?: Yes Type of Wheelchair: Manual Wheelchair activity did not occur: Safety/medical concerns(hypotensive)  Wheelchair assist level: Supervision/Verbal cueing Max wheelchair distance: 150    Wheelchair 50 feet with 2 turns activity    Assist    Wheelchair 50 feet with 2 turns activity did not occur: Safety/medical concerns   Assist Level: Supervision/Verbal cueing   Wheelchair 150 feet activity     Assist Wheelchair 150 feet activity did not occur: Safety/medical concerns   Assist Level: Supervision/Verbal cueing      Medical Problem List and Plan: 1.Functional and mobility deficitssecondary to metastatic lung cancer to left clavicle, ribs, L4 vertebral body with fracture. Pt is s/p L4 corpectomy and L3-5 fusion by Dr. Rolena Infante on 06/11/18  Continue CIR- team conf today  Per hematology/oncology, awaiting improvement in function and strength prior to chemotherapy.  Rad-onc involved.  Poor prognosis.Palliative with Center meeting today Status post I&D of back wound 1/17 -repeat I and D with wound vac placement POD #1 2. DVT: dopplers done this evening and patient has DVT in left CFV             IVC filter placement on 1/8  Pt not a candidate for anticoagulation due to hematoma and bleeding riskat present although long term he is high VTE risk 3. Pain Management: dilaudid prn. Unable to tolerate Oxycodone   Fentanyl increased to 25 mcg on 1/17, increased to 50 on 1/21- cont current dose  Appreciate palliative care consult- notes reviewed  Cymbalta added on 1/17, increased on 1/24  Right subacromial and lumbar paraspinal trigger point injected by Ortho on 1/22-notes reviewed  Voltaren gel added on 1/23 with improvement  Relatively controlled on 1/28 Hydrocodone for moderate and dilaudid for severe breakthough pain 4. Mood:LCSW to follow for  evaluation and support.  Cymbalta added  Patient more calm when daughter not present 5. Neuropsych: This patientiscapable of making decisions on hisown behalf. 6. Skin/Wound Care:  VAC in place on back wound per ortho, discussed with WOC, suboptimal healing, plan for I&D tomorrow 7. Fluids/Electrolytes/Nutrition:Monitor I/O.  8.Blood pressure:  Abdominal binder as needed  Relatively controlled on 1/28, orthostatics negative  Monitor with increased mobility 9. ABLA: Continue to monitor with serial checks.   Hemoglobin 9.0 on 1/27  Iron studies consistent with anemia of chronic disease: Ferritin levels high  IV iron 10. Hypokalemia:   Potassium 4.3 on 1/24   Supplement increased on 1/13  Magnesium 2.2 on 1/24   Supplement initiated on 1/14 11. Rash: Continue Clobetasol bidas well as hypoallergenic sheets. 12. Neuropathic pain: Improving on gabapentin 300 mg tid 13. Constipation:Managed with miralax prn--patient wants to manage his bowel program. 14.  Hypoalbuminemia  Supplement initiated on 1/8 15.  Sinus tachycardia  Metoprolol 12.5 twice daily started on 1/13, changed to Coreg on 1/15  Improved 16.  Hyponatremia  Sodium 135 on 1/24  Continue to monitor 17.  Wound infection  WBCs 16.2 on 1/27, trending up, await ID/Ortho recs  Labs ordered for tomorrow  Wound cultures with E. coli, pansensitive  Chest x-ray reviewed with patient, unremarkable for acute infectious process, repeat chest x-ray on 1/16 reviewed showing some lower abnormality-continue to monitor  Afebrile  Appreciate ID recs, notes reviewed- IV ceftezole   Antibiotic beads placed by Ortho in wound  Blood cultures no growth  ESR/CRP elevated on 1/22  Back incision as above 18. Peripheral edema  Lasix 20 daily started on 1/15, increased to twice daily on 1/21 local care and elevation   Improving 19.  Sleep disturbance  Trazodone DC'd, Benadryl ordered for pruritus and sleep on 1/17  Improving.  21.   Multiple joint pain    Right shoulder due to clavicle met- CHest CT should image this today LOS: 23 days A FACE TO FACE EVALUATION WAS PERFORMED  Charlett Blake 07/22/2018, 8:05 AM

## 2018-07-22 NOTE — Progress Notes (Signed)
Physical Therapy Session Note  Patient Details  Name: Brian [name] MRN: [id number] Date of Birth: [birth year]  Today's Date: 07/22/2018 PT Individual Time: 1430-1500 PT Individual Time Calculation (min): 30 min   Short Term Goals: Week 4:  PT Short Term Goal 1 (Week 4): = LTGs due to ELOS  Skilled Therapeutic Interventions/Progress Updates:    Patient seen after meeting with palliative care nurse for in bed therex at his request due to back pain this pm.  Performed ankle pumps, heel slides, hip abduction hooklying with orange band ,SLR, bridging, hip adductor squeezes and UE theraband hoizontal abduction, horizontal adduction and flexion all x 10 each. Patient refused LE ergometer in bed due to back pain.  Left with wife in room and call bell in reach.  Therapy Documentation Precautions:  Precautions Precautions: Back Precaution Comments: re-educated on BLT, pt cont to be reminded during function Required Braces or Orthoses: Spinal Brace Spinal Brace: Thoracolumbosacral orthotic, Applied in sitting position Restrictions Weight Bearing Restrictions: No General: PT Amount of Missed Time (min): 30 Minutes PT Missed Treatment Reason: Other (Comment)(meeting with palliative care practitioner) Pain: Pain Assessment Faces Pain Scale: Hurts even more Pain Type: Surgical pain Pain Location: Back Pain Descriptors / Indicators: Aching;Constant Pain Onset: On-going Pain Intervention(s): Rest    Therapy/Group: Individual Therapy  Reginia Naas  [place], [place] 07/22/2018  07/22/2018, 5:20 PM

## 2018-07-23 ENCOUNTER — Inpatient Hospital Stay (HOSPITAL_COMMUNITY): Payer: Medicare Other

## 2018-07-23 ENCOUNTER — Inpatient Hospital Stay (HOSPITAL_COMMUNITY): Payer: Medicare Other | Admitting: Physical Therapy

## 2018-07-23 LAB — CBC
HCT: 30.8 % — ABNORMAL LOW (ref 39.0–52.0)
Hemoglobin: 8.8 g/dL — ABNORMAL LOW (ref 13.0–17.0)
MCH: 26.3 pg (ref 26.0–34.0)
MCHC: 28.6 g/dL — ABNORMAL LOW (ref 30.0–36.0)
MCV: 91.9 fL (ref 80.0–100.0)
PLATELETS: 351 10*3/uL (ref 150–400)
RBC: 3.35 MIL/uL — ABNORMAL LOW (ref 4.22–5.81)
RDW: 15.8 % — ABNORMAL HIGH (ref 11.5–15.5)
WBC: 19.5 10*3/uL — AB (ref 4.0–10.5)
nRBC: 0 % (ref 0.0–0.2)

## 2018-07-23 LAB — COMPREHENSIVE METABOLIC PANEL
ALT: 10 U/L (ref 0–44)
ANION GAP: 9 (ref 5–15)
AST: 17 U/L (ref 15–41)
Albumin: 1.8 g/dL — ABNORMAL LOW (ref 3.5–5.0)
Alkaline Phosphatase: 125 U/L (ref 38–126)
BUN: 13 mg/dL (ref 8–23)
CO2: 28 mmol/L (ref 22–32)
Calcium: 8.1 mg/dL — ABNORMAL LOW (ref 8.9–10.3)
Chloride: 101 mmol/L (ref 98–111)
Creatinine, Ser: 0.55 mg/dL — ABNORMAL LOW (ref 0.61–1.24)
GFR calc Af Amer: >60 mL/min (ref >60)
GFR calc non Af Amer: >60 mL/min (ref >60)
Glucose, Bld: 93 mg/dL (ref 70–99)
Potassium: 3.8 mmol/L (ref 3.5–5.1)
Sodium: 138 mmol/L (ref 135–145)
Total Bilirubin: 0.8 mg/dL (ref 0.3–1.2)
Total Protein: 5.5 g/dL — ABNORMAL LOW (ref 6.5–8.1)

## 2018-07-23 LAB — PREALBUMIN: Prealbumin: 7.2 mg/dL — ABNORMAL LOW (ref 18–38)

## 2018-07-23 MED ORDER — SODIUM CHLORIDE 0.9 % IV SOLN
1.0000 g | INTRAVENOUS | Status: DC
  Filled 2018-07-23: qty 10

## 2018-07-23 MED ORDER — DIPHENHYDRAMINE HCL 25 MG PO CAPS
25.0000 mg | ORAL_CAPSULE | Freq: Four times a day (QID) | ORAL | Status: DC | PRN
  Filled 2018-07-23: qty 1

## 2018-07-23 MED ORDER — SODIUM CHLORIDE 0.9 % IV SOLN
2.0000 g | INTRAVENOUS | Status: DC
  Administered 2018-07-23 – 2018-07-30 (×8): 2 g via INTRAVENOUS
  Filled 2018-07-23 (×9): qty 20

## 2018-07-23 NOTE — Progress Notes (Signed)
Occupational Therapy Session Note  Patient Details  Name: Brian [name] MRN: [id number] Date of Birth: [birth year]  Today's Date: 07/23/2018 OT Individual Time: 1500-1535 OT Individual Time Calculation (min): 35 min    Short Term Goals: Week 2:  OT Short Term Goal 1 (Week 2): Pt will consistently complete basic sliding board transfers with min A OT Short Term Goal 1 - Progress (Week 2): Not met OT Short Term Goal 2 (Week 2): Pt will complete consecutive toilet transfers as  well as toileting task with +1 assist only in order to reduce caregiver burden OT Short Term Goal 2 - Progress (Week 2): Not met OT Short Term Goal 3 (Week 2): Pt will stand to complete one grooming task in order to increase functional activity tolerance OT Short Term Goal 3 - Progress (Week 2): Not met OT Short Term Goal 4 (Week 2): Pt will consistently complete LB dressing task with +1 assist without use of lift equipment in prep for d/c  OT Short Term Goal 4 - Progress (Week 2): Not met  Skilled Therapeutic Interventions/Progress Updates:    1;1. Pt, family and RN in room attempting to deliver medications. OT and RN problem solved positioning in bed ultimately having pt roll sidelying with tactile cues at arm to facilitate roll with min A, using hospital bed features in trendelenburg, and scooting pt up in bed for improved positioning for consuming meds and food. Pt reuires significant amount of time to roll partially back into back to elevate HOB >30* for PO intake. OT places sign for new no bicep curl precaution and functional applications for nursing staff when completing bed mobility/positioning. Family understands new precaution. Exited session with pt seated in room after consuming 50% of meal. Wife pleased with pt intake.   Therapy Documentation Precautions:  Precautions Precautions: Back Precaution Comments: re-educated on BLT, pt cont to be reminded during function Required Braces or Orthoses: Spinal  Brace Spinal Brace: Thoracolumbosacral orthotic, Applied in sitting position Restrictions Weight Bearing Restrictions: No General: General OT Amount of Missed Time: 60 Minutes Vital Signs: Therapy Vitals Pulse Rate: 96 Resp: 12 BP: 99/60 Patient Position (if appropriate): Lying Oxygen Therapy SpO2: 97 % O2 Device: Nasal Cannula O2 Flow Rate (L/min): 2 L/min Pain:   ADL:   Vision   Perception    Praxis   Exercises:   Other Treatments:     Therapy/Group: Individual Therapy  Tonny Branch 07/23/2018, 3:56 PM

## 2018-07-23 NOTE — Progress Notes (Signed)
    Subjective: Procedure(s) (LRB): LUMBAR WOUND DEBRIDEMENT (N/A) 2 Days Post-Op  Patient reports pain as 5 on 0-10 scale.  Reports unchanged leg pain reports incisional back pain   Positive void Negative bowel movement Positive flatus Negative chest pain or shortness of breath  Objective: Vital signs in last 24 hours: Temp:  [97.6 F (36.4 C)-98.4 F (36.9 C)] 97.6 F (36.4 C) (01/31 0549) Pulse Rate:  [87-99] 95 (01/31 1036) Resp:  [12-15] 12 (01/31 0549) BP: (95-110)/(59-69) 103/62 (01/31 1036) SpO2:  [94 %] 94 % (01/31 0549)  Intake/Output from previous day: 01/30 0701 - 01/31 0700 In: 462 [P.O.:462] Out: 725 [Urine:725]  Labs: Recent Labs    07/21/18 0230 07/23/18 0429  WBC 15.9* 19.5*  RBC 3.18* 3.35*  HCT 29.0* 30.8*  PLT 331 351   Recent Labs    07/21/18 0230 07/23/18 0429  NA 132* 138  K 3.9 3.8  CL 97* 101  CO2 25 28  BUN 16 13  CREATININE 0.51* 0.55*  GLUCOSE 123* 93  CALCIUM 8.3* 8.1*   No results for input(s): LABPT, INR in the last 72 hours.  Physical Exam: ABD soft Dorsiflexion/Plantar flexion intact Incision: scant drainage Compartment soft Body mass index is 27.01 kg/m.  Wound VAC functioning fine.  Approximately 30 cc of blood.   Assessment/Plan: Patient stable  xrays n/a Continue mobilization with physical therapy Continue care  1.  Appreciate input from Dr. Marin Olp.  Will defer biopsy and decision on radiation therapy to the oncology team. 2.  Patient's white count is elevated which is not to be unexpected given the recent surgery.  However he has not had any significant fevers or increasing pain or purulent drainage from the wound.  The wound itself does appear to be healthier.  Plan is to continue the wound VAC and reevaluate the wound on Monday.  If the wound appears healthier with positive granulation tissue then we will consider primary closure on Wednesday of next week. 3.  She continues to have generalized lower  extremity weakness due to his pain and fatigue.  Yesterday after surgery was actually able to stand 3 times and support his own weight.  We will continue with inpatient rehab as best he is able to tolerate. 4.  Pain control per palliative care.  Melina Schools, MD [facility] [phone]

## 2018-07-23 NOTE — Progress Notes (Signed)
We did go ahead and get the CT scan done yesterday.  I am a little surprised as to the amount of growth that has happened.  The mass in the right lower lobe now measures 7.6 x 10.1 cm.  He has some new subcentimeter lesions in the lungs.  There is a large left hepatic lobe mass now.  He has aggressive disease on the right chest wall.  We now have the opportunity to get a biopsy that is not bone based.  By doing this, we will be able to send the studies off for molecular profiling.  Think our only hope to be able to help him is that we find a genetic mutation or he has a significant PD-L1 percentage.  His prealbumin is only 7.2.  He just is not a candidate for systemic chemotherapy.  I spoke with radiation oncology a couple days ago about radiation therapy to the left clavicle.  Hopefully, they will see him today and get this all set up.  I think the fact that he is still anemic is also somewhat of a negative prognostic marker for him.  He did not eat much yesterday.  He says he just was in a "bad mood."  Pain wise, he seems to be holding his own.  I might sure how much he is doing with physical therapy for activity.  His daughter was with him this morning.  We talked about doing a biopsy.  I think a biopsy of 1 of the right chest wall metastasis would be easy enough to get.  He is agreeable to that.  I think it is becoming more apparent that this tumor is quite aggressive.  I think that if we can get a biopsy and we do not find any genetic mutation that we can target and the tumor is PD-L1 negative, I am just not sure that we will be able to help him out with systemic therapy.  I still believe that radiation therapy would be worthwhile.  I keep trying to encourage him to eat.  It would really be nice if his prealbumin to get above 10.  We will continue to follow along and try to help out in any way possible.  Lattie Haw, MD  Isaiah 44:6

## 2018-07-23 NOTE — Progress Notes (Signed)
Physical Therapy Session Note  Patient Details  Name: Brian [name] MRN: [id number] Date of Birth: [birth year]  Today's Date: 07/23/2018 PT Individual Time: 9539-6728 PT Individual Time Calculation (min): 23 min   Short Term Goals: Week 4:  PT Short Term Goal 1 (Week 4): = LTGs due to ELOS  Skilled Therapeutic Interventions/Progress Updates:   Pt in supine and refusing activity this afternoon 2/2 fatigue/pain. Did not rate pain but wincing w/ any active movement throughout session. Total assist to reposition in bed for pain relief. Daughter asking questions about new orders for "no bicep curls", educated both on risk of exercise and bone metastases. Provided therapeutic listening of pt's concerns about his prognosis and encouragement for pt to eat to increase energy level and improve overall well-being. Pt needed max encouragement to take bites and able to do so w/ set-up assist and increased time - moves at very slow pace. Ended session in supine and in care of daughter, all needs met. Missed 22 min of skilled PT 2/2 pain/fatigue.  Therapy Documentation Precautions:  Precautions Precautions: Back Precaution Comments: re-educated on BLT, pt cont to be reminded during function Required Braces or Orthoses: Spinal Brace Spinal Brace: Thoracolumbosacral orthotic, Applied in sitting position Restrictions Weight Bearing Restrictions: No Vital Signs: Therapy Vitals Pulse Rate: 96 Resp: 12 BP: 99/60 Patient Position (if appropriate): Lying Oxygen Therapy SpO2: 97 % O2 Device: Nasal Cannula O2 Flow Rate (L/min): 2 L/min  Therapy/Group: Individual Therapy  Akira Adelsberger Clent Demark 07/23/2018, 4:39 PM

## 2018-07-23 NOTE — Progress Notes (Signed)
Daily Progress Note   Patient Name: Brian [name]       Date: 07/23/2018 DOB: [birth year]  Age: 73 y.o. MRN#: [id number] Attending Physician: Jamse Arn, MD Primary Care Physician: [name], MD Admit Date: 06/29/2018  Reason for Consultation/Follow-up: Establishing goals of care  Subjective: Patient awake, alert, oriented. Irritable this afternoon.   GOC:   F/u GOC discussion with patient, wife [name]), daughter [name]), and cousin Stanton Kidney).   Again introduced role of palliative medicine.   Discussed course of hospitalization including diagnoses and interventions. Reviewed plan of care and recommendations by oncology. Compassionately reviewed CT scan results with patient and family. Stanton Kidney speaks of plan for biopsy today. Patient wishes to pursue biopsy. He plans to do radiation and immunotherapy if he is a candidate. Patient/family hopeful he will continue to progress with therapy with goal to return home.   Further discussed advance directives and MOST form. Recalled my conversation with Brenda yesterday to the family regarding his wishes against life-prolonging interventions at EOL. Patient confirms his wish for DNR/DNI code status. Otherwise, wishes to continue current plan of care and limited interventions if indicating including cpap/bipap, IVF/ABX, and feeding tube for a time trial. After this discussion, he tells the group he is not ready to sign MOST form today but that I can come back on Monday. Wife requests I notify care team of his wishes so the same questions are not continuously asked. Explained that in epic, he is a full code. Patient and family agreeable with code status to be changed moving forward. DNR in the event of cardiac arrest. We discussed concern that with  his progressive cancer and frailty, resuscitation at EOL will cause pain and suffering.  Daughter, [name], is hesitant with DNR code status, feeling everything won't be done. Further educated on this intervention being done with the heart stops and an individual dies. Further explained that if acute issues arise (such as confusion/chest pain, etc) that these issues will be worked up appropriately.   Educated on cancer disease trajectory and EOL expectations.   Wife has arranged for attorney to come to bedside to complete living will/POA paperwork on Tuesday. Patient confirms his wishes for POA 1) Cindy and 2) [name].   Answered all questions and concerns. Therapeutic listening and emotional support provided.   Family has  PMT contact information.     Length of Stay: 24  Current Medications: Scheduled Meds:  . carvedilol  6.25 mg Oral BID WC  . clobetasol ointment   Topical BID  . diclofenac sodium  2 g Topical QID  . DULoxetine  60 mg Oral Daily  . feeding supplement (PRO-STAT SUGAR FREE 64)  30 mL Oral TID WC & HS  . fentaNYL  1 patch Transdermal Q72H  . furosemide  20 mg Oral BID  . gabapentin  300 mg Oral TID  . guaiFENesin  10 mL Oral Q6H  . hydrocerin   Topical BID  . HYDROcodone-acetaminophen  1 tablet Oral QHS  . magnesium oxide  200 mg Oral Daily  . methocarbamol  750 mg Oral QID  . pantoprazole  40 mg Oral Daily  . potassium chloride  30 mEq Oral BID  . senna-docusate  2 tablet Oral QPC supper  . tamsulosin  0.4 mg Oral QPC supper  . vitamin C  500 mg Oral BID  . zinc sulfate  220 mg Oral BID    Continuous Infusions: . sodium chloride Stopped (07/22/18 0610)  . cefTRIAXone (ROCEPHIN)  IV    . lactated ringers Stopped (07/21/18 1930)    PRN Meds: sodium chloride, camphor-menthol, diphenhydrAMINE, diphenhydrAMINE, heparin lock flush, HYDROcodone-acetaminophen, HYDROmorphone HCl, menthol-cetylpyridinium **OR** phenol, polyethylene glycol, prochlorperazine **OR**  prochlorperazine **OR** prochlorperazine, sodium chloride flush, sodium phosphate  Physical Exam Vitals signs and nursing note reviewed.  Constitutional:      Appearance: He is cachectic. He is ill-appearing.  HENT:     Head: Normocephalic and atraumatic.  Pulmonary:     Effort: No tachypnea, accessory muscle usage or respiratory distress.  Abdominal:     Tenderness: There is no abdominal tenderness.  Skin:    General: Skin is warm and dry.     Coloration: Skin is pale.  Neurological:     Mental Status: He is alert and oriented to person, place, and time.  Psychiatric:        Attention and Perception: Attention normal.        Mood and Affect: Mood normal.        Speech: Speech normal.        Behavior: Behavior is cooperative.        Cognition and Memory: Cognition normal.            Vital Signs: BP 110/69 (BP Location: Left Arm)   Pulse 99   Temp 97.6 F (36.4 C) (Oral)   Resp 12   Ht 5\' 10"  (1.778 m)   Wt 85.4 kg   SpO2 94%   BMI 27.01 kg/m  SpO2: SpO2: 94 % O2 Device: O2 Device: Room Air O2 Flow Rate: O2 Flow Rate (L/min): 3 L/min  Intake/output summary:   Intake/Output Summary (Last 24 hours) at 07/23/2018 1030 Last data filed at 07/23/2018 1749 Gross per 24 hour  Intake 462 ml  Output 625 ml  Net -163 ml   LBM: Last BM Date: 07/21/18 Baseline Weight: Weight: 96.8 kg Most recent weight: Weight: 85.4 kg       Palliative Assessment/Data: PPS 40%     Patient Active Problem List   Diagnosis Date Noted  . Goals of care, counseling/discussion   . Pain   . Pain aggravated by physical activity   . Right hip pain   . Sleep disturbance   . Peripheral edema   . Leukocytosis   . Hyponatremia   . Sinus tachycardia   . Hypomagnesemia   .  Orthostatic hypotension   . Labile blood pressure   . Cancer associated pain   . DVT of lower extremity (deep venous thrombosis) ([place])   . Malignant neoplasm of lung ([place])   . Hypoalbuminemia due to protein-calorie  malnutrition ([place])   . Acute blood loss anemia   . Postoperative pain   . Pathological compression fracture of lumbar vertebra ([place]) 06/29/2018  . Metastatic lung cancer (metastasis from lung to other site) ([place]) 06/28/2018  . Rash 06/25/2018  . Hypokalemia 06/25/2018  . Normocytic anemia 06/25/2018  . S/P lumbar fusion 06/24/2018  . Palliative care by specialist   . Cancer related pain   . Lung cancer, primary, with metastasis from lung to other site, right ([place]) 06/14/2018  . Squamous cell lung cancer, right ([place]) 06/14/2018  . Lung cancer metastatic to bone ([place]) 06/14/2018  . Bronchitis due to tobacco use 06/07/2018  . Lumbar spine tumor 06/04/2018    Palliative Care Assessment & Plan   Patient Profile: 74 y.o. male  with past medical history of rheumatoid arthritis admitted on 06/04/2018 with low back pain, radicular pain into right buttocks and leg, and rib/left shoulder pain. CT myelogram ordered outpatient and revealed L4 pathologic compression fracture with lytic destruction and extraosseous tumor encroaching into spinal canal and L4-L5 intervertebral foramina. Seen in outpatient ortho office and decision made for direct admission for further workup. 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 Patient s/p LV corpectomy on 12/20 and L3-L5 spinal fusion on 12/22. Hospitalization and course of rehab complicated by left DVT and surgical site wound infection. Oncology following.   Assessment: Metastatic lung cancer  L4 vertebral body fracture s/p L4 corpectomy and L3-5 fusion Left DVT Cancer related pain Lumbar wound infection s/p I&D and wound vac  Recommendations/Plan:  DNR/DNI in the event of cardiac arrest. Otherwise, continue current plan of care and interventions.   Reviewed MOST form. Patient's wishes include DNR/DNI, limited interventions including CPAP/BiPAP if indicated, IVF/ABX if indicated, and tube feeding for trial  period. Patient is not ready to sign today but tells me to come back on Monday. Patient, wife, and daughter agreeable with code status change in epic to DNR.   Wife arranged inpatient meeting with attorney on 07/27/2018 to complete living will/POA paperwork.   Patient does have a good understanding that his cancer is terminal and incurable. He is hopeful to progress with therapy with goal to return home. He is interested in immunotherapy if offered by oncology. He is agreeable for palliative radiation. He wishes to pursue biopsy per oncology.  Code Status: DNR   Code Status Orders  (From admission, onward)         Start     Ordered   06/29/18 1526  Full code  Continuous     06/29/18 1525        Code Status History    Date Active Date Inactive Code Status Order ID Comments User Context   06/11/2018 2031 06/29/2018 1440 Full Code [id number]  Milagros Loll, MD Inpatient   06/11/2018 2011 06/11/2018 2030 Full Code [id number]  Melina Schools, MD Inpatient   06/04/2018 1344 06/11/2018 2011 Full Code [id number]  Ardeen Jourdain, PA-C Inpatient       Prognosis:   Poor prognosis with metastatic lung cancer, prolonged hospitalization/rehab stay secondary to DVT and wound infection, ongoing cancer related pain.   Discharge Planning:  To Be Determined   Care plan was discussed with patient, wife, daughter, cousin,  Algis Liming PA  Thank you for allowing the Palliative Medicine Team to assist in the care of this patient.   Time In: 1100 Time Out: 1220 Total Time 80 Prolonged Time Billed yes      Greater than 50%  of this time was spent counseling and coordinating care related to the above assessment and plan.  Ihor Dow, FNP-C Palliative Medicine Team  Phone: [phone] Fax: [phone]  Please contact Palliative Medicine Team phone at [phone] for questions and concerns.

## 2018-07-23 NOTE — Progress Notes (Signed)
Occupational Therapy Note  Patient Details  Name: Brian [name] MRN: [id number] Date of Birth: [birth year]  Today's Date: 07/23/2018 OT Missed Time: 74 Minutes Missed Time Reason: Pain(after wound vac dressing change)  Pt missed 60 min skilled OT d/t pain after wound vac change. Pt refusing all mobility/tx at this time. Will follow up as appropriate.   Lowella Dell Saed Hudlow 07/23/2018, 12:10 PM

## 2018-07-23 NOTE — Progress Notes (Addendum)
Pharmacy Antibiotic Note  Brian [name] is a 73 y.o. male admitted on 06/29/2018.  Pharmacy has been consulted for antibiotic recommendation with rash to cefazolin.  On IV abx for back wound. Grew pan sensitive E. Coli in cx. Has been on cefazolin for over 1 week and slowly developed a rash that started on his arms and spread to his legs. MD reports has moderately erythematous and the patient reports mild pruritus.   Discussed with MD. With mild reaction that developed slowly, will stop cefazolin and trial ceftriaxone as would have less cross reactivity then if we were to trial Unasyn / ampicillin. Already has cream to apply to rash on medication list.  Plan: Stop cefazolin Start ceftriaxone 2g IV Q24h Add diphenhydramine 25mg  PO Q6h PRN for itching / rash Monitor s/s of allergic reaction  Abx were planned to continue until at least 2/17  Height: 5\' 10"  (177.8 cm) Weight: 188 lb 4.4 oz (85.4 kg) IBW/kg (Calculated) : 73  Temp (24hrs), Avg:98 F (36.7 C), Min:97.6 F (36.4 C), Max:98.4 F (36.9 C)  Recent Labs  Lab 07/19/18 0459 07/21/18 0230 07/23/18 0429  WBC 16.2* 15.9* 19.5*  CREATININE  --  0.51* 0.55*    Estimated Creatinine Clearance: 84.9 mL/min (A) (by C-G formula based on SCr of 0.55 mg/dL (L)).    Allergies  Allergen Reactions  . Oxycodone     Severe N/V  . Cefazolin Rash    Had been on for about a week, rash started on arms and then spread to legs. Moderately erythematous with mild pruritus  . Minoxidil Palpitations  . Nicotine Palpitations  . Sulfamethoxazole Other (See Comments)    Both parents allergic, avoids med   Thank you for allowing pharmacy to be a part of this patient's care.  Reginia Naas 07/23/2018 8:32 AM

## 2018-07-23 NOTE — Progress Notes (Signed)
[facility] PHYSICAL MEDICINE & REHABILITATION PROGRESS NOTE  Subjective/Complaints:   No change in skin itching , Right shoulder pain at time Reviewed CT and onc note Appetite fair this am   ROS: Denies CP, shortness of breath, nausea, vomiting, diarrhea.  Objective: Vital Signs: Blood pressure 110/69, pulse 99, temperature 97.6 F (36.4 C), temperature source Oral, resp. rate 12, height '5\' 10"'$  (1.778 m), weight 85.4 kg, SpO2 94 %. Ct Chest W Contrast  Result Date: 07/22/2018 CLINICAL DATA:  Non-small cell lung cancer. EXAM: CT CHEST WITH CONTRAST TECHNIQUE: Multidetector CT imaging of the chest was performed during intravenous contrast administration. CONTRAST:  79m OMNIPAQUE IOHEXOL 300 MG/ML  SOLN COMPARISON:  06/04/2018 FINDINGS: Cardiovascular: The heart is within normal limits in size. No pericardial effusion. There is tortuosity and calcification of the thoracic aorta but no dissection or focal aneurysm. Advanced three-vessel coronary artery calcifications are noted. Mediastinum/Nodes: Stable sized mediastinal and right hilar lymph nodes. Lower right paratracheal node on image number 59 measures 14 mm and previously measured 13 mm. Pretracheal lymph node on image number 54 measures 8 mm and is stable. Small AP window lymph nodes are stable. 15 mm right hilar node is unchanged. Lungs/Pleura: Large right lower lobe lung mass measures approximately 7.6 by 10.1 cm and previously measured 8.1 x 5.1 cm. 6 mm right lower lobe pulmonary nodule on image number 87 appears to be new. 4.5 mm subpleural nodule on image number 92 is stable. 8 mm nodule along the left major fissure on image number 50 previously measured 3.5 mm. Two small adjacent pulmonary nodules in the left upper lobe on images 65 and 69 are new. New 8 mm right upper lobe nodule posteriorly on image number 35. This has a vessel going right into it and could be rounded atelectasis. New 4.5 mm right upper lobe nodule on image number 35.  Stable advanced emphysematous changes and pulmonary scarring. Upper Abdomen: Enlarging left adrenal gland lesion consistent with a metastatic focus. Maximum transverse diameter is 3 cm and was previously 2 cm. There is a large lesion involving the left hepatic lobe which I do not see on the prior study. It is hard to believe that this is a metastasis given its size (8.4 x 6.3 cm). I think it is much more likely a hepatic infarct. Although these are rare I do not see the left portal vein and it is likely thrombosed. There is a faint lesion in segment 4B which could be a metastasis. The right and middle portal veins are patent. Musculoskeletal: The destructive bone lesion involving the clavicle has enlarged. There is progressive destructive bony changes. This measures approximately 4.6 x 4.2 cm and previously measured 2.6 x 2.3 cm. It also appears to be infiltrating the left pectoralis major muscle now. The right eighth rib lesion is also progressive. The rib is completely destroyed. The soft tissue component measures 6.8 x 3.8 cm and previously measured 7.1 x 3.3 cm. There is a new or progressive destructive lesion involving the anterior third rib with a small soft tissue mass measuring 15 mm. Tumor is also invading the right ninth posterior rib and this appears slightly progressive. IMPRESSION: 1. Fairly progressive disease since the prior CT scan from 6 weeks ago. The right lower lobe lung mass has enlarged. There are new and progressive pulmonary metastasis. The bone metastasis have significantly progressed as detailed above. 2. Large left hepatic lobe lesion is most likely a hepatic infarct from left portal vein thrombosis. There is a  suspicious lesion in segment 4B which is likely a metastasis. 3. Enlarging left adrenal gland metastasis. 4. Overall stable mediastinal and right hilar lymph nodes. 5. Stable advanced three-vessel coronary artery calcifications. Aortic Atherosclerosis (ICD10-I70.0) and Emphysema  (ICD10-J43.9). Electronically Signed   By: Marijo Sanes M.D.   On: 07/22/2018 19:55   Recent Labs    07/21/18 0230 07/23/18 0429  WBC 15.9* 19.5*  HGB 8.8* 8.8*  HCT 29.0* 30.8*  PLT 331 351   Recent Labs    07/21/18 0230 07/23/18 0429  NA 132* 138  K 3.9 3.8  CL 97* 101  CO2 25 28  GLUCOSE 123* 93  BUN 16 13  CREATININE 0.51* 0.55*  CALCIUM 8.3* 8.1*    Physical Exam: BP 110/69 (BP Location: Left Arm)   Pulse 99   Temp 97.6 F (36.4 C) (Oral)   Resp 12   Ht '5\' 10"'$  (1.778 m)   Wt 85.4 kg   SpO2 94%   BMI 27.01 kg/m  Constitutional: No distress . Vital signs reviewed. HENT: Normocephalic.  Atraumatic. Eyes: EOMI. No discharge. Cardiovascular: RRR.  No JVD. Respiratory: CTA bilaterally.  Normal effort.  + [place] GI: BS +. Non-distended. Musculoskeletal: Bilateral lower extremity edema, left greater than right, improving Neurological: He is alert and oriented  Dysphonia Able to follow basic commands without difficulty.  Motor:  Right lower extremity: 3+/5 proximal distal, pain inhibition, improving Left lower extremity: Hip flexion 3-/5, knee extension 4-/5, ankle dorsiflexion 4/5, some pain inhibition, improving Skin: Warm and dry. Intact.  Back incision with VAC Psychiatric: Flat  Assessment/Plan: 1. Functional deficits secondary to lung cancer with mets to ribs, vertebrae with oncological fracture status post lumbar fusion which require 3+ hours per day of interdisciplinary therapy in a comprehensive inpatient rehab setting.  Physiatrist is providing close team supervision and 24 hour management of active medical problems listed below.  Physiatrist and rehab team continue to assess barriers to discharge/monitor patient progress toward functional and medical goals  Care Tool:  Bathing  Bathing activity did not occur: Safety/medical concerns(Unable to assess 2/2 drop in BP with upright positioning) Body parts bathed by patient: Right arm, Left arm,  Chest, Abdomen, Front perineal area, Right upper leg, Left upper leg, Face   Body parts bathed by helper: Right lower leg, Left lower leg, Buttocks     Bathing assist Assist Level: Moderate Assistance - Patient 50 - 74%     Upper Body Dressing/Undressing Upper body dressing Upper body dressing/undressing activity did not occur (including orthotics): Safety/medical concerns(B/P issues ) What is the patient wearing?: Orthosis Orthosis activity level: Performed by helper  Upper body assist Assist Level: Total Assistance - Patient < 25%    Lower Body Dressing/Undressing Lower body dressing      What is the patient wearing?: Pants, Incontinence brief     Lower body assist Assist for lower body dressing: 2 Helpers     Toileting Toileting Toileting Activity did not occur (Clothing management and hygiene only): Safety/medical concerns(Unable to assess 2/2 drop in BP with upright positioning)  Toileting assist Assist for toileting: 2 Helpers     Transfers Chair/bed transfer  Transfers assist     Chair/bed transfer assist level: Contact Guard/Touching assist     Locomotion Ambulation   Ambulation assist   Ambulation activity did not occur: Safety/medical concerns  Assist level: Minimal Assistance - Patient > 75% Assistive device: Walker-rolling Max distance: 23'   Walk 10 feet activity   Assist  Assist level: Moderate Assistance - Patient - 50 - 74% Assistive device: Walker-rolling   Walk 50 feet activity   Assist           Walk 150 feet activity   Assist           Walk 10 feet on uneven surface  activity   Assist           Wheelchair     Assist Will patient use wheelchair at discharge?: Yes Type of Wheelchair: Manual Wheelchair activity did not occur: Safety/medical concerns(hypotensive)  Wheelchair assist level: Supervision/Verbal cueing Max wheelchair distance: 150    Wheelchair 50 feet with 2 turns  activity    Assist    Wheelchair 50 feet with 2 turns activity did not occur: Safety/medical concerns   Assist Level: Supervision/Verbal cueing   Wheelchair 150 feet activity     Assist Wheelchair 150 feet activity did not occur: Safety/medical concerns   Assist Level: Supervision/Verbal cueing      Medical Problem List and Plan: 1.Functional and mobility deficitssecondary to metastatic lung cancer to left clavicle, ribs, L4 vertebral body with fracture. Pt is s/p L4 corpectomy and L3-5 fusion by Dr. Rolena Infante on 06/11/18  Continue CIR-  Per hematology/oncology, new chest wall as well as lung mass on CT chest from 1/30  Rad-onc involved. CHest wall biopsy ordered  Status post I&D of back wound 1/17 -repeat I and D with wound vac placement POD #2 2. DVT: dopplers done this evening and patient has DVT in left CFV             IVC filter placement on 1/8  Pt not a candidate for anticoagulation due to hematoma and bleeding riskat present although long term he is high VTE risk 3. Pain Management: dilaudid prn. Unable to tolerate Oxycodone   Fentanyl increased to 25 mcg on 1/17, increased to 50 on 1/21- cont current dose  Appreciate palliative care consult- notes reviewed  Cymbalta added on 1/17, increased on 1/24  Right coracoid met- no biceps curls  Voltaren gel added on 1/23 with improvement  Relatively controlled on 1/28 Hydrocodone for moderate and dilaudid for severe breakthough pain 4. Mood:LCSW to follow for evaluation and support.  Cymbalta added  Patient more calm when daughter not present 5. Neuropsych: This patientiscapable of making decisions on hisown behalf. 6. Skin/Wound Care:  VAC in place on back wound per ortho, discussed with WOC, wound bed looks ok, ~73m blood noted7. Fluids/Electrolytes/Nutrition:Monitor I/O.  8.Blood pressure:  Abdominal binder as needed  Relatively controlled on 1/28, orthostatics negative  Monitor with increased  mobility 9. ABLA: Continue to monitor with serial checks.   Hemoglobin 9.0 on 1/27- repeat 1/31 8.8  Iron studies consistent with anemia of chronic disease: Ferritin levels high  IV iron 10. Hypokalemia:   Potassium 4.3 on 1/24   Supplement increased on 1/13  Magnesium 2.2 on 1/24   Supplement initiated on 1/14 11. Rash: Continue Clobetasol bidas well as hypoallergenic sheets. 12. Neuropathic pain: Improving on gabapentin 300 mg tid 13. Constipation:Managed with miralax prn--patient wants to manage his bowel program. 14.  Hypoalbuminemia  Supplement initiated on 1/8 15.  Sinus tachycardia  Metoprolol 12.5 twice daily started on 1/13, changed to Coreg on 1/15  Improved 16.  Hyponatremia  Sodium 135 on 1/24  Continue to monitor 17.  Wound infection- now with skin rash which is spreading to legs, was arms only  WBCs 16.2 on 1/27, trending up, await ID/Ortho recs  Labs ordered  for tomorrow  Wound cultures with E. coli, pansensitive  Chest x-ray reviewed with patient, unremarkable for acute infectious process, repeat chest x-ray on 1/16 reviewed showing some lower abnormality-continue to monitor  Afebrile  Appreciate ID recs, notes reviewed- IV ceftezole   Antibiotic beads placed by Ortho in wound  Blood cultures no growth  ESR/CRP elevated on 1/22  Back incision as above 18. Peripheral edema  Lasix 20 daily started on 1/15, increased to twice daily on 1/21 local care and elevation   Improving 19.  Sleep disturbance  Trazodone DC'd, Benadryl ordered for pruritus and sleep on 1/17  Improving.  21.  Multiple joint pain    Right shoulder due to clavicle met- CHest CT should image this today LOS: 24 days A FACE TO FACE EVALUATION WAS PERFORMED Complex pt needs palliative to discuss GOC, 35 min spent Charlett Blake 07/23/2018, 8:06 AM

## 2018-07-23 NOTE — Consult Note (Addendum)
[facility] Nurse wound follow-up consult note Vac dressing change to post-op incisions on middle back.  There is a large amt drape across most of the patient's back and it has created areas of medical adhesive related skin injuries to upper (3X1X.1cm) and left back (4X1.5X.1cm) when removed; partial thickness wounds are pink and painful, surrounded by ruptured clear fluid filled blisters and small amt yellow drainage. Foam dressings applied to protect and promote healing. Measurement:Full thickness post-op incisions in 2 locations to middle back are beefy red, small amt bloody drainage, no odor.  Upper wound 3X1X1cm, middle back 7X3X3cm. Dressing procedure/placement/frequency:Pt medicated prior to procedure, but it was still very painful.Family member at the bedside to assess wound appearance during dressing change and take a photo. Applied one piece black foam to each wound, then bridged together and bridged track pad to the flank to reduce pressure over the back.Cont suction on at 125mm. [facility] team will perform dressing changes Q M/W/F at 0800-0830.   Julien Girt MSN, RN, [place], [place], [place]

## 2018-07-24 ENCOUNTER — Inpatient Hospital Stay (HOSPITAL_COMMUNITY): Payer: Medicare Other

## 2018-07-24 ENCOUNTER — Inpatient Hospital Stay (HOSPITAL_COMMUNITY): Payer: Medicare Other | Admitting: Physical Therapy

## 2018-07-24 NOTE — Progress Notes (Signed)
Occupational Therapy Session Note  Patient Details  Name: Brian [name] MRN: [id number] Date of Birth: 1945-10-11  Today's Date: 07/24/2018 OT Individual Time: [phone] and [phone] OT Individual Time Calculation (min): 20 min and 30 min  Missed 10 min skilled OT d/t RN care   Short Term Goals: Week 1:  OT Short Term Goal 1 (Week 1): Pt will complete transfer to toillet/BSC with mod A using LRAD OT Short Term Goal 1 - Progress (Week 1): Not met OT Short Term Goal 2 (Week 1): Pt will thread B LEs into pants with steadying assisting using AE PRN OT Short Term Goal 2 - Progress (Week 1): Met OT Short Term Goal 3 (Week 1): Pt will tolerate standing at sink to complete 1 grooming task in standing with steadying assist OT Short Term Goal 3 - Progress (Week 1): Not met  Skilled Therapeutic Interventions/Progress Updates:    Session 1 (20 min): pt received asleep. Pt requires increased time to arouse reporting back pain. Repositioned for comfort and alerted RN. Attempted to engage pt in sitting EOB for eating, however wife states prefers he not since "he gets distracted already and I just want him to eat." Elevated HOB>30* for safe swallowing and encourage pt to continue to eat and OT will return after breakfast.  Upon second attempt to see pt, bed linens soiled and RN providing care to clean up.  Session 2: (30 min) Upon 3rd attempt RN still in room attempting to deliver medication while OT dons teds, clean shorts and shoes total A. Pt rolls with mod A and VC for log rolling. Pt supine>sitting EOB with MAX A. Pt slide board transfer with min A and wife steadying board EOB>w/c with VC for LE management. Exited session with pt seated in w/c, call light in reach and all eneds met  Therapy Documentation Precautions:  Precautions Precautions: Back Precaution Comments: re-educated on BLT, pt cont to be reminded during function Required Braces or Orthoses: Spinal Brace Spinal Brace:  Thoracolumbosacral orthotic, Applied in sitting position Restrictions Weight Bearing Restrictions: No General:   Vital Signs:  Pain: Pain Assessment Pain Scale: 0-10 Pain Score: 6  Pain Type: Acute pain;Surgical pain Pain Location: Back Pain Orientation: Right Pain Descriptors / Indicators: Aching Pain Frequency: Intermittent Pain Intervention(s): Medication (See eMAR) ADL:   Vision   Perception    Praxis   Exercises:   Other Treatments:     Therapy/Group: Individual Therapy  Tonny Branch 07/24/2018, 12:09 PM

## 2018-07-24 NOTE — Progress Notes (Signed)
Patient alert and oriented; family remains  by bedside this shift. meds given to patient this shift per request by patient and family; patient remains somewhat uncooperative this shift; comfort measures provide by Probation officer and  staff this shift. Wound vac intact and bed in lowest position. Will continue to monitor and provide comfort measures.

## 2018-07-24 NOTE — Progress Notes (Signed)
Referring Physician(s): Ennever,P  Supervising Physician: Sandi Mariscal  Patient Status:  Brian [name] - In-pt  Chief Complaint:  Metastatic lung cancer  Subjective: Patient familiar to IR service from prior IVC filter placement on 06/30/2018.  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   His course has been complicated by a left lower extremity DVT as well as surgical site wound infection. He has also had lumbar wound debridement, latest on 07/21/2018.   He continues to have leg and back pain.  Denies fever, headache, worsening chest pain, dyspnea, nausea, vomiting or bleeding.  Recent CT chest shows progressive disease with enlargement of right lower lobe lung mass, new and progressive pulmonary metastases as well as progression of bone metastases.  There was a large left hepatic lobe lesion most likely hepatic infarct from left portal vein thrombosis as well as suspicious lesion in segment 4B of liver, likely metastasis. There was also an enlarging left adrenal gland metastasis. Request now received from oncology for core biopsies of the right chest wall mass for molecular studies.  Past Medical History:  Diagnosis Date  . Arthritis    Past Surgical History:  Procedure Laterality Date  . ANTERIOR CERVICAL CORPECTOMY Left 06/11/2018   Procedure: L4 Corpectomy, L3-5 anterior reconstruction,;  Surgeon: Melina Schools, MD;  Location: [facility];  Service: Orthopedics;  Laterality: Left;  6.5 hrs- Ok for this day/time per April  . CATARACT EXTRACTION, BILATERAL    . DECOMPRESSIVE LUMBAR LAMINECTOMY LEVEL 1 N/A 06/24/2018   Procedure: DECOMPRESSIVE LUMBAR 4-5 LAMINECTOMY LEVEL 1;  Surgeon: Melina Schools, MD;  Location: [facility];  Service: Orthopedics;  Laterality: N/A;  . IR IVC FILTER PLMT / S&I /IMG GUID/MOD SED  06/30/2018  . LUMBAR WOUND DEBRIDEMENT N/A 07/09/2018   Procedure:  LUMBAR WOUND DEBRIDEMENT;  Surgeon: Melina Schools, MD;  Location: [facility];  Service: Orthopedics;  Laterality: N/A;  . LUMBAR WOUND DEBRIDEMENT N/A 07/21/2018   Procedure: LUMBAR WOUND DEBRIDEMENT;  Surgeon: Melina Schools, MD;  Location: [facility];  Service: Orthopedics;  Laterality: N/A;  . SHOULDER INJECTION  07/14/2018          Allergies: Oxycodone; Cefazolin; Minoxidil; Nicotine; and Sulfamethoxazole  Medications: Prior to Admission medications   Medication Sig Start Date End Date Taking? Authorizing Provider  acetaminophen (TYLENOL) 325 MG tablet Take 1-2 tablets (325-650 mg total) by mouth every 4 (four) hours as needed for mild pain. 06/30/18   Love, Ivan Anchors, PA-C  acetaminophen (TYLENOL) 650 MG suppository Place 1 suppository (650 mg total) rectally every 4 (four) hours as needed for mild pain ((score 1 to 3) or temp > 100.5). 06/29/18   Melina Schools, MD  albuterol (PROVENTIL) (2.5 MG/3ML) 0.083% nebulizer solution Take 3 mLs (2.5 mg total) by nebulization every 6 (six) hours as needed for wheezing or shortness of breath. 06/29/18   Melina Schools, MD  bisacodyl (DULCOLAX) 10 MG suppository Place 1 suppository (10 mg total) rectally daily as needed for moderate constipation. 06/29/18   Melina Schools, MD  camphor-menthol [name]) lotion Apply topically as needed for itching. 06/30/18   Love, Ivan Anchors, PA-C  clobetasol ointment (TEMOVATE) 0.05 % Apply topically 2 (two) times daily. 06/29/18   Melina Schools, MD  feeding supplement, ENSURE ENLIVE, (ENSURE ENLIVE) LIQD Take 237 mLs by mouth 2 (two) times daily between meals. 06/29/18   Melina Schools, MD  fentaNYL (  DURAGESIC - DOSED MCG/HR) 12 MCG/HR Place 1 patch (12.5 mcg total) onto the skin every 3 (three) days. 06/30/18   Melina Schools, MD  gabapentin (NEURONTIN) 300 MG capsule Take 1 capsule (300 mg total) by mouth 3 (three) times daily. 06/29/18   Melina Schools, MD  guaiFENesin (ROBITUSSIN) 100 MG/5ML SOLN Take 10 mLs (200 mg total) by mouth every 6  (six) hours. 06/29/18   Melina Schools, MD  HYDROcodone-acetaminophen (NORCO) 7.5-325 MG tablet Take 1 tablet by mouth every 8 (eight) hours as needed for severe pain. 06/29/18   Melina Schools, MD  HYDROmorphone HCl (DILAUDID) 1 MG/ML LIQD Take 1 mL (1 mg total) by mouth every 4 (four) hours as needed for moderate pain. 06/29/18   Melina Schools, MD  hydroxypropyl methylcellulose / hypromellose (ISOPTO TEARS / GONIOVISC) 2.5 % ophthalmic solution Place 1-2 drops into both eyes as needed for dry eyes.    [provider]  menthol-cetylpyridinium (CEPACOL) 3 MG lozenge Take 1 lozenge (3 mg total) by mouth as needed for sore throat (sore throat). 06/29/18   Melina Schools, MD  methocarbamol (ROBAXIN) 500 MG tablet Take 1 tablet (500 mg total) by mouth every 8 (eight) hours as needed for muscle spasms. 06/29/18   Melina Schools, MD  pantoprazole (PROTONIX) 40 MG tablet Take 1 tablet (40 mg total) by mouth daily. 06/29/18   Melina Schools, MD  polyethylene glycol [name] Floria Raveling) packet Take 17 g by mouth daily. 06/30/18   Melina Schools, MD     Vital Signs: BP 110/86 (BP Location: Left Arm)   Pulse (!) 113   Temp 97.6 F (36.4 C) (Oral)   Resp 18   Ht 5\' 10"  (1.778 m)   Wt 188 lb 4.4 oz (85.4 kg)   SpO2 95%   BMI 27.01 kg/m   Physical Exam patient awake, alert.  Currently in turtle shell brace; chest with distant breath sounds bilaterally.  Heart with tachycardia and occ ectopy noted.  Abdomen soft, positive bowel sounds, currently nontender.  Trace pretibial edema bilaterally.  Imaging: Ct Chest W Contrast  Result Date: 07/22/2018 CLINICAL DATA:  Non-small cell lung cancer. EXAM: CT CHEST WITH CONTRAST TECHNIQUE: Multidetector CT imaging of the chest was performed during intravenous contrast administration. CONTRAST:  55mL OMNIPAQUE IOHEXOL 300 MG/ML  SOLN COMPARISON:  06/04/2018 FINDINGS: Cardiovascular: The heart is within normal limits in size. No pericardial effusion. There is tortuosity  and calcification of the thoracic aorta but no dissection or focal aneurysm. Advanced three-vessel coronary artery calcifications are noted. Mediastinum/Nodes: Stable sized mediastinal and right hilar lymph nodes. Lower right paratracheal node on image number 59 measures 14 mm and previously measured 13 mm. Pretracheal lymph node on image number 54 measures 8 mm and is stable. Small AP window lymph nodes are stable. 15 mm right hilar node is unchanged. Lungs/Pleura: Large right lower lobe lung mass measures approximately 7.6 by 10.1 cm and previously measured 8.1 x 5.1 cm. 6 mm right lower lobe pulmonary nodule on image number 87 appears to be new. 4.5 mm subpleural nodule on image number 92 is stable. 8 mm nodule along the left major fissure on image number 50 previously measured 3.5 mm. Two small adjacent pulmonary nodules in the left upper lobe on images 65 and 69 are new. New 8 mm right upper lobe nodule posteriorly on image number 35. This has a vessel going right into it and could be rounded atelectasis. New 4.5 mm right upper lobe nodule on image number 35. Stable  advanced emphysematous changes and pulmonary scarring. Upper Abdomen: Enlarging left adrenal gland lesion consistent with a metastatic focus. Maximum transverse diameter is 3 cm and was previously 2 cm. There is a large lesion involving the left hepatic lobe which I do not see on the prior study. It is hard to believe that this is a metastasis given its size (8.4 x 6.3 cm). I think it is much more likely a hepatic infarct. Although these are rare I do not see the left portal vein and it is likely thrombosed. There is a faint lesion in segment 4B which could be a metastasis. The right and middle portal veins are patent. Musculoskeletal: The destructive bone lesion involving the clavicle has enlarged. There is progressive destructive bony changes. This measures approximately 4.6 x 4.2 cm and previously measured 2.6 x 2.3 cm. It also appears to be  infiltrating the left pectoralis major muscle now. The right eighth rib lesion is also progressive. The rib is completely destroyed. The soft tissue component measures 6.8 x 3.8 cm and previously measured 7.1 x 3.3 cm. There is a new or progressive destructive lesion involving the anterior third rib with a small soft tissue mass measuring 15 mm. Tumor is also invading the right ninth posterior rib and this appears slightly progressive. IMPRESSION: 1. Fairly progressive disease since the prior CT scan from 6 weeks ago. The right lower lobe lung mass has enlarged. There are new and progressive pulmonary metastasis. The bone metastasis have significantly progressed as detailed above. 2. Large left hepatic lobe lesion is most likely a hepatic infarct from left portal vein thrombosis. There is a suspicious lesion in segment 4B which is likely a metastasis. 3. Enlarging left adrenal gland metastasis. 4. Overall stable mediastinal and right hilar lymph nodes. 5. Stable advanced three-vessel coronary artery calcifications. Aortic Atherosclerosis (ICD10-I70.0) and Emphysema (ICD10-J43.9). Electronically Signed   By: Marijo Sanes M.D.   On: 07/22/2018 19:55    Labs:  CBC: Recent Labs    07/16/18 0452 07/19/18 0459 07/21/18 0230 07/23/18 0429  WBC 15.4* 16.2* 15.9* 19.5*  HGB 8.1* 9.0* 8.8* 8.8*  HCT 27.5* 31.0* 29.0* 30.8*  PLT 455* 364 331 351    COAGS: Recent Labs    06/04/18 1417 06/12/18 0456 06/14/18 0538  INR 0.99 1.16 1.12  APTT 30 26 29     BMP: Recent Labs    07/14/18 0645 07/16/18 0452 07/21/18 0230 07/23/18 0429  NA 133* 135 132* 138  K 4.0 4.3 3.9 3.8  CL 100 103 97* 101  CO2 26 26 25 28   GLUCOSE 112* 132* 123* 93  BUN 14 24* 16 13  CALCIUM 8.6* 8.6* 8.3* 8.1*  CREATININE 0.63 0.59* 0.51* 0.55*  GFRNONAA >60 >60 >60 >60  GFRAA >60 >60 >60 >60    LIVER FUNCTION TESTS: Recent Labs    06/30/18 0619 07/09/18 0601 07/16/18 0452 07/23/18 0429  BILITOT 1.3* 0.5 0.2*  0.8  AST 32 27 25 17   ALT 30 25 16 10   ALKPHOS 85 115 107 125  PROT 5.3* 4.8* 5.5* 5.5*  ALBUMIN 1.8* 1.6* 1.9* 1.8*    Assessment and Plan:  Pt with known history of 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   His course has been complicated by a left lower extremity DVT as well as surgical site wound infection. He has also had lumbar wound debridement, latest on 07/21/2018.  He continues to have leg and back pain.  Denies fever, headache, worsening chest pain, dyspnea, nausea, vomiting or bleeding.  Recent CT chest shows progressive disease with enlargement of right lower lobe lung mass, new and progressive pulmonary metastases as well as progression of bone metastases.  There was a large left hepatic lobe lesion most likely hepatic infarct from left portal vein thrombosis as well as suspicious lesion in segment 4B of liver, likely metastasis. There was also an enlarging left adrenal gland metastasis. Request now received from oncology for core biopsies of the right chest wall mass for molecular studies.  Latest imaging studies have been reviewed by Dr. Barbie Banner.Risks and benefits discussed with the patient/family including, but not limited to bleeding, infection, damage to adjacent structures or low yield requiring additional tests.  All of the patient's questions were answered, patient is agreeable to proceed. Consent signed and in chart.  Procedure tentatively scheduled for 2/3    Electronically Signed: D. Rowe Robert, PA-C 07/24/2018, 11:19 AM   I spent a total of 25 minutes at the the patient's bedside AND on the patient's [name] floor or unit, greater than 50% of which was counseling/coordinating care for CT-guided right chest wall mass biopsy    Patient ID: Garret Teale, male   DOB: 1945-09-16, 73 y.o.   MRN: [id number]

## 2018-07-24 NOTE — Progress Notes (Signed)
Physical Therapy Session Note  Patient Details  Name: Brian [name] MRN: [id number] Date of Birth: June 12, 1946  Today's Date: 07/24/2018 1530-1540 10 minutes   Short Term Goals: Week 3:  PT Short Term Goal 1 (Week 3): pt will move supine> sit with min assist PT Short Term Goal 1 - Progress (Week 3): Met PT Short Term Goal 2 (Week 3): pt will perform slide board trasnfer with min assist 50% of trials PT Short Term Goal 2 - Progress (Week 3): Met PT Short Term Goal 3 (Week 3): pt will perform gait x 10' with LRAD PT Short Term Goal 3 - Progress (Week 3): Not met PT Short Term Goal 4 (Week 3): pt will state 3/3 spinal precautions PT Short Term Goal 4 - Progress (Week 3): Not met Week 4:  PT Short Term Goal 1 (Week 4): = LTGs due to ELOS  Skilled Therapeutic Interventions/Progress Updates:   Pt asleep upon PT entry and family request that PT return to allow pt to sleep. PT returned in 30 minutes and pt continues to sleep. Pt aroused with effort and initially agreeable to PT. When initiating bed mobility to come to EOB Pt reports incontinent bowel movement. Pt allowed to finish. Pt then preparing for bed mobility to perform pericare and reports additional incontinent BM. Pt requests that therapy is held at this time and RN complete personal care when Bowel movements have completed. Pt left in bed and RN aware of pt condition.      Therapy Documentation Precautions:  Precautions Precautions: Back Precaution Comments: re-educated on BLT, pt cont to be reminded during function Required Braces or Orthoses: Spinal Brace Spinal Brace: Thoracolumbosacral orthotic, Applied in sitting position Restrictions Weight Bearing Restrictions: No General: PT Amount of Missed Time (min): 65 Minutes PT Missed Treatment Reason: Bowel/bladder accident;Patient unwilling to participate;Patient fatigue Vital Signs: Therapy Vitals Temp: 98.1 F (36.7 C) Temp Source: Oral Pulse Rate: 100 Resp: 18 BP:  109/70 Patient Position (if appropriate): Lying Oxygen Therapy SpO2: 97 % O2 Device: Room Air    Therapy/Group: Individual Therapy  [name] 07/24/2018, 5:30 PM

## 2018-07-24 NOTE — Progress Notes (Signed)
[place] PHYSICAL MEDICINE & REHABILITATION PROGRESS NOTE  Subjective/Complaints:   Discussed wound issues with Dr Rolena Infante yesterday.  If wound  cannot be closed on Wednesday and wound vac needs to be applied, pt may be transferred to ortho service once rehab is completed   ROS: Denies CP, shortness of breath, nausea, vomiting, diarrhea.  Objective: Vital Signs: Blood pressure 110/86, pulse (!) 113, temperature 97.6 F (36.4 C), temperature source Oral, resp. rate 18, height '5\' 10"'$  (1.778 m), weight 85.4 kg, SpO2 95 %. Ct Chest W Contrast  Result Date: 07/22/2018 CLINICAL DATA:  Non-small cell lung cancer. EXAM: CT CHEST WITH CONTRAST TECHNIQUE: Multidetector CT imaging of the chest was performed during intravenous contrast administration. CONTRAST:  47m OMNIPAQUE IOHEXOL 300 MG/ML  SOLN COMPARISON:  06/04/2018 FINDINGS: Cardiovascular: The heart is within normal limits in size. No pericardial effusion. There is tortuosity and calcification of the thoracic aorta but no dissection or focal aneurysm. Advanced three-vessel coronary artery calcifications are noted. Mediastinum/Nodes: Stable sized mediastinal and right hilar lymph nodes. Lower right paratracheal node on image number 59 measures 14 mm and previously measured 13 mm. Pretracheal lymph node on image number 54 measures 8 mm and is stable. Small AP window lymph nodes are stable. 15 mm right hilar node is unchanged. Lungs/Pleura: Large right lower lobe lung mass measures approximately 7.6 by 10.1 cm and previously measured 8.1 x 5.1 cm. 6 mm right lower lobe pulmonary nodule on image number 87 appears to be new. 4.5 mm subpleural nodule on image number 92 is stable. 8 mm nodule along the left major fissure on image number 50 previously measured 3.5 mm. Two small adjacent pulmonary nodules in the left upper lobe on images 65 and 69 are new. New 8 mm right upper lobe nodule posteriorly on image number 35. This has a vessel going right into it  and could be rounded atelectasis. New 4.5 mm right upper lobe nodule on image number 35. Stable advanced emphysematous changes and pulmonary scarring. Upper Abdomen: Enlarging left adrenal gland lesion consistent with a metastatic focus. Maximum transverse diameter is 3 cm and was previously 2 cm. There is a large lesion involving the left hepatic lobe which I do not see on the prior study. It is hard to believe that this is a metastasis given its size (8.4 x 6.3 cm). I think it is much more likely a hepatic infarct. Although these are rare I do not see the left portal vein and it is likely thrombosed. There is a faint lesion in segment 4B which could be a metastasis. The right and middle portal veins are patent. Musculoskeletal: The destructive bone lesion involving the clavicle has enlarged. There is progressive destructive bony changes. This measures approximately 4.6 x 4.2 cm and previously measured 2.6 x 2.3 cm. It also appears to be infiltrating the left pectoralis major muscle now. The right eighth rib lesion is also progressive. The rib is completely destroyed. The soft tissue component measures 6.8 x 3.8 cm and previously measured 7.1 x 3.3 cm. There is a new or progressive destructive lesion involving the anterior third rib with a small soft tissue mass measuring 15 mm. Tumor is also invading the right ninth posterior rib and this appears slightly progressive. IMPRESSION: 1. Fairly progressive disease since the prior CT scan from 6 weeks ago. The right lower lobe lung mass has enlarged. There are new and progressive pulmonary metastasis. The bone metastasis have significantly progressed as detailed above. 2. Large left hepatic lobe  lesion is most likely a hepatic infarct from left portal vein thrombosis. There is a suspicious lesion in segment 4B which is likely a metastasis. 3. Enlarging left adrenal gland metastasis. 4. Overall stable mediastinal and right hilar lymph nodes. 5. Stable advanced  three-vessel coronary artery calcifications. Aortic Atherosclerosis (ICD10-I70.0) and Emphysema (ICD10-J43.9). Electronically Signed   By: Marijo Sanes M.D.   On: 07/22/2018 19:55   Recent Labs    07/23/18 0429  WBC 19.5*  HGB 8.8*  HCT 30.8*  PLT 351   Recent Labs    07/23/18 0429  NA 138  K 3.8  CL 101  CO2 28  GLUCOSE 93  BUN 13  CREATININE 0.55*  CALCIUM 8.1*    Physical Exam: BP 110/86 (BP Location: Left Arm)   Pulse (!) 113   Temp 97.6 F (36.4 C) (Oral)   Resp 18   Ht '5\' 10"'$  (1.778 m)   Wt 85.4 kg   SpO2 95%   BMI 27.01 kg/m  Constitutional: No distress . Vital signs reviewed. HENT: Normocephalic.  Atraumatic. Eyes: EOMI. No discharge. Cardiovascular: RRR.  No JVD. Respiratory: CTA bilaterally.  Normal effort.  + [place] [facility]: BS +. Non-distended. Musculoskeletal: Bilateral lower extremity edema, left greater than right, improving Neurological: He is alert and oriented  Dysphonia Able to follow basic commands without difficulty.  Motor:  Right lower extremity: 3+/5 proximal distal, pain inhibition, improving Left lower extremity: Hip flexion 3-/5, knee extension 4-/5, ankle dorsiflexion 4/5, some pain inhibition, improving Skin: Warm and dry. Intact.  Back incision with VAC Psychiatric: Flat  Assessment/Plan: 1. Functional deficits secondary to lung cancer with mets to ribs, vertebrae with oncological fracture status post lumbar fusion which require 3+ hours per day of interdisciplinary therapy in a comprehensive inpatient rehab setting.  Physiatrist is providing close team supervision and 24 hour management of active medical problems listed below.  Physiatrist and rehab team continue to assess barriers to discharge/monitor patient progress toward functional and medical goals  Care Tool:  Bathing  Bathing activity did not occur: Safety/medical concerns(Unable to assess 2/2 drop in BP with upright positioning) Body parts bathed by patient: Right arm,  Left arm, Chest, Abdomen, Front perineal area, Right upper leg, Left upper leg, Face   Body parts bathed by helper: Right lower leg, Left lower leg, Buttocks     Bathing assist Assist Level: Moderate Assistance - Patient 50 - 74%     Upper Body Dressing/Undressing Upper body dressing Upper body dressing/undressing activity did not occur (including orthotics): Safety/medical concerns(B/P issues ) What is the patient wearing?: Orthosis Orthosis activity level: Performed by helper  Upper body assist Assist Level: Total Assistance - Patient < 25%    Lower Body Dressing/Undressing Lower body dressing      What is the patient wearing?: Pants, Incontinence brief     Lower body assist Assist for lower body dressing: 2 Helpers     Toileting Toileting Toileting Activity did not occur (Clothing management and hygiene only): Safety/medical concerns(Unable to assess 2/2 drop in BP with upright positioning)  Toileting assist Assist for toileting: 2 Helpers     Transfers Chair/bed transfer  Transfers assist     Chair/bed transfer assist level: Contact Guard/Touching assist     Locomotion Ambulation   Ambulation assist   Ambulation activity did not occur: Safety/medical concerns  Assist level: Minimal Assistance - Patient > 75% Assistive device: Walker-rolling Max distance: 23'   Walk 10 feet activity   Assist  Assist level: Moderate Assistance - Patient - 50 - 74% Assistive device: Walker-rolling   Walk 50 feet activity   Assist           Walk 150 feet activity   Assist           Walk 10 feet on uneven surface  activity   Assist           Wheelchair     Assist Will patient use wheelchair at discharge?: Yes Type of Wheelchair: Manual Wheelchair activity did not occur: Safety/medical concerns(hypotensive)  Wheelchair assist level: Supervision/Verbal cueing Max wheelchair distance: 150    Wheelchair 50 feet with 2 turns  activity    Assist    Wheelchair 50 feet with 2 turns activity did not occur: Safety/medical concerns   Assist Level: Supervision/Verbal cueing   Wheelchair 150 feet activity     Assist Wheelchair 150 feet activity did not occur: Safety/medical concerns   Assist Level: Supervision/Verbal cueing      Medical Problem List and Plan: 1.Functional and mobility deficitssecondary to metastatic lung cancer to left clavicle, ribs, L4 vertebral body with fracture. Pt is s/p L4 corpectomy and L3-5 fusion by Dr. Rolena Infante on 06/11/18  Continue CIR-  Per hematology/oncology, new chest wall as well as lung mass on CT chest from 1/30  Rad-onc involved. CHest wall biopsy ordered  Status post I&D of back wound 1/17 -repeat I and D with wound vac placement POD #3 2. DVT: dopplers done this evening and patient has DVT in left CFV             IVC filter placement on 1/8  Pt not a candidate for anticoagulation due to hematoma and bleeding riskat present although long term he is high VTE risk 3. Pain Management: dilaudid prn. Unable to tolerate Oxycodone   Fentanyl increased to 25 mcg on 1/17, increased to 50 on 1/21- cont current dose  Appreciate palliative care consult- notes reviewed  Cymbalta added on 1/17, increased on 1/24  Right coracoid met- no biceps curls  Voltaren gel added on 1/23 with improvement  Relatively controlled on 1/28 Hydrocodone for moderate and dilaudid for severe breakthough pain 4. Mood:LCSW to follow for evaluation and support.  Cymbalta added  Patient more calm when daughter not present 5. Neuropsych: This patientiscapable of making decisions on hisown behalf. 6. Skin/Wound Care:  VAC in place on back wound per ortho, discussed with WOC, wound bed looks ok, ~24m blood noted7. Fluids/Electrolytes/Nutrition:Monitor I/O.  8.Blood pressure:  Abdominal binder as needed  Relatively controlled on 1/28, orthostatics negative  Monitor with increased  mobility 9. ABLA: Continue to monitor with serial checks.   Hemoglobin 9.0 on 1/27- repeat 1/31 8.8  Iron studies consistent with anemia of chronic disease: Ferritin levels high  IV iron 10. Hypokalemia:   Potassium 4.3 on 1/24   Supplement increased on 1/13  Magnesium 2.2 on 1/24   Supplement initiated on 1/14 11. Rash: Continue Clobetasol bidas well as hypoallergenic sheets. 12. Neuropathic pain: Improving on gabapentin 300 mg tid 13. Constipation:Managed with miralax prn--patient wants to manage his bowel program. 14.  Hypoalbuminemia  Supplement initiated on 1/8 15.  Sinus tachycardia  Metoprolol 12.5 twice daily started on 1/13, changed to Coreg on 1/15  Improved 16.  Hyponatremia  Sodium 135 on 1/24  Continue to monitor 17.  Wound infection- now with skin rash which is spreading to legs, was arms only  WBCs 16.2 on 1/27, trending up, await ID/Ortho recs  Labs ordered  for tomorrow  Wound cultures with E. coli, pansensitive  Chest x-ray reviewed with patient, unremarkable for acute infectious process, repeat chest x-ray on 1/16 reviewed showing some lower abnormality-continue to monitor  Afebrile  Appreciate ID recs, notes reviewed- IV ceftezole   Antibiotic beads placed by Ortho in wound  Blood cultures no growth  ESR/CRP elevated on 1/22  Back incision as above 18. Peripheral edema  Lasix 20 daily started on 1/15, increased to twice daily on 1/21 local care and elevation   Improving 19.  Sleep disturbance  Trazodone DC'd, Benadryl ordered for pruritus and sleep on 1/17  Improving.  21.  Multiple joint pain    Right shoulder due to coracoid met- some mild tenderness, no biceps curls LOS: 25 days A FACE TO FACE EVALUATION WAS PERFORMED  Charlett Blake 07/24/2018, 7:22 AM

## 2018-07-25 ENCOUNTER — Inpatient Hospital Stay (HOSPITAL_COMMUNITY): Payer: Medicare Other

## 2018-07-25 NOTE — Progress Notes (Signed)
Physical Therapy Session Note  Patient Details  Name: Brian [name] MRN: [id number] Date of Birth: 09-18-1945  Today's Date: 07/25/2018 PT Individual Time: 1302-1400 PT Individual Time Calculation (min): 58 min   Short Term Goals: Week 4:  PT Short Term Goal 1 (Week 4): = LTGs due to ELOS  Skilled Therapeutic Interventions/Progress Updates:    Pt supine in bed upon PT arrival, agreeable to therapy tx and reports pain but does not rate this session. Pt takes increased time to respond this session and seems very disengaged. Pt agreeable to get OOB. Pt transferred to sitting EOB from supine with max assist +2 secondary to new UE precautions. Pt performed slideboard transfer to w/c with mod assist, verbal cues for techniques and increased time to complete. Pt transported outside this session, requesting to get some fresh air, pt's family present Production assistant, radio). While outside therapist discussed non-emergency medical transport home as a possible option for the family to take pt home without having to perform car transfers, depending on pts progress and fatigue levels. Therapist asking pt if he is ready to go home, pt reports "I don't want to fall on my face." Therapist provided emotional support and education on potential d/c barriers. Pt transported back to room and left in w/c in care of family, encouraged to try eating lunch.   Therapy Documentation Precautions:  Precautions Precautions: Back Precaution Comments: re-educated on BLT, pt cont to be reminded during function Required Braces or Orthoses: Spinal Brace Spinal Brace: Thoracolumbosacral orthotic, Applied in sitting position Restrictions Weight Bearing Restrictions: No   Therapy/Group: Individual Therapy  Netta Corrigan, PT, DPT 07/25/2018, 8:02 AM

## 2018-07-25 NOTE — Progress Notes (Signed)
Occupational Therapy Session Note  Patient Details  Name: Brian [name] MRN: [id number] Date of Birth: 1946-03-01  Today's Date: 07/25/2018 OT Individual Time: 1540-0867 OT Individual Time Calculation (min): 85 min    Short Term Goals: Week 4:  OT Short Term Goal 1 (Week 4): Pt will complete toileting task with assist +1 in order to reduce caregiver burden OT Short Term Goal 2 (Week 4): Pt will verbalize correct set-up of w/c in prep for sliding board transfer with no more than 1 VC OT Short Term Goal 3 (Week 4): Pt will transfer to EOB with supervision using hospital bed functions with supervision in prep for ADL task  Skilled Therapeutic Interventions/Progress Updates:    Pt received supine in bed asleep, easily awoken to vc. Pt c/o pain as described below, RN entering room to administer medication. Pt using urinal at bed level with set up. Pt frustrated d/t incontinent BM during urinating. Emotional support provided. Pt becoming increasingly frustrated with multiple staff members in the room. Emotional support and therapeutic touch provided. Pt initially receptive and then shutting down, ignoring staff member and family attempts to direct pt. Therapeutic use of self to increase pt self efficacy, including guiding family members in their approach of pt. Allowed RN to finish job to ensure only 1 request at a time for pt to decrease feeling overwhelmed. Pt given ample rest breaks for both pain management and emotional break. Pt eventually encouraged to complete peri hygiene to which he agreed. Pt rolled R with min A, increased time requiring for line management. Max A for peri hygiene posteriorly, and pt able to complete hygiene anteriorly. Pt donned shorts supine with mod A. Pt thanking therapist at end of session for patience and more pleasant. Pt left supine with all needs met, wife present.   Therapy Documentation Precautions:  Precautions Precautions: Back Precaution Comments:  re-educated on BLT, pt cont to be reminded during function Required Braces or Orthoses: Spinal Brace Spinal Brace: Thoracolumbosacral orthotic, Applied in sitting position Restrictions Weight Bearing Restrictions: No Vital Signs: Therapy Vitals Temp: 98.6 F (37 C) Temp Source: Oral Pulse Rate: (!) 105 Resp: 14 BP: 115/66 Patient Position (if appropriate): Sitting Oxygen Therapy SpO2: 94 % O2 Device: Nasal Cannula Pain: Pain Assessment Pain Scale: 0-10 Pain Score: 7  Pain Type: Acute pain Pain Location: Back Pain Orientation: Mid Pain Descriptors / Indicators: Aching Pain Frequency: Intermittent Pain Onset: On-going Pain Intervention(s): Medication (See eMAR)   Therapy/Group: Individual Therapy  Curtis Sites 07/25/2018, 8:56 AM

## 2018-07-25 NOTE — Plan of Care (Signed)
  Problem: Consults Goal: RH SPINAL CORD INJURY PATIENT EDUCATION Description  See Patient Education module for education specifics.  Outcome: Progressing Goal: Skin Care Protocol Initiated - if Braden Score 18 or less Description If consults are not indicated, leave blank or document N/A Outcome: Progressing   Problem: SCI BOWEL ELIMINATION Goal: RH STG MANAGE BOWEL WITH ASSISTANCE Description STG Manage Bowel with Mod.Assistance.  Outcome: Progressing Goal: RH STG SCI MANAGE BOWEL WITH MEDICATION WITH ASSISTANCE Description STG SCI Manage bowel with medication with Mod.assistance.  Outcome: Progressing Goal: RH STG MANAGE BOWEL W/EQUIPMENT W/ASSISTANCE Description STG Manage Bowel With Equipment With mod. Assistance  Outcome: Progressing Goal: RH STG SCI MANAGE BOWEL PROGRAM W/ASSIST OR AS APPROPRIATE Description STG SCI Manage bowel program w/mod.assist or as appropriate.  Outcome: Progressing   Problem: SCI BLADDER ELIMINATION Goal: RH STG MANAGE BLADDER WITH ASSISTANCE Description STG Manage Bladder With mod.Assistance  Outcome: Progressing   Problem: RH SKIN INTEGRITY Goal: RH STG SKIN FREE OF INFECTION/BREAKDOWN Description With mod. Assist.  Outcome: Progressing Goal: RH STG MAINTAIN SKIN INTEGRITY WITH ASSISTANCE Description STG Maintain Skin Integrity With Mod.Assistance.  Outcome: Progressing Goal: RH STG ABLE TO PERFORM INCISION/WOUND CARE W/ASSISTANCE Description STG Able To Perform Incision/Wound Care With Mod.Assistance.  Outcome: Progressing   Problem: RH SAFETY Goal: RH STG ADHERE TO SAFETY PRECAUTIONS W/ASSISTANCE/DEVICE Description STG Adhere to Safety Precautions With Mod.Assistance/Device.  Outcome: Progressing   Problem: RH PAIN MANAGEMENT Goal: RH STG PAIN MANAGED AT OR BELOW PT'S PAIN GOAL Description Less than 3,on 1 to 10 scale.  Outcome: Progressing

## 2018-07-25 NOTE — Progress Notes (Signed)
Patient ID: Brian [name], male   DOB: 02/13/1946, 73 y.o.   MRN: [id number] Subjective: 4 Days Post-Op Procedure(s) (LRB): LUMBAR WOUND DEBRIDEMENT (N/A)    Patient reports pain as mild to moderate depending on activity No events  Objective:   VITALS:   Vitals:   07/25/18 0611 07/25/18 0613  BP: 115/73 115/66  Pulse: (!) 105 (!) 105  Resp: 14   Temp: 98.6 F (37 C)   SpO2: 94%     Low back wound vac functioning  LABS Recent Labs    07/23/18 0429  HGB 8.8*  HCT 30.8*  WBC 19.5*  PLT 351    Recent Labs    07/23/18 0429  NA 138  K 3.8  BUN 13  CREATININE 0.55*  GLUCOSE 93    No results for input(s): LABPT, INR in the last 72 hours.   Assessment/Plan: 4 Days Post-Op Procedure(s) (LRB): LUMBAR WOUND DEBRIDEMENT (N/A)  Plan per Rolena Infante - Orthopaedically Continue current treatment plan Back wound to be evaluated tomorrow to determine need for further treatment ir continue wound vac versus delayed closure Otherwise medical management

## 2018-07-25 NOTE — Progress Notes (Signed)
[facility] PHYSICAL MEDICINE & REHABILITATION PROGRESS NOTE  Subjective/Complaints:   Initiate interventional radiology note.  Patient did not recall which day his procedure would be nursing indicates that it will be in a.m. The patient did not sleep very well approximately 5 hours last night.  He received a hydrocodone approximately 1/2-hour prior to my visit. The patient does not remember me from yesterday.     ROS: Denies CP, shortness of breath, nausea, vomiting, diarrhea.  Objective: Vital Signs: Blood pressure 115/66, pulse (!) 105, temperature 98.6 F (37 C), temperature source Oral, resp. rate 14, height '5\' 10"'$  (1.778 m), weight 85.4 kg, SpO2 94 %. No results found. Recent Labs    07/23/18 0429  WBC 19.5*  HGB 8.8*  HCT 30.8*  PLT 351   Recent Labs    07/23/18 0429  NA 138  K 3.8  CL 101  CO2 28  GLUCOSE 93  BUN 13  CREATININE 0.55*  CALCIUM 8.1*    Physical Exam: BP 115/66 (BP Location: Left Arm)   Pulse (!) 105   Temp 98.6 F (37 C) (Oral)   Resp 14   Ht '5\' 10"'$  (1.778 m)   Wt 85.4 kg   SpO2 94%   BMI 27.01 kg/m  Constitutional: No distress . Vital signs reviewed. HENT: Normocephalic.  Atraumatic. Eyes: EOMI. No discharge. Cardiovascular: RRR.  No JVD. Respiratory: CTA bilaterally.  Normal effort.  + [place] GI: BS +. Non-distended. Musculoskeletal: Bilateral lower extremity edema, left greater than right, improving Neurological: He is alert and oriented  Dysphonia Able to follow basic commands without difficulty.  Motor:  Right lower extremity: 3+/5 proximal distal, pain inhibition, improving Left lower extremity: Hip flexion 3-/5, knee extension 4-/5, ankle dorsiflexion 4/5, some pain inhibition, improving Skin: Warm and dry. Intact.  Back incision with VAC Psychiatric: Flat  Assessment/Plan: 1. Functional deficits secondary to lung cancer with mets to ribs, vertebrae with oncological fracture status post lumbar fusion which require 3+  hours per day of interdisciplinary therapy in a comprehensive inpatient rehab setting.  Physiatrist is providing close team supervision and 24 hour management of active medical problems listed below.  Physiatrist and rehab team continue to assess barriers to discharge/monitor patient progress toward functional and medical goals  Care Tool:  Bathing  Bathing activity did not occur: Safety/medical concerns(Unable to assess 2/2 drop in BP with upright positioning) Body parts bathed by patient: Right arm, Left arm, Chest, Abdomen, Front perineal area, Right upper leg, Left upper leg, Face   Body parts bathed by helper: Right lower leg, Left lower leg, Buttocks     Bathing assist Assist Level: Moderate Assistance - Patient 50 - 74%     Upper Body Dressing/Undressing Upper body dressing Upper body dressing/undressing activity did not occur (including orthotics): Safety/medical concerns(B/P issues ) What is the patient wearing?: Orthosis Orthosis activity level: Performed by helper  Upper body assist Assist Level: Total Assistance - Patient < 25%    Lower Body Dressing/Undressing Lower body dressing      What is the patient wearing?: Pants, Incontinence brief     Lower body assist Assist for lower body dressing: 2 Helpers     Toileting Toileting Toileting Activity did not occur (Clothing management and hygiene only): Safety/medical concerns(Unable to assess 2/2 drop in BP with upright positioning)  Toileting assist Assist for toileting: 2 Helpers     Transfers Chair/bed transfer  Transfers assist     Chair/bed transfer assist level: Contact Guard/Touching assist  Locomotion Ambulation   Ambulation assist   Ambulation activity did not occur: Safety/medical concerns  Assist level: Minimal Assistance - Patient > 75% Assistive device: Walker-rolling Max distance: 23'   Walk 10 feet activity   Assist     Assist level: Moderate Assistance - Patient - 50 -  74% Assistive device: Walker-rolling   Walk 50 feet activity   Assist           Walk 150 feet activity   Assist           Walk 10 feet on uneven surface  activity   Assist           Wheelchair     Assist Will patient use wheelchair at discharge?: Yes Type of Wheelchair: Manual Wheelchair activity did not occur: Safety/medical concerns(hypotensive)  Wheelchair assist level: Supervision/Verbal cueing Max wheelchair distance: 150    Wheelchair 50 feet with 2 turns activity    Assist    Wheelchair 50 feet with 2 turns activity did not occur: Safety/medical concerns   Assist Level: Supervision/Verbal cueing   Wheelchair 150 feet activity     Assist Wheelchair 150 feet activity did not occur: Safety/medical concerns   Assist Level: Supervision/Verbal cueing      Medical Problem List and Plan: 1.Functional and mobility deficitssecondary to metastatic lung cancer to left clavicle, ribs, L4 vertebral body with fracture. Pt is s/p L4 corpectomy and L3-5 fusion by Dr. Rolena Infante on 06/11/18  Continue CIR-  Per hematology/oncology, new chest wall as well as lung mass on CT chest from 1/30  Rad-onc involved. CHest wall biopsy ordered  Status post I&D of back wound 1/17 -repeat I and D with wound vac placement POD #4 2. DVT: dopplers done this evening and patient has DVT in left CFV             IVC filter placement on 1/8  Pt not a candidate for anticoagulation due to hematoma and bleeding riskat present although long term he is high VTE risk 3. Pain Management: dilaudid prn. Unable to tolerate Oxycodone   Fentanyl increased to 25 mcg on 1/17, increased to 50 on 1/21- cont current dose  Appreciate palliative care consult- notes reviewed  Cymbalta added on 1/17, increased on 1/24  Right coracoid met- no biceps curls  Voltaren gel added on 1/23 with improvement  Relatively controlled on 1/28 Hydrocodone for moderate and dilaudid for severe  breakthough pain 4. Mood:LCSW to follow for evaluation and support.  Cymbalta added  Patient more calm when daughter not present 5. Neuropsych: This patientiscapable of making decisions on hisown behalf. 6. Skin/Wound Care:  VAC in place on back wound per ortho, Ortho to recheck in a.m. with W OC 7. Fluids/Electrolytes/Nutrition:Monitor I/O.  8.Blood pressure:  Abdominal binder as needed  Relatively controlled on 1/28, orthostatics negative  Monitor with increased mobility 9. ABLA: Continue to monitor with serial checks.   Hemoglobin 9.0 on 1/27- repeat 1/31 8.8  Iron studies consistent with anemia of chronic disease: Ferritin levels high  IV iron 10. Hypokalemia:   Potassium 4.3 on 1/24   Supplement increased on 1/13  Magnesium 2.2 on 1/24   Supplement initiated on 1/14 11. Rash: Continue Clobetasol bidas well as hypoallergenic sheets. 12. Neuropathic pain: Improving on gabapentin 300 mg tid 13. Constipation:Managed with miralax prn--patient wants to manage his bowel program. 14.  Hypoalbuminemia  Supplement initiated on 1/8 15.  Sinus tachycardia  Metoprolol 12.5 twice daily started on 1/13, changed to Coreg on 1/15  Improved 16.  Hyponatremia  Sodium 135 on 1/24  Continue to monitor 17.  Wound infection- now with skin rash which is spreading to legs, was arms only  WBCs 16.2 on 1/27, trending up, await ID/Ortho recs  Labs ordered for tomorrow  Wound cultures with E. coli, pansensitive  Chest x-ray reviewed with patient, unremarkable for acute infectious process, repeat chest x-ray on 1/16 reviewed showing some lower abnormality-continue to monitor  Afebrile  Appreciate ID recs, notes reviewed- IV ceftezole   Antibiotic beads placed by Ortho in wound  Blood cultures no growth  ESR/CRP elevated on 1/22  Back incision as above 18. Peripheral edema  Lasix 20 daily started on 1/15, increased to twice daily on 1/21 local care and elevation   Improving 19.  Sleep  disturbance  Trazodone DC'd, Benadryl ordered for pruritus and sleep on 1/17  Improving.  21.  Multiple joint pain-metastases to left proximal clavicle, right coracoid process    Right shoulder due to coracoid met- some mild tenderness, no biceps curls on right side, LOS: 26 days A FACE TO FACE EVALUATION WAS PERFORMED  Charlett Blake 07/25/2018, 9:05 AM

## 2018-07-26 ENCOUNTER — Inpatient Hospital Stay (HOSPITAL_COMMUNITY): Payer: Medicare Other

## 2018-07-26 ENCOUNTER — Ambulatory Visit (HOSPITAL_COMMUNITY): Payer: Medicare Other | Attending: Hematology & Oncology

## 2018-07-26 DIAGNOSIS — Z515 Encounter for palliative care: Secondary | ICD-10-CM

## 2018-07-26 DIAGNOSIS — Z7189 Other specified counseling: Secondary | ICD-10-CM

## 2018-07-26 DIAGNOSIS — C7989 Secondary malignant neoplasm of other specified sites: Secondary | ICD-10-CM | POA: Insufficient documentation

## 2018-07-26 DIAGNOSIS — C7801 Secondary malignant neoplasm of right lung: Secondary | ICD-10-CM | POA: Insufficient documentation

## 2018-07-26 DIAGNOSIS — C801 Malignant (primary) neoplasm, unspecified: Secondary | ICD-10-CM | POA: Diagnosis not present

## 2018-07-26 DIAGNOSIS — C493 Malignant neoplasm of connective and soft tissue of thorax: Secondary | ICD-10-CM | POA: Diagnosis not present

## 2018-07-26 LAB — PROTIME-INR
INR: 1.21
Prothrombin Time: 15.2 seconds (ref 11.4–15.2)

## 2018-07-26 LAB — CBC WITH DIFFERENTIAL/PLATELET
Abs Immature Granulocytes: 0.24 10*3/uL — ABNORMAL HIGH (ref 0.00–0.07)
Basophils Absolute: 0.1 10*3/uL (ref 0.0–0.1)
Basophils Relative: 0 %
EOS ABS: 0.2 10*3/uL (ref 0.0–0.5)
Eosinophils Relative: 1 %
HCT: 30.9 % — ABNORMAL LOW (ref 39.0–52.0)
Hemoglobin: 8.9 g/dL — ABNORMAL LOW (ref 13.0–17.0)
IMMATURE GRANULOCYTES: 1 %
Lymphocytes Relative: 11 %
Lymphs Abs: 1.9 10*3/uL (ref 0.7–4.0)
MCH: 26.7 pg (ref 26.0–34.0)
MCHC: 28.8 g/dL — ABNORMAL LOW (ref 30.0–36.0)
MCV: 92.8 fL (ref 80.0–100.0)
Monocytes Absolute: 1.3 10*3/uL — ABNORMAL HIGH (ref 0.1–1.0)
Monocytes Relative: 7 %
NEUTROS ABS: 13.3 10*3/uL — AB (ref 1.7–7.7)
Neutrophils Relative %: 80 %
Platelets: 349 10*3/uL (ref 150–400)
RBC: 3.33 MIL/uL — ABNORMAL LOW (ref 4.22–5.81)
RDW: 15.9 % — AB (ref 11.5–15.5)
WBC: 17 10*3/uL — ABNORMAL HIGH (ref 4.0–10.5)
nRBC: 0 % (ref 0.0–0.2)

## 2018-07-26 LAB — BASIC METABOLIC PANEL
Anion gap: 12 (ref 5–15)
BUN: 13 mg/dL (ref 8–23)
CO2: 26 mmol/L (ref 22–32)
Calcium: 7.2 mg/dL — ABNORMAL LOW (ref 8.9–10.3)
Chloride: 104 mmol/L (ref 98–111)
Creatinine, Ser: 0.57 mg/dL — ABNORMAL LOW (ref 0.61–1.24)
GFR calc Af Amer: >60 mL/min (ref >60)
GFR calc non Af Amer: >60 mL/min (ref >60)
Glucose, Bld: 111 mg/dL — ABNORMAL HIGH (ref 70–99)
Potassium: 3.5 mmol/L (ref 3.5–5.1)
Sodium: 142 mmol/L (ref 135–145)

## 2018-07-26 MED ORDER — MIDAZOLAM HCL 2 MG/2ML IJ SOLN
INTRAMUSCULAR | Status: AC
  Filled 2018-07-26: qty 4

## 2018-07-26 MED ORDER — LIDOCAINE-EPINEPHRINE 1 %-1:100000 IJ SOLN
INTRAMUSCULAR | Status: AC
  Filled 2018-07-26: qty 1

## 2018-07-26 MED ORDER — FENTANYL CITRATE (PF) 100 MCG/2ML IJ SOLN
INTRAMUSCULAR | Status: AC
  Filled 2018-07-26: qty 4

## 2018-07-26 MED ORDER — MIDAZOLAM HCL 2 MG/2ML IJ SOLN
INTRAMUSCULAR | Status: AC | PRN
  Administered 2018-07-26: 1 mg via INTRAVENOUS

## 2018-07-26 MED ORDER — FENTANYL CITRATE (PF) 100 MCG/2ML IJ SOLN
INTRAMUSCULAR | Status: AC | PRN
  Administered 2018-07-26: 25 ug via INTRAVENOUS

## 2018-07-26 NOTE — Consult Note (Signed)
[facility] Nurse wound follow up Patient receiving care in 4M10.  Per his primary nurse, Ed, the patient is refusing most all treatment, meds, interventions.  There was a "Do not disturb" sign on his door.  I spoke with his daughter in the hall.  She informed me that the surgeon "wants to see the wound" today and that the patient hasn't had pain medication since yesterday.   I gave the primary nurse, Ed, my pager number and asked him to page me when all parties decide how they want to proceed. Val Riles, RN, MSN, CWOCN, CNS-BC, pager [phone]

## 2018-07-26 NOTE — Progress Notes (Signed)
Pharmacy Antibiotic Note  Brian [name] is a 73 y.o. male admitted on 06/29/2018.  Pharmacy has been consulted for antibiotic recommendation with rash to cefazolin.  On IV abx for back wound. Grew pan sensitive E. Coli in cx. Has been on cefazolin for over 1 week and slowly developed a rash that started on his arms and spread to his legs. MD reports has moderately erythematous and the patient reports mild pruritus.   Discussed with MD. With mild reaction that developed slowly, will stop cefazolin and trial ceftriaxone as would have less cross reactivity then if we were to trial Unasyn / ampicillin. Already has cream to apply to rash on medication list.   Rn reports of no further rash on ceftriaxone.   Plan: Cont ceftriaxone until 2/17 Rx signs off  Height: 5\' 10"  (177.8 cm) Weight: 188 lb 4.4 oz (85.4 kg) IBW/kg (Calculated) : 73  Temp (24hrs), Avg:98.7 F (37.1 C), Min:98.6 F (37 C), Max:98.8 F (37.1 C)  Recent Labs  Lab 07/21/18 0230 07/23/18 0429  WBC 15.9* 19.5*  CREATININE 0.51* 0.55*    Estimated Creatinine Clearance: 84.9 mL/min (A) (by C-G formula based on SCr of 0.55 mg/dL (L)).    Allergies  Allergen Reactions  . Oxycodone     Severe N/V  . Cefazolin Rash    Tolerates ceftriaxone fine  . Minoxidil Palpitations  . Nicotine Palpitations  . Sulfamethoxazole Other (See Comments)    Both parents allergic, avoids med   Onnie Boer, PharmD, [place], AAHIVP, CPP Infectious Disease Pharmacist 07/26/2018 12:37 PM

## 2018-07-26 NOTE — Progress Notes (Signed)
[facility] PHYSICAL MEDICINE & REHABILITATION PROGRESS NOTE  Subjective/Complaints:   Long discussion with pt's daughter regarding pt's refusal of care including prn pain meds (pt does have fentanyl patch). Daughter states he is not "talking out of his head" but is unsure if pt is "giving up" or just very tired  Pt not in pain, did refuse labs this am   ROS: Denies CP, shortness of breath, nausea, vomiting, diarrhea.  Objective: Vital Signs: Blood pressure 116/72, pulse 97, temperature 98.6 F (37 C), temperature source Oral, resp. rate 16, height '5\' 10"'$  (1.778 m), weight 85.4 kg, SpO2 91 %. No results found. No results for input(s): WBC, HGB, HCT, PLT in the last 72 hours. No results for input(s): NA, K, CL, CO2, GLUCOSE, BUN, CREATININE, CALCIUM in the last 72 hours.  Physical Exam: BP 116/72 (BP Location: Left Arm)   Pulse 97   Temp 98.6 F (37 C) (Oral)   Resp 16   Ht '5\' 10"'$  (1.778 m)   Wt 85.4 kg   SpO2 91%   BMI 27.01 kg/m  Constitutional: No distress . Vital signs reviewed. HENT: Normocephalic.  Atraumatic. Eyes: EOMI. No discharge. Cardiovascular: RRR.  No JVD. Respiratory: CTA bilaterally.  Normal effort.  + [place] GI: BS +. Non-distended. Musculoskeletal: Bilateral lower extremity edema, left greater than right, improving Neurological: He is alert and oriented  Dysphonia Able to follow basic commands without difficulty.  Motor:  Right lower extremity: 3+/5 proximal distal, pain inhibition, improving Left lower extremity: Hip flexion 3-/5, knee extension 4-/5, ankle dorsiflexion 4/5, some pain inhibition, improving Skin: Warm and dry. Intact.  Back incision with VAC Psychiatric: Flat  Assessment/Plan: 1. Functional deficits secondary to lung cancer with mets to ribs, vertebrae with oncological fracture status post lumbar fusion which require 3+ hours per day of interdisciplinary therapy in a comprehensive inpatient rehab setting.  Physiatrist is providing  close team supervision and 24 hour management of active medical problems listed below.  Physiatrist and rehab team continue to assess barriers to discharge/monitor patient progress toward functional and medical goals  Care Tool:  Bathing  Bathing activity did not occur: Safety/medical concerns(Unable to assess 2/2 drop in BP with upright positioning) Body parts bathed by patient: Right arm, Left arm, Chest, Abdomen, Front perineal area, Right upper leg, Left upper leg, Face   Body parts bathed by helper: Right lower leg, Left lower leg, Buttocks     Bathing assist Assist Level: Moderate Assistance - Patient 50 - 74%     Upper Body Dressing/Undressing Upper body dressing Upper body dressing/undressing activity did not occur (including orthotics): Safety/medical concerns(B/P issues ) What is the patient wearing?: Orthosis Orthosis activity level: Performed by helper  Upper body assist Assist Level: Total Assistance - Patient < 25%    Lower Body Dressing/Undressing Lower body dressing      What is the patient wearing?: Pants, Incontinence brief     Lower body assist Assist for lower body dressing: 2 Helpers     Toileting Toileting Toileting Activity did not occur (Clothing management and hygiene only): Safety/medical concerns(Unable to assess 2/2 drop in BP with upright positioning)  Toileting assist Assist for toileting: 2 Helpers     Transfers Chair/bed transfer  Transfers assist     Chair/bed transfer assist level: Moderate Assistance - Patient 50 - 74%     Locomotion Ambulation   Ambulation assist   Ambulation activity did not occur: Safety/medical concerns  Assist level: Minimal Assistance - Patient > 75% Assistive device:  Walker-rolling Max distance: 23'   Walk 10 feet activity   Assist     Assist level: Moderate Assistance - Patient - 50 - 74% Assistive device: Walker-rolling   Walk 50 feet activity   Assist           Walk 150 feet  activity   Assist           Walk 10 feet on uneven surface  activity   Assist           Wheelchair     Assist Will patient use wheelchair at discharge?: Yes Type of Wheelchair: Manual Wheelchair activity did not occur: Safety/medical concerns(hypotensive)  Wheelchair assist level: Supervision/Verbal cueing Max wheelchair distance: 150    Wheelchair 50 feet with 2 turns activity    Assist    Wheelchair 50 feet with 2 turns activity did not occur: Safety/medical concerns   Assist Level: Supervision/Verbal cueing   Wheelchair 150 feet activity     Assist Wheelchair 150 feet activity did not occur: Safety/medical concerns   Assist Level: Supervision/Verbal cueing      Medical Problem List and Plan: 1.Functional and mobility deficitssecondary to metastatic lung cancer to left clavicle, ribs, L4 vertebral body with fracture. Pt is s/p L4 corpectomy and L3-5 fusion by Dr. Rolena Infante on 06/11/18  Continue CIR-  Per hematology/oncology, new chest wall as well as lung mass on CT chest from 1/30  Rad-onc involved. CHest wall biopsy ordered  Status post I&D of back wound 1/17 -repeat I and D with wound vac placement POD #5- otho to recheck today 2. DVT: dopplers done this evening and patient has DVT in left CFV             IVC filter placement on 1/8  Pt not a candidate for anticoagulation due to hematoma and bleeding riskat present although long term he is high VTE risk 3. Pain Management: dilaudid prn. Unable to tolerate Oxycodone   Fentanyl increased to 25 mcg on 1/17, increased to 50 on 1/21- cont current dose  Appreciate palliative care consult- notes reviewed  Cymbalta added on 1/17, increased on 1/24  Right coracoid met- no biceps curls  Voltaren gel added on 1/23 with improvement  Relatively controlled on 1/28 Hydrocodone for moderate and dilaudid for severe breakthough pain 4. Mood:LCSW to follow for evaluation and support.  Cymbalta  added  Patient more calm when daughter not present 5. Neuropsych: This patientiscapable of making decisions on hisown behalf. 6. Skin/Wound Care:  VAC in place on back wound per ortho, Ortho to recheck today - plan is to OR for closure if wound looking ok             7. Fluids/Electrolytes/Nutrition:Monitor I/O.  8.Blood pressure:  Abdominal binder as needed  Relatively controlled on 1/28, orthostatics negative  Monitor with increased mobility 9. ABLA: Continue to monitor with serial checks.   Hemoglobin 9.0 on 1/27- repeat 1/31 8.8  Iron studies consistent with anemia of chronic disease: Ferritin levels high  IV iron 10. Hypokalemia:   Potassium 4.3 on 1/24   Supplement increased on 1/13  Magnesium 2.2 on 1/24   Supplement initiated on 1/14 11. Rash: Continue Clobetasol bidas well as hypoallergenic sheets. 12. Neuropathic pain: Improving on gabapentin 300 mg tid 13. Constipation:Managed with miralax prn--patient wants to manage his bowel program. 14.  Hypoalbuminemia  Supplement initiated on 1/8 15.  Sinus tachycardia  Metoprolol 12.5 twice daily started on 1/13, changed to Coreg on 1/15  Improved 16.  Hyponatremia  Sodium 135 on 1/24  Continue to monitor 17.  Wound infection- now with skin rash which is spreading to legs, was arms only  WBCs 16.2 on 1/27, trending up, await ID/Ortho recs  Labs ordered for tomorrow  Wound cultures with E. coli, pansensitive  Chest x-ray reviewed with patient, unremarkable for acute infectious process, repeat chest x-ray on 1/16 reviewed showing some lower abnormality-continue to monitor  Afebrile  Appreciate ID recs, notes reviewed- IV ceftezole   Antibiotic beads placed by Ortho in wound  Blood cultures no growth  ESR/CRP elevated on 1/22  Back incision as above 18. Peripheral edema  Lasix 20 daily started on 1/15, increased to twice daily on 1/21 local care and elevation   Improving 19.  Sleep disturbance  Trazodone DC'd,  Benadryl ordered for pruritus and sleep on 1/17  Improving.  21.  Multiple joint pain-metastases to left proximal clavicle, right coracoid process    Right shoulder due to coracoid met- some mild tenderness, no biceps curls on right side, 22.  Palliative care- pt DNR, given pt's refusal of some meds and labs would like another discussion with Palliative team.  Pt wishes to have biopsy today  Pt's daughter has questions regarding immunotherapy       35 min spent today counseling and coordination of care                LOS: 27 days A FACE TO [place] 07/26/2018, 8:23 AM

## 2018-07-26 NOTE — Procedures (Signed)
Pre procedural Dx: History of lung cancer, now with right lateral chest wall mass  Post procedural Dx: Same  Technically successful CT and US guided biopsy of indeterminate mass involving the right lateral chest wall.    EBL: None.   Complications: None immediate.   Ronny Bacon, MD Pager #: [phone]

## 2018-07-26 NOTE — Progress Notes (Signed)
Occupational Therapy Session Note  Patient Details  Name: Brian [name] MRN: [id number] Date of Birth: [birth year]  Today's Date: 07/26/2018 OT Individual Time: 0950-1000 OT Individual Time Calculation (min): 10 min  and Today's Date: 07/26/2018 OT Missed Time: 65 Minutes Missed Time Reason: Patient unwilling/refused to participate without medical reason;Patient fatigue   Short Term Goals: Week 4:  OT Short Term Goal 1 (Week 4): Pt will complete toileting task with assist +1 in order to reduce caregiver burden OT Short Term Goal 2 (Week 4): Pt will verbalize correct set-up of w/c in prep for sliding board transfer with no more than 1 VC OT Short Term Goal 3 (Week 4): Pt will transfer to EOB with supervision using hospital bed functions with supervision in prep for ADL task  Skilled Therapeutic Interventions/Progress Updates:    Long discussion with pt's daughter (not included in billing) re pt status and mindset today. Provided as much edu as within scope to daughter. Entered room and spoke with pt at length. Therapeutic touch provided to comfort and provide emotional support to pt. Pt refusing any participation with therapy, nervous about biopsy today. Pt left supine, no further requests/needs.   Therapy Documentation Precautions:  Precautions Precautions: Back Precaution Comments: re-educated on BLT, pt cont to be reminded during function Required Braces or Orthoses: Spinal Brace Spinal Brace: Thoracolumbosacral orthotic, Applied in sitting position Restrictions Weight Bearing Restrictions: No    Vital Signs: Therapy Vitals Temp: (Refused Vitals ) Pain:  No number given to pain, just answered yes when questioned.    Therapy/Group: Individual Therapy  Curtis Sites 07/26/2018, 7:18 AM

## 2018-07-26 NOTE — Consult Note (Signed)
[facility] Nurse wound follow up Patient receiving care in [facility] 4M10.  Spouse, daughter and Dr. Rolena Infante present. Wound type: Surgical Measurement: Not measured Wound bed: Both wound beds to back surgical sites clean, pink, granulation tissue.  The lower wound has some yellow tissue in the wound bed. No odor to either site.  Dr. Rolena Infante very pleased with appearance.  I understand he is planning to take the patient to surgery for wound closure on Wednesday of this week. Drainage (amount, consistency, odor) serosanginous in the VAC cannister Periwound: Intact, normal color and texture Dressing procedure/placement/frequency: One piece of black foam removed from each wound site.  One piece of black foam placed into each wound. Wounds bridged to left flank. Immediate seal obtained.  Patient tolerated the dressing change very well. Val Riles, RN, MSN, CWOCN, CNS-BC, pager [phone]

## 2018-07-26 NOTE — Progress Notes (Signed)
Had multiple discussions today with wife alone, patient and family as well as daughter alone. They have questions as to need for biopsy and patient initially refused but ultimately was agreeable as dicussed need for full work up in order to help decide on available treatment options. All three seem to be on different page as to goals of care. Patient reports that he "want to live" but aware that he is not up to systemic chemo.  He was depressed this weekend and refused multiple meds as well as intake.    Daughter is aware of diagnosis but does not want to "give up hope". Wife with questions regarding hospice --would like to talk with Dr. Arelia Sneddon as she feels that she is in limbo. Message left for Dr. Marin Olp to contact wife.

## 2018-07-26 NOTE — Significant Event (Signed)
Pt resistant to care. Refused evening meds. Noted to be refusing personal care and scheduled labs. Daughter at bedside throughout the night. Pt and family educated.

## 2018-07-26 NOTE — Progress Notes (Signed)
Pt refusing labs

## 2018-07-26 NOTE — Progress Notes (Signed)
Physical Therapy Session Note  Patient Details  Name: Brian [name] MRN: [id number] Date of Birth: Jun 01, 1946  Today's Date: 07/26/2018   -     Short Term Goals:  Week 4:  PT Short Term Goal 1 (Week 4): = LTGs due to ELOS  Skilled Therapeutic Interventions/Progress Updates:  Pt off unit getting needle biopsy.  Wife, Brian [name] stated that results will be in 2-3 days. Wife is very concerned about coordinating timing of DME and d/c.  Pt may be going home with Hospice coverage. Wife is not sure if she will have ramp built, depending upon prognosis.  Pt has appt for custom w/c with Brian [name] for Wed, 2/5 at 9am.     Therapy Documentation Precautions:  Precautions Precautions: Back Precaution Comments: re-educated on BLT, pt cont to be reminded during function Required Braces or Orthoses: Spinal Brace Spinal Brace: Thoracolumbosacral orthotic, Applied in sitting position Restrictions Weight Bearing Restrictions: No General: PT Amount of Missed Time (min): 45 Minutes PT Missed Treatment Reason: Unavailable (Comment)(biopsy)    Therapy/Group: Individual Therapy  Brian [name] 07/26/2018, 3:03 PM

## 2018-07-26 NOTE — Progress Notes (Signed)
Daily Progress Note   Patient Name: Brian [name]       Date: 07/26/2018 DOB: 1945/08/23  Age: 73 y.o. MRN#: [id number] Attending Physician: Jamse Arn, MD Primary Care Physician: [name], MD Admit Date: 06/29/2018  Reason for Consultation/Follow-up: Establishing goals of care  Subjective: Upon arrival to room, patient leaving to go to biopsy.   GOC:   F/u supportive care to wife Brian [name]) and daughter Brian [name]) at bedside.   They share that Brian [name] had a bad weekend, which they think is due to news of worsening cancer from repeat CT scan. He originally was refusing biopsy this morning but after further conversation with Brian Patella, PA he spoke of the desire to live ("I want to live") and changed his mind about going for biopsy.   Brian [name] is very overwhelmed and asking many questions regarding discharge home. She asks questions about hospice services and philosophy. Discussed continued aggressive medical interventions versus role of comfort measures including symptom management. [name] and Brian [name] are hopeful that he will do better once he gets back home but also wish to honor his wishes, whether that is to continue aggressive medical interventions vs. Comfort measures.   Brian [name] is considering having a ramp built at home. She is also looking into private duty caregivers along with home health.   Emotional support provided. Brian [name] has PMT contact information.    Length of Stay: 27  Current Medications: Scheduled Meds:  . carvedilol  6.25 mg Oral BID WC  . clobetasol ointment   Topical BID  . diclofenac sodium  2 g Topical QID  . DULoxetine  60 mg Oral Daily  . feeding supplement (PRO-STAT SUGAR FREE 64)  30 mL Oral TID WC & HS  . fentaNYL  1 patch Transdermal Q72H  . fentaNYL        . furosemide  20 mg Oral BID  . gabapentin  300 mg Oral TID  . guaiFENesin  10 mL Oral Q6H  . hydrocerin   Topical BID  . HYDROcodone-acetaminophen  1 tablet Oral QHS  . lidocaine-EPINEPHrine      . magnesium oxide  200 mg Oral Daily  . methocarbamol  750 mg Oral QID  . midazolam      . pantoprazole  40 mg Oral Daily  . potassium chloride  30 mEq  Oral BID  . senna-docusate  2 tablet Oral QPC supper  . tamsulosin  0.4 mg Oral QPC supper    Continuous Infusions: . sodium chloride 10 mL/hr at 07/25/18 0922  . cefTRIAXone (ROCEPHIN)  IV 2 g (07/26/18 1012)  . lactated ringers Stopped (07/21/18 1930)    PRN Meds: sodium chloride, camphor-menthol, diphenhydrAMINE, diphenhydrAMINE, heparin lock flush, HYDROcodone-acetaminophen, HYDROmorphone HCl, menthol-cetylpyridinium **OR** phenol, polyethylene glycol, prochlorperazine **OR** prochlorperazine **OR** prochlorperazine, sodium chloride flush, sodium phosphate  Physical Exam Vitals signs and nursing note reviewed.  Constitutional:      Appearance: He is cachectic. He is ill-appearing.  HENT:     Head: Normocephalic and atraumatic.  Pulmonary:     Effort: No tachypnea, accessory muscle usage or respiratory distress.  Abdominal:     Tenderness: There is no abdominal tenderness.  Skin:    General: Skin is warm and dry.     Coloration: Skin is pale.  Neurological:     Mental Status: He is alert and oriented to person, place, and time.  Psychiatric:        Attention and Perception: Attention normal.        Mood and Affect: Mood normal.        Speech: Speech normal.        Behavior: Behavior is cooperative.        Cognition and Memory: Cognition normal.            Vital Signs: BP (!) 128/95   Pulse (!) 116   Temp 98.6 F (37 C) (Oral)   Resp (!) 23   Ht 5\' 10"  (1.778 m)   Wt 85.4 kg   SpO2 93%   BMI 27.01 kg/m  SpO2: SpO2: 93 % O2 Device: O2 Device: Nasal Cannula O2 Flow Rate: O2 Flow Rate (L/min): 424  L/min  Intake/output summary:   Intake/Output Summary (Last 24 hours) at 07/26/2018 1509 Last data filed at 07/26/2018 0100 Gross per 24 hour  Intake 240 ml  Output 325 ml  Net -85 ml   LBM: Last BM Date: 07/24/18 Baseline Weight: Weight: 96.8 kg Most recent weight: Weight: 85.4 kg       Palliative Assessment/Data: PPS 40%     Patient Active Problem List   Diagnosis Date Noted  . Goals of care, counseling/discussion   . Pain   . Pain aggravated by physical activity   . Right hip pain   . Sleep disturbance   . Peripheral edema   . Leukocytosis   . Hyponatremia   . Sinus tachycardia   . Hypomagnesemia   . Orthostatic hypotension   . Labile blood pressure   . Cancer associated pain   . DVT of lower extremity (deep venous thrombosis) ([place])   . Malignant neoplasm of lung ([place])   . Hypoalbuminemia due to protein-calorie malnutrition ([place])   . Acute blood loss anemia   . Postoperative pain   . Pathological compression fracture of lumbar vertebra ([place]) 06/29/2018  . Metastatic lung cancer (metastasis from lung to other site) ([place]) 06/28/2018  . Rash 06/25/2018  . Hypokalemia 06/25/2018  . Normocytic anemia 06/25/2018  . S/P lumbar fusion 06/24/2018  . Palliative care by specialist   . Cancer related pain   . Lung cancer, primary, with metastasis from lung to other site, right ([place]) 06/14/2018  . Squamous cell lung cancer, right ([place]) 06/14/2018  . Lung cancer metastatic to bone ([place]) 06/14/2018  . Bronchitis due to tobacco use 06/07/2018  . Lumbar spine tumor 06/04/2018  Palliative Care Assessment & Plan   Patient Profile: 73 y.o. male  with past medical history of rheumatoid arthritis admitted on 06/04/2018 with low back pain, radicular pain into right buttocks and leg, and rib/left shoulder pain. CT myelogram ordered outpatient and revealed L4 pathologic compression fracture with lytic destruction and extraosseous tumor encroaching into spinal canal and L4-L5  intervertebral foramina. Seen in outpatient ortho office and decision made for direct admission for further workup. 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 Patient s/p LV corpectomy on 12/20 and L3-L5 spinal fusion on 12/22. Hospitalization and course of rehab complicated by left DVT and surgical site wound infection. Oncology following.   Assessment: Metastatic lung cancer  L4 vertebral body fracture s/p L4 corpectomy and L3-5 fusion Left DVT Cancer related pain Lumbar wound infection s/p I&D and wound vac  Recommendations/Plan:  DNR/DNI in the event of cardiac arrest. Otherwise, continue current plan of care and interventions.   Wife arranged inpatient meeting with attorney on 07/27/2018 to complete living will/POA paperwork.   Patient does have a good understanding that his cancer is terminal and incurable. He is hopeful to progress with therapy with goal to return home. He is interested in immunotherapy if offered by oncology. He is agreeable for palliative radiation. Biopsy today.   I did explain outpatient palliative versus hospice options including hospice philosophy to wife and daughter. Today, patient spoke of his desire to "live."   Code Status: DNR   Code Status Orders  (From admission, onward)         Start     Ordered   06/29/18 1526  Full code  Continuous     06/29/18 1525        Code Status History    Date Active Date Inactive Code Status Order ID Comments User Context   06/11/2018 2031 06/29/2018 1440 Full Code [id number]  Milagros Loll, MD Inpatient   06/11/2018 2011 06/11/2018 2030 Full Code [id number]  Melina Schools, MD Inpatient   06/04/2018 1344 06/11/2018 2011 Full Code [id number]  Ardeen Jourdain, PA-C Inpatient       Prognosis:   Poor prognosis with metastatic lung cancer, prolonged hospitalization/rehab stay secondary to DVT and wound infection, ongoing cancer related pain.   Discharge  Planning:  To Be Determined   Care plan was discussed with patient, wife, daughter, Algis Liming PA  Thank you for allowing the Palliative Medicine Team to assist in the care of this patient.   Time In: 1400 Time Out: 1500 Total Time 60 Prolonged Time Billed no      Greater than 50%  of this time was spent counseling and coordinating care related to the above assessment and plan.  Ihor Dow, FNP-C Palliative Medicine Team  Phone: [phone] Fax: [phone]  Please contact Palliative Medicine Team phone at [phone] for questions and concerns.

## 2018-07-26 NOTE — Progress Notes (Signed)
Called to this pt RN on floor. RN states that pt is still refusing care at this time but he will check with pt and pt wife again to make sure. I advised that I will call lab to come back and draw necessary lab work if pt is agreeable.

## 2018-07-26 NOTE — Progress Notes (Signed)
Called to floor to speak with pt RN regarding lab draws. Advised RN that we cannot go forward with procedure without necessary lab work. RN verbalized understanding. RN states that pt is refusing all care at this time. She states that his daughter is in the room with pt and she will inform pt and daughter that lab work is needed.

## 2018-07-26 NOTE — Progress Notes (Signed)
Occupational Therapy Note  Patient Details  Name: Brian [name] MRN: [id number] Date of Birth: 07/14/1945  Today's Date: 07/26/2018 OT Missed Time: 17 Minutes Missed Time Reason: Other (comment)(biopsy)  Pt off unit for biopsy. OT will f/u tomorrow.    Curtis Sites 07/26/2018, 3:32 PM

## 2018-07-27 ENCOUNTER — Inpatient Hospital Stay (HOSPITAL_COMMUNITY): Payer: Medicare Other | Admitting: Occupational Therapy

## 2018-07-27 ENCOUNTER — Inpatient Hospital Stay (HOSPITAL_COMMUNITY): Payer: Medicare Other

## 2018-07-27 DIAGNOSIS — I82502 Chronic embolism and thrombosis of unspecified deep veins of left lower extremity: Secondary | ICD-10-CM

## 2018-07-27 MED ORDER — HYDROCODONE-ACETAMINOPHEN 7.5-325 MG PO TABS
1.0000 | ORAL_TABLET | ORAL | Status: DC | PRN
  Administered 2018-07-27 – 2018-07-30 (×12): 1 via ORAL
  Filled 2018-07-27 (×13): qty 1

## 2018-07-27 NOTE — Progress Notes (Signed)
[facility] PHYSICAL MEDICINE & REHABILITATION PROGRESS NOTE  Subjective/Complaints: Patient seen laying in bed this morning.  Cousin at bedside.  He states he slept well overnight.  He is depressed this morning.  He states oncology stop by this morning.  Per nursing, patient refusing medications.  Biopsy taken yesterday.  ROS: Denies CP, shortness of breath, nausea, vomiting, diarrhea.  Objective: Vital Signs: Blood pressure 114/66, pulse (!) 106, temperature 98.2 F (36.8 C), temperature source Oral, resp. rate 18, height '5\' 10"'$  (1.778 m), weight 85.4 kg, SpO2 96 %. Ct Biopsy  Result Date: 07/26/2018 INDICATION: History of metastatic lung cancer, now with infiltrative lytic mass involving the lateral aspect of the right eighth rib. Please perform image guided biopsy to obtain additional tissue for molecular testing. EXAM: CT AND ULTRASOUND-GUIDED BIOPSY OF LYTIC MASS INVOLVING THE LATERAL ASPECT THE RIGHT EIGHTH RIB COMPARISON:  Chest CT-07/22/2018 MEDICATIONS: None ANESTHESIA/SEDATION: Moderate (conscious) sedation was employed during this procedure. A total of Versed 1 mg and Fentanyl 25 mcg was administered intravenously. Moderate Sedation Time: 14 minutes. The patient's level of consciousness and vital signs were monitored continuously by radiology nursing throughout the procedure under my direct supervision. COMPLICATIONS: None immediate. TECHNIQUE: Informed written consent was obtained from the patient after a discussion of the risks, benefits and alternatives to treatment. Questions regarding the procedure were encouraged and answered. Patient was positioned left lateral decubitus on the CT gantry with CT imaging demonstrating unchanged size and appearance of the approximately 7.5 x 3.2 cm lytic mass destroying the lateral aspect of the right eighth rib (image 70, series 2). The skin site was marked. Ultrasound scanning was able to adequately visualize the lytic lesion The skin overlying the  lesion was prepped and draped in usual sterile fashion. Under direct ultrasound guidance, an 18 gauge coaxial needle was advanced into the lytic lesion. Ultrasound image was saved for procedural documentation purposes. Appropriate positioning was confirmed with CT imaging. Next, under direct ultrasound guidance, 6 core needle biopsies was obtained of the lytic lesion. The coaxial needle was removed and postprocedural imaging was obtained was negative for hematoma or pneumothorax. A dressing was placed. The patient tolerated the procedure well without immediate postprocedural complication. IMPRESSION: Technically successful ultrasound and CT-guided biopsy of infiltrative lytic lesion involving the lateral aspect of the right eighth rib. Electronically Signed   By: Sandi Mariscal M.D.   On: 07/26/2018 16:20   Recent Labs    07/26/18 1214  WBC 17.0*  HGB 8.9*  HCT 30.9*  PLT 349   Recent Labs    07/26/18 1214  NA 142  K 3.5  CL 104  CO2 26  GLUCOSE 111*  BUN 13  CREATININE 0.57*  CALCIUM 7.2*    Physical Exam: BP 114/66 (BP Location: Left Arm)   Pulse (!) 106   Temp 98.2 F (36.8 C) (Oral)   Resp 18   Ht '5\' 10"'$  (1.778 m)   Wt 85.4 kg   SpO2 96%   BMI 27.01 kg/m  Constitutional: No distress . Vital signs reviewed. HENT: Normocephalic.  Atraumatic. Eyes: EOMI. No discharge. Cardiovascular: + Tachycardia.  Regular rhythm.  No JVD. Respiratory: CTA bilaterally.  Normal effort.  + [facility] GI: BS +. Non-distended. Musculoskeletal: No edema or tenderness in extremities Neurological: He is alert and oriented  Dysphonia Able to follow basic commands without difficulty.  Motor:  Right lower extremity: 4/5 proximal distal, pain inhibition, improving Left lower extremity: Hip flexion 4-/5, knee extension 4/5, ankle dorsiflexion 4/5, some pain  inhibition Skin: Warm and dry. Intact. +VAC. Psychiatric: Flat  Assessment/Plan: 1. Functional deficits secondary to lung cancer with mets to ribs,  vertebrae with oncological fracture status post lumbar fusion which require 3+ hours per day of interdisciplinary therapy in a comprehensive inpatient rehab setting.  Physiatrist is providing close team supervision and 24 hour management of active medical problems listed below.  Physiatrist and rehab team continue to assess barriers to discharge/monitor patient progress toward functional and medical goals  Care Tool:  Bathing  Bathing activity did not occur: Safety/medical concerns(Unable to assess 2/2 drop in BP with upright positioning) Body parts bathed by patient: Right arm, Left arm, Chest, Abdomen, Front perineal area, Right upper leg, Left upper leg, Face   Body parts bathed by helper: Right lower leg, Left lower leg, Buttocks     Bathing assist Assist Level: Moderate Assistance - Patient 50 - 74%     Upper Body Dressing/Undressing Upper body dressing Upper body dressing/undressing activity did not occur (including orthotics): Safety/medical concerns(B/P issues ) What is the patient wearing?: Orthosis Orthosis activity level: Performed by helper  Upper body assist Assist Level: Total Assistance - Patient < 25%    Lower Body Dressing/Undressing Lower body dressing      What is the patient wearing?: Pants, Incontinence brief     Lower body assist Assist for lower body dressing: 2 Helpers     Toileting Toileting Toileting Activity did not occur (Clothing management and hygiene only): Safety/medical concerns(Unable to assess 2/2 drop in BP with upright positioning)  Toileting assist Assist for toileting: 2 Helpers     Transfers Chair/bed transfer  Transfers assist     Chair/bed transfer assist level: Moderate Assistance - Patient 50 - 74%     Locomotion Ambulation   Ambulation assist   Ambulation activity did not occur: Safety/medical concerns  Assist level: Minimal Assistance - Patient > 75% Assistive device: Walker-rolling Max distance: 23'   Walk 10  feet activity   Assist     Assist level: Moderate Assistance - Patient - 50 - 74% Assistive device: Walker-rolling   Walk 50 feet activity   Assist           Walk 150 feet activity   Assist           Walk 10 feet on uneven surface  activity   Assist           Wheelchair     Assist Will patient use wheelchair at discharge?: Yes Type of Wheelchair: Manual Wheelchair activity did not occur: Safety/medical concerns(hypotensive)  Wheelchair assist level: Supervision/Verbal cueing Max wheelchair distance: 150    Wheelchair 50 feet with 2 turns activity    Assist    Wheelchair 50 feet with 2 turns activity did not occur: Safety/medical concerns   Assist Level: Supervision/Verbal cueing   Wheelchair 150 feet activity     Assist Wheelchair 150 feet activity did not occur: Safety/medical concerns   Assist Level: Supervision/Verbal cueing      Medical Problem List and Plan: 1.Functional and mobility deficitssecondary to metastatic lung cancer to left clavicle, ribs, L4 vertebral body with fracture. Pt is s/p L4 corpectomy and L3-5 fusion by Dr. Rolena Infante on 06/11/18  Continue CIR  Per hematology/oncology, new chest wall as well as lung mass on CT chest from 1/30.  Rad-onc involved. CHest wall biopsied, results pending. Status post I&D of back wound 1/17 -repeat I and D with wound vac placement, ?plan for closure tomorrow  Notes reviewed, labs reviewed  2. DVT: dopplers done this evening and patient has DVT in left CFV             IVC filter placement on 1/8  Pt not a candidate for anticoagulation due to hematoma and bleeding riskat present although long term he is high VTE risk 3. Pain Management: dilaudid prn. Unable to tolerate Oxycodone   Fentanyl increased to 25 mcg on 1/17, increased to 50 on 1/21- cont current dose  Appreciate palliative care consult- notes reviewed  Cymbalta added on 1/17, increased on 1/24  Right coracoid met- no  biceps curls  Voltaren gel added on 1/23 with improvement  Relatively controlled on 1/28 Hydrocodone for moderate and dilaudid for severe breakthough pain 4. Mood:LCSW to follow for evaluation and support.  Cymbalta added  Patient more calm when daughter not present 5. Neuropsych: This patientiscapable of making decisions on hisown behalf. 6. Skin/Wound Care:  VAC in place on back wound per ortho, plan is to OR for closure tomorrow 7. Fluids/Electrolytes/Nutrition:Monitor I/O.  8.Blood pressure:  Abdominal binder as needed  Relatively controlled on 2/4, orthostatics negative  Monitor with increased mobility 9. ABLA: Continue to monitor with serial checks.   Hemoglobin 8.9 on 2/3  Iron studies consistent with anemia of chronic disease: Ferritin levels high  IV iron 10. Hypokalemia:   Potassium 3.5 on 2/3   Supplement increased on 1/13  Magnesium 2.2 on 1/24   Supplement initiated on 1/14 11. Rash: Continue Clobetasol bidas well as hypoallergenic sheets. 12. Neuropathic pain: Improving on gabapentin 300 mg tid 13. Constipation:Managed with miralax prn--patient wants to manage his bowel program. 14.  Hypoalbuminemia  Supplement initiated on 1/8 15.  Sinus tachycardia  Metoprolol 12.5 twice daily started on 1/13, changed to Coreg on 1/15  Elevated, however patient refusing medications 16.  Hyponatremia: Resolved  Continue to monitor 17.  Wound infection  WBCs 17.0 on 2/3  Wound cultures with E. coli, pansensitive  Chest x-ray reviewed with patient, unremarkable for acute infectious process, repeat chest x-ray on 1/16 reviewed showing some lower abnormality-continue to monitor  Afebrile  Appreciate ID recs, notes reviewed- IV ceftezole   Antibiotic beads placed by Ortho in wound  Blood cultures no growth  ESR/CRP elevated on 1/22  Back incision as above 18. Peripheral edema  Lasix 20 daily started on 1/15, increased to twice daily on 1/21 local care and  elevation   Improving 19.  Sleep disturbance  Trazodone DC'd, Benadryl ordered for pruritus and sleep on 1/17  Improving. 21.  Multiple joint pain-metastases to left proximal clavicle, right coracoid process  Right shoulder due to coracoid met- some mild tenderness, no biceps curls on right side 22.  Palliative care- pt DNR,   See palliative care discussions  Family has discussed with oncology, await further plans for?  Hospice  LOS: 28 days A FACE TO FACE EVALUATION WAS PERFORMED  Verbena Boeding [name] 07/27/2018, 8:58 AM

## 2018-07-27 NOTE — Progress Notes (Signed)
    Subjective: Procedure(s) (LRB): LUMBAR WOUND DEBRIDEMENT (N/A) 6 Days Post-Op  Patient reports pain as 5 on 0-10 scale.  Reports decreased leg pain reports incisional back pain   Positive void Positive bowel movement Positive flatus Negative chest pain or shortness of breath  Objective: Vital signs in last 24 hours: Temp:  [98.2 F (36.8 C)-99.6 F (37.6 C)] 98.2 F (36.8 C) (02/04 0437) Pulse Rate:  [94-116] 106 (02/04 0437) Resp:  [18-23] 18 (02/04 0437) BP: (112-146)/(0-95) 114/66 (02/04 0437) SpO2:  [89 %-96 %] 96 % (02/04 0437)  Intake/Output from previous day: 02/03 0701 - 02/04 0700 In: -  Out: 300 [Urine:300]  Labs: Recent Labs    07/26/18 1214  WBC 17.0*  RBC 3.33*  HCT 30.9*  PLT 349   Recent Labs    07/26/18 1214  NA 142  K 3.5  CL 104  CO2 26  BUN 13  CREATININE 0.57*  GLUCOSE 111*  CALCIUM 7.2*   Recent Labs    07/26/18 1214  INR 1.21    Physical Exam: ABD soft Incision: scant drainage Compartment soft Body mass index is 27.01 kg/m.   Assessment/Plan: Patient stable  Wound: yesterday wound examined - positive pink healthy granulation tissue.  No purulent exudate or drainage noted At this point I have recommended primary wound closure.  The wound is healthier and should heal without further complications I have reviewed Dr Antonieta Pert notes and agree that his comfort should be our primary goal.   He will require IV abx therapy - but this can be done either at home or at hospice facility. I do believe that once the wounds are closed that this will facilitate transfer to either home or hospice facility. The patient and his family are in agreement concerning wound closure Wednesday.  Would plan on d/c end of the week.  Melina Schools, MD [facility] [phone]

## 2018-07-27 NOTE — Anesthesia Preprocedure Evaluation (Addendum)
Anesthesia Evaluation  Patient identified by MRN, date of birth, ID band Patient awake    Reviewed: Allergy & Precautions, NPO status , Patient's Chart, lab work & pertinent test results  History of Anesthesia Complications Negative for: history of anesthetic complications  Airway Mallampati: II  TM Distance: >3 FB Neck ROM: Full    Dental  (+) Teeth Intact, Dental Advisory Given   Pulmonary COPD, Current Smoker,  Metastatic lung cancer Hoarseness  Left base of neck mass   Pulmonary exam normal breath sounds clear to auscultation       Cardiovascular Normal cardiovascular exam Rhythm:Regular Rate:Normal  ECG: Rate 89, Sinus rhythm with frequent and consecutive Premature ventricular complexes   Neuro/Psych negative neurological ROS  negative psych ROS   GI/Hepatic Neg liver ROS, GERD  Medicated and Controlled,  Endo/Other  negative endocrine ROS  Renal/GU negative Renal ROS     Musculoskeletal  (+) Arthritis , narcotic dependent  Abdominal   Peds  Hematology  (+) anemia ,   Anesthesia Other Findings Day of surgery medications reviewed with the patient.  Reproductive/Obstetrics                           Anesthesia Physical  Anesthesia Plan  ASA: IV  Anesthesia Plan: General   Post-op Pain Management:    Induction: Intravenous  PONV Risk Score and Plan: 2 and Ondansetron and Treatment may vary due to age or medical condition  Airway Management Planned: Oral ETT  Additional Equipment:   Intra-op Plan:   Post-operative Plan: Extubation in OR  Informed Consent: I have reviewed the patients History and Physical, chart, labs and discussed the procedure including the risks, benefits and alternatives for the proposed anesthesia with the patient or authorized representative who has indicated his/her understanding and acceptance.   Patient has DNR.  Discussed DNR with patient and  Suspend DNR.   Dental advisory given  Plan Discussed with:   Anesthesia Plan Comments: (Pt would like to be full code in perioperative period. Discussed with patient and wife present.)       Anesthesia Quick Evaluation

## 2018-07-27 NOTE — Progress Notes (Signed)
Daily Progress Note   Patient Name: Brian [name]       Date: 07/27/2018 DOB: Sep 10, 1945  Age: 73 y.o. MRN#: [id number] Attending Physician: Jamse Arn, MD Primary Care Physician: [name], MD Admit Date: 06/29/2018  Reason for Consultation/Follow-up: Establishing goals of care  Subjective: Patient irritable this afternoon. Does not engage in conversation. Attempting to eat lunch. Denies questions or concerns.   GOC:   Wife Brian [name]) requests we speak in family waiting room. Brian [name] very tearful. She appreciates the honest conversation with Dr. Marin Olp last night and shares that Brian [name] may not live past March. She shares her thoughts that he has "given up" since hearing of worsening cancer from CT results. She shares his frustrations that cancer treatments were not able to be initiated sooner because of the need to attempt rehab and become stronger for oncology interventions. Brian [name] speaks of her decision to take [place] home with hospice services and focus on his "comfort" and "quality of life." Jolon spoke with Dr. Marin Olp this morning but has not spoken of this conversation with Brian [name]. He does share that he wants to go home.   Discussed hospice services and philosophy including goal of comfort, quality, peace, and dignity at EOL. Emphasized role of symptom management to ensure comfort and relief from suffering. Discussed plan to help prevent repeat hospitalization. Brian [name] wishes to take him home and spend time together with their cats. She has arranged intermittent caregivers to assist with taking care of him. She plans to have a modified ramp built so she can take him outside if requested.   Discussed symptom management medications. Discussed comfort feeds. Educated on EOL  expectations and provided Gone from my Sight booklet.   I asked Brian [name] if I could help further discuss this with Brian [name] and the family. She does not want to continue to discuss death over and over again with Brian [name]. She knows he will be happier when he is able to get home.   Emotional support provided. Therapeutic listening. Brian [name] has PMT contact information and understands she can call with questions or concerns.    Length of Stay: 28  Current Medications: Scheduled Meds:  . carvedilol  6.25 mg Oral BID WC  . clobetasol ointment   Topical BID  . diclofenac sodium  2 g Topical QID  .  DULoxetine  60 mg Oral Daily  . feeding supplement (PRO-STAT SUGAR FREE 64)  30 mL Oral TID WC & HS  . fentaNYL  1 patch Transdermal Q72H  . furosemide  20 mg Oral BID  . gabapentin  300 mg Oral TID  . guaiFENesin  10 mL Oral Q6H  . hydrocerin   Topical BID  . magnesium oxide  200 mg Oral Daily  . methocarbamol  750 mg Oral QID  . pantoprazole  40 mg Oral Daily  . potassium chloride  30 mEq Oral BID  . senna-docusate  2 tablet Oral QPC supper  . tamsulosin  0.4 mg Oral QPC supper    Continuous Infusions: . sodium chloride 10 mL/hr at 07/25/18 0922  . cefTRIAXone (ROCEPHIN)  IV 2 g (07/27/18 0931)  . lactated ringers Stopped (07/21/18 1930)    PRN Meds: sodium chloride, camphor-menthol, diphenhydrAMINE, diphenhydrAMINE, heparin lock flush, HYDROcodone-acetaminophen, HYDROmorphone HCl, menthol-cetylpyridinium **OR** phenol, polyethylene glycol, prochlorperazine **OR** prochlorperazine **OR** prochlorperazine, sodium chloride flush, sodium phosphate  Physical Exam Vitals signs and nursing note reviewed.  Constitutional:      Appearance: He is cachectic. He is ill-appearing.  HENT:     Head: Normocephalic and atraumatic.  Pulmonary:     Effort: No tachypnea, accessory muscle usage or respiratory distress.  Abdominal:     Tenderness: There is no abdominal tenderness.  Skin:    General: Skin is  warm and dry.     Coloration: Skin is pale.  Neurological:     Mental Status: He is alert and oriented to person, place, and time.  Psychiatric:        Attention and Perception: Attention normal.        Mood and Affect: Mood normal.        Speech: Speech normal.        Behavior: Behavior is cooperative.        Cognition and Memory: Cognition normal.            Vital Signs: BP 118/68   Pulse (!) 102   Temp 98.2 F (36.8 C) (Oral)   Resp 18   Ht 5\' 10"  (1.778 m)   Wt 85.4 kg   SpO2 96%   BMI 27.01 kg/m  SpO2: SpO2: 96 % O2 Device: O2 Device: Nasal Cannula O2 Flow Rate: O2 Flow Rate (L/min): 3 L/min  Intake/output summary:   Intake/Output Summary (Last 24 hours) at 07/27/2018 1333 Last data filed at 07/26/2018 2130 Gross per 24 hour  Intake -  Output 300 ml  Net -300 ml   LBM: Last BM Date: 07/26/18 Baseline Weight: Weight: 96.8 kg Most recent weight: Weight: 85.4 kg       Palliative Assessment/Data: PPS 40%     Patient Active Problem List   Diagnosis Date Noted  . Goals of care, counseling/discussion   . Pain   . Pain aggravated by physical activity   . Right hip pain   . Sleep disturbance   . Peripheral edema   . Leukocytosis   . Hyponatremia   . Sinus tachycardia   . Hypomagnesemia   . Orthostatic hypotension   . Labile blood pressure   . Cancer associated pain   . DVT of lower extremity (deep venous thrombosis) ([place])   . Malignant neoplasm of lung ([place])   . Hypoalbuminemia due to protein-calorie malnutrition ([place])   . Acute blood loss anemia   . Postoperative pain   . Pathological compression fracture of lumbar vertebra ([place]) 06/29/2018  .  Metastatic lung cancer (metastasis from lung to other site) ([place]) 06/28/2018  . Rash 06/25/2018  . Hypokalemia 06/25/2018  . Normocytic anemia 06/25/2018  . S/P lumbar fusion 06/24/2018  . Palliative care by specialist   . Cancer related pain   . Lung cancer, primary, with metastasis from lung to other site,  right ([place]) 06/14/2018  . Squamous cell lung cancer, right ([place]) 06/14/2018  . Lung cancer metastatic to bone ([place]) 06/14/2018  . Bronchitis due to tobacco use 06/07/2018  . Lumbar spine tumor 06/04/2018    Palliative Care Assessment & Plan   Patient Profile: 73 y.o. male  with past medical history of rheumatoid arthritis admitted on 06/04/2018 with low back pain, radicular pain into right buttocks and leg, and rib/left shoulder pain. CT myelogram ordered outpatient and revealed L4 pathologic compression fracture with lytic destruction and extraosseous tumor encroaching into spinal canal and L4-L5 intervertebral foramina. Seen in outpatient ortho office and decision made for direct admission for further workup. 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 Patient s/p LV corpectomy on 12/20 and L3-L5 spinal fusion on 12/22. Hospitalization and course of rehab complicated by left DVT and surgical site wound infection. Oncology following.   Assessment: Metastatic lung cancer  L4 vertebral body fracture s/p L4 corpectomy and L3-5 fusion Left DVT Cancer related pain Lumbar wound infection s/p I&D and wound vac  Recommendations/Plan:  DNR/DNI in the event of cardiac arrest. Continue current plan of care.   Wife arranged inpatient meeting with attorney on 07/27/2018 to complete living will/POA paperwork.   Continue current symptom management regimen.   After conversation with oncology, wife planning to take patient home with support of hospice services and private caregivers. She wishes to focus on his comfort and symptom management. SW assisting with disposition. [facility] referral has been placed.   Code Status: DNR   Code Status Orders  (From admission, onward)         Start     Ordered   06/29/18 1526  Full code  Continuous     06/29/18 1525        Code Status History    Date Active Date Inactive Code Status Order ID Comments User Context    06/11/2018 2031 06/29/2018 1440 Full Code [id number]  Milagros Loll, MD Inpatient   06/11/2018 2011 06/11/2018 2030 Full Code [id number]  Melina Schools, MD Inpatient   06/04/2018 1344 06/11/2018 2011 Full Code [id number]  Ardeen Jourdain, PA-C Inpatient       Prognosis:   Poor prognosis with metastatic lung cancer, prolonged hospitalization/rehab stay secondary to DVT and wound infection, ongoing cancer related pain.   Discharge Planning:  Home with Hospice   Care plan was discussed with patient, wife, cousin, Algis Liming PA  Thank you for allowing the Palliative Medicine Team to assist in the care of this patient.   Time In: 1230 Time Out: 1340 Total Time 70 Prolonged Time Billed yes      Greater than 50%  of this time was spent counseling and coordinating care related to the above assessment and plan.  Ihor Dow, FNP-C Palliative Medicine Team  Phone: [phone] Fax: [phone]  Please contact Palliative Medicine Team phone at [phone] for questions and concerns.

## 2018-07-27 NOTE — Progress Notes (Signed)
Social Work Patient ID: Brian [name], male   DOB: Nov 04, 1945, 73 y.o.   MRN: [id number] Met with wife who is working on getting private duty caregiver-[employer] to assist with pt's care at home. She and pt want to go home with hospice services and then from there if [facility] is needed they know they have that as an option. Wife is waiting for [facility] to contact her regarding opening the case. Will contact to make sure they got the referral. Pt to have surgery tomorrow to close his wound and remove the vac. Work toward getting pt home with Hospice.

## 2018-07-27 NOTE — Progress Notes (Signed)
Physical Therapy Session Note  Patient Details  Name: Brian [name] MRN: [id number] Date of Birth: [birth year]  Today's Date: 07/27/2018 PT Missed Time: 60 Minutes Missed Time Reason: Patient unwilling to participate;Pain  Short Term Goals: Week 2:  PT Short Term Goal 1 (Week 2): supine> sit with min assist PT Short Term Goal 1 - Progress (Week 2): Progressing toward goal PT Short Term Goal 2 (Week 2): slide board transfer with mod assist consistently PT Short Term Goal 2 - Progress (Week 2): Met PT Short Term Goal 3 (Week 2): pt will be able to describe spinal precautions regarding functional movements PT Short Term Goal 3 - Progress (Week 2): Progressing toward goal  Skilled Therapeutic Interventions/Progress Updates:    Pt missed 60 minutes of therapy this session, pt just received updates on his prognosis and no longer would like to participate in therapy at this time. Pt would like to remain comfortable in his bed spending time with family.     Therapy Documentation Precautions:  Precautions Precautions: Back Precaution Comments: re-educated on BLT, pt cont to be reminded during function Required Braces or Orthoses: Spinal Brace Spinal Brace: Thoracolumbosacral orthotic, Applied in sitting position Restrictions Weight Bearing Restrictions: No    Therapy/Group: Individual Therapy  Netta Corrigan, PT, DPT 07/27/2018, 7:53 AM

## 2018-07-27 NOTE — Progress Notes (Signed)
[name] had his biopsy yesterday.  I had a long talk with he and a niece this morning.  I talked to his wife last night.  I really think that we are in a situation where we need to get hospice involved.  I realize that we got the biopsy yesterday in hopes of trying to a utilize immunotherapy.  However, I think that his performance status (ECOG 3 at best) is not can be good enough for [place] to utilize immunotherapy.  I really believe that his prognosis is such that he will not make it through March unless something drastic happens to him.  I suspect that he likely will have problems with pneumonia because of this growing tumor in the right lung.  I think if he develops postobstructive pneumonia, that probably would be the final event for him.  He is not walking.  He is really not able to care for himself.  He is not functional.  I know that physical therapy and Occupational Therapy have been working hard with him.  I explained what hospice does.  His wife certainly is very agreeable to hospice.  He understands what hospice is.  He would be willing to get hospice involved and allow him to be at home.  I told him that hospice would really be a very valuable resource so that he would be able to stay home.  If, there are problems at home, he would be a candidate for [employer].  He might be a little more comfortable.  Again, he really is not able to do much in the way of activities.  He has a DVT in his left leg.  He has a filter in place.  He is not on any anticoagulation.  I understand this.  I am not sure how much longer he has to stay in the hospital.  I am not sure how much more rehab can do with him.  He understands that this cancer is going to take him.  He wants to have comfort.  Again, with hospice, we can get respect, dignity, and comfort for him.  I will take care of getting hospice involved.  Our goal really is his quality of life.  I will not get results back from the biopsy for  a couple days.  I would then send the biopsy off for molecular profiling to see if there might be a remote chance of treating him with immunotherapy or targeted therapy.  Lattie Haw, MD  Romans 5:3-5

## 2018-07-27 NOTE — Progress Notes (Signed)
Occupational Therapy Session Note  Patient Details  Name: Brian [name] MRN: [id number] Date of Birth: 08/31/1945  Today's Date: 07/27/2018 OT Missed Time: 75 Minutes Missed Time Reason: Patient ill (comment)   Short Term Goals: Week 1:  OT Short Term Goal 1 (Week 1): Pt will complete transfer to toillet/BSC with mod A using LRAD OT Short Term Goal 1 - Progress (Week 1): Not met OT Short Term Goal 2 (Week 1): Pt will thread B LEs into pants with steadying assisting using AE PRN OT Short Term Goal 2 - Progress (Week 1): Met OT Short Term Goal 3 (Week 1): Pt will tolerate standing at sink to complete 1 grooming task in standing with steadying assist OT Short Term Goal 3 - Progress (Week 1): Not met Week 2:  OT Short Term Goal 1 (Week 2): Pt will consistently complete basic sliding board transfers with min A OT Short Term Goal 1 - Progress (Week 2): Not met OT Short Term Goal 2 (Week 2): Pt will complete consecutive toilet transfers as  well as toileting task with +1 assist only in order to reduce caregiver burden OT Short Term Goal 2 - Progress (Week 2): Not met OT Short Term Goal 3 (Week 2): Pt will stand to complete one grooming task in order to increase functional activity tolerance OT Short Term Goal 3 - Progress (Week 2): Not met OT Short Term Goal 4 (Week 2): Pt will consistently complete LB dressing task with +1 assist without use of lift equipment in prep for d/c  OT Short Term Goal 4 - Progress (Week 2): Not met Week 3:  OT Short Term Goal 1 (Week 3): Pt will consistently complete sliding board transfer with min A in order to reduce caregiver burden OT Short Term Goal 1 - Progress (Week 3): Met OT Short Term Goal 2 (Week 3): Pt will tolerate completing UB bathing/dressing from EOB with supervision/set-up OT Short Term Goal 2 - Progress (Week 3): Met OT Short Term Goal 3 (Week 3): Pt will complete bed mobility at supervision level during bed level ADLs in order to reduce  caregiver burden OT Short Term Goal 3 - Progress (Week 3): Not met OT Short Term Goal 4 (Week 3): Pt will tolerate sitting OOB for>2 hours in order to increase functional activity tolerance. OT Short Term Goal 4 - Progress (Week 3): Met  Skilled Therapeutic Interventions/Progress Updates:    Pt received in bed with family in the room. Pt was not feeling well enough to participate so suggested to his wife that she could learn some techniques to assist pt with bed mobility as she may not have 24/7 A from hospice.  She stated she would like to do that but not until Thursday as they are "dealing with a lot" right now.    Informed his primary rehab team of her request.  Therapy Documentation Precautions:  Precautions Precautions: Back Precaution Comments: re-educated on BLT, pt cont to be reminded during function Required Braces or Orthoses: Spinal Brace Spinal Brace: Thoracolumbosacral orthotic, Applied in sitting position Restrictions Weight Bearing Restrictions: No General: General OT Amount of Missed Time: 75 Minutes due to pain, illness    SAGUIER,JULIA 07/27/2018, 10:58 AM

## 2018-07-27 NOTE — Progress Notes (Signed)
Occupational Therapy Note  Patient Details  Name: Brian [name] MRN: [id number] Date of Birth: 05-Sep-1945  Pt's plan of care adjusted to Q.D. after speaking with care team and discussed with MD in team conference as pt currently unable to tolerate current therapy schedule with OT and PT.   Stefan Karen L 07/27/2018, 11:52 AM

## 2018-07-28 ENCOUNTER — Inpatient Hospital Stay (HOSPITAL_COMMUNITY): Payer: Medicare Other

## 2018-07-28 ENCOUNTER — Inpatient Hospital Stay (HOSPITAL_COMMUNITY): Payer: Medicare Other | Admitting: Anesthesiology

## 2018-07-28 ENCOUNTER — Inpatient Hospital Stay (HOSPITAL_COMMUNITY): Admission: RE | Admit: 2018-07-28 | Payer: Medicare Other | Source: Home / Self Care | Admitting: Orthopedic Surgery

## 2018-07-28 ENCOUNTER — Encounter (HOSPITAL_COMMUNITY): Payer: Self-pay | Admitting: Orthopedic Surgery

## 2018-07-28 ENCOUNTER — Encounter (HOSPITAL_COMMUNITY)
Admission: RE | Disposition: A | Discharge status: Hospice | Payer: Self-pay | Source: Intra-hospital | Attending: Physical Medicine & Rehabilitation

## 2018-07-28 ENCOUNTER — Inpatient Hospital Stay (HOSPITAL_COMMUNITY): Payer: Medicare Other | Admitting: Occupational Therapy

## 2018-07-28 DIAGNOSIS — C7801 Secondary malignant neoplasm of right lung: Secondary | ICD-10-CM

## 2018-07-28 HISTORY — PX: LUMBAR WOUND DEBRIDEMENT: SHX1988

## 2018-07-28 LAB — BASIC METABOLIC PANEL
Anion gap: 9 (ref 5–15)
BUN: 12 mg/dL (ref 8–23)
CHLORIDE: 106 mmol/L (ref 98–111)
CO2: 27 mmol/L (ref 22–32)
Calcium: 7.4 mg/dL — ABNORMAL LOW (ref 8.9–10.3)
Creatinine, Ser: 0.62 mg/dL (ref 0.61–1.24)
GFR calc Af Amer: >60 mL/min (ref >60)
GFR calc non Af Amer: >60 mL/min (ref >60)
Glucose, Bld: 117 mg/dL — ABNORMAL HIGH (ref 70–99)
Potassium: 3.3 mmol/L — ABNORMAL LOW (ref 3.5–5.1)
Sodium: 142 mmol/L (ref 135–145)

## 2018-07-28 LAB — CBC WITH DIFFERENTIAL/PLATELET
Abs Immature Granulocytes: 0.29 10*3/uL — ABNORMAL HIGH (ref 0.00–0.07)
Basophils Absolute: 0.1 10*3/uL (ref 0.0–0.1)
Basophils Relative: 0 %
EOS ABS: 0.1 10*3/uL (ref 0.0–0.5)
Eosinophils Relative: 0 %
HEMATOCRIT: 35 % — AB (ref 39.0–52.0)
Hemoglobin: 9.9 g/dL — ABNORMAL LOW (ref 13.0–17.0)
Immature Granulocytes: 1 %
Lymphocytes Relative: 11 %
Lymphs Abs: 2.3 10*3/uL (ref 0.7–4.0)
MCH: 25.8 pg — ABNORMAL LOW (ref 26.0–34.0)
MCHC: 28.3 g/dL — ABNORMAL LOW (ref 30.0–36.0)
MCV: 91.4 fL (ref 80.0–100.0)
Monocytes Absolute: 1.3 10*3/uL — ABNORMAL HIGH (ref 0.1–1.0)
Monocytes Relative: 6 %
Neutro Abs: 17.5 10*3/uL — ABNORMAL HIGH (ref 1.7–7.7)
Neutrophils Relative %: 82 %
Platelets: 436 10*3/uL — ABNORMAL HIGH (ref 150–400)
RBC: 3.83 MIL/uL — ABNORMAL LOW (ref 4.22–5.81)
RDW: 15.9 % — AB (ref 11.5–15.5)
WBC: 21.5 10*3/uL — ABNORMAL HIGH (ref 4.0–10.5)
nRBC: 0 % (ref 0.0–0.2)

## 2018-07-28 LAB — MAGNESIUM: Magnesium: 2.4 mg/dL (ref 1.7–2.4)

## 2018-07-28 SURGERY — LUMBAR WOUND DEBRIDEMENT
Anesthesia: General

## 2018-07-28 MED ORDER — GLYCOPYRROLATE PF 0.2 MG/ML IJ SOSY
PREFILLED_SYRINGE | INTRAMUSCULAR | Status: AC
  Filled 2018-07-28: qty 1

## 2018-07-28 MED ORDER — DOUBLE ANTIBIOTIC 500-10000 UNIT/GM EX OINT
TOPICAL_OINTMENT | CUTANEOUS | Status: AC
  Filled 2018-07-28: qty 1

## 2018-07-28 MED ORDER — LIDOCAINE 2% (20 MG/ML) 5 ML SYRINGE
INTRAMUSCULAR | Status: AC
  Filled 2018-07-28: qty 5

## 2018-07-28 MED ORDER — BACITRACIN ZINC 500 UNIT/GM EX OINT
TOPICAL_OINTMENT | CUTANEOUS | Status: DC | PRN
  Administered 2018-07-28: 1 via TOPICAL

## 2018-07-28 MED ORDER — LIDOCAINE 2% (20 MG/ML) 5 ML SYRINGE
INTRAMUSCULAR | Status: DC | PRN
  Administered 2018-07-28: 80 mg via INTRAVENOUS

## 2018-07-28 MED ORDER — ONDANSETRON HCL 4 MG/2ML IJ SOLN
INTRAMUSCULAR | Status: AC
  Filled 2018-07-28: qty 2

## 2018-07-28 MED ORDER — 0.9 % SODIUM CHLORIDE (POUR BTL) OPTIME
TOPICAL | Status: DC | PRN
  Administered 2018-07-28: 1000 mL

## 2018-07-28 MED ORDER — ACETAMINOPHEN 10 MG/ML IV SOLN: 1000.0000 mg | Freq: Once | INTRAVENOUS | Status: DC | PRN

## 2018-07-28 MED ORDER — HYDROMORPHONE HCL 1 MG/ML IJ SOLN
0.2500 mg | INTRAMUSCULAR | Status: DC | PRN
  Administered 2018-07-28 (×2): 0.5 mg via INTRAVENOUS

## 2018-07-28 MED ORDER — VANCOMYCIN HCL 1000 MG IV SOLR
INTRAVENOUS | Status: AC
  Filled 2018-07-28: qty 1000

## 2018-07-28 MED ORDER — FENTANYL CITRATE (PF) 250 MCG/5ML IJ SOLN
INTRAMUSCULAR | Status: AC
  Filled 2018-07-28: qty 5

## 2018-07-28 MED ORDER — ROCURONIUM BROMIDE 50 MG/5ML IV SOSY
PREFILLED_SYRINGE | INTRAVENOUS | Status: AC
  Filled 2018-07-28: qty 5

## 2018-07-28 MED ORDER — ROCURONIUM BROMIDE 50 MG/5ML IV SOSY
PREFILLED_SYRINGE | INTRAVENOUS | Status: DC | PRN
  Administered 2018-07-28: 50 mg via INTRAVENOUS

## 2018-07-28 MED ORDER — PHENYLEPHRINE 40 MCG/ML (10ML) SYRINGE FOR IV PUSH (FOR BLOOD PRESSURE SUPPORT)
PREFILLED_SYRINGE | INTRAVENOUS | Status: DC | PRN
  Administered 2018-07-28 (×2): 80 ug via INTRAVENOUS

## 2018-07-28 MED ORDER — BUPIVACAINE-EPINEPHRINE 0.5% -1:200000 IJ SOLN
INTRAMUSCULAR | Status: AC
  Filled 2018-07-28: qty 1

## 2018-07-28 MED ORDER — SODIUM CHLORIDE 0.9 % IR SOLN
Status: DC | PRN
  Administered 2018-07-28: 3000 mL

## 2018-07-28 MED ORDER — PHENYLEPHRINE 40 MCG/ML (10ML) SYRINGE FOR IV PUSH (FOR BLOOD PRESSURE SUPPORT)
PREFILLED_SYRINGE | INTRAVENOUS | Status: AC
  Filled 2018-07-28: qty 10

## 2018-07-28 MED ORDER — PROPOFOL 10 MG/ML IV BOLUS
INTRAVENOUS | Status: AC
  Filled 2018-07-28: qty 20

## 2018-07-28 MED ORDER — VANCOMYCIN HCL 1000 MG IV SOLR
INTRAVENOUS | Status: DC | PRN
  Administered 2018-07-28: 1000 mg via TOPICAL

## 2018-07-28 MED ORDER — HYDROMORPHONE HCL 1 MG/ML IJ SOLN
INTRAMUSCULAR | Status: AC
  Administered 2018-07-28: 0.5 mg via INTRAVENOUS
  Filled 2018-07-28: qty 1

## 2018-07-28 MED ORDER — FENTANYL CITRATE (PF) 100 MCG/2ML IJ SOLN
INTRAMUSCULAR | Status: DC | PRN
  Administered 2018-07-28 (×2): 50 ug via INTRAVENOUS

## 2018-07-28 MED ORDER — SUGAMMADEX SODIUM 200 MG/2ML IV SOLN
INTRAVENOUS | Status: DC | PRN
  Administered 2018-07-28: 200 mg via INTRAVENOUS

## 2018-07-28 MED ORDER — HYDROMORPHONE HCL 1 MG/ML IJ SOLN
INTRAMUSCULAR | Status: AC
  Filled 2018-07-28: qty 0.5

## 2018-07-28 MED ORDER — LACTATED RINGERS IV SOLN
INTRAVENOUS | Status: DC | PRN
  Administered 2018-07-28: 09:00:00 via INTRAVENOUS

## 2018-07-28 MED ORDER — PROMETHAZINE HCL 25 MG/ML IJ SOLN: 6.2500 mg | INTRAMUSCULAR | Status: DC | PRN

## 2018-07-28 MED ORDER — ONDANSETRON HCL 4 MG/2ML IJ SOLN
INTRAMUSCULAR | Status: DC | PRN
  Administered 2018-07-28: 4 mg via INTRAVENOUS

## 2018-07-28 MED ORDER — PROPOFOL 10 MG/ML IV BOLUS
INTRAVENOUS | Status: DC | PRN
  Administered 2018-07-28: 140 mg via INTRAVENOUS

## 2018-07-28 MED ORDER — LACTATED RINGERS IV SOLN
INTRAVENOUS | Status: DC
  Administered 2018-07-28: 09:00:00 via INTRAVENOUS

## 2018-07-28 MED ORDER — DEXAMETHASONE SODIUM PHOSPHATE 10 MG/ML IJ SOLN
INTRAMUSCULAR | Status: AC
  Filled 2018-07-28: qty 1

## 2018-07-28 MED ORDER — HYDROMORPHONE HCL 1 MG/ML IJ SOLN
INTRAMUSCULAR | Status: DC | PRN
  Administered 2018-07-28: 0.5 mg via INTRAVENOUS

## 2018-07-28 SURGICAL SUPPLY — 61 items
BUR EGG ELITE 4.0 (BURR) IMPLANT
BUR EGG ELITE 4.0MM (BURR)
CANISTER SUCT 3000ML PPV (MISCELLANEOUS) ×3 IMPLANT
CLOSURE WOUND 1/2 X4 (GAUZE/BANDAGES/DRESSINGS) ×1
CORDS BIPOLAR (ELECTRODE) ×3 IMPLANT
COVER SURGICAL LIGHT HANDLE (MISCELLANEOUS) ×3 IMPLANT
COVER WAND RF STERILE (DRAPES) ×3 IMPLANT
DRAPE HALF SHEET 40X57 (DRAPES) ×3 IMPLANT
DRAPE POUCH INSTRU U-SHP 10X18 (DRAPES) ×3 IMPLANT
DRAPE SURG 17X23 STRL (DRAPES) ×3 IMPLANT
DRAPE U-SHAPE 47X51 STRL (DRAPES) ×3 IMPLANT
DRSG AQUACEL AG ADV 3.5X10 (GAUZE/BANDAGES/DRESSINGS) ×3 IMPLANT
DRSG OPSITE POSTOP 4X10 (GAUZE/BANDAGES/DRESSINGS) ×3 IMPLANT
DURAPREP 26ML APPLICATOR (WOUND CARE) ×3 IMPLANT
ELECT BLADE 4.0 EZ CLEAN MEGAD (MISCELLANEOUS)
ELECT CAUTERY BLADE 6.4 (BLADE) ×3 IMPLANT
ELECT PENCIL ROCKER SW 15FT (MISCELLANEOUS) ×3 IMPLANT
ELECT REM PT RETURN 9FT ADLT (ELECTROSURGICAL) ×3
ELECTRODE BLDE 4.0 EZ CLN MEGD (MISCELLANEOUS) IMPLANT
ELECTRODE REM PT RTRN 9FT ADLT (ELECTROSURGICAL) ×1 IMPLANT
EVACUATOR 1/8 PVC DRAIN (DRAIN) IMPLANT
GLOVE BIO SURGEON STRL SZ 6.5 (GLOVE) ×2 IMPLANT
GLOVE BIO SURGEONS STRL SZ 6.5 (GLOVE) ×1
GLOVE BIOGEL PI IND STRL 6.5 (GLOVE) ×1 IMPLANT
GLOVE BIOGEL PI IND STRL 8.5 (GLOVE) ×1 IMPLANT
GLOVE BIOGEL PI INDICATOR 6.5 (GLOVE) ×2
GLOVE BIOGEL PI INDICATOR 8.5 (GLOVE) ×2
GLOVE SS BIOGEL STRL SZ 8.5 (GLOVE) ×1 IMPLANT
GLOVE SUPERSENSE BIOGEL SZ 8.5 (GLOVE) ×2
GOWN STRL REUS W/ TWL LRG LVL3 (GOWN DISPOSABLE) ×1 IMPLANT
GOWN STRL REUS W/TWL 2XL LVL3 (GOWN DISPOSABLE) ×6 IMPLANT
GOWN STRL REUS W/TWL LRG LVL3 (GOWN DISPOSABLE) ×2
KIT BASIN OR (CUSTOM PROCEDURE TRAY) ×3 IMPLANT
KIT TURNOVER KIT B (KITS) ×3 IMPLANT
NEEDLE 22X1 1/2 (OR ONLY) (NEEDLE) ×3 IMPLANT
NEEDLE SPNL 18GX3.5 QUINCKE PK (NEEDLE) ×6 IMPLANT
NS IRRIG 1000ML POUR BTL (IV SOLUTION) ×3 IMPLANT
PACK LAMINECTOMY ORTHO (CUSTOM PROCEDURE TRAY) ×3 IMPLANT
PACK UNIVERSAL I (CUSTOM PROCEDURE TRAY) ×3 IMPLANT
PAD ARMBOARD 7.5X6 YLW CONV (MISCELLANEOUS) ×6 IMPLANT
PATTIES SURGICAL .5 X.5 (GAUZE/BANDAGES/DRESSINGS) IMPLANT
PATTIES SURGICAL .5 X1 (DISPOSABLE) IMPLANT
SPONGE SURGIFOAM ABS GEL 100 (HEMOSTASIS) IMPLANT
STRIP CLOSURE SKIN 1/2X4 (GAUZE/BANDAGES/DRESSINGS) ×2 IMPLANT
SURGIFLO W/THROMBIN 8M KIT (HEMOSTASIS) IMPLANT
SUT BONE WAX W31G (SUTURE) ×3 IMPLANT
SUT MON AB 3-0 SH 27 (SUTURE) ×2
SUT MON AB 3-0 SH27 (SUTURE) ×1 IMPLANT
SUT PROLENE 1 CT (SUTURE) ×3 IMPLANT
SUT PROLENE 2 0 CT 1 (SUTURE) ×3 IMPLANT
SUT VIC AB 0 CT1 27 (SUTURE) ×2
SUT VIC AB 0 CT1 27XBRD ANBCTR (SUTURE) ×1 IMPLANT
SUT VIC AB 1 CTX 36 (SUTURE) ×4
SUT VIC AB 1 CTX36XBRD ANBCTR (SUTURE) ×2 IMPLANT
SUT VIC AB 2-0 CT1 18 (SUTURE) ×3 IMPLANT
SYR BULB IRRIGATION 50ML (SYRINGE) ×3 IMPLANT
SYR CONTROL 10ML LL (SYRINGE) ×3 IMPLANT
TOWEL OR 17X24 6PK STRL BLUE (TOWEL DISPOSABLE) ×3 IMPLANT
TOWEL OR 17X26 10 PK STRL BLUE (TOWEL DISPOSABLE) ×3 IMPLANT
WATER STERILE IRR 1000ML POUR (IV SOLUTION) ×3 IMPLANT
YANKAUER SUCT BULB TIP NO VENT (SUCTIONS) ×3 IMPLANT

## 2018-07-28 NOTE — Progress Notes (Addendum)
[place] PHYSICAL MEDICINE & REHABILITATION PROGRESS NOTE  Subjective/Complaints: Patient seen sitting up in bed this morning.  He states he slept fairly overnight.  Remains flat.  He does note plan for wound closure today.  ROS: Denies CP, shortness of breath, nausea, vomiting, diarrhea.  Objective: Vital Signs: Blood pressure 115/74, pulse 100, temperature 97.7 F (36.5 C), temperature source Oral, resp. rate 16, height 5' 10" (1.778 m), weight 85.4 kg, SpO2 97 %. Ct Biopsy  Result Date: 07/26/2018 INDICATION: History of metastatic lung cancer, now with infiltrative lytic mass involving the lateral aspect of the right eighth rib. Please perform image guided biopsy to obtain additional tissue for molecular testing. EXAM: CT AND ULTRASOUND-GUIDED BIOPSY OF LYTIC MASS INVOLVING THE LATERAL ASPECT THE RIGHT EIGHTH RIB COMPARISON:  Chest CT-07/22/2018 MEDICATIONS: None ANESTHESIA/SEDATION: Moderate (conscious) sedation was employed during this procedure. A total of Versed 1 mg and Fentanyl 25 mcg was administered intravenously. Moderate Sedation Time: 14 minutes. The patient's level of consciousness and vital signs were monitored continuously by radiology nursing throughout the procedure under my direct supervision. COMPLICATIONS: None immediate. TECHNIQUE: Informed written consent was obtained from the patient after a discussion of the risks, benefits and alternatives to treatment. Questions regarding the procedure were encouraged and answered. Patient was positioned left lateral decubitus on the CT gantry with CT imaging demonstrating unchanged size and appearance of the approximately 7.5 x 3.2 cm lytic mass destroying the lateral aspect of the right eighth rib (image 70, series 2). The skin site was marked. Ultrasound scanning was able to adequately visualize the lytic lesion The skin overlying the lesion was prepped and draped in usual sterile fashion. Under direct ultrasound guidance, an 18 gauge  coaxial needle was advanced into the lytic lesion. Ultrasound image was saved for procedural documentation purposes. Appropriate positioning was confirmed with CT imaging. Next, under direct ultrasound guidance, 6 core needle biopsies was obtained of the lytic lesion. The coaxial needle was removed and postprocedural imaging was obtained was negative for hematoma or pneumothorax. A dressing was placed. The patient tolerated the procedure well without immediate postprocedural complication. IMPRESSION: Technically successful ultrasound and CT-guided biopsy of infiltrative lytic lesion involving the lateral aspect of the right eighth rib. Electronically Signed   By: Sandi Mariscal M.D.   On: 07/26/2018 16:20   Recent Labs    07/26/18 1214 07/28/18 0525  WBC 17.0* 21.5*  HGB 8.9* 9.9*  HCT 30.9* 35.0*  PLT 349 436*   Recent Labs    07/26/18 1214 07/28/18 0525  NA 142 142  K 3.5 3.3*  CL 104 106  CO2 26 27  GLUCOSE 111* 117*  BUN 13 12  CREATININE 0.57* 0.62  CALCIUM 7.2* 7.4*    Physical Exam: BP 115/74   Pulse 100   Temp 97.7 F (36.5 C) (Oral)   Resp 16   Ht 5' 10" (1.778 m)   Wt 85.4 kg   SpO2 97%   BMI 27.01 kg/m  Constitutional: No distress . Vital signs reviewed. HENT: Normocephalic.  Atraumatic. Eyes: EOMI. No discharge. Cardiovascular: RRR. No JVD. Respiratory: CTA bilaterally.  Normal effort.  +[place] GI: BS +. Non-distended. Musculoskeletal: No edema or tenderness in extremities Neurological: He is alert and oriented  Dysphonia Able to follow basic commands without difficulty.  Motor:  Right lower extremity: 4/5 proximal distal, pain inhibition, stable Left lower extremity: Hip flexion 4-/5, knee extension 4/5, ankle dorsiflexion 4/5, some pain inhibition, stable Skin: Warm and dry. Intact. +VAC. Psychiatric: Flat  Assessment/Plan: 1. Functional deficits secondary to lung cancer with mets to ribs, vertebrae with oncological fracture status post lumbar fusion which  require 3+ hours per day of interdisciplinary therapy in a comprehensive inpatient rehab setting.  Physiatrist is providing close team supervision and 24 hour management of active medical problems listed below.  Physiatrist and rehab team continue to assess barriers to discharge/monitor patient progress toward functional and medical goals  Care Tool:  Bathing  Bathing activity did not occur: Safety/medical concerns(Unable to assess 2/2 drop in BP with upright positioning) Body parts bathed by patient: Right arm, Left arm, Chest, Abdomen, Front perineal area, Right upper leg, Left upper leg, Face   Body parts bathed by helper: Right lower leg, Left lower leg, Buttocks     Bathing assist Assist Level: Moderate Assistance - Patient 50 - 74%     Upper Body Dressing/Undressing Upper body dressing Upper body dressing/undressing activity did not occur (including orthotics): Safety/medical concerns(B/P issues ) What is the patient wearing?: Orthosis Orthosis activity level: Performed by helper  Upper body assist Assist Level: Total Assistance - Patient < 25%    Lower Body Dressing/Undressing Lower body dressing      What is the patient wearing?: Pants, Incontinence brief     Lower body assist Assist for lower body dressing: 2 Helpers     Toileting Toileting Toileting Activity did not occur (Clothing management and hygiene only): Safety/medical concerns(Unable to assess 2/2 drop in BP with upright positioning)  Toileting assist Assist for toileting: 2 Helpers     Transfers Chair/bed transfer  Transfers assist     Chair/bed transfer assist level: Moderate Assistance - Patient 50 - 74%     Locomotion Ambulation   Ambulation assist   Ambulation activity did not occur: Safety/medical concerns  Assist level: Minimal Assistance - Patient > 75% Assistive device: Walker-rolling Max distance: 23'   Walk 10 feet activity   Assist     Assist level: Moderate Assistance -  Patient - 50 - 74% Assistive device: Walker-rolling   Walk 50 feet activity   Assist           Walk 150 feet activity   Assist           Walk 10 feet on uneven surface  activity   Assist           Wheelchair     Assist Will patient use wheelchair at discharge?: Yes Type of Wheelchair: Manual Wheelchair activity did not occur: Safety/medical concerns(hypotensive)  Wheelchair assist level: Supervision/Verbal cueing Max wheelchair distance: 150    Wheelchair 50 feet with 2 turns activity    Assist    Wheelchair 50 feet with 2 turns activity did not occur: Safety/medical concerns   Assist Level: Supervision/Verbal cueing   Wheelchair 150 feet activity     Assist Wheelchair 150 feet activity did not occur: Safety/medical concerns   Assist Level: Supervision/Verbal cueing      Medical Problem List and Plan: 1.Functional and mobility deficitssecondary to metastatic lung cancer to left clavicle, ribs, L4 vertebral body with fracture. Pt is s/p L4 corpectomy and L3-5 fusion by Dr. Rolena Infante on 06/11/18  Continue CIR  Per hematology/oncology, new chest wall as well as lung mass on CT chest from 1/30.  Rad-onc involved. CHest wall biopsied, results pending. 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 DVT: dopplers done this  evening and patient has DVT in left CFV             IVC filter placement on 1/8  Pt not a candidate for anticoagulation due to hematoma and bleeding riskat present although long term he is high VTE risk 3. Pain Management: dilaudid prn. Unable to tolerate Oxycodone   Fentanyl increased to 25 mcg on 1/17, increased to 50 on 1/21- cont current dose  Appreciate palliative care consult- notes reviewed  Cymbalta added on 1/17, increased on 1/24  Right coracoid met- no biceps curls  Voltaren gel  added on 1/23 with improvement  Relatively controlled on 1/28 Hydrocodone for moderate and dilaudid for severe breakthough pain 4. Mood:LCSW to follow for evaluation and support.  Cymbalta added  Patient more calm when daughter not present 5. Neuropsych: This patientiscapable of making decisions on hisown behalf. 6. Skin/Wound Care:  VAC in place on back wound per ortho, plan is to OR for closure today 7. Fluids/Electrolytes/Nutrition:Monitor I/O.  8.Blood pressure:  Abdominal binder as needed  Relatively controlled on 2/5, orthostatics negative  Monitor with increased mobility 9. ABLA: Continue to monitor with serial checks.   Hemoglobin 9.9 on 2/5  Iron studies consistent with anemia of chronic disease: Ferritin levels high  IV iron 10. Hypokalemia:   Potassium 3.3 on 2/5   Supplement increased on 1/13, however patient refusing  Magnesium 2.2 on 1/24   Supplement initiated on 1/14 11. Rash: Continue Clobetasol bidas well as hypoallergenic sheets. 12. Neuropathic pain: Improving on gabapentin 300 mg tid 13. Constipation:Managed with miralax prn--patient wants to manage his bowel program. 14.  Hypoalbuminemia  Supplement initiated on 1/8 15.  Sinus tachycardia  Metoprolol 12.5 twice daily started on 1/13, changed to Coreg on 1/15  Slightly elevated on 2/5, however patient refusing medications 16.  Hyponatremia: Resolved  Continue to monitor 17.  Wound infection  WBCs 21.5 on 2/5  Wound cultures with E. coli, pansensitive  Chest x-ray reviewed with patient, unremarkable for acute infectious process, repeat chest x-ray on 1/16 reviewed showing some lower abnormality-continue to monitor  Afebrile  Appreciate ID recs, notes reviewed- IV ceftezole   Antibiotic beads placed by Ortho in wound  Blood cultures no growth  ESR/CRP elevated on 1/22  Back incision as above 18. Peripheral edema  Lasix 20 daily started on 1/15, increased to twice daily on 1/21 local care and  elevation   Improving 19.  Sleep disturbance  Trazodone DC'd, Benadryl ordered for pruritus and sleep on 1/17  Improving. 21.  Multiple joint pain-metastases to left proximal clavicle, right coracoid process  Right shoulder due to coracoid met- some mild tenderness, no biceps curls on right side 22.  Palliative care- pt DNR,   See palliative care discussions  Family has discussed with oncology, await further plans for?  Hospice  LOS: 29 days A FACE TO FACE EVALUATION WAS PERFORMED   [name] 07/28/2018, 8:57 AM

## 2018-07-28 NOTE — Anesthesia Postprocedure Evaluation (Signed)
Anesthesia Post Note  Patient: Brian [name]  Procedure(s) Performed: LUMBAR WOUND DEBRIDEMENT (N/A )     Patient location during evaluation: PACU Anesthesia Type: General Level of consciousness: awake and alert Pain management: pain level controlled Vital Signs Assessment: post-procedure vital signs reviewed and stable Respiratory status: spontaneous breathing, nonlabored ventilation and respiratory function stable Cardiovascular status: blood pressure returned to baseline and stable Postop Assessment: no apparent nausea or vomiting Anesthetic complications: no    Last Vitals:  Vitals:   07/28/18 1145 07/28/18 1148  BP: 106/77 101/70  Pulse: 90 88  Resp: 12 15  Temp:  (!) 36.3 C  SpO2: 98% 97%    Last Pain:  Vitals:   07/28/18 1145  TempSrc:   PainSc: [place]

## 2018-07-28 NOTE — Progress Notes (Addendum)
[facility]  Referral for hospice services at home received by [facility].    Met with wife [name], explained services, answered questions and provided emotional support.  [name] voices how trying the last few months have been.  She states her husband is withdrawn and angry and she request that we speak with her only at this time.    DME needs discussed, they currently would need:  O2, transfer board, bed with rails, bedside table, suction, shower chair, 3 in 1, and air overlay mattress.  [facility] will place order with [facility].    Pt currently on rocephin 2g IV QD, scheduled to complete 2/17.  This is not on the formulary of what [facility] can cover.  Please see if he can transition to oral antibiotics, or the family would have to pay out of pocket for this at home.   [facility] will continue to follow and anticipate discharge.  Left msg for LCSW.  Updated PMT.   **500pm addendum, pt has been approved for hospice services at home after discharge and abx transitioned to oral.  Venia Carbon BSN, RN [facility] Liaison (listed in [place]) [phone]

## 2018-07-28 NOTE — Anesthesia Procedure Notes (Signed)
Procedure Name: Intubation Date/Time: 07/28/2018 9:52 AM Performed by: Orlie Dakin, CRNA Pre-anesthesia Checklist: Emergency Drugs available, Suction available, Patient identified and Patient being monitored Patient Re-evaluated:Patient Re-evaluated prior to induction Oxygen Delivery Method: Circle system utilized Preoxygenation: Pre-oxygenation with 100% oxygen Induction Type: IV induction Ventilation: Mask ventilation without difficulty Laryngoscope Size: Miller and 3 Grade View: Grade I Tube type: Oral Tube size: 7.5 mm Number of attempts: 1 Airway Equipment and Method: Stylet Placement Confirmation: ETT inserted through vocal cords under direct vision,  positive ETCO2 and breath sounds checked- equal and bilateral Secured at: 23 cm Tube secured with: Tape Dental Injury: Teeth and Oropharynx as per pre-operative assessment  Comments: 4x4s bite block used.

## 2018-07-28 NOTE — Progress Notes (Signed)
Spoke with [facility] to make sure referral was made for pt. She will follow up with hospital liaison and make sure they touch base with pt and wife today. Will need to get DME delivered prior to pt going home Friday.

## 2018-07-28 NOTE — Progress Notes (Signed)
Physical Therapy Session Note  Patient Details  Name: Brian [name] MRN: [id number] Date of Birth: Nov 25, 1945  Today's Date: 07/28/2018 PT Individual Time: 1430-1530 PT Individual Time Calculation (min): 60 min   Short Term Goals:  Week 4:  PT Short Term Goal 1 (Week 4): = LTGs due to ELOS      Skilled Therapeutic Interventions/Progress Updates:  Pt in bed, eating and drinking with HOB raised. Pt's cousin Stanton Kidney here for family ed, and his wife observing.  Mary donned pt's abdominal binder in bed, TLSO in sitting, transferred pt from bed>< w/c, level or slightly downhill, using slide board. Stanton Kidney would benefit from further ed for bed mobility and slide board transfer.   PT donned TEDS.  Pt rolled L><R with min assist and use of bed features so that [facility] could don abdominal binder.  Using bed features, pt sat up with mod assist.  In sitting, pt stated that he had a "tiny" bit of dizziness; this resolved in a couple of minutes.  Slide board transfer from R side to bed, to L into w/c, mod assist.  Pt and Stanton Kidney needed multiple cues for head/hips relationship, and Mary's position in relation to pt's.  After pt moved into w/c, he stated that he had had a bowel accident as Stanton Kidney tried to remove board.  Slide board transfer to return to bed.  In R side lying, Stanton Kidney was able to remove pt's shorts with pt assisting in elevating hips slightly.  Deidre Ala, RN aware, and entering room to provide hygiene change.  Pt's mood much better than the last time this therapist saw him, last week.   D/C planned to home with Hospice, 2/7.      Therapy Documentation Precautions:  Precautions Precautions: Back Precaution Comments: re-educated on BLT, pt cont to be reminded during function Required Braces or Orthoses: Spinal Brace Spinal Brace: Thoracolumbosacral orthotic, Applied in sitting position Restrictions Weight Bearing Restrictions: No Pain: Pain Assessment Pain Scale: 0-10 Pain Score:  4 Surgical/back premedicated    Therapy/Group: Individual Therapy  Chai Routh 07/28/2018, 4:29 PM

## 2018-07-28 NOTE — Progress Notes (Signed)
    Subjective: Procedure(s) (LRB): LUMBAR WOUND DEBRIDEMENT (N/A) Day of Surgery  Patient reports pain as 4 on 0-10 scale.  Reports decreased leg pain reports incisional back pain   Positive void Positive bowel movement Positive flatus Negative chest pain or shortness of breath  Objective: Vital signs in last 24 hours: Temp:  [97.7 F (36.5 C)-98 F (36.7 C)] 97.7 F (36.5 C) (02/05 0516) Pulse Rate:  [90-102] 100 (02/05 0833) Resp:  [15-16] 16 (02/05 0516) BP: (115-123)/(68-74) 115/74 (02/05 0833) SpO2:  [88 %-97 %] 97 % (02/05 0516) Weight:  [85.4 kg] 85.4 kg (02/05 0916)  Intake/Output from previous day: 02/04 0701 - 02/05 0700 In: 250 [P.O.:240; I.V.:10] Out: 275 [Urine:175; Drains:100]  Labs: Recent Labs    07/26/18 1214 07/28/18 0525  WBC 17.0* 21.5*  RBC 3.33* 3.83*  HCT 30.9* 35.0*  PLT 349 436*   Recent Labs    07/26/18 1214 07/28/18 0525  NA 142 142  K 3.5 3.3*  CL 104 106  CO2 26 27  BUN 13 12  CREATININE 0.57* 0.62  GLUCOSE 111* 117*  CALCIUM 7.2* 7.4*   Recent Labs    07/26/18 1214  INR 1.21    Physical Exam: Intact pulses distally Incision: scant drainage Compartment soft Body mass index is 27.01 kg/m.   Assessment/Plan: Patient stable  Patient presents for primary wound closure.  He is remained afebrile, and the wound at last direct evaluation healing well and showed no signs of persistent infection.   Surgical plan: Repeat washout placement of vancomycin powder and then primary closure.  Discussed this with the patient and his wife and reviewed all appropriate risks benefits.  All of their questions were encouraged and addressed.  Risks include: Recurrent infection, need for additional surgery, wound healing complications, ongoing or worse pain,, stroke, paralysis.  Melina Schools, MD [facility] [phone]

## 2018-07-28 NOTE — Transfer of Care (Signed)
Immediate Anesthesia Transfer of Care Note  Patient: Brian [name]  Procedure(s) Performed: LUMBAR WOUND DEBRIDEMENT (N/A )  Patient Location: PACU  Anesthesia Type:General  Level of Consciousness: awake, oriented and patient cooperative  Airway & Oxygen Therapy: Patient Spontanous Breathing and Patient connected to nasal cannula oxygen  Post-op Assessment: Report given to RN and Post -op Vital signs reviewed and stable  Post vital signs: Reviewed and stable  Last Vitals:  Vitals Value Taken Time  BP 103/62 07/28/2018 11:03 AM  Temp    Pulse 92 07/28/2018 11:07 AM  Resp 17 07/28/2018 11:07 AM  SpO2 95 % 07/28/2018 11:07 AM  Vitals shown include unvalidated device data.  Last Pain:  Vitals:   07/28/18 0604  TempSrc:   PainSc: 7       Patients Stated Pain Goal: 3 (78/97/84 7841)  Complications: No apparent anesthesia complications

## 2018-07-28 NOTE — Patient Care Conference (Signed)
Inpatient RehabilitationTeam Conference and Plan of Care Update Date: 07/28/2018   Time: 2:20 PM    Patient Name: Brian [name]      Medical Record Number: [id number]  Date of Birth: [birth year] Sex: Male         Room/Bed: 4M10C/4M10C-01 Payor Info: Payor: MEDICARE / Plan: MEDICARE PART A AND B / Product Type: *No Product type* /    Admitting Diagnosis: metastatic lung cancer  Admit Date/Time:  06/29/2018  2:39 PM Admission Comments: No comment available   Primary Diagnosis:  <principal problem not specified> Principal Problem: <principal problem not specified>  Patient Active Problem List   Diagnosis Date Noted  . Terminal care   . Pain   . Pain aggravated by physical activity   . Right hip pain   . Sleep disturbance   . Peripheral edema   . Leukocytosis   . Hyponatremia   . Sinus tachycardia   . Hypomagnesemia   . Orthostatic hypotension   . Labile blood pressure   . Cancer associated pain   . DVT of lower extremity (deep venous thrombosis) ([place])   . Metastatic lung carcinoma, right ([place])   . Hypoalbuminemia due to protein-calorie malnutrition ([place])   . Acute blood loss anemia   . Postoperative pain   . Pathological compression fracture of lumbar vertebra ([place]) 06/29/2018  . Metastatic lung cancer (metastasis from lung to other site) ([place]) 06/28/2018  . Rash 06/25/2018  . Hypokalemia 06/25/2018  . Normocytic anemia 06/25/2018  . S/P lumbar fusion 06/24/2018  . Palliative care by specialist   . Cancer related pain   . Lung cancer, primary, with metastasis from lung to other site, right ([place]) 06/14/2018  . Squamous cell lung cancer, right ([place]) 06/14/2018  . Lung cancer metastatic to bone ([place]) 06/14/2018  . Bronchitis due to tobacco use 06/07/2018  . Lumbar spine tumor 06/04/2018    Expected Discharge Date: Expected Discharge Date: 07/30/18  Team Members Present: Physician leading conference: Dr. Delice Lesch Social Worker Present: Ovidio Kin, LCSW Nurse  Present: Rayetta Pigg, RN PT Present: Georjean Mode, PT OT Present: Amy Rounds, OT SLP Present: Windell Moulding, SLP PPS Coordinator present : Gunnar Fusi     Current Status/Progress Goal Weekly Team Focus  Medical   Functional and mobility deficits secondary to metastatic lung cancer to left clavicle, ribs, L4 vertebral body with fracture. Pt is s/p L4 corpectomy and L3-5 fusion by Dr. Rolena Infante on 06/11/18  Improve mobility, wound care per Ortho, Hospice  See above   Bowel/Bladder   Continent of bladder but incontinent of bowel, last BM 2/5  Timed toileting every 2-to 3 hours and PRN  Offer toileting to patient as needed   Swallow/Nutrition/ Hydration             ADL's   max- total A overall due to pain, fatigue and refusal  Downgraded to max A overall. POC now for family ed and d/c home with hospice  Family training for [facility] lift and d/c home with hospice   Mobility   Pt did not get up on my shift but was repositioned few times when patient allows  Mod assist with transfer  Educate patient and family the need for mobility, and activity tolerance   Communication             Safety/Cognition/ Behavioral Observations            Pain   Pain management with norco and dilaudid prn  Pain level of  2 or less  Assess pain q shift and prn   Skin   Back incision and wound vac at 125 mmHg. Scheduled for washout, and wound closure on 2/5  No new skin breakdown  Assess skin every shift and prn, document and report any changes      *See Care Plan and progress notes for long and short-term goals.     Barriers to Discharge  Current Status/Progress Possible Resolutions Date Resolved   Physician    Medical stability  DVT  See above  Hospice care      Nursing                  PT                    OT                  SLP                SW                Discharge Planning/Teaching Needs:  Pt and wife after talking with Oncologist have decided home with Hospice servies, wife has  also hired private duty assist. Both want to get home and be with their cats.      Team Discussion:  Wound closure done in OR today. Therapy trying to do some family training with wife. Hospice referral made and to meet with wife and pt and work on equipment needs, to DC by Friday. Pain issues contnue and pt refuses some meds. Get pt home Friday via ambulance with hospice services  Revisions to Treatment Plan:  DC 2/7    Continued Need for Acute Rehabilitation Level of Care: The patient requires daily medical management by a physician with specialized training in physical medicine and rehabilitation for the following conditions: Daily direction of a multidisciplinary physical rehabilitation program to ensure safe treatment while eliciting the highest outcome that is of practical value to the patient.: Yes Daily medical management of patient stability for increased activity during participation in an intensive rehabilitation regime.: Yes Daily analysis of laboratory values and/or radiology reports with any subsequent need for medication adjustment of medical intervention for : Post surgical problems;Other;Wound care problems;Nutritional problems   I attest that I was present, via teleconference lead the team conference, and concur with the assessment and plan of the team.   Elease Hashimoto 07/28/2018, 2:44 PM

## 2018-07-28 NOTE — Op Note (Signed)
Operative report  Preoperative diagnosis: Wound infection.  Prior posterior lumbar decompression instrumented fusion for metastatic lung cancer  Postoperative diagnosis: Same  Operative procedure: Repeat I&D and primary closur of posterior lumbar wound  First assistant: Cleta Alberts, PA  Complications: None  Indications: This is a very pleasant 73 year old gentleman who was recently diagnosed with metastatic lung cancer who ultimately underwent a lateral L4 corpectomy with L3-5 instrumentation and posterior L3 5 instrumentation as well as a posterior L4-5 decompression.  Unfortunately postoperatively the posterior lumbar wound became infected.  He ultimately had a formal washout and VAC placement with repeat washout and ongoing VAC treatment.  Recently the patient's wound was reevaluated and was noted to have healthy granulation tissue forming with no evidence of purulent drainage, foul odor, or ongoing signs of infection.  As a result we elected to return to the OR today for repeat washout and primary wound closure.  All appropriate risks benefits and alternatives to surgery were discussed with the patient and family and consent was obtained.  Operative report: Patient was brought the operating room placed upon the operating room table.  After successful induction of general anesthesia and additional intubation teds SCDs were applied and he was turned prone onto the Wilson frame.  All bony prominences well-padded and the back was prepped and draped in standard fashion.  Should be noted that the patient was noted to have a sacral decubitus early stage.  There was erythema and some mild drainage but no deep wound.  Timeout was taken to confirm patient procedure and all other important data.  The back was prepped and draped with Betadine scrub and paint.  Pulse lavage total of 3 L was used to irrigate both surgical wound sites.  There was healthy granulation tissue noted and bleeding at the wound  edges.  Sharp debridement was done removing any questionable tissue.  I then laced vancomycin powder in the deep layer and then loosely reapproximated the deep fascia with interrupted #1 Prolene suture.  I then placed vancomycin powder superficially and then closed loosely with interrupted 2-0 Vicryl suture.  I then used a running vertical 2-0 Prolene mattress suture to reapproximate the skin edges.  I confirmed that the skin edges were not under tension in the wound.  Well reapproximated.  At the smaller secondary wound after re-irrigation I placed vancomycin powder in the deep fascia and closed it with a interrupted #1 Prolene suture I then placed vancomycin powder superficially and then closed in similar fashion as I did other wound.  This was a 2-0 Vicryl and then 2-0 Prolene vertical mattress.  A dry dressing was applied over the wound.  The sacral decubitus area was then cleaned and antibiotic ointment applied and a sacral well-padded dressing was applied.  Patient was ultimately extubated transfer the PACU without incident.  The end of the case all needle sponge counts were correct.  There are no adverse intraoperative events.

## 2018-07-28 NOTE — Progress Notes (Signed)
Daily Progress Note   Patient Name: Brian [name]       Date: 07/28/2018 DOB: 1946-04-14  Age: 73 y.o. MRN#: [id number] Attending Physician: Jamse Arn, MD Primary Care Physician: [name], MD Admit Date: 06/29/2018  Reason for Consultation/Follow-up: Establishing goals of care  Subjective: Patient currently in [place].  GOC:   F/u with wife [name] and cousin. [name] is waiting to hear from hospice liaison to further discuss plan for home hospice including medical equipment. She requests Kaydon does not get discharged until after 1pm on Friday because the attorney is coming back to complete paperwork. She has arranged personal caregivers along with support from hospice.  [name] shares that Starsky is lucid today. She does not want any medication adjustments until after Friday in hopes that he will be lucid and able to complete paperwork with attorney.   Answered questions/concerns. Emotional support provided. [name] has PMT contact information.    Length of Stay: 29  Current Medications: Scheduled Meds:  . [MAR Hold] carvedilol  6.25 mg Oral BID WC  . [MAR Hold] clobetasol ointment   Topical BID  . [MAR Hold] diclofenac sodium  2 g Topical QID  . [MAR Hold] DULoxetine  60 mg Oral Daily  . [MAR Hold] feeding supplement (PRO-STAT SUGAR FREE 64)  30 mL Oral TID WC & HS  . [MAR Hold] fentaNYL  1 patch Transdermal Q72H  . [MAR Hold] furosemide  20 mg Oral BID  . [MAR Hold] gabapentin  300 mg Oral TID  . [MAR Hold] guaiFENesin  10 mL Oral Q6H  . [MAR Hold] hydrocerin   Topical BID  . [MAR Hold] magnesium oxide  200 mg Oral Daily  . [MAR Hold] methocarbamol  750 mg Oral QID  . [MAR Hold] pantoprazole  40 mg Oral Daily  . [MAR Hold] potassium chloride  30 mEq Oral BID  . [MAR  Hold] senna-docusate  2 tablet Oral QPC supper  . [MAR Hold] tamsulosin  0.4 mg Oral QPC supper    Continuous Infusions: . [MAR Hold] sodium chloride 10 mL/hr at 07/25/18 0922  . [MAR Hold] cefTRIAXone (ROCEPHIN)  IV 2 g (07/28/18 0927)  . lactated ringers Stopped (07/21/18 1930)  . lactated ringers 10 mL/hr at 07/28/18 9935    Physical Exam: Unable to complete. Patient in OR.  PRN Meds: [MAR Hold] sodium chloride, [MAR Hold] camphor-menthol, [MAR Hold] diphenhydrAMINE, [MAR Hold] diphenhydrAMINE, [MAR Hold] heparin lock flush, [MAR Hold] HYDROcodone-acetaminophen, [MAR Hold] HYDROmorphone HCl, [MAR Hold] menthol-cetylpyridinium **OR** [MAR Hold] phenol, [MAR Hold] polyethylene glycol, [MAR Hold] prochlorperazine **OR** [MAR Hold] prochlorperazine **OR** [MAR Hold] prochlorperazine, [MAR Hold] sodium chloride flush, [MAR Hold] sodium phosphate   Vital Signs: BP 115/74   Pulse 100   Temp 97.7 F (36.5 C) (Oral)   Resp 16   Ht 5\' 10"  (1.778 m)   Wt 85.4 kg   SpO2 97%   BMI 27.01 kg/m  SpO2: SpO2: 97 % O2 Device: O2 Device: Nasal Cannula O2 Flow Rate: O2 Flow Rate (L/min): 3 L/min  Intake/output summary:   Intake/Output Summary (Last 24 hours) at 07/28/2018 1003 Last data filed at 07/28/2018 0845 Gross per 24 hour  Intake 130 ml  Output 475 ml  Net -345 ml   LBM: Last BM Date: 07/27/18 Baseline Weight: Weight: 96.8 kg Most recent weight: Weight: 85.4 kg       Palliative Assessment/Data: PPS 40%     Patient Active Problem List   Diagnosis Date Noted  . Terminal care   . Pain   . Pain aggravated by physical activity   . Right hip pain   . Sleep disturbance   . Peripheral edema   . Leukocytosis   . Hyponatremia   . Sinus tachycardia   . Hypomagnesemia   . Orthostatic hypotension   . Labile blood pressure   . Cancer associated pain   . DVT of lower extremity (deep venous thrombosis) ([place])   . Metastatic lung carcinoma, right ([place])   . Hypoalbuminemia due to  protein-calorie malnutrition ([place])   . Acute blood loss anemia   . Postoperative pain   . Pathological compression fracture of lumbar vertebra ([place]) 06/29/2018  . Metastatic lung cancer (metastasis from lung to other site) ([place]) 06/28/2018  . Rash 06/25/2018  . Hypokalemia 06/25/2018  . Normocytic anemia 06/25/2018  . S/P lumbar fusion 06/24/2018  . Palliative care by specialist   . Cancer related pain   . Lung cancer, primary, with metastasis from lung to other site, right ([place]) 06/14/2018  . Squamous cell lung cancer, right ([place]) 06/14/2018  . Lung cancer metastatic to bone ([place]) 06/14/2018  . Bronchitis due to tobacco use 06/07/2018  . Lumbar spine tumor 06/04/2018    Palliative Care Assessment & Plan   Patient Profile: 73 y.o. male  with past medical history of rheumatoid arthritis admitted on 06/04/2018 with low back pain, radicular pain into right buttocks and leg, and rib/left shoulder pain. CT myelogram ordered outpatient and revealed L4 pathologic compression fracture with lytic destruction and extraosseous tumor encroaching into spinal canal and L4-L5 intervertebral foramina. Seen in outpatient ortho office and decision made for direct admission for further workup. 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 Patient s/p LV corpectomy on 12/20 and L3-L5 spinal fusion on 12/22. Hospitalization and course of rehab complicated by left DVT and surgical site wound infection. Oncology following.   Assessment: Metastatic lung cancer  L4 vertebral body fracture s/p L4 corpectomy and L3-5 fusion Left DVT Cancer related pain Lumbar wound infection s/p I&D and wound vac  Recommendations/Plan:  DNR/DNI in the event of cardiac arrest. Continue current plan of care.   Continue current symptom management regimen.   After conversation with oncology, wife planning to take patient home with support of hospice services and private caregivers.  She wishes to focus on his comfort and symptom management. SW assisting with disposition. [facility] referral has been placed.   Wife requesting that Jerric not be discharged until Friday afternoon because of follow-up meeting with attorney at [place]. Will notify care team.  Code Status: DNR   Code Status Orders  (From admission, onward)         Start     Ordered   06/29/18 1526  Full code  Continuous     06/29/18 1525        Code Status History    Date Active Date Inactive Code Status Order ID Comments User Context   06/11/2018 2031 06/29/2018 1440 Full Code [id number]  Milagros Loll, MD Inpatient   06/11/2018 2011 06/11/2018 2030 Full Code [id number]  Melina Schools, MD Inpatient   06/04/2018 1344 06/11/2018 2011 Full Code [id number]  Ardeen Jourdain, PA-C Inpatient       Prognosis:   Poor prognosis with metastatic lung cancer, prolonged hospitalization/rehab stay secondary to DVT and wound infection, ongoing cancer related pain.   Discharge Planning:  Home with Hospice   Care plan was discussed with wife, cousin  Thank you for allowing the Palliative Medicine Team to assist in the care of this patient.   Time In: 0945 Time Out: 1000 Total Time 15 Prolonged Time Billed no      Greater than 50%  of this time was spent counseling and coordinating care related to the above assessment and plan.  Ihor Dow, FNP-C Palliative Medicine Team  Phone: [phone] Fax: [phone]  Please contact Palliative Medicine Team phone at [phone] for questions and concerns.

## 2018-07-28 NOTE — Brief Op Note (Signed)
07/28/2018  11:03 AM  PATIENT:  [name]  73 y.o. male  PRE-OPERATIVE DIAGNOSIS:  lumbar wound  POST-OPERATIVE DIAGNOSIS:  lumbar wound  PROCEDURE:  Procedure(s): LUMBAR WOUND DEBRIDEMENT (N/A)  SURGEON:  Surgeon(s) and Role:    Melina Schools, MD - Primary  PHYSICIAN ASSISTANT:   ASSISTANTS: Amanda Ward   ANESTHESIA:   general  EBL:  none   BLOOD ADMINISTERED:none  DRAINS: none   LOCAL MEDICATIONS USED:  NONE  SPECIMEN:  No Specimen  DISPOSITION OF SPECIMEN:  N/A  COUNTS:  YES  TOURNIQUET:  * No tourniquets in log *  DICTATION: .Dragon Dictation  PLAN OF CARE: Admit to inpatient   PATIENT DISPOSITION:  PACU - hemodynamically stable.

## 2018-07-28 NOTE — Progress Notes (Signed)
Occupational Therapy Session Note  Patient Details  Name: Brian [name] MRN: [id number] Date of Birth: 1945/09/17  Today's Date: 07/28/2018 OT Individual Time: 1310-1400 OT Individual Time Calculation (min): 50 min  OT Missed Time: 10 min (pt fatigue)   Short Term Goals: Week 4:  OT Short Term Goal 1 (Week 4): Pt will complete toileting task with assist +1 in order to reduce caregiver burden OT Short Term Goal 2 (Week 4): Pt will verbalize correct set-up of w/c in prep for sliding board transfer with no more than 1 VC OT Short Term Goal 3 (Week 4): Pt will transfer to EOB with supervision using hospital bed functions with supervision in prep for ADL task  Skilled Therapeutic Interventions/Progress Updates:    Pt seen for OT session focusing on family education, specifically with bed mobility. Pt in supine upon arrival, letharigc having just come back from surgery. With lots of encouragement and education pt willing to participate in education in regards to bed mobility with pt's cousin, Brian [name] who will be assisting at d/c.  Extensive education and demonstration provided regarding use of hospital bed functions, bed mobility techniques including log rolling, use of chuck pad and how to complete LB dressing total A from bed level. With min A and cuing throughout from therapist, pt's cousin able to assist and direct with all bed mobility tasks, changing pt's brief, donning shorts, and removing/replacing chuck pad. Education and discussion regarding goals of therapy, plan for education, DME including use of Hoyer lift vs. Sliding board. Pt's need for +2 assist during any transfers at d/c and role of hospice care in regards to ADLs at d/c. Pt left resting in supine at end of session, grateful to therapist, all needs in reach and cousin present.   10 minutes missed this session as therapist meeting with therapy supervisor in regards to how to manage pt case given need for family education  to be completed, however, with limited pt participation 2/2 lethargy.   Therapy Documentation Precautions:  Precautions Precautions: Back Precaution Comments: re-educated on BLT, pt cont to be reminded during function Required Braces or Orthoses: Spinal Brace Spinal Brace: Thoracolumbosacral orthotic, Applied in sitting position Restrictions Weight Bearing Restrictions: No Pain: Pain Assessment Pain Scale: 0-10 Pain Score: 7  Pain Type: Chronic pain Pain Location: Back Pain Orientation: Lower;Mid Pain Descriptors / Indicators: Aching;Discomfort Pain Frequency: Constant Pain Onset: On-going Patients Stated Pain Goal: 3 Pain Intervention(s): Repositioned;Emotional support   Therapy/Group: Individual Therapy  Layton Naves L 07/28/2018, 7:13 AM

## 2018-07-28 NOTE — Progress Notes (Signed)
Patient's cousin and wife assisted with brief change and skin care with staff cues. Demonstrated understanding.

## 2018-07-28 NOTE — Progress Notes (Signed)
Social Work Patient ID: Brian [name], male   DOB: Apr 17, 1946, 73 y.o.   MRN: [id number] Met with wife to discuss if Hospice had spoke with her. Venia Carbon had and have begun the arrangements for equipment delivery and services in place for discharge Friday. Wound closure today in OR wound vac discharged. Will ask regarding IV antibiotics and if can transition to oral. Will work toward discharge Friday. Wife reports will need to be after 1:00 pm due to lawyer coming back to room Friday am.

## 2018-07-29 ENCOUNTER — Inpatient Hospital Stay (HOSPITAL_COMMUNITY): Payer: Medicare Other | Admitting: Occupational Therapy

## 2018-07-29 ENCOUNTER — Inpatient Hospital Stay (HOSPITAL_COMMUNITY): Payer: Medicare Other

## 2018-07-29 ENCOUNTER — Encounter (HOSPITAL_COMMUNITY): Payer: Self-pay | Admitting: Orthopedic Surgery

## 2018-07-29 ENCOUNTER — Encounter: Payer: Self-pay | Admitting: *Deleted

## 2018-07-29 MED ORDER — SODIUM CHLORIDE 0.9% FLUSH
10.0000 mL | INTRAVENOUS | Status: DC | PRN
  Administered 2018-07-29: 10 mL

## 2018-07-29 NOTE — Progress Notes (Signed)
Chart reviewed and spoke with wife, [name] via telephone. Plan remains home with hospice services tomorrow. [name] does not have any questions or concerns for me this afternoon. She has PMT contact information if needed.  NO CHARGE  Ihor Dow, FNP-C Palliative Medicine Team  Phone: [phone] Fax: [phone]

## 2018-07-29 NOTE — Progress Notes (Signed)
Per Dr. Marin Olp, I faxed paperwork to [facility]. Fax # is [phone]. Paperwork placed in brown filing cabinet behind nurses station.

## 2018-07-29 NOTE — Progress Notes (Signed)
Hospice and [facility]  DME ordered, will be delivered 2/6 between 4-8pm.  Noted change in oral abx, thank you for collaboration.  We will tentatively schedule a RN to visit Saturday am, [name] had indicated they would be ok through Friday night and this would give him time to settle in.  Please call with any questions or concerns, Venia Carbon BSN, RN [facility] Liaison (listed in [place]) [phone]

## 2018-07-29 NOTE — Progress Notes (Signed)
Physical Therapy Discharge Summary  Patient Details  Name: Brian [name] MRN: [id number] Date of Birth: Jul 09, 1945  Today's Date: 07/29/2018 PT Individual Time:  -  pt missed 30 min due to weakness due to recent diarrhea    Patient has met 1 of 6 long term goals due to improved balance, ability to compensate for deficits and improved awareness.  Patient to discharge at a wheelchair level [place].   Patient's care partner is independent to provide the necessary physical and cognitive assistance at discharge.  Reasons goals not met: rapidly progressing CA; weakness and poor activity tolerance  Recommendation:  Patient will benefit from ongoing skilled PT services in home health setting to continue to advance safe functional mobility, address ongoing impairments in strength, pain, activity tolerance and minimize fall risk.  Equipment: No equipment provided; Hospice to provide  Reasons for discharge: change in medical status and discharge from hospital; rapid progression of CA  Patient/family agrees with progress made and goals achieved: Yes  PT Discharge Precautions/Restrictions Precautions Precautions: Back Required Braces or Orthoses: Spinal Brace Spinal Brace: Thoracolumbosacral orthotic;Applied in sitting position Restrictions Weight Bearing Restrictions: No  Pain Pain Assessment Pain Scale: 0-10 Pain Score: 3  Faces Pain Scale: Hurts a little bit Pain Type: Surgical pain Pain Location: Shoulder Pain Orientation: Right Pain Descriptors / Indicators: Aching Pain Intervention(s): Medication (See eMAR);Repositioned Vision/Perception  Perception Perception: Within Functional Limits Praxis Praxis: Intact  Cognition Overall Cognitive Status: Impaired/Different from baseline Arousal/Alertness: Lethargic Orientation Level: Oriented X4 Memory: Impaired Memory Impairment: Decreased short term memory Problem Solving: Impaired Problem Solving Impairment: Verbal  basic;Functional basic Behaviors: Poor frustration tolerance Safety/Judgment: Appears intact Comments: All cognitive and motor tasks delayed 2/2 fatigue and effects of pain meds Sensation Sensation Light Touch: Appears Intact Coordination Gross Motor Movements are Fluid and Coordinated: No Fine Motor Movements are Fluid and Coordinated: No Coordination and Movement Description: Severe global generalized weakness as well as impaired due to pain, effects of medication and fatigue Motor  Motor Motor: Other (comment) Motor - Discharge Observations: Severe generalized weakness  Mobility Bed Mobility Bed Mobility: Right Sidelying to Sit;Sit to Sidelying Right Rolling Right: Minimal Assistance - Patient > 75% Rolling Left: Minimal Assistance - Patient > 75% Right Sidelying to Sit: Moderate Assistance - Patient 50-74% Sit to Sidelying Right: Maximal Assistance - Patient 25-49% Transfers Transfers: Lateral/Scoot Transfers Lateral/Scoot Transfers: Moderate Assistance - Patient 50-74% Transfer (Assistive device): Other (Comment)(slide board) Locomotion  Gait Ambulation: No Gait Gait: No Stairs / Additional Locomotion Stairs: No Wheelchair Mobility Wheelchair Mobility: No(focus on family ed; pt has propelled in the past with supervision for short distances)  Trunk/Postural Assessment  Cervical Assessment Cervical Assessment: Within Functional Limits Thoracic Assessment Thoracic Assessment: Exceptions to WFL(Back pre-cautions; TLSO) Lumbar Assessment Lumbar Assessment: Exceptions to WFL(Back pre-cautions) Postural Control Postural Control: Deficits on evaluation Righting Reactions: delayed Protective Responses: delayed and inadequate  Balance Balance Balance Assessed: Yes Static Sitting Balance Static Sitting - Level of Assistance: 5: Stand by assistance Dynamic Sitting Balance Dynamic Sitting - Balance Support: Feet supported;Bilateral upper extremity supported Dynamic  Sitting - Level of Assistance: 4: Min assist Dynamic Sitting Balance - Compensations: Sitting EOB, leaning within BOS Static Standing Balance Static Standing - Level of Assistance: Not tested (comment) Dynamic Standing Balance Dynamic Standing - Level of Assistance: Not tested (comment) Extremity Assessment  RLE Assessment RLE Assessment: Not tested Passive Range of Motion (PROM) Comments: heel cords tight General Strength Comments: NT; strength is limiting factor in mobility and locomotion  LLE Assessment LLE Assessment: Not tested Passive Range of Motion (PROM) Comments: heel cords tight General Strength Comments: NT; strength is limiting factor in mobilty and locomotion    Raiyan Dalesandro 07/29/2018, 8:13 AM

## 2018-07-29 NOTE — Progress Notes (Signed)
    Subjective: Procedure(s) (LRB): LUMBAR WOUND DEBRIDEMENT (N/A) 1 Day Post-Op  Patient reports pain as 4 on 0-10 scale.  Reports decreased leg pain denies incisional back pain   Positive void Positive bowel movement Positive flatus Negative chest pain or shortness of breath  Objective: Vital signs in last 24 hours: Temp:  [98 F (36.7 C)-98.5 F (36.9 C)] 98 F (36.7 C) (02/06 0518) Pulse Rate:  [70-97] 70 (02/06 0650) Resp:  [16-18] 18 (02/06 0650) BP: (97-109)/(64-72) 109/64 (02/06 0650) SpO2:  [74 %-100 %] 74 % (02/06 0650)  Intake/Output from previous day: 02/05 0701 - 02/06 0700 In: 1420 [P.O.:120; I.V.:800; IV Piggyback:500] Out: 200 [Urine:200]  Labs: Recent Labs    07/28/18 0525  WBC 21.5*  RBC 3.83*  HCT 35.0*  PLT 436*   Recent Labs    07/28/18 0525  NA 142  K 3.3*  CL 106  CO2 27  BUN 12  CREATININE 0.62  GLUCOSE 117*  CALCIUM 7.4*   No results for input(s): LABPT, INR in the last 72 hours.  Physical Exam: ABD soft Incision: dressing C/D/I and scant drainage Compartment soft Body mass index is 27.01 kg/m.   Assessment/Plan: Patient stable  CT scan: hematoma is resolving - decreased in size.  Hardware secure no subsidence or complication noted Continue mobilization with physical therapy Continue care  Up with therapy  Plan on d/c to hospice tomorrow 1. F/u 1 week if able for wound check 2. TLSO brace is prn comfort  3. Wound: no significant drainage is noted.  Area is clean and dry.  Dressings changed.  Will need BID dry dressing changes 4. Continue IV ABXs as recommended 5. Continue to encourage mobilization and LE ROM excercises 6. Pain control per hospice 7. Important to monitor sacral breakdown.  Must log roll q2 hrs and avoid long period of compression on the area.  Melina Schools, MD [facility] [phone]

## 2018-07-29 NOTE — Progress Notes (Signed)
Discussed patient with Dr. Tommy Medal. Wound culture positive for e coli but no evidence of infection in bone per CT. He recommends transitioning patient to amoxicillin 500 mg tid X 2 weeks to complete course for treatment of wound infection.

## 2018-07-29 NOTE — Progress Notes (Signed)
Social Work Patient ID: Brian [name], male   DOB: Sep 15, 1945, 72 y.o.   MRN: [id number] Met with wife who has spoken with [facility] and [facility] regarding arranging caregivers at home. [facility] to bring equipment out to home today between 4-8 pm. Will arrange ambulance transport in the am. Pt ready to go home and be with his cats. See in am to arrange transport.

## 2018-07-29 NOTE — Progress Notes (Signed)
Subjective: 1 Day Post-Op Procedure(s) (LRB): LUMBAR WOUND DEBRIDEMENT (N/A)  Objective: Vital signs in last 24 hours: Temp:  [97.3 F (36.3 C)-98.5 F (36.9 C)] 98 F (36.7 C) (02/06 0518) Pulse Rate:  [70-97] 70 (02/06 0650) Resp:  [10-18] 18 (02/06 0650) BP: (97-111)/(64-77) 109/64 (02/06 0650) SpO2:  [74 %-100 %] 74 % (02/06 0650)  Intake/Output from previous day: 02/05 0701 - 02/06 0700 In: 0722 [P.O.:120; I.V.:800; IV Piggyback:500] Out: 200 [Urine:200] Intake/Output this shift: No intake/output data recorded.  Recent Labs    07/26/18 1214 07/28/18 0525  HGB 8.9* 9.9*   Recent Labs    07/26/18 1214 07/28/18 0525  WBC 17.0* 21.5*  RBC 3.33* 3.83*  HCT 30.9* 35.0*  PLT 349 436*   Recent Labs    07/26/18 1214 07/28/18 0525  NA 142 142  K 3.5 3.3*  CL 104 106  CO2 26 27  BUN 13 12  CREATININE 0.57* 0.62  GLUCOSE 111* 117*  CALCIUM 7.2* 7.4*   Recent Labs    07/26/18 1214  INR 1.21    Honey comb bandage and Steri-strips in place. Hematogenous/serous drainage present. No erythema or warmth surrounding incisions.   Assessment/Plan: 1 Day Post-Op Procedure(s) (LRB): LUMBAR WOUND DEBRIDEMENT (N/A) Continue current treatment plans as outlines by ID, oncology, hospice team, social work.   I did not change the dressing at this time. I have contacted my attending to determine how to move forward in regards to the wound drainage.   Yvonne Kendall Ward 07/29/2018, 11:14 AM

## 2018-07-29 NOTE — Plan of Care (Signed)
  Problem: Consults Goal: RH SPINAL CORD INJURY PATIENT EDUCATION Description  See Patient Education module for education specifics.  Outcome: Progressing Goal: Skin Care Protocol Initiated - if Braden Score 18 or less Description If consults are not indicated, leave blank or document N/A Outcome: Progressing   Problem: SCI BOWEL ELIMINATION Goal: RH STG MANAGE BOWEL WITH ASSISTANCE Description STG Manage Bowel with Mod.Assistance.  Outcome: Progressing Goal: RH STG SCI MANAGE BOWEL WITH MEDICATION WITH ASSISTANCE Description STG SCI Manage bowel with medication with Mod.assistance.  Outcome: Progressing Goal: RH STG MANAGE BOWEL W/EQUIPMENT W/ASSISTANCE Description STG Manage Bowel With Equipment With mod. Assistance  Outcome: Progressing Goal: RH STG SCI MANAGE BOWEL PROGRAM W/ASSIST OR AS APPROPRIATE Description STG SCI Manage bowel program w/mod.assist or as appropriate.  Outcome: Progressing   Problem: SCI BLADDER ELIMINATION Goal: RH STG MANAGE BLADDER WITH ASSISTANCE Description STG Manage Bladder With mod.Assistance  Outcome: Progressing   Problem: RH SKIN INTEGRITY Goal: RH STG SKIN FREE OF INFECTION/BREAKDOWN Description With mod. Assist.  Outcome: Progressing Goal: RH STG MAINTAIN SKIN INTEGRITY WITH ASSISTANCE Description STG Maintain Skin Integrity With Mod.Assistance.  Outcome: Progressing Goal: RH STG ABLE TO PERFORM INCISION/WOUND CARE W/ASSISTANCE Description STG Able To Perform Incision/Wound Care With Mod.Assistance.  Outcome: Progressing   Problem: RH SAFETY Goal: RH STG ADHERE TO SAFETY PRECAUTIONS W/ASSISTANCE/DEVICE Description STG Adhere to Safety Precautions With Mod.Assistance/Device.  Outcome: Progressing   Problem: RH PAIN MANAGEMENT Goal: RH STG PAIN MANAGED AT OR BELOW PT'S PAIN GOAL Description Less than 3,on 1 to 10 scale.  Outcome: Progressing

## 2018-07-29 NOTE — Progress Notes (Signed)
[place] PHYSICAL MEDICINE & REHABILITATION PROGRESS NOTE  Subjective/Complaints: Patient seen working with therapies and family education this morning.  He states he slept well overnight.  Wife with questions regarding IV antibiotics and hospice.  He states he tolerated the wound closure well yesterday.  ROS: Denies CP, shortness of breath, nausea, vomiting, diarrhea.  Objective: Vital Signs: Blood pressure 109/64, pulse 70, temperature 98 F (36.7 C), temperature source Oral, resp. rate 18, height '5\' 10"'$  (1.778 m), weight 85.4 kg, SpO2 (!) 74 %. Ct Lumbar Spine Wo Contrast  Result Date: 07/29/2018 CLINICAL DATA:  73 year old male with history of metastatic lung cancer, status post lateral L4 corpectomy with L3-5 instrumentation as well as posterior L4-5 decompression. Course complicated by surgical wound infection, status post recent washout and debridement. Evaluate hardware, right psoas hematoma, and possible locally recurrent disease. EXAM: CT LUMBAR SPINE WITHOUT CONTRAST TECHNIQUE: Multidetector CT imaging of the lumbar spine was performed without intravenous contrast administration. Multiplanar CT image reconstructions were also generated. COMPARISON:  Prior CT from 06/23/2018. FINDINGS: Segmentation: Standard. Lowest well-formed disc labeled the L5-S1 level. Alignment: Mild diffuse dextroscoliosis. Trace 2 mm retrolisthesis of L2 on L3 also unchanged. No interval malalignment or subluxation. Vertebrae: Again seen is prior L4 corpectomy with posterior L3 through L5 intra mentation. Hardware is stable in position and alignment and remains intact. No periprosthetic lucency to suggest loosening or failure. There has been progressive erosive changes with lucency seen within the surrounding L4 vertebral body with decreased intact cortex as compared to previous exam, suspicious for possible persistent and/or recurrent metastasis. This is most notable within the left pedicle of L4 (series 4, image  76). Delineation of any potential discrete margins fairly limited on this noncontrast examination. Paraspinal and other soft tissues: Extensive soft tissue stranding with few scattered foci of emphysema seen involving the posterior paraspinous soft tissues at the level of the L4 decompression, most likely related to recent debridement and washout. No definite discrete collections identified within this region. Previously seen bilateral psoas hematomas, right greater than left are partially visualized, and overall decreased in size as compared to previous exam, now subacute in appearance. Discrete ovoid collection at the inferior margin of the right psoas muscle measuring 1.5 x 1.9 cm most compatible with subacute hematoma (series 5, image 115). No visible new hemorrhage. Aortic atherosclerosis with intra-abdominal aneurysm, partially visualized, grossly similar to previous. IVC filter noted. Disc levels: Remainder of the lumbar spine relatively stable in appearance without new or progressive findings elsewhere. IMPRESSION: 1. Postoperative changes from recent posterior lumbar wound debridement and washout without complication. 2. Interval decrease in bilateral psoas hematomas, right greater than left. These are now smaller in size and subacute in appearance. No evidence for new hemorrhage. 3. Stable position and alignment of L4 corpectomy with L3-L5 instrumentation. No hardware complication. 4. Progressive lucency with osseous erosion about the remaining L4 vertebral body and posterior elements, concerning for progressive malignancy. Finding could be further assessed with dedicated MRI as clinically warranted. Electronically Signed   By: Jeannine Boga M.D.   On: 07/29/2018 05:42   Recent Labs    07/26/18 1214 07/28/18 0525  WBC 17.0* 21.5*  HGB 8.9* 9.9*  HCT 30.9* 35.0*  PLT 349 436*   Recent Labs    07/26/18 1214 07/28/18 0525  NA 142 142  K 3.5 3.3*  CL 104 106  CO2 26 27  GLUCOSE 111*  117*  BUN 13 12  CREATININE 0.57* 0.62  CALCIUM 7.2* 7.4*  Physical Exam: BP 109/64 (BP Location: Left Arm)   Pulse 70   Temp 98 F (36.7 C) (Oral)   Resp 18   Ht '5\' 10"'$  (1.778 m)   Wt 85.4 kg   SpO2 (!) 74%   BMI 27.01 kg/m  Constitutional: No distress . Vital signs reviewed. HENT: Normocephalic.  Atraumatic. Eyes: EOMI. No discharge. Cardiovascular: RRR.  No JVD. Respiratory: CTA bilaterally.  Normal effort.  +[place] GI: BS +. Non-distended. Musculoskeletal: No edema or tenderness in extremities Neurological: He is alert and oriented  Dysphonia Able to follow basic commands without difficulty.  Motor:  Right lower extremity: 4/5 proximal distal, pain inhibition, unchanged Left lower extremity: Hip flexion 4-/5, knee extension 4/5, ankle dorsiflexion 4/5, some pain inhibition, unchanged Skin: Warm and dry. Intact. Psychiatric: Flat  Assessment/Plan: 1. Functional deficits secondary to lung cancer with mets to ribs, vertebrae with oncological fracture status post lumbar fusion which require 3+ hours per day of interdisciplinary therapy in a comprehensive inpatient rehab setting.  Physiatrist is providing close team supervision and 24 hour management of active medical problems listed below.  Physiatrist and rehab team continue to assess barriers to discharge/monitor patient progress toward functional and medical goals  Care Tool:  Bathing  Bathing activity did not occur: Safety/medical concerns(Unable to assess 2/2 drop in BP with upright positioning) Body parts bathed by patient: Right arm, Left arm, Chest, Abdomen, Front perineal area, Right upper leg, Left upper leg, Face   Body parts bathed by helper: Right lower leg, Left lower leg, Buttocks     Bathing assist Assist Level: Moderate Assistance - Patient 50 - 74%     Upper Body Dressing/Undressing Upper body dressing Upper body dressing/undressing activity did not occur (including orthotics): Safety/medical  concerns(B/P issues ) What is the patient wearing?: Orthosis Orthosis activity level: Performed by helper  Upper body assist Assist Level: Total Assistance - Patient < 25%    Lower Body Dressing/Undressing Lower body dressing      What is the patient wearing?: Pants, Incontinence brief     Lower body assist Assist for lower body dressing: 2 Helpers     Toileting Toileting Toileting Activity did not occur (Clothing management and hygiene only): Safety/medical concerns(Unable to assess 2/2 drop in BP with upright positioning)  Toileting assist Assist for toileting: 2 Helpers     Transfers Chair/bed transfer  Transfers assist     Chair/bed transfer assist level: Moderate Assistance - Patient 50 - 74%     Locomotion Ambulation   Ambulation assist   Ambulation activity did not occur: Safety/medical concerns  Assist level: Minimal Assistance - Patient > 75% Assistive device: Walker-rolling Max distance: 23'   Walk 10 feet activity   Assist     Assist level: Moderate Assistance - Patient - 50 - 74% Assistive device: Walker-rolling   Walk 50 feet activity   Assist           Walk 150 feet activity   Assist           Walk 10 feet on uneven surface  activity   Assist           Wheelchair     Assist Will patient use wheelchair at discharge?: Yes Type of Wheelchair: Manual Wheelchair activity did not occur: Safety/medical concerns(hypotensive)  Wheelchair assist level: Supervision/Verbal cueing Max wheelchair distance: 150    Wheelchair 50 feet with 2 turns activity    Assist    Wheelchair 50 feet with 2 turns activity did not  occur: Safety/medical concerns   Assist Level: Supervision/Verbal cueing   Wheelchair 150 feet activity     Assist Wheelchair 150 feet activity did not occur: Safety/medical concerns   Assist Level: Supervision/Verbal cueing      Medical Problem List and Plan: 1.Functional and mobility  deficitssecondary to metastatic lung cancer to left clavicle, ribs, L4 vertebral body with fracture. Pt is s/p L4 corpectomy and L3-5 fusion by Dr. Rolena Infante on 06/11/18  Continue CIR  Per hematology/oncology, new chest wall as well as lung mass on CT chest from 1/30.  Rad-onc involved. CHest wall biopsied, results pending. Status post I&D of back wound 1/17 -repeat I and D with wound vac placement, closed on 2/5.  CT ordered by Ortho, reviewed, showing progressive malignancy  Per biopsy, poorly differentiated squamous cell carcinoma with necrosis  Plan for DC tomorrow 2. DVT: dopplers done this evening and patient has DVT in left CFV             IVC filter placement on 1/8  Pt not a candidate for anticoagulation due to hematoma and bleeding riskat present although long term he is high VTE risk 3. Pain Management: dilaudid prn. Unable to tolerate Oxycodone   Fentanyl increased to 25 mcg on 1/17, increased to 50 on 1/21- cont current dose  Appreciate palliative care consult- notes reviewed  Cymbalta added on 1/17, increased on 1/24  Right coracoid met- no biceps curls  Voltaren gel added on 1/23 with improvement  Relatively controlled at present hydrocodone for moderate and dilaudid for severe breakthough pain 4. Mood:LCSW to follow for evaluation and support.  Cymbalta added  Patient more calm when daughter not present 5. Neuropsych: This patientiscapable of making decisions on hisown behalf. 6. Skin/Wound Care:  Back wound closed on 2/5 7. Fluids/Electrolytes/Nutrition:Monitor I/O.  8.Blood pressure:  Abdominal binder as needed  Relatively controlled on 2/6, orthostatics negative  Monitor with increased mobility 9. ABLA: Continue to monitor with serial checks.   Hemoglobin 9.9 on 2/5  Iron studies consistent with anemia of chronic disease: Ferritin levels high  IV iron 10. Hypokalemia:   Potassium 3.3 on 2/5   Supplement increased on 1/13, however patient  refusing  Magnesium 2.2 on 1/24   Supplement initiated on 1/14, DC'd on 2/5 due to family requested to any medications. 11. Rash: Continue Clobetasol bidas well as hypoallergenic sheets. 12. Neuropathic pain: Improving on gabapentin 300 mg tid 13. Constipation:Managed with miralax prn--patient wants to manage his bowel program. 14.  Hypoalbuminemia  Supplement initiated on 1/8 15.  Sinus tachycardia  Metoprolol 12.5 twice daily started on 1/13, changed to Coreg on 1/15  Relatively controlled on 2/6, however patient refusing medications 16.  Hyponatremia: Resolved  Continue to monitor 17.  Wound infection  WBCs 21.5 on 2/5  Wound cultures with E. coli, pansensitive  Chest x-ray reviewed with patient, unremarkable for acute infectious process, repeat chest x-ray on 1/16 reviewed showing some lower abnormality-continue to monitor  Afebrile  After discussion with ID, IV antibiotics changed to p.o. amoxicillin  Appreciate ID recs, notes reviewed- IV ceftezole   Antibiotic beads placed by Ortho in wound  Blood cultures no growth  ESR/CRP elevated on 1/22  Back incision as above 18. Peripheral edema  Lasix 20 daily started on 1/15, increased to twice daily on 1/21 local care and elevation   Improving 19.  Sleep disturbance  Trazodone DC'd, Benadryl ordered for pruritus and sleep on 1/17  Improving. 21.  Multiple joint pain-metastases to left proximal clavicle,  right coracoid process  Right shoulder due to coracoid met- some mild tenderness, no biceps curls on right side 22.  Palliative care- pt DNR,   See palliative care discussions  Family has discussed with oncology, await further plans for?  Hospice  LOS: 30 days A FACE TO FACE EVALUATION WAS PERFORMED  Maripat Borba [name] 07/29/2018, 11:45 AM

## 2018-07-29 NOTE — Progress Notes (Signed)
Occupational Therapy Discharge Summary  Patient Details  Name: Brian [name] MRN: [id number] Date of Birth: [birth year]   Patient has met 1 of 10 long term goals.  Patient to discharge at overall Total Assist level.  Patient's care givers have completed hands on family training for bed level ADLs and basic sliding board transfers.  Pt with great functional decline while on IPR due to advancement in aggressive cancer diagnosis. Following pt's decline, family opting to d/c home with hospice services. Family will have support of hospice as well as paid personal caregivers to assist at d/c.  Recommending bed level ADLs only and w/c<>EOB via slide board transfers to pt tolerance only.  Reasons goals not met: Pt with great functional decline while on IPR 2/2 agreesive cancer and complications following infection at surgical site.   Recommendation:  Patient with no further OT needs. Pt d/c home with hospice services to provide QOL and comfort for end of life stage.  Equipment: All needed equipment will be supplied by Hospice services including hospital bed, w/c, and sliding board  Reasons for discharge: discharge from hospital and d/c home with hospice services  Patient/family agrees with progress made and goals achieved: Yes  OT Discharge Precautions/Restrictions  Precautions Precautions: Back Required Braces or Orthoses: Spinal Brace Spinal Brace: Thoracolumbosacral orthotic;Applied in sitting position Restrictions Weight Bearing Restrictions: No Vision Baseline Vision/History: Wears glasses Wears Glasses: At all times Patient Visual Report: No change from baseline Perception  Perception: Within Functional Limits Praxis Praxis: Intact Cognition Overall Cognitive Status: Impaired/Different from baseline Arousal/Alertness: Lethargic Orientation Level: Oriented X4 Memory: Impaired Memory Impairment: Decreased short term memory Problem Solving: Impaired Problem Solving  Impairment: Verbal basic;Functional basic Behaviors: Poor frustration tolerance Safety/Judgment: Appears intact Comments: All cognitive and motor tasks delayed 2/2 fatigue and effects of pain meds Sensation Sensation Light Touch: Appears Intact Coordination Gross Motor Movements are Fluid and Coordinated: No Fine Motor Movements are Fluid and Coordinated: No Coordination and Movement Description: Severe global generalized weakness as well as impaired due to pain, effects of medication and fatigue Motor  Motor Motor: Other (comment) Motor - Discharge Observations: Severe generalized weakness Trunk/Postural Assessment  Cervical Assessment Cervical Assessment: Within Functional Limits Thoracic Assessment Thoracic Assessment: Exceptions to WFL(Back pre-cautions; TLSO) Lumbar Assessment Lumbar Assessment: Exceptions to WFL(Back pre-cautions) Postural Control Postural Control: Deficits on evaluation  Balance Balance Balance Assessed: Yes Static Sitting Balance Static Sitting - Level of Assistance: 5: Stand by assistance Dynamic Sitting Balance Dynamic Sitting - Balance Support: Feet supported;Bilateral upper extremity supported Dynamic Sitting Balance - Compensations: Sitting EOB Extremity/Trunk Assessment RUE Assessment RUE Assessment: Exceptions to [facility] Strength Comments: Not formally assessed, 2/2 pain and pre-cautions. Observed 3/5 throughout with weak gross grasp LUE Assessment LUE Assessment: Exceptions to [facility] Strength Comments: Not formally assessed, 2/2 L clavicle fx. Observed 3/5 throughout with weak gross grasp   Saretta Dahlem L 07/29/2018, 7:23 AM

## 2018-07-29 NOTE — Progress Notes (Signed)
Occupational Therapy Session Note  Patient Details  Name: Brian [name] MRN: [id number] Date of Birth: May 26, 1946  Today's Date: 07/29/2018 OT Individual Time: 0830-0900 OT Individual Time Calculation (min): 30 min    Short Term Goals: Week 4:  OT Short Term Goal 1 (Week 4): Pt will complete toileting task with assist +1 in order to reduce caregiver burden OT Short Term Goal 2 (Week 4): Pt will verbalize correct set-up of w/c in prep for sliding board transfer with no more than 1 VC OT Short Term Goal 3 (Week 4): Pt will transfer to EOB with supervision using hospital bed functions with supervision in prep for ADL task  Skilled Therapeutic Interventions/Progress Updates:    Pt seen for OT session focusing on hands on training with pt's caregivers for bed mobility, toileting, and ADL re-training from bed level. Pt in supine upon arrival with wife and cousin present. Pt voiced fatigue, however, willing to participate in therapy session as able.  With wife and therapist assisting, pt completed bed mobility in order for bedpan to be placed, hygiene performed and shorts donned. Pt rolled using hospital bed functions with min A while buttock hygiene completed total A, new brief and shorts donned.  He transferred to sitting EOB using hospital bed functions with mod-max A provided from pt's wife'. Required cuing for proper body mechanics. Pt left sitting EOB at end of session with hand off to RN for cont transfer.   Therapy Documentation Precautions:  Precautions Precautions: Back Precaution Comments: re-educated on BLT, pt cont to be reminded during function Required Braces or Orthoses: Spinal Brace Spinal Brace: Thoracolumbosacral orthotic, Applied in sitting position Restrictions Weight Bearing Restrictions: No Pain: Pain Assessment Pain Scale: 0-10 Pain Score: 3  Faces Pain Scale: Hurts a little bit Pain Type: Surgical pain Pain Location: Shoulder Pain Orientation: Right Pain  Descriptors / Indicators: Aching Pain Intervention(s):Repositioned   Therapy/Group: Individual Therapy  Raynee Mccasland L 07/29/2018, 6:53 AM

## 2018-07-30 DIAGNOSIS — M8468XA Pathological fracture in other disease, other site, initial encounter for fracture: Secondary | ICD-10-CM

## 2018-07-30 MED ORDER — HEPARIN SOD (PORK) LOCK FLUSH 100 UNIT/ML IV SOLN: 250.0000 [IU] | INTRAVENOUS | Status: DC | PRN

## 2018-07-30 MED ORDER — FENTANYL 50 MCG/HR TD PT72
1.0000 | MEDICATED_PATCH | TRANSDERMAL | Status: DC
  Administered 2018-07-30: 1 via TRANSDERMAL
  Filled 2018-07-30: qty 1

## 2018-07-30 MED ORDER — HYDROCERIN EX CREA: 1.0000 "application " | TOPICAL_CREAM | Freq: Two times a day (BID) | CUTANEOUS | 0 refills | Status: AC

## 2018-07-30 MED ORDER — DIPHENOXYLATE-ATROPINE 2.5-0.025 MG PO TABS: 1.0000 | ORAL_TABLET | Freq: Four times a day (QID) | ORAL | 0 refills | Status: AC | PRN

## 2018-07-30 MED ORDER — HYDROCODONE-ACETAMINOPHEN 7.5-325 MG PO TABS: 1.0000 | ORAL_TABLET | ORAL | 0 refills | Status: AC | PRN

## 2018-07-30 MED ORDER — AMOXICILLIN 500 MG PO CAPS: 500.0000 mg | ORAL_CAPSULE | Freq: Three times a day (TID) | ORAL | 0 refills | Status: AC

## 2018-07-30 MED ORDER — DIPHENHYDRAMINE HCL 25 MG PO CAPS: 25.0000 mg | ORAL_CAPSULE | Freq: Every evening | ORAL | 0 refills | Status: AC | PRN

## 2018-07-30 MED ORDER — CARVEDILOL 6.25 MG PO TABS: 6.2500 mg | ORAL_TABLET | Freq: Two times a day (BID) | ORAL | 0 refills | Status: AC

## 2018-07-30 MED ORDER — FENTANYL 50 MCG/HR TD PT72: 1.0000 | MEDICATED_PATCH | TRANSDERMAL | 0 refills | Status: DC

## 2018-07-30 MED ORDER — DULOXETINE HCL 60 MG PO CPEP: 60.0000 mg | ORAL_CAPSULE | Freq: Every day | ORAL | 0 refills | Status: AC

## 2018-07-30 MED ORDER — DRONABINOL 2.5 MG PO CAPS
2.5000 mg | ORAL_CAPSULE | Freq: Two times a day (BID) | ORAL | Status: DC
  Administered 2018-07-30: 2.5 mg via ORAL
  Filled 2018-07-30: qty 1

## 2018-07-30 MED ORDER — TAMSULOSIN HCL 0.4 MG PO CAPS: 0.4000 mg | ORAL_CAPSULE | Freq: Every day | ORAL | 0 refills | Status: AC

## 2018-07-30 MED ORDER — DRONABINOL 2.5 MG PO CAPS: 2.5000 mg | ORAL_CAPSULE | Freq: Two times a day (BID) | ORAL | 0 refills | Status: AC

## 2018-07-30 MED ORDER — AMOXICILLIN 500 MG PO CAPS
500.0000 mg | ORAL_CAPSULE | Freq: Three times a day (TID) | ORAL | Status: DC
  Administered 2018-07-30: 500 mg via ORAL
  Filled 2018-07-30 (×2): qty 1

## 2018-07-30 MED ORDER — DIPHENOXYLATE-ATROPINE 2.5-0.025 MG PO TABS
1.0000 | ORAL_TABLET | Freq: Four times a day (QID) | ORAL | Status: DC | PRN
  Administered 2018-07-30: 1 via ORAL
  Filled 2018-07-30: qty 1

## 2018-07-30 MED ORDER — PRO-STAT SUGAR FREE PO LIQD: 30.0000 mL | Freq: Three times a day (TID) | ORAL | 0 refills | Status: AC

## 2018-07-30 MED ORDER — CLOBETASOL PROPIONATE 0.05 % EX OINT: TOPICAL_OINTMENT | Freq: Two times a day (BID) | CUTANEOUS | 0 refills | Status: AC

## 2018-07-30 MED ORDER — METHOCARBAMOL 750 MG PO TABS: 750.0000 mg | ORAL_TABLET | Freq: Four times a day (QID) | ORAL | 0 refills | Status: AC

## 2018-07-30 MED ORDER — PANTOPRAZOLE SODIUM 40 MG PO TBEC: 40.0000 mg | DELAYED_RELEASE_TABLET | Freq: Every day | ORAL | 0 refills | Status: AC

## 2018-07-30 MED ORDER — POTASSIUM CHLORIDE CRYS ER 15 MEQ PO TBCR: 30.0000 meq | EXTENDED_RELEASE_TABLET | Freq: Two times a day (BID) | ORAL | 0 refills | Status: AC

## 2018-07-30 MED ORDER — FUROSEMIDE 20 MG PO TABS: 20.0000 mg | ORAL_TABLET | Freq: Two times a day (BID) | ORAL | 0 refills | Status: AC

## 2018-07-30 MED ORDER — GABAPENTIN 300 MG PO CAPS: 300.0000 mg | ORAL_CAPSULE | Freq: Three times a day (TID) | ORAL | 0 refills | Status: AC

## 2018-07-30 MED ORDER — HYDROCODONE-ACETAMINOPHEN 7.5-325 MG PO TABS
1.0000 | ORAL_TABLET | ORAL | Status: DC | PRN
  Administered 2018-07-30: 1 via ORAL
  Filled 2018-07-30: qty 1

## 2018-07-30 NOTE — Progress Notes (Signed)
Social Work  Discharge Note  The overall goal for the admission was met for:   Discharge location: [place]  Length of Stay: Yes-30 DAYS  Discharge activity level: No-MOD/MAX LEVEL  Home/community participation: Yes  Services provided included: MD, RD, PT, OT, RN, CM, Pharmacy, Neuropsych and SW  Financial Services: Medicare and Private Insurance: [place]  Follow-up services arranged: Other: [facility] ALL EQUIPMENT DELIVERED TO THE HOME 2/6  Comments (or additional information):WIFE HAS HIRED ASSIST THROUGH [facility] AND HAS HOSPICE IN PLACE. PT'S MAIN GOAL IS TO GET HOME AND ENJOY HIS CATS. HOME VIA AMBULANCE. COUSIN HAS BEEN TRAINED IN HIS CARE, WIFE HAS OBSERVED MORE THAN PARTICIPATED.   Patient/Family verbalized understanding of follow-up arrangements: Yes  Individual responsible for coordination of the follow-up plan: CINDY-WIFE  Confirmed correct DME delivered: Elease Hashimoto 07/30/2018    Elease Hashimoto

## 2018-07-30 NOTE — Progress Notes (Signed)
>>  2 hours spent in total in counseling and coordination of care regarding counseling and coordination of care regarding discharge planning.  Please see several notes from several providers.  Will transition to oral abx prior at discharge, this is agreeable to wife and permissible with hospice.  Unfortunately poor prognosis.  Informed patient and family that we would be available if needed to answer any questions.

## 2018-07-30 NOTE — Discharge Summary (Addendum)
Physician Discharge Summary  Patient ID: Brian [name] MRN: [id number] DOB/AGE: 1945/12/06 73 y.o.  Admit date: 06/29/2018 Discharge date: 07/30/2018  Discharge Diagnoses:  Principal Problem:   Pathological compression fracture of lumbar vertebra [facility]) Active Problems:   Hypokalemia   Metastatic lung cancer (metastasis from lung to other site) [facility])   DVT of lower extremity (deep venous thrombosis) (HCC)   Hypoalbuminemia due to protein-calorie malnutrition (HCC)   Acute blood loss anemia   Postoperative pain   Cancer associated pain   Orthostatic hypotension   Leukocytosis   Sinus tachycardia   Peripheral edema   Pain aggravated by physical activity   Discharged Condition: poor  Significant Diagnostic Studies: Dg Chest 1 View  Result Date: 07/08/2018 CLINICAL DATA:  Fever, posterior RIGHT hip pain, BILATERAL shoulder pain and shortness of breath for 16 weeks EXAM: CHEST  1 VIEW COMPARISON:  07/05/2018 FINDINGS: Upper normal heart size. Normal mediastinal contours and pulmonary vascularity. Interstitial infiltrates in the mid to lower lungs new since previous study. Prominent hila. RIGHT infrahilar density corresponding to mass on previous exam. Upper lungs clear. Bones demineralized. IMPRESSION: RIGHT lower lobe mass as seen previously. Interstitial infiltrates in the mid to lower lungs which could represent infection or edema. Electronically Signed   By: Lavonia Dana M.D.   On: 07/08/2018 20:06   Dg Chest 2 View  Result Date: 07/05/2018 CLINICAL DATA:  Chest congestion, cough, leukocytosis EXAM: CHEST - 2 VIEW COMPARISON:  06/13/2018 FINDINGS: Rounded right lower lobe masses again noted as seen on prior chest CT. Mild cardiomegaly. Linear scarring in the left base. No effusions or acute bony abnormality. IMPRESSION: Right lower lobe mass again noted. Mild cardiomegaly. Electronically Signed   By: Rolm Baptise M.D.   On: 07/05/2018 17:02   Dg Lumbar Spine Complete  Result  Date: 07/07/2018 CLINICAL DATA:  Worsening low back pain. Recent decompressive lumbar laminectomy at L4-L5. EXAM: LUMBAR SPINE - COMPLETE 4+ VIEW COMPARISON:  CT lumbar spine dated June 23, 2018. FINDINGS: Five lumbar type vertebral bodies. Prior L3-L5 posterior fusion and L4 corpectomy. No evidence of hardware failure or loosening. No acute fracture or subluxation. Vertebral body heights are preserved. Alignment is normal. Mild disc height loss at L5-S1 is unchanged. Remaining intervertebral disc spaces are maintained. Interval placement of an IVC filter. Unchanged infrarenal abdominal aortic aneurysm. Unchanged cholelithiasis. IMPRESSION: 1.  No acute osseous abnormality. 2. Prior L3-L5 posterior fusion and L4 corpectomy without hardware complication. 3. Unchanged mild degenerative disc disease at L5-S1. Electronically Signed   By: Titus Dubin M.D.   On: 07/07/2018 10:35   Dg Shoulder Right  Result Date: 07/15/2018 CLINICAL DATA:  73 year old male with a history right shoulder pain no known injury EXAM: RIGHT SHOULDER - 2+ VIEW COMPARISON:  None. FINDINGS: Questionable linear lucency of the coracoid. Glenohumeral joint appears congruent.  Degenerative changes. Degenerative changes of the acromioclavicular joint. Glenohumeral joint appears congruent. Right upper extremity PICC is partially imaged. IMPRESSION: Questionable linear lucency of the coracoid, which would be atypical location for fracture without history of trauma. Correlation with point tenderness at the coracoid may be useful. Degenerative changes of the acromioclavicular joint and glenohumeral joint. Partially imaged right upper extremity PICC Electronically Signed   By: Corrie Mckusick D.O.   On: 07/15/2018 15:43   Ct Chest W Contrast  Result Date: 07/22/2018 CLINICAL DATA:  Non-small cell lung cancer. EXAM: CT CHEST WITH CONTRAST TECHNIQUE: Multidetector CT imaging of the chest was performed during intravenous contrast administration.  CONTRAST:  29mL OMNIPAQUE IOHEXOL 300 MG/ML  SOLN COMPARISON:  06/04/2018 FINDINGS: Cardiovascular: The heart is within normal limits in size. No pericardial effusion. There is tortuosity and calcification of the thoracic aorta but no dissection or focal aneurysm. Advanced three-vessel coronary artery calcifications are noted. Mediastinum/Nodes: Stable sized mediastinal and right hilar lymph nodes. Lower right paratracheal node on image number 59 measures 14 mm and previously measured 13 mm. Pretracheal lymph node on image number 54 measures 8 mm and is stable. Small AP window lymph nodes are stable. 15 mm right hilar node is unchanged. Lungs/Pleura: Large right lower lobe lung mass measures approximately 7.6 by 10.1 cm and previously measured 8.1 x 5.1 cm. 6 mm right lower lobe pulmonary nodule on image number 87 appears to be new. 4.5 mm subpleural nodule on image number 92 is stable. 8 mm nodule along the left major fissure on image number 50 previously measured 3.5 mm. Two small adjacent pulmonary nodules in the left upper lobe on images 65 and 69 are new. New 8 mm right upper lobe nodule posteriorly on image number 35. This has a vessel going right into it and could be rounded atelectasis. New 4.5 mm right upper lobe nodule on image number 35. Stable advanced emphysematous changes and pulmonary scarring. Upper Abdomen: Enlarging left adrenal gland lesion consistent with a metastatic focus. Maximum transverse diameter is 3 cm and was previously 2 cm. There is a large lesion involving the left hepatic lobe which I do not see on the prior study. It is hard to believe that this is a metastasis given its size (8.4 x 6.3 cm). I think it is much more likely a hepatic infarct. Although these are rare I do not see the left portal vein and it is likely thrombosed. There is a faint lesion in segment 4B which could be a metastasis. The right and middle portal veins are patent. Musculoskeletal: The destructive bone lesion  involving the clavicle has enlarged. There is progressive destructive bony changes. This measures approximately 4.6 x 4.2 cm and previously measured 2.6 x 2.3 cm. It also appears to be infiltrating the left pectoralis major muscle now. The right eighth rib lesion is also progressive. The rib is completely destroyed. The soft tissue component measures 6.8 x 3.8 cm and previously measured 7.1 x 3.3 cm. There is a new or progressive destructive lesion involving the anterior third rib with a small soft tissue mass measuring 15 mm. Tumor is also invading the right ninth posterior rib and this appears slightly progressive. IMPRESSION: 1. Fairly progressive disease since the prior CT scan from 6 weeks ago. The right lower lobe lung mass has enlarged. There are new and progressive pulmonary metastasis. The bone metastasis have significantly progressed as detailed above. 2. Large left hepatic lobe lesion is most likely a hepatic infarct from left portal vein thrombosis. There is a suspicious lesion in segment 4B which is likely a metastasis. 3. Enlarging left adrenal gland metastasis. 4. Overall stable mediastinal and right hilar lymph nodes. 5. Stable advanced three-vessel coronary artery calcifications. Aortic Atherosclerosis (ICD10-I70.0) and Emphysema (ICD10-J43.9). Electronically Signed   By: Marijo Sanes M.D.   On: 07/22/2018 19:55   Ct Lumbar Spine Wo Contrast  Result Date: 07/29/2018 CLINICAL DATA:  73 year old male with history of metastatic lung cancer, status post lateral L4 corpectomy with L3-5 instrumentation as well as posterior L4-5 decompression. Course complicated by surgical wound infection, status post recent washout and debridement. Evaluate hardware, right psoas hematoma, and possible  locally recurrent disease. EXAM: CT LUMBAR SPINE WITHOUT CONTRAST TECHNIQUE: Multidetector CT imaging of the lumbar spine was performed without intravenous contrast administration. Multiplanar CT image reconstructions  were also generated. COMPARISON:  Prior CT from 06/23/2018. FINDINGS: Segmentation: Standard. Lowest well-formed disc labeled the L5-S1 level. Alignment: Mild diffuse dextroscoliosis. Trace 2 mm retrolisthesis of L2 on L3 also unchanged. No interval malalignment or subluxation. Vertebrae: Again seen is prior L4 corpectomy with posterior L3 through L5 intra mentation. Hardware is stable in position and alignment and remains intact. No periprosthetic lucency to suggest loosening or failure. There has been progressive erosive changes with lucency seen within the surrounding L4 vertebral body with decreased intact cortex as compared to previous exam, suspicious for possible persistent and/or recurrent metastasis. This is most notable within the left pedicle of L4 (series 4, image 76). Delineation of any potential discrete margins fairly limited on this noncontrast examination. Paraspinal and other soft tissues: Extensive soft tissue stranding with few scattered foci of emphysema seen involving the posterior paraspinous soft tissues at the level of the L4 decompression, most likely related to recent debridement and washout. No definite discrete collections identified within this region. Previously seen bilateral psoas hematomas, right greater than left are partially visualized, and overall decreased in size as compared to previous exam, now subacute in appearance. Discrete ovoid collection at the inferior margin of the right psoas muscle measuring 1.5 x 1.9 cm most compatible with subacute hematoma (series 5, image 115). No visible new hemorrhage. Aortic atherosclerosis with intra-abdominal aneurysm, partially visualized, grossly similar to previous. IVC filter noted. Disc levels: Remainder of the lumbar spine relatively stable in appearance without new or progressive findings elsewhere. IMPRESSION: 1. Postoperative changes from recent posterior lumbar wound debridement and washout without complication. 2. Interval  decrease in bilateral psoas hematomas, right greater than left. These are now smaller in size and subacute in appearance. No evidence for new hemorrhage. 3. Stable position and alignment of L4 corpectomy with L3-L5 instrumentation. No hardware complication. 4. Progressive lucency with osseous erosion about the remaining L4 vertebral body and posterior elements, concerning for progressive malignancy. Finding could be further assessed with dedicated MRI as clinically warranted. Electronically Signed   By: Jeannine Boga M.D.   On: 07/29/2018 05:42   Ct Biopsy  Result Date: 07/26/2018 INDICATION: History of metastatic lung cancer, now with infiltrative lytic mass involving the lateral aspect of the right eighth rib. Please perform image guided biopsy to obtain additional tissue for molecular testing. EXAM: CT AND ULTRASOUND-GUIDED BIOPSY OF LYTIC MASS INVOLVING THE LATERAL ASPECT THE RIGHT EIGHTH RIB COMPARISON:  Chest CT-07/22/2018 MEDICATIONS: None ANESTHESIA/SEDATION: Moderate (conscious) sedation was employed during this procedure. A total of Versed 1 mg and Fentanyl 25 mcg was administered intravenously. Moderate Sedation Time: 14 minutes. The patient's level of consciousness and vital signs were monitored continuously by radiology nursing throughout the procedure under my direct supervision. COMPLICATIONS: None immediate. TECHNIQUE: Informed written consent was obtained from the patient after a discussion of the risks, benefits and alternatives to treatment. Questions regarding the procedure were encouraged and answered. Patient was positioned left lateral decubitus on the CT gantry with CT imaging demonstrating unchanged size and appearance of the approximately 7.5 x 3.2 cm lytic mass destroying the lateral aspect of the right eighth rib (image 70, series 2). The skin site was marked. Ultrasound scanning was able to adequately visualize the lytic lesion The skin overlying the lesion was prepped and  draped in usual sterile fashion. Under direct ultrasound guidance,  an 18 gauge coaxial needle was advanced into the lytic lesion. Ultrasound image was saved for procedural documentation purposes. Appropriate positioning was confirmed with CT imaging. Next, under direct ultrasound guidance, 6 core needle biopsies was obtained of the lytic lesion. The coaxial needle was removed and postprocedural imaging was obtained was negative for hematoma or pneumothorax. A dressing was placed. The patient tolerated the procedure well without immediate postprocedural complication. IMPRESSION: Technically successful ultrasound and CT-guided biopsy of infiltrative lytic lesion involving the lateral aspect of the right eighth rib. Electronically Signed   By: Sandi Mariscal M.D.   On: 07/26/2018 16:20   Dg Hip Unilat With Pelvis 2-3 Views Right  Result Date: 07/08/2018 CLINICAL DATA:  Right posterior hip pain. No known injury. History of metastatic lung cancer. EXAM: DG HIP (WITH OR WITHOUT PELVIS) 2-3V RIGHT COMPARISON:  04/13/2018. FINDINGS: Minimal right femoral head and neck junction spur formation. Mild joint space narrowing. No fracture or dislocation. No visible bone metastases. Lower lumbar spine fixation hardware. Atheromatous arterial calcifications. IMPRESSION: 1. Minimal right hip degenerative changes. 2. Lower lumbar spine fixation hardware. Electronically Signed   By: Claudie Revering M.D.   On: 07/08/2018 20:03   [place] Ekg Site Rite  Result Date: 07/13/2018 If Site Rite image not attached, placement could not be confirmed due to current cardiac rhythm.   Labs:  Basic Metabolic Panel: BMP Latest Ref Rng & Units 07/28/2018 07/26/2018 07/23/2018  Glucose 70 - 99 mg/dL 117(H) 111(H) 93  BUN 8 - 23 mg/dL 12 13 13   Creatinine 0.61 - 1.24 mg/dL 0.62 0.57(L) 0.55(L)  Sodium 135 - 145 mmol/L 142 142 138  Potassium 3.5 - 5.1 mmol/L 3.3(L) 3.5 3.8  Chloride 98 - 111 mmol/L 106 104 101  CO2 22 - 32 mmol/L 27 26 28   Calcium  8.9 - 10.3 mg/dL 7.4(L) 7.2(L) 8.1(L)    CBC: CBC Latest Ref Rng & Units 07/28/2018 07/26/2018 07/23/2018  WBC 4.0 - 10.5 K/uL 21.5(H) 17.0(H) 19.5(H)  Hemoglobin 13.0 - 17.0 g/dL 9.9(L) 8.9(L) 8.8(L)  Hematocrit 39.0 - 52.0 % 35.0(L) 30.9(L) 30.8(L)  Platelets 150 - 400 K/uL 436(H) 349 351    CBG: No results for input(s): GLUCAP in the last 168 hours.  Brief HPI:   FREEMON BINFORD is a 73 year old male with history of RA,emphyema, tobacco abuse, low back and hip pain for months with work up revealing bronchogenic RLL lung cancer with bony mets to left clavicle, R-8th rib, L4 vertebral body with fracture and 2.5 cm liver lesion. He was admitted from office for work up on 12020/05/918 and he underwent L4 corpectomy with L3-L5 fusion by Dr. Rolena Infante on 06/11/18.  Perioperatively he had blood loss anemia with hematoma at corpectomy site requiring 4 units PRBC and FFP.  Surgery was stopped due to bleeding and CT abdomen pelvis done revealing small retroperitoneal hematoma extending to psoas muscle.  IR felt that there was no need for embolization of vessel due to lack of expanding hematoma. Once stabilized, he was taken back to the OR on 06/13/18 for screw fixation L3-L5.  Pathology revealed squamous cell lung cancer and plans are for HRT in future.  He continued to have issues with severe pain as well as bilateral lower extremity weakness and follow-up MRI spine 06/18/18 revealed retroperitoneal hematoma and unchanged severe spinal canal stenosis at L4 level.  He required multiple units PRBC as well as IV iron for ABLA.  Follow-up CT lumbar spine 06/23/18 revealed no change in large right  and small left retroperitoneal hematomas expanding psoas muscle.  He has also had issues with rash and itching felt to be drug related.  Palliative care has been following to help with pain management as well as discuss goals of care.  He was taken back to the OR on 06/24/18 L4-L5 decompression with improvement in neuropathic pain  RLE.  Therapy was ongoing and he was showing improvement in mobility.  CIR was recommended due to functional deficits   Hospital Course: Erice Ahles was admitted to rehab 06/29/2018 for inpatient therapies to consist of PT and OT at least three hours five days a week. Past admission physiatrist, therapy team and rehab RN have worked together to provide customized collaborative inpatient rehab. Dopplers done past admission on revealed acute DVT in Left CFV. He was not felt to a candidate for anticoagulation due hematomas as well as high risk of bleeding therefore IVC filter was placed on 06/30/18.  As activity increased, he had worsening of back pain, right hip pain and shoulder pain as well as neuropathy RLE. He initially refused to have medications added and required frequent education as well as rationale regarding medication regimen has been ongoing during his hospitalization.  Fentanyl patch was added and titrated to 49mcg. Hydrocodone 7.5/325 mg has been used for breakthrough pain. Family has preferred not to use IV dilaudid due to sedative SE.  Gabapentin was titrated to 300 mg tid for neuropathic pain. Bowel program has been adjusted during his stay to help manage constipation and flomax was added to help manage hesitancy.  His po intake has been continue to fluctuate in part due to mood and his  family has been providing supplements as well as food from home due to  Patient's dislike of hospital food. Lasix was added to help manage peripheral edema.  Ego support has been provided by team as well as palliative care and cymbalta was added to help with mood stabilization. Due to deconditioning and orthostatic changes, abdominal binder was added to help control symptoms. He developed fever with wound drainage on 1/16 and Dr. Rolena Infante was consulted for input. Blood cultures X 2 done and he was started on Keflex. He was taken to OR for I and D with placement of antibiotic beads as well as application of  wound VAC on 1/17.  Wound cultures done in OR grew out E coli and antibiotics changed to cefazolin per ID input. Serial CBC showed persistent leucocytosis.  due to poorly healing wound he required repeat I and D on 1/29 and ultimately repeat I and D with wound closure on 2/5. Cefazolin was changed to Rocephin on 1/31 but patient started developing diarrhea which persisted even after laxatives were discontinued x2 days. Diarrhea felt to be due to Ceftriaxone and patient's family has been educated to monitor for signs of C diff colitis. Hospice also to follow up.  Dr. Marin Olp has been following for input and follow up CT scan done 1/30 showed increase in size of lung mass with large left hepatic mass. He recommended biopsy for work up as he felt that patient maybe candidate for immunotherapy as well as recommended starting XRT to left clavicular lesion. Patient underwent CT guided biopsy of lytic lesion of right 8th rib on 2/3. Pathology revealed poorly differentiated squamous cancer with necrosis.   In the past week, patient has refused multiple medications and has had decline in po intake again. Lytes have been monitored with serial checks and Kdur was increased 30 meq bid  due to hypokalemia. Magnesium supplement was discontinued on 2/5 ad hypomagnesemia has resolved.  Serial CBC showed persistent leucocytosis without fevers. He has had increase in weakness with decline in activity tolerance as well as progression of cancer with poor prognosis, family decided to transition to Hospice. Antibiotics were transitioned to amoxicillin 500 mg tid X 2 weeks per ID input.  He was discharged to home on 07/30/18 with Hospice follow up.    Rehab course: During patient's stay in rehab weekly team conferences were held to monitor patient's progress, set goals and discuss barriers to discharge. At admission, patient required max assist for basic self care tasks and max assist with mobility. Due to progression of cancer,  he has not been able to meet set goals. At discharge, he required total assist with ADL tasks and mod assist for sliding board transfers.  at wheelchair level. Family has been educated regarding care and plans to hire personal caregivers after discharge.   Disposition:  Home   Diet: Regular.   Special Instructions: 1. Continue Amoxicillin 500 mg tid X 2 weeks.  2. Monitor for worsening of diarrhea and signs of C diff. 3. Cleanse incision with normal saline and cover honeycomb dressing with dry dressing. Change daily.   Discharge Instructions    Incentive spirometry RT   Complete by:  As directed      Allergies as of 07/30/2018      Reactions   Oxycodone    Severe N/V   Cefazolin Rash   Tolerates ceftriaxone fine   Minoxidil Palpitations   Nicotine Palpitations   Sulfamethoxazole Other (See Comments)   Both parents allergic, avoids med      Medication List    STOP taking these medications   acetaminophen 650 MG suppository Commonly known as:  TYLENOL Replaced by:  acetaminophen 325 MG tablet   albuterol (2.5 MG/3ML) 0.083% nebulizer solution Commonly known as:  PROVENTIL   bisacodyl 10 MG suppository Commonly known as:  DULCOLAX   feeding supplement (ENSURE ENLIVE) Liqd   fentaNYL 12 MCG/HR Commonly known as:  DURAGESIC Replaced by:  fentaNYL 50 MCG/HR   HYDROmorphone HCl 1 MG/ML Liqd Commonly known as:  DILAUDID   polyethylene glycol packet Commonly known as:  MIRALAX / GLYCOLAX     TAKE these medications   acetaminophen 325 MG tablet Commonly known as:  TYLENOL Take 1-2 tablets (325-650 mg total) by mouth every 4 (four) hours as needed for mild pain. Replaces:  acetaminophen 650 MG suppository   amoxicillin 500 MG capsule Commonly known as:  AMOXIL Take 1 capsule (500 mg total) by mouth every 8 (eight) hours.   camphor-menthol lotion Commonly known as:  SARNA Apply topically as needed for itching. Notes to patient:  Over the counter   carvedilol  6.25 MG tablet Commonly known as:  COREG Take 1 tablet (6.25 mg total) by mouth 2 (two) times daily with a meal.   clobetasol ointment 0.05 % Commonly known as:  TEMOVATE Apply topically 2 (two) times daily. Notes to patient:  Use as needed if itching recurs.    diphenhydrAMINE 25 mg capsule Commonly known as:  BENADRYL Take 1 capsule (25 mg total) by mouth at bedtime as needed for itching or sleep.   diphenoxylate-atropine 2.5-0.025 MG tablet Commonly known as:  LOMOTIL Take 1 tablet by mouth 4 (four) times daily as needed for diarrhea or loose stools. Notes to patient:  Can increase to two tabs four times a day if one tablet is ineffective.  dronabinol 2.5 MG capsule Commonly known as:  MARINOL Take 1 capsule (2.5 mg total) by mouth 2 (two) times daily before lunch and supper.   DULoxetine 60 MG capsule Commonly known as:  CYMBALTA Take 1 capsule (60 mg total) by mouth daily. Start taking on:  July 31, 2018   feeding supplement (PRO-STAT SUGAR FREE 64) Liqd Take 30 mLs by mouth 4 (four) times daily -  with meals and at bedtime.   fentaNYL 50 MCG/HR Commonly known as:  [place] 1 patch onto the skin every 3 (three) days. Patch applied on Friday 2/7 after lunch Replaces:  fentaNYL 12 MCG/HR Notes to patient:  Changed every 3 days--long acting medication.    furosemide 20 MG tablet Commonly known as:  LASIX Take 1 tablet (20 mg total) by mouth 2 (two) times daily.   gabapentin 300 MG capsule Commonly known as:  NEURONTIN Take 1 capsule (300 mg total) by mouth 3 (three) times daily.   guaiFENesin 100 MG/5ML Soln Commonly known as:  ROBITUSSIN Take 10 mLs (200 mg total) by mouth every 6 (six) hours.   hydrocerin Crea Apply 1 application topically 2 (two) times daily.   HYDROcodone-acetaminophen 7.5-325 MG tablet--Rx# 10 pills Commonly known as:  NORCO Take 1 tablet by mouth every 4 (four) hours as needed for severe pain. What changed:  when to take  this   hydroxypropyl methylcellulose / hypromellose 2.5 % ophthalmic solution Commonly known as:  ISOPTO TEARS / GONIOVISC Place 1-2 drops into both eyes as needed for dry eyes.   menthol-cetylpyridinium 3 MG lozenge Commonly known as:  CEPACOL Take 1 lozenge (3 mg total) by mouth as needed for sore throat (sore throat).   methocarbamol 750 MG tablet Commonly known as:  ROBAXIN Take 1 tablet (750 mg total) by mouth 4 (four) times daily. What changed:    medication strength  how much to take  when to take this  reasons to take this   pantoprazole 40 MG tablet Commonly known as:  PROTONIX Take 1 tablet (40 mg total) by mouth daily.   potassium chloride SA 15 MEQ tablet Commonly known as:  KLOR-CON M15 Take 2 tablets (30 mEq total) by mouth 2 (two) times daily.   tamsulosin 0.4 MG Caps capsule Commonly known as:  FLOMAX Take 1 capsule (0.4 mg total) by mouth daily after supper.      Follow-up Information    Jamse Arn, MD Follow up.   Specialty:  Physical Medicine and Rehabilitation Why:  as needed Contact information: [address] [place] [address] [phone]        Volanda Napoleon, MD. Call.   Specialty:  Oncology Why:  as needed Contact information: [address] [place] [address] [phone]        Melina Schools, MD Follow up.   Specialty:  Orthopedic Surgery Contact information: [address] [place] [address] [phone]           Signed: Bary Leriche 07/30/2018, 11:09 PM Patient seen and examined by me on day of discharge. Delice Lesch, MD, ABPMR

## 2018-07-30 NOTE — Progress Notes (Addendum)
Patient has been on cefazolin during most of his stay with minimal issues with itching--it was much better compaired to rash that he had in ICU. Ceftriaxone is causing diarrhea with skin breakdown and anorexia as patient now afraid to eat. Will transition to amoxicillin. Lomotil for diarrhea--5 episodes this am. No odor or signs of infection but has been on prolonge antibiotics. Will check C diff with WBC trending up.

## 2018-07-30 NOTE — Discharge Instructions (Signed)
Inpatient Rehab Discharge Instructions  Talon Regala Discharge date and time:  07/30/18  Activities/Precautions/ Functional Status: Activity: no lifting, driving, or strenuous exercise till cleared by MD Diet: regular diet Wound Care: Cleanse with normal saline. Keep wound clean and dry. Contact Dr. Rolena Infante if you develop any problems with your incision/wound--redness, swelling, increase in pain, drainage or if you develop fever or chills.    Functional status:  ___ No restrictions     ___ Walk up steps independently _X__ 24/7 supervision/assistance   ___ Walk up steps with assistance ___ Intermittent supervision/assistance  ___ Bathe/dress independently ___ Walk with walker     _X__ Bathe/dress with assistance ___ Walk Independently    ___ Shower independently ___ Walk with assistance    ___ Shower with assistance _X__ No alcohol     ___ Return to work/school ________    Special Instructions: 1. Toilet patient every 4 hours. 2. Offer fluids/snacks thorough out the day. Marinol will help with appetite (monitor for side effects)   COMMUNITY REFERRALS UPON DISCHARGE:        Agency:[facility] ([facility]  Phone:[phone]   Date of last service:07/30/2018  Medical Equipment/Items Ordered:HOSPITAL BED, LOW AIRLOSS MATTRESS, WHEELCHAIR, BEDSIDE COMMODE, 30 TRANSFER BOARD, HOME O2 AND SUCTION AGENCY: [facility]   [phone]   [facility] [place] TRANSPORT HOME  GENERAL COMMUNITY RESOURCES FOR PATIENT/FAMILY: Caregiver Support:HOSPICE SUPPORT [employer]: April FRANKLIN [phone]  My questions have been answered and I understand these instructions. I will adhere to these goals and the provided educational materials after my discharge from the hospital.  Patient/Caregiver Signature _______________________________ Date __________  Clinician Signature _______________________________________ Date __________  Please bring this form and your  medication list with you to all your follow-up doctor's appointments.

## 2018-07-30 NOTE — Progress Notes (Signed)
Received consult to heparinize PICC for discharge, family mentioned pt may not go home with PICC. Discussed with Deidre Ala, RN: She will re-enter consult when plan is finalized. PICC flushed with saline only and capped.

## 2018-07-30 NOTE — Progress Notes (Signed)
I spent an hour and a half this morning talking with [name] and his daughter.  His daughter came in from [place].  They told me that he was going home today.  This does surprise me.  I truly hope that hospice has notified his wife and that they will be on board when he goes home.  His daughter is still having a hard time with his cancer.  She still feels that he needs to be treated.  I try to tell her that he just is in no condition for chemotherapy.  He is in no condition for transfer to a academic [facility] as they would not treat him given his poor performance status (ECOG 3-4).  I did send off his biopsy.  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  I also sent the biopsy off for molecular profiling.  I think the only chance that we have to help him as if there is a genetic mutation that we might be able to target.  The chance of this happening would be less than 5%.  He has a very low prealbumin.  I told her that he is not eating.  We have tried to get him to eat.  We tried a encouraged him to eat.  We have tried him on Marinol before.  He would not take it.  She wants to try Marinol again.  She is interested in naturopathic approaches.  Again, I have no problems with this.  I will try to find the name of a holistic physician who might be willing to help.  He is not really getting out of bed.  He is not walking.  He continues to decline overall.  The fact that he had this infection in the surgical site really put him behind.  This really has prevented [place] from trying to treat him.  I explained that radiation, if this was done, would not prolong his life but might help with preventing a fracture of the clavicle.  I know radiation oncology has seen him.  I am not sure that they are willing to treat him.  In private, I told his daughter that I really did not think that he would make it more than 3-4 weeks.  I suspect that his cancer will continue to  weaken him and that he likely would end up having a postobstructive pneumonia that would not be treatable.  Again, she is having a hard time with this.  She just feels like something could be done to help him.  I told her that my goal has always been his quality of life.  I just want him to have respect and dignity.  I feel that we need to focus on his pain issues.  He was initially admitted because of severe and constant lower back pain with bilateral leg radiation due to the fracture at L4 because of the tumor involvement.  I thought that by doing a corpectomy and spinal fusion that we would be able to get him more mobile and allow him to have more independence and then be able to treat him.  Unfortunately, this did not end up being the case as he developed this infection and this is just been very difficult to eradicate.  I know that he wants to be home.  Hospice will do a great job helping out at home.  However, if there is a problem at home and [name] cannot get the care that he needs  for his comfort, then [facility] would be available.  I really have held bad for [name].  I know that he is tried as much as he could possibly try.  He is given [place] all that his body would allow.  I do feel bad for his daughter as I know that she lives in [place] and wants to be here for him.  Again, if he does go home today, it looks like hospice will be ready to step in and help out.   Lattie Haw, MD  1 Corinthians 1:10

## 2018-07-30 NOTE — Progress Notes (Signed)
Patient discharged with family. No questions or complaints at this time. All belongings accounted for.

## 2018-07-31 DIAGNOSIS — C7951 Secondary malignant neoplasm of bone: Secondary | ICD-10-CM | POA: Diagnosis not present

## 2018-07-31 DIAGNOSIS — C787 Secondary malignant neoplasm of liver and intrahepatic bile duct: Secondary | ICD-10-CM | POA: Diagnosis not present

## 2018-07-31 DIAGNOSIS — C7940 Secondary malignant neoplasm of unspecified part of nervous system: Secondary | ICD-10-CM | POA: Diagnosis not present

## 2018-07-31 DIAGNOSIS — D6869 Other thrombophilia: Secondary | ICD-10-CM | POA: Diagnosis not present

## 2018-07-31 DIAGNOSIS — K219 Gastro-esophageal reflux disease without esophagitis: Secondary | ICD-10-CM | POA: Diagnosis not present

## 2018-07-31 DIAGNOSIS — I82409 Acute embolism and thrombosis of unspecified deep veins of unspecified lower extremity: Secondary | ICD-10-CM | POA: Diagnosis not present

## 2018-07-31 DIAGNOSIS — C799 Secondary malignant neoplasm of unspecified site: Secondary | ICD-10-CM | POA: Diagnosis not present

## 2018-07-31 DIAGNOSIS — D630 Anemia in neoplastic disease: Secondary | ICD-10-CM | POA: Diagnosis not present

## 2018-07-31 DIAGNOSIS — C3490 Malignant neoplasm of unspecified part of unspecified bronchus or lung: Secondary | ICD-10-CM | POA: Diagnosis not present

## 2018-07-31 DIAGNOSIS — N401 Enlarged prostate with lower urinary tract symptoms: Secondary | ICD-10-CM | POA: Diagnosis not present

## 2018-08-01 DIAGNOSIS — C3490 Malignant neoplasm of unspecified part of unspecified bronchus or lung: Secondary | ICD-10-CM | POA: Diagnosis not present

## 2018-08-01 DIAGNOSIS — C799 Secondary malignant neoplasm of unspecified site: Secondary | ICD-10-CM | POA: Diagnosis not present

## 2018-08-01 DIAGNOSIS — D6869 Other thrombophilia: Secondary | ICD-10-CM | POA: Diagnosis not present

## 2018-08-01 DIAGNOSIS — C7940 Secondary malignant neoplasm of unspecified part of nervous system: Secondary | ICD-10-CM | POA: Diagnosis not present

## 2018-08-01 DIAGNOSIS — C7951 Secondary malignant neoplasm of bone: Secondary | ICD-10-CM | POA: Diagnosis not present

## 2018-08-01 DIAGNOSIS — C787 Secondary malignant neoplasm of liver and intrahepatic bile duct: Secondary | ICD-10-CM | POA: Diagnosis not present

## 2018-08-02 ENCOUNTER — Other Ambulatory Visit: Payer: Self-pay | Admitting: *Deleted

## 2018-08-02 DIAGNOSIS — C7801 Secondary malignant neoplasm of right lung: Secondary | ICD-10-CM | POA: Diagnosis not present

## 2018-08-02 DIAGNOSIS — C787 Secondary malignant neoplasm of liver and intrahepatic bile duct: Secondary | ICD-10-CM | POA: Diagnosis not present

## 2018-08-02 DIAGNOSIS — C7940 Secondary malignant neoplasm of unspecified part of nervous system: Secondary | ICD-10-CM | POA: Diagnosis not present

## 2018-08-02 DIAGNOSIS — C3490 Malignant neoplasm of unspecified part of unspecified bronchus or lung: Secondary | ICD-10-CM | POA: Diagnosis not present

## 2018-08-02 DIAGNOSIS — D6869 Other thrombophilia: Secondary | ICD-10-CM | POA: Diagnosis not present

## 2018-08-02 DIAGNOSIS — G893 Neoplasm related pain (acute) (chronic): Secondary | ICD-10-CM

## 2018-08-02 DIAGNOSIS — C7951 Secondary malignant neoplasm of bone: Secondary | ICD-10-CM | POA: Diagnosis not present

## 2018-08-02 DIAGNOSIS — C799 Secondary malignant neoplasm of unspecified site: Secondary | ICD-10-CM | POA: Diagnosis not present

## 2018-08-02 MED ORDER — FENTANYL 50 MCG/HR TD PT72: 1.0000 | MEDICATED_PATCH | TRANSDERMAL | 0 refills | Status: AC

## 2018-08-03 ENCOUNTER — Inpatient Hospital Stay: Payer: Medicare Other | Admitting: Internal Medicine

## 2018-08-03 DIAGNOSIS — C7940 Secondary malignant neoplasm of unspecified part of nervous system: Secondary | ICD-10-CM | POA: Diagnosis not present

## 2018-08-03 DIAGNOSIS — C787 Secondary malignant neoplasm of liver and intrahepatic bile duct: Secondary | ICD-10-CM | POA: Diagnosis not present

## 2018-08-03 DIAGNOSIS — D6869 Other thrombophilia: Secondary | ICD-10-CM | POA: Diagnosis not present

## 2018-08-03 DIAGNOSIS — C7951 Secondary malignant neoplasm of bone: Secondary | ICD-10-CM | POA: Diagnosis not present

## 2018-08-03 DIAGNOSIS — C799 Secondary malignant neoplasm of unspecified site: Secondary | ICD-10-CM | POA: Diagnosis not present

## 2018-08-03 DIAGNOSIS — C3490 Malignant neoplasm of unspecified part of unspecified bronchus or lung: Secondary | ICD-10-CM | POA: Diagnosis not present

## 2018-08-04 DIAGNOSIS — C799 Secondary malignant neoplasm of unspecified site: Secondary | ICD-10-CM | POA: Diagnosis not present

## 2018-08-04 DIAGNOSIS — C3490 Malignant neoplasm of unspecified part of unspecified bronchus or lung: Secondary | ICD-10-CM | POA: Diagnosis not present

## 2018-08-04 DIAGNOSIS — D6869 Other thrombophilia: Secondary | ICD-10-CM | POA: Diagnosis not present

## 2018-08-04 DIAGNOSIS — C7940 Secondary malignant neoplasm of unspecified part of nervous system: Secondary | ICD-10-CM | POA: Diagnosis not present

## 2018-08-04 DIAGNOSIS — C787 Secondary malignant neoplasm of liver and intrahepatic bile duct: Secondary | ICD-10-CM | POA: Diagnosis not present

## 2018-08-04 DIAGNOSIS — C7951 Secondary malignant neoplasm of bone: Secondary | ICD-10-CM | POA: Diagnosis not present

## 2018-08-05 DIAGNOSIS — C7951 Secondary malignant neoplasm of bone: Secondary | ICD-10-CM | POA: Diagnosis not present

## 2018-08-05 DIAGNOSIS — C799 Secondary malignant neoplasm of unspecified site: Secondary | ICD-10-CM | POA: Diagnosis not present

## 2018-08-05 DIAGNOSIS — C7940 Secondary malignant neoplasm of unspecified part of nervous system: Secondary | ICD-10-CM | POA: Diagnosis not present

## 2018-08-05 DIAGNOSIS — C3490 Malignant neoplasm of unspecified part of unspecified bronchus or lung: Secondary | ICD-10-CM | POA: Diagnosis not present

## 2018-08-05 DIAGNOSIS — D6869 Other thrombophilia: Secondary | ICD-10-CM | POA: Diagnosis not present

## 2018-08-05 DIAGNOSIS — C787 Secondary malignant neoplasm of liver and intrahepatic bile duct: Secondary | ICD-10-CM | POA: Diagnosis not present

## 2018-08-06 DIAGNOSIS — D6869 Other thrombophilia: Secondary | ICD-10-CM | POA: Diagnosis not present

## 2018-08-06 DIAGNOSIS — C787 Secondary malignant neoplasm of liver and intrahepatic bile duct: Secondary | ICD-10-CM | POA: Diagnosis not present

## 2018-08-06 DIAGNOSIS — C3490 Malignant neoplasm of unspecified part of unspecified bronchus or lung: Secondary | ICD-10-CM | POA: Diagnosis not present

## 2018-08-06 DIAGNOSIS — C7940 Secondary malignant neoplasm of unspecified part of nervous system: Secondary | ICD-10-CM | POA: Diagnosis not present

## 2018-08-06 DIAGNOSIS — C799 Secondary malignant neoplasm of unspecified site: Secondary | ICD-10-CM | POA: Diagnosis not present

## 2018-08-06 DIAGNOSIS — C7951 Secondary malignant neoplasm of bone: Secondary | ICD-10-CM | POA: Diagnosis not present

## 2018-08-06 DIAGNOSIS — C7801 Secondary malignant neoplasm of right lung: Secondary | ICD-10-CM | POA: Diagnosis not present

## 2018-08-07 DIAGNOSIS — C787 Secondary malignant neoplasm of liver and intrahepatic bile duct: Secondary | ICD-10-CM | POA: Diagnosis not present

## 2018-08-07 DIAGNOSIS — C799 Secondary malignant neoplasm of unspecified site: Secondary | ICD-10-CM | POA: Diagnosis not present

## 2018-08-07 DIAGNOSIS — C7951 Secondary malignant neoplasm of bone: Secondary | ICD-10-CM | POA: Diagnosis not present

## 2018-08-07 DIAGNOSIS — D6869 Other thrombophilia: Secondary | ICD-10-CM | POA: Diagnosis not present

## 2018-08-07 DIAGNOSIS — C3490 Malignant neoplasm of unspecified part of unspecified bronchus or lung: Secondary | ICD-10-CM | POA: Diagnosis not present

## 2018-08-07 DIAGNOSIS — C7940 Secondary malignant neoplasm of unspecified part of nervous system: Secondary | ICD-10-CM | POA: Diagnosis not present

## 2018-08-09 ENCOUNTER — Telehealth: Payer: Self-pay | Admitting: *Deleted

## 2018-08-09 NOTE — Telephone Encounter (Signed)
Received fax notice from [facility] that patient passed away on 08/19/18 at 8:38 pm. MD notified. All future appointments cancelled.

## 2018-08-10 ENCOUNTER — Other Ambulatory Visit: Payer: Self-pay | Admitting: Diagnostic Radiology

## 2018-08-10 DIAGNOSIS — Z95828 Presence of other vascular implants and grafts: Secondary | ICD-10-CM

## 2018-08-18 ENCOUNTER — Inpatient Hospital Stay (HOSPITAL_COMMUNITY)
Admission: RE | Admit: 2018-08-18 | Payer: Medicare Other | Source: Intra-hospital | Admitting: Physical Medicine & Rehabilitation

## 2018-08-22 DEATH — deceased

## 2018-09-09 ENCOUNTER — Encounter (HOSPITAL_COMMUNITY): Payer: Self-pay | Admitting: Hematology & Oncology

## 2019-01-21 ENCOUNTER — Other Ambulatory Visit: Payer: Self-pay

## 2019-06-11 ENCOUNTER — Other Ambulatory Visit (HOSPITAL_COMMUNITY): Payer: Self-pay | Admitting: Orthopedic Surgery

## 2020-07-07 IMAGING — MR MR LUMBAR SPINE WO/W CM
4 of 7 series · 25 of 48 positions shown · IV contrast (MH)
Comparison: Lumbar spine MRI 06/04/2018

CLINICAL DATA: Status post L4 corpectomy for resection of neoplasm.
Increased low back pain.

EXAM:
MRI LUMBAR SPINE WITHOUT AND WITH CONTRAST
TECHNIQUE: Multiplanar and multiecho pulse sequences of the lumbar spine were
obtained without and with intravenous contrast.
CONTRAST:  9 mL Gadavist

[Series 5: T2 · sagittal · 4.0mm · 0.73mm/px · 6 of 20 slices shown (1 of 2)]
[im 1/20]
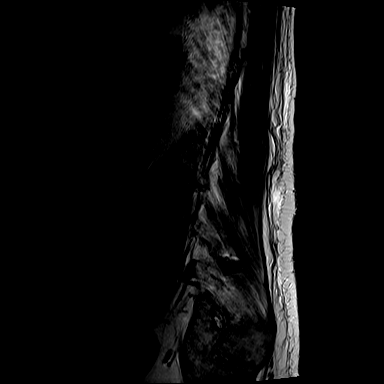
[im 4/20]
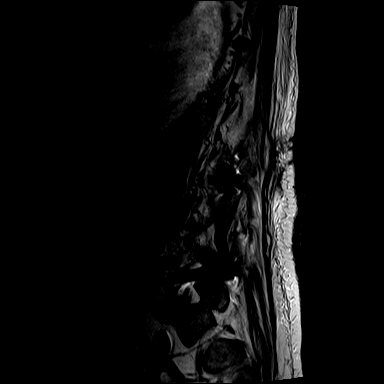
[im 8/20]
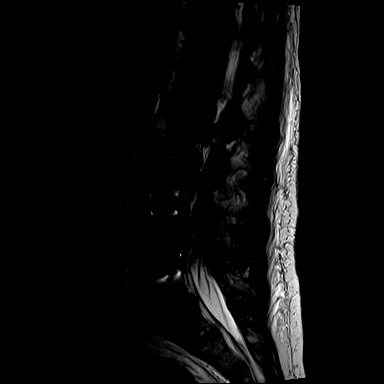
[im 12/20]
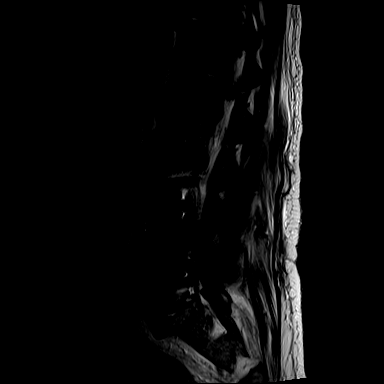
[im 16/20]
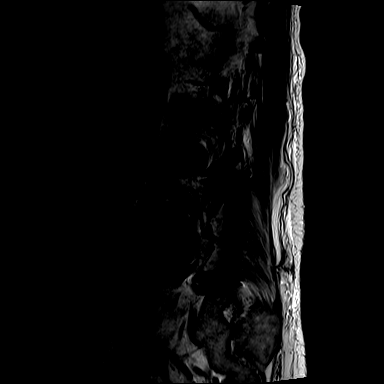
[im 20/20]
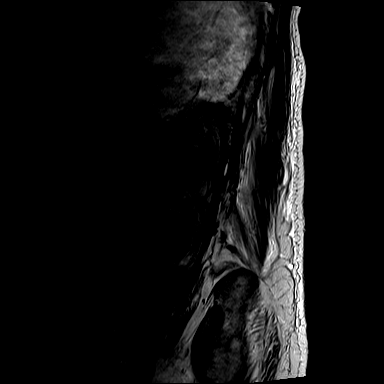

[Series 7: T1 · sagittal · 4.0mm · 0.88mm/px · 5 of 20 slices shown (1 of 2)]
[im 1/20]
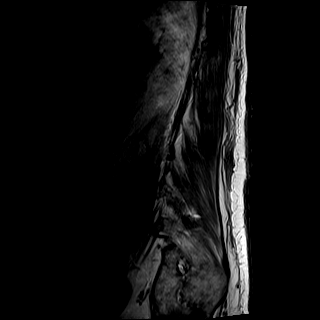
[im 5/20]
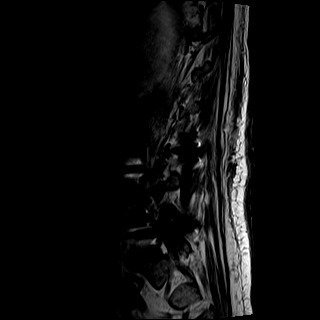
[im 10/20]
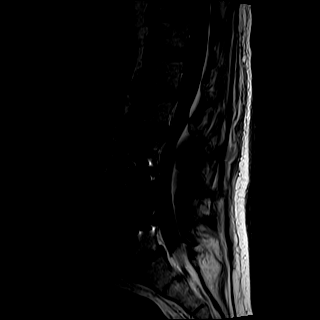
[im 15/20]
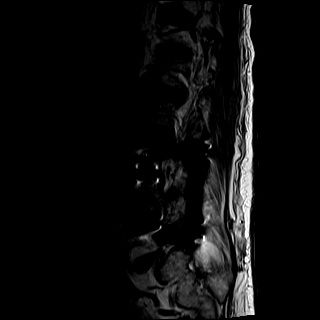
[im 20/20]
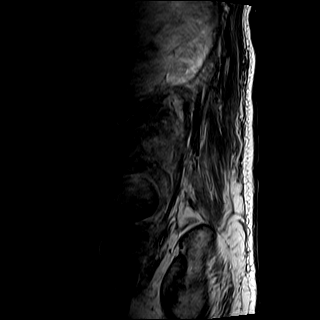

[Series 8: T2 · axial · 4.0mm · 0.57mm/px · z∈[-170,+36]mm · 9 of 33 slices shown (2 of 2)]
[im 1/33]
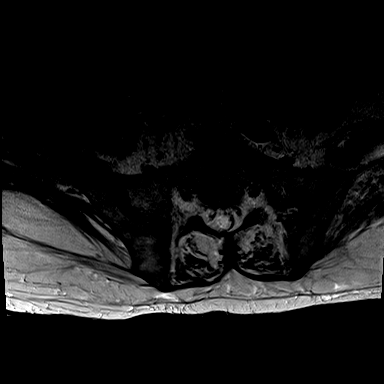
[im 5/33]
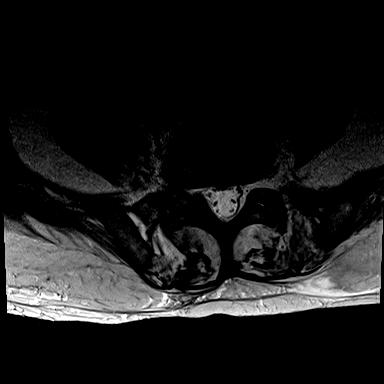
[im 9/33]
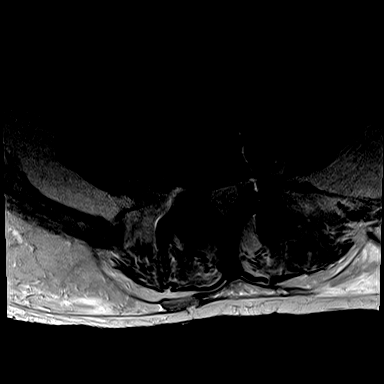
[im 13/33]
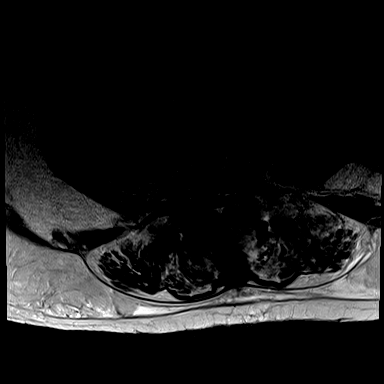
[im 17/33]
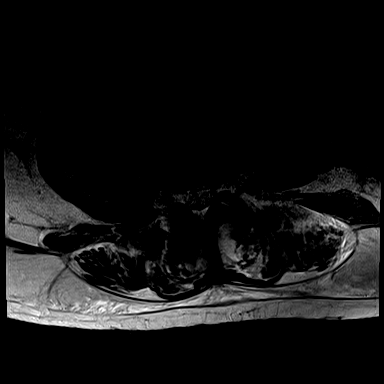
[im 21/33]
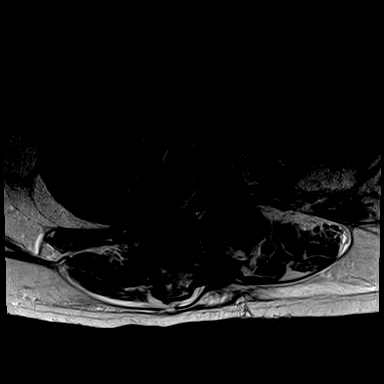
[im 25/33]
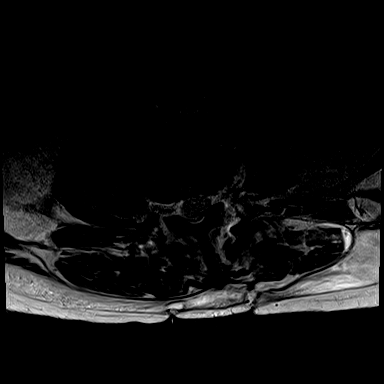
[im 29/33]
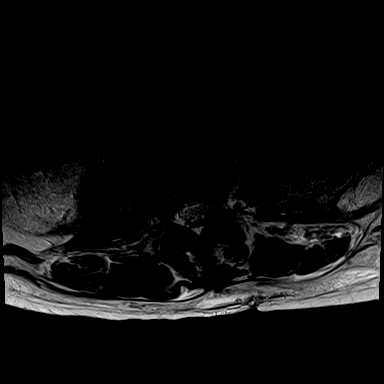
[im 33/33]
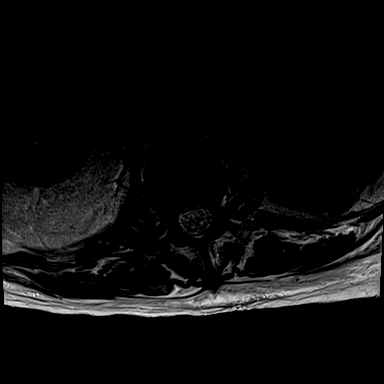

[Series 9: T1 · axial · 4.0mm · 0.34mm/px · z∈[-170,+16]mm · 5 of 33 slices shown (2 of 2)]
[im 1/33]
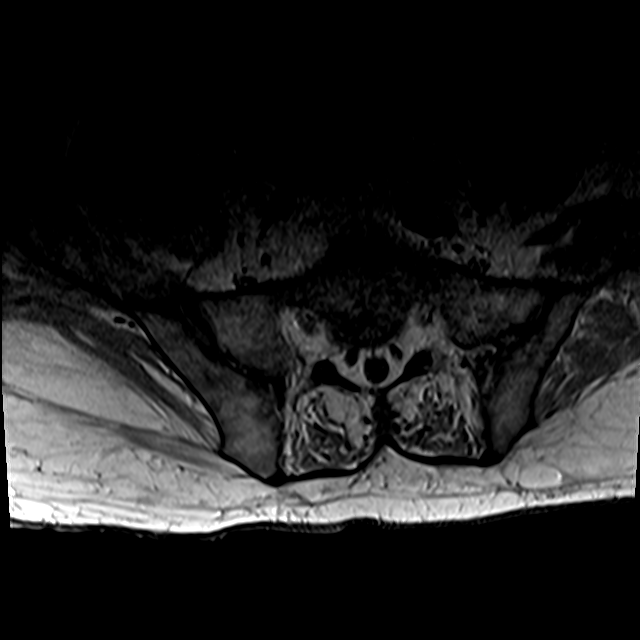
[im 5/33]
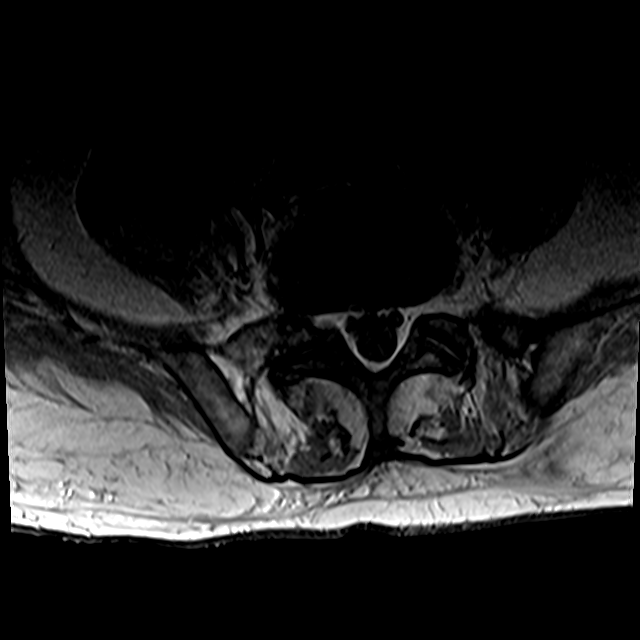
[im 9/33]
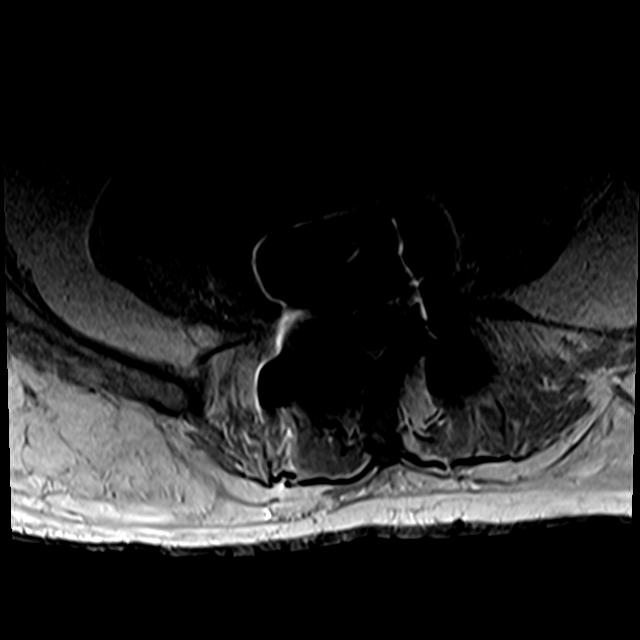
[im 17/33]
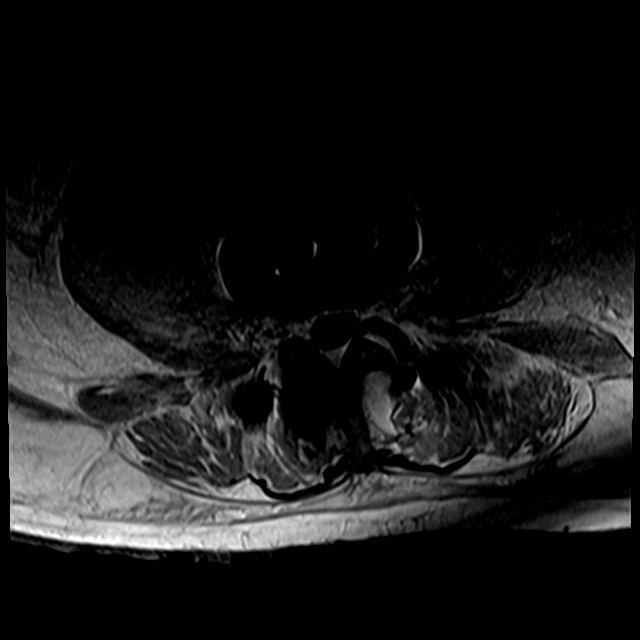
[im 29/33]
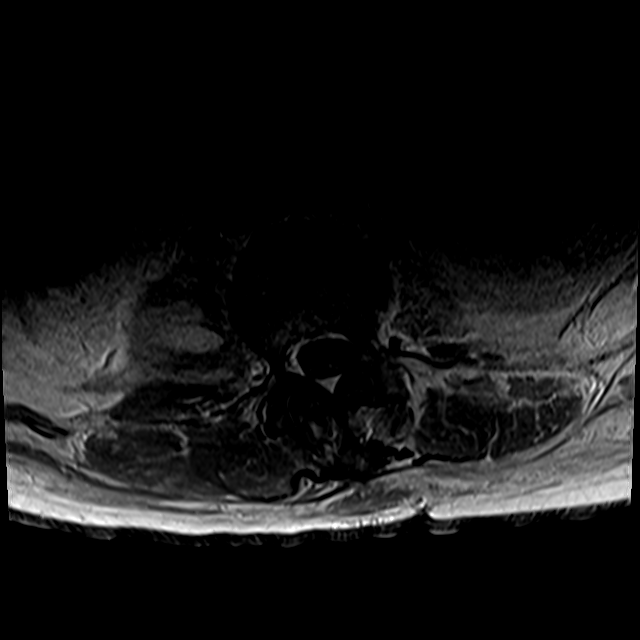

[25 of 48 positions shown; findings below may reference images not displayed]

FINDINGS: Segmentation:  Standard.

Alignment:  Physiologic.

Vertebrae: Status post L4 corpectomy and L3-L5 fusion. Bone marrow
signal at the L4 level is largely obscured by susceptibility
artifacts from the fusion/corpectomy hardware.

Conus medullaris and cauda equina: Conus extends to the level. Conus
and cauda equina appear normal.

Paraspinal and other soft tissues: The right psoas muscle is
markedly expanded at the L4 level, with abnormal internal
T1-weighted signal. No associated contrast enhancement. There is
edema of the left psoas muscle.

Disc levels:

Severe spinal canal stenosis at the L4 level is unchanged compared
to the prior study. The posterior bulging of the vertebral body is
unchanged. There is severe right foraminal stenosis. The other disc
levels are unchanged.
IMPRESSION: 1. Retroperitoneal hematoma expanding the right psoas muscle
measuring up to 8.5 x 5.7 cm.
2. Status post L4 corpectomy and L3-5 fusion with unchanged severe
spinal canal stenosis at the L4 level and severe right foraminal
narrowing.
3. Magnetic susceptibility effects from the fusion and corpectomy
hardware limit assessment of the bone marrow signal at the
postoperative levels.

These results will be called to the ordering clinician or
representative by the Radiologist Assistant, and communication
documented in the PACS or zVision Dashboard.
